# Patient Record
Sex: Male | Born: 1955 | Race: Black or African American | Hispanic: No | Marital: Married | Smoking: Never smoker
Health system: Southern US, Community
[De-identification: ages and names within clinical notes are randomized; demographics above are authoritative.]

## PROBLEM LIST (undated history)

## (undated) DIAGNOSIS — I5022 Chronic systolic (congestive) heart failure: Secondary | ICD-10-CM

## (undated) DIAGNOSIS — I219 Acute myocardial infarction, unspecified: Secondary | ICD-10-CM

## (undated) DIAGNOSIS — Q613 Polycystic kidney, unspecified: Secondary | ICD-10-CM

## (undated) DIAGNOSIS — N184 Chronic kidney disease, stage 4 (severe): Secondary | ICD-10-CM

## (undated) DIAGNOSIS — I10 Essential (primary) hypertension: Secondary | ICD-10-CM

## (undated) DIAGNOSIS — E785 Hyperlipidemia, unspecified: Secondary | ICD-10-CM

## (undated) DIAGNOSIS — F410 Panic disorder [episodic paroxysmal anxiety] without agoraphobia: Secondary | ICD-10-CM

## (undated) DIAGNOSIS — I428 Other cardiomyopathies: Secondary | ICD-10-CM

## (undated) DIAGNOSIS — N2889 Other specified disorders of kidney and ureter: Secondary | ICD-10-CM

## (undated) DIAGNOSIS — I340 Nonrheumatic mitral (valve) insufficiency: Secondary | ICD-10-CM

## (undated) DIAGNOSIS — J449 Chronic obstructive pulmonary disease, unspecified: Secondary | ICD-10-CM

## (undated) DIAGNOSIS — F419 Anxiety disorder, unspecified: Secondary | ICD-10-CM

## (undated) HISTORY — DX: Hyperlipidemia, unspecified: E78.5

## (undated) HISTORY — PX: CARDIAC CATHETERIZATION: SHX172

## (undated) HISTORY — DX: Chronic kidney disease, stage 4 (severe): N18.4

## (undated) HISTORY — DX: Chronic systolic (congestive) heart failure: I50.22

## (undated) HISTORY — DX: Chronic obstructive pulmonary disease, unspecified: J44.9

## (undated) HISTORY — DX: Polycystic kidney, unspecified: Q61.3

---

## 2005-11-06 ENCOUNTER — Emergency Department: Payer: Self-pay | Admitting: Emergency Medicine

## 2005-11-06 ENCOUNTER — Other Ambulatory Visit: Payer: Self-pay

## 2005-11-08 ENCOUNTER — Ambulatory Visit: Payer: Self-pay | Admitting: Nurse Practitioner

## 2005-11-15 ENCOUNTER — Ambulatory Visit: Payer: Self-pay | Admitting: Nurse Practitioner

## 2006-01-03 ENCOUNTER — Emergency Department: Payer: Self-pay | Admitting: Emergency Medicine

## 2006-01-03 ENCOUNTER — Other Ambulatory Visit: Payer: Self-pay

## 2006-03-01 ENCOUNTER — Other Ambulatory Visit: Payer: Self-pay

## 2006-03-01 ENCOUNTER — Emergency Department: Payer: Self-pay | Admitting: Emergency Medicine

## 2006-03-18 ENCOUNTER — Inpatient Hospital Stay: Payer: Self-pay | Admitting: Internal Medicine

## 2006-03-18 ENCOUNTER — Other Ambulatory Visit: Payer: Self-pay

## 2006-03-18 ENCOUNTER — Emergency Department: Payer: Self-pay | Admitting: Emergency Medicine

## 2006-04-23 ENCOUNTER — Other Ambulatory Visit: Payer: Self-pay

## 2006-04-23 ENCOUNTER — Emergency Department: Payer: Self-pay | Admitting: Emergency Medicine

## 2006-05-05 ENCOUNTER — Ambulatory Visit: Payer: Self-pay | Admitting: Internal Medicine

## 2006-05-05 ENCOUNTER — Inpatient Hospital Stay (HOSPITAL_COMMUNITY): Admission: EM | Admit: 2006-05-05 | Discharge: 2006-05-06 | Payer: Self-pay | Admitting: Emergency Medicine

## 2006-10-28 ENCOUNTER — Emergency Department: Payer: Self-pay | Admitting: Unknown Physician Specialty

## 2010-04-16 ENCOUNTER — Emergency Department: Payer: Self-pay | Admitting: Emergency Medicine

## 2011-03-13 ENCOUNTER — Emergency Department: Payer: Self-pay | Admitting: Internal Medicine

## 2012-08-01 ENCOUNTER — Emergency Department: Payer: Self-pay | Admitting: Unknown Physician Specialty

## 2012-08-01 LAB — BASIC METABOLIC PANEL
Anion Gap: 7 (ref 7–16)
BUN: 35 mg/dL — ABNORMAL HIGH (ref 7–18)
Chloride: 110 mmol/L — ABNORMAL HIGH (ref 98–107)
Creatinine: 1.96 mg/dL — ABNORMAL HIGH (ref 0.60–1.30)
Potassium: 4 mmol/L (ref 3.5–5.1)
Sodium: 141 mmol/L (ref 136–145)

## 2012-08-01 LAB — CBC
HCT: 43 % (ref 40.0–52.0)
MCHC: 33.1 g/dL (ref 32.0–36.0)
MCV: 87 fL (ref 80–100)
RBC: 4.94 10*6/uL (ref 4.40–5.90)
RDW: 14.5 % (ref 11.5–14.5)

## 2012-08-01 LAB — HEPATIC FUNCTION PANEL A (ARMC)
SGOT(AST): 42 U/L — ABNORMAL HIGH (ref 15–37)
SGPT (ALT): 41 U/L (ref 12–78)
Total Protein: 7 g/dL (ref 6.4–8.2)

## 2012-08-01 LAB — PRO B NATRIURETIC PEPTIDE: B-Type Natriuretic Peptide: 13255 pg/mL — ABNORMAL HIGH (ref 0–125)

## 2012-08-01 LAB — CK TOTAL AND CKMB (NOT AT ARMC)
CK, Total: 319 U/L — ABNORMAL HIGH (ref 35–232)
CK-MB: 4.9 ng/mL — ABNORMAL HIGH (ref 0.5–3.6)

## 2012-09-20 ENCOUNTER — Emergency Department: Payer: Self-pay | Admitting: Emergency Medicine

## 2012-09-20 LAB — BASIC METABOLIC PANEL
Anion Gap: 8 (ref 7–16)
Co2: 22 mmol/L (ref 21–32)
Creatinine: 2.22 mg/dL — ABNORMAL HIGH (ref 0.60–1.30)

## 2012-09-20 LAB — CBC
MCHC: 31.5 g/dL — ABNORMAL LOW (ref 32.0–36.0)
MCV: 88 fL (ref 80–100)
RBC: 5.34 10*6/uL (ref 4.40–5.90)
WBC: 7.1 10*3/uL (ref 3.8–10.6)

## 2012-09-20 LAB — CK TOTAL AND CKMB (NOT AT ARMC)
CK, Total: 213 U/L (ref 35–232)
CK-MB: 2.9 ng/mL (ref 0.5–3.6)

## 2012-09-20 LAB — TROPONIN I: Troponin-I: <0.02 ng/mL

## 2012-11-12 ENCOUNTER — Emergency Department: Payer: Self-pay | Admitting: Emergency Medicine

## 2012-11-12 LAB — COMPREHENSIVE METABOLIC PANEL
Chloride: 113 mmol/L — ABNORMAL HIGH (ref 98–107)
EGFR (Non-African Amer.): 33 — ABNORMAL LOW
Osmolality: 299 (ref 275–301)
Potassium: 4.1 mmol/L (ref 3.5–5.1)
SGPT (ALT): 25 U/L (ref 12–78)

## 2012-11-12 LAB — CBC
HCT: 44.1 % (ref 40.0–52.0)
MCHC: 32.8 g/dL (ref 32.0–36.0)
MCV: 86 fL (ref 80–100)
RDW: 14.6 % — ABNORMAL HIGH (ref 11.5–14.5)
WBC: 8 10*3/uL (ref 3.8–10.6)

## 2012-11-12 LAB — TROPONIN I: Troponin-I: 0.04 ng/mL

## 2012-11-12 LAB — CK TOTAL AND CKMB (NOT AT ARMC): CK, Total: 103 U/L (ref 35–232)

## 2013-03-03 DIAGNOSIS — F419 Anxiety disorder, unspecified: Secondary | ICD-10-CM | POA: Insufficient documentation

## 2013-03-09 ENCOUNTER — Ambulatory Visit: Payer: Self-pay | Admitting: Cardiology

## 2014-03-01 ENCOUNTER — Encounter (HOSPITAL_COMMUNITY): Payer: Self-pay | Admitting: Emergency Medicine

## 2014-03-01 ENCOUNTER — Emergency Department (HOSPITAL_COMMUNITY): Payer: BC Managed Care – PPO

## 2014-03-01 ENCOUNTER — Emergency Department (HOSPITAL_COMMUNITY)
Admission: EM | Admit: 2014-03-01 | Discharge: 2014-03-01 | Disposition: A | Discharge status: Home / Self Care | Payer: BC Managed Care – PPO | Attending: Emergency Medicine | Admitting: Emergency Medicine

## 2014-03-01 DIAGNOSIS — Z880 Allergy status to penicillin: Secondary | ICD-10-CM | POA: Insufficient documentation

## 2014-03-01 DIAGNOSIS — N289 Disorder of kidney and ureter, unspecified: Secondary | ICD-10-CM | POA: Insufficient documentation

## 2014-03-01 DIAGNOSIS — Z79899 Other long term (current) drug therapy: Secondary | ICD-10-CM | POA: Insufficient documentation

## 2014-03-01 DIAGNOSIS — Z8679 Personal history of other diseases of the circulatory system: Secondary | ICD-10-CM | POA: Insufficient documentation

## 2014-03-01 DIAGNOSIS — Z7982 Long term (current) use of aspirin: Secondary | ICD-10-CM | POA: Insufficient documentation

## 2014-03-01 DIAGNOSIS — F411 Generalized anxiety disorder: Secondary | ICD-10-CM | POA: Insufficient documentation

## 2014-03-01 DIAGNOSIS — F410 Panic disorder [episodic paroxysmal anxiety] without agoraphobia: Secondary | ICD-10-CM

## 2014-03-01 HISTORY — DX: Acute myocardial infarction, unspecified: I21.9

## 2014-03-01 LAB — CBC
HCT: 46.5 % (ref 39.0–52.0)
Hemoglobin: 15.5 g/dL (ref 13.0–17.0)
MCH: 29.8 pg (ref 26.0–34.0)
MCHC: 33.3 g/dL (ref 30.0–36.0)
MCV: 89.4 fL (ref 78.0–100.0)
Platelets: 274 10*3/uL (ref 150–400)
RBC: 5.2 MIL/uL (ref 4.22–5.81)
RDW: 13.4 % (ref 11.5–15.5)
WBC: 7.4 10*3/uL (ref 4.0–10.5)

## 2014-03-01 LAB — BASIC METABOLIC PANEL
BUN: 36 mg/dL — ABNORMAL HIGH (ref 6–23)
CO2: 29 mEq/L (ref 19–32)
Calcium: 10.1 mg/dL (ref 8.4–10.5)
Chloride: 100 mEq/L (ref 96–112)
Creatinine, Ser: 2.24 mg/dL — ABNORMAL HIGH (ref 0.50–1.35)
GFR calc Af Amer: 36 mL/min — ABNORMAL LOW (ref >90)
GFR calc non Af Amer: 31 mL/min — ABNORMAL LOW (ref >90)
Glucose, Bld: 140 mg/dL — ABNORMAL HIGH (ref 70–99)
Potassium: 5.2 mEq/L (ref 3.7–5.3)
Sodium: 145 mEq/L (ref 137–147)

## 2014-03-01 LAB — I-STAT TROPONIN, ED: Troponin i, poc: 0.01 ng/mL (ref 0.00–0.08)

## 2014-03-01 NOTE — ED Provider Notes (Signed)
CSN: [id number]     Arrival date & time 03/01/14  0902 History   First MD Initiated Contact with Patient 03/01/14 [phone]     Chief Complaint  Patient presents with  . Chest Pain     (Consider location/radiation/quality/duration/timing/severity/associated sxs/prior Treatment) HPI Patient presents to the emergency department with palpitations and fluttering in his chest.  The patient, states he has a strong history of anxiety and felt like this was like his previous anxiety attacks.  The patient, states, that he does take medications for anxiety, but felt like his anxieties been increasing over the last couple of days.  The patient, states, that he has not had any shortness of breath, nausea, vomiting, weakness, dizziness, headache, blurred vision, rash, fever, cough, exertional shortness of breath, exertional chest pain, or syncope.  The patient, states, that he feels better.  At this time and is having.  No symptoms.  He did complain of having some tingling in his lips and fingers.  Nothing seemed to make the patient's condition, better or worse.  Patient, states he felt overwhelmed at work.  Due to the fact that his coworker, who helps him was out of work.  He states that he missed the day before, due to his anxiety Past Medical History  Diagnosis Date  . MI (myocardial infarction)   . CHF (congestive heart failure)   . Renal disorder    History reviewed. No pertinent past surgical history. History reviewed. No pertinent family history. History  Substance Use Topics  . Smoking status: Never Smoker   . Smokeless tobacco: Not on file  . Alcohol Use: Yes    Review of Systems   All other systems negative except as documented in the HPI. All pertinent positives and negatives as reviewed in the HPI. Allergies  Penicillins  Home Medications   Prior to Admission medications   Medication Sig Start Date End Date Taking? Authorizing Provider  aspirin EC 81 MG tablet Take 81 mg by mouth  daily.   Yes Historical Provider, MD  enalapril (VASOTEC) 5 MG tablet Take 5 mg by mouth daily.   Yes Historical Provider, MD  furosemide (LASIX) 40 MG tablet Take 80 mg by mouth daily.   Yes Historical Provider, MD  sertraline (ZOLOFT) 25 MG tablet Take 25 mg by mouth daily.   Yes Historical Provider, MD   BP 144/87  Pulse 63  Temp(Src) 98.1 F (36.7 C) (Oral)  Resp 15  Ht 5\' 10"  (1.778 m)  Wt 140 lb (63.504 kg)  BMI 20.09 kg/m2  SpO2 95% Physical Exam  Nursing note and vitals reviewed. Constitutional: He is oriented to person, place, and time. He appears well-developed and well-nourished. No distress.  HENT:  Head: Normocephalic and atraumatic.  Mouth/Throat: Oropharynx is clear and moist.  Eyes: Pupils are equal, round, and reactive to light.  Neck: Normal range of motion. Neck supple.  Cardiovascular: Normal rate, regular rhythm and normal heart sounds.  Exam reveals no gallop and no friction rub.   No murmur heard. Pulmonary/Chest: Effort normal and breath sounds normal. No respiratory distress.  Neurological: He is alert and oriented to person, place, and time. He exhibits normal muscle tone. Coordination normal.  Skin: Skin is warm and dry. No erythema.    ED Course  Procedures (including critical care time) Labs Review Labs Reviewed  BASIC METABOLIC PANEL - Abnormal; Notable for the following:    Glucose, Bld 140 (*)    BUN 36 (*)    Creatinine, Ser 2.24 (*)  GFR calc non Af Amer 31 (*)    GFR calc Af Amer 36 (*)    All other components within normal limits  CBC  I-STAT TROPOININ, ED    Imaging Review Dg Chest 2 View  03/01/2014   CLINICAL DATA:  Panic attack  EXAM: CHEST  2 VIEW  COMPARISON:  04/28/2006  FINDINGS: Cardiomediastinal silhouette is stable. No acute infiltrate or pleural effusion. No pulmonary edema. Bony thorax is unremarkable.  IMPRESSION: No active cardiopulmonary disease.   Electronically Signed   By: Natasha MeadLiviu  Pop M.D.   On: 03/01/2014 11:17      EKG Interpretation   Date/Time:  Tuesday March 01 2014 09:05:40 EDT Ventricular Rate:  71 PR Interval:  183 QRS Duration: 104 QT Interval:  423 QTC Calculation: 460 R Axis:   61 Text Interpretation:  Sinus rhythm Probable left atrial enlargement Left  ventricular hypertrophy Anterior Q waves, possibly due to LVH Abnrm T,  consider ischemia, anterolateral lds st/t wave changes are new in  inferior/lateral leads since prior tracing Confirmed by [facility]  MD, MARTHA  [phone]) on 03/01/2014 3:32:22 PM     The patient does have a cardiac history, but I feel that these symptoms were not related to his cardiac disease.  The patient, states did not feel like his cardiac chest pain, and felt like his anxiety attacks.  The patient is advised return here as needed.  Patient is also given instructions.  Followup with his cardiologist.  He voices an understanding of the plan and all questions were answered and      Carlyle DollyChristopher W  Washabaugh, PA-C 03/03/14 1945

## 2014-03-01 NOTE — Discharge Instructions (Signed)
Return here as needed. Follow up with your doctor. °

## 2014-03-01 NOTE — ED Notes (Signed)
Pt to department via EMS- pt reports that earlier this morning while at work he started having palpitations and fluttering in his chest. Denies any pain, n/v, SOB. Hx of anxiety. Bp-126/76 Hr-76. 02-97. Cbg-123. 324 ASA, 1 nitro with some relief.

## 2014-03-01 NOTE — ED Notes (Signed)
Provider at the bedside.  

## 2014-03-03 NOTE — ED Provider Notes (Signed)
Medical screening examination/treatment/procedure(s) were performed by non-physician practitioner and as supervising physician I was immediately available for consultation/collaboration.   EKG Interpretation   Date/Time:  Tuesday March 01 2014 09:05:40 EDT Ventricular Rate:  71 PR Interval:  183 QRS Duration: 104 QT Interval:  423 QTC Calculation: 460 R Axis:   61 Text Interpretation:  Sinus rhythm Probable left atrial enlargement Left  ventricular hypertrophy Anterior Q waves, possibly due to LVH Abnrm T,  consider ischemia, anterolateral lds st/t wave changes are new in  inferior/lateral leads since prior tracing Confirmed by Karma Ganja  MD, MARTHA  [phone]) on 03/01/2014 3:32:22 PM       Ethelda Chick, MD 03/03/14 2304

## 2015-03-14 ENCOUNTER — Emergency Department
Admission: EM | Admit: 2015-03-14 | Discharge: 2015-03-14 | Disposition: A | Discharge status: Home / Self Care | Payer: No Typology Code available for payment source | Attending: Emergency Medicine | Admitting: Emergency Medicine

## 2015-03-14 ENCOUNTER — Other Ambulatory Visit: Payer: Self-pay

## 2015-03-14 ENCOUNTER — Emergency Department: Payer: No Typology Code available for payment source

## 2015-03-14 ENCOUNTER — Encounter: Payer: Self-pay | Admitting: Emergency Medicine

## 2015-03-14 DIAGNOSIS — Z7982 Long term (current) use of aspirin: Secondary | ICD-10-CM | POA: Insufficient documentation

## 2015-03-14 DIAGNOSIS — F419 Anxiety disorder, unspecified: Secondary | ICD-10-CM | POA: Diagnosis not present

## 2015-03-14 DIAGNOSIS — I10 Essential (primary) hypertension: Secondary | ICD-10-CM | POA: Insufficient documentation

## 2015-03-14 DIAGNOSIS — Z79899 Other long term (current) drug therapy: Secondary | ICD-10-CM | POA: Insufficient documentation

## 2015-03-14 DIAGNOSIS — Z880 Allergy status to penicillin: Secondary | ICD-10-CM | POA: Diagnosis not present

## 2015-03-14 DIAGNOSIS — R079 Chest pain, unspecified: Secondary | ICD-10-CM

## 2015-03-14 DIAGNOSIS — R0789 Other chest pain: Secondary | ICD-10-CM | POA: Diagnosis present

## 2015-03-14 HISTORY — DX: Essential (primary) hypertension: I10

## 2015-03-14 LAB — TROPONIN I

## 2015-03-14 LAB — CBC
HEMATOCRIT: 44 % (ref 40.0–52.0)
Hemoglobin: 14.3 g/dL (ref 13.0–18.0)
MCH: 28.4 pg (ref 26.0–34.0)
MCHC: 32.5 g/dL (ref 32.0–36.0)
MCV: 87.5 fL (ref 80.0–100.0)
Platelets: 196 10*3/uL (ref 150–440)
RBC: 5.03 MIL/uL (ref 4.40–5.90)
RDW: 14.3 % (ref 11.5–14.5)
WBC: 5.7 10*3/uL (ref 3.8–10.6)

## 2015-03-14 LAB — BASIC METABOLIC PANEL
ANION GAP: 12 (ref 5–15)
BUN: 37 mg/dL — AB (ref 6–20)
CALCIUM: 9.2 mg/dL (ref 8.9–10.3)
CHLORIDE: 97 mmol/L — AB (ref 101–111)
CO2: 28 mmol/L (ref 22–32)
Creatinine, Ser: 2.37 mg/dL — ABNORMAL HIGH (ref 0.61–1.24)
GFR, EST AFRICAN AMERICAN: 33 mL/min — AB (ref >60)
GFR, EST NON AFRICAN AMERICAN: 29 mL/min — AB (ref >60)
Glucose, Bld: 88 mg/dL (ref 65–99)
Potassium: 4.3 mmol/L (ref 3.5–5.1)
SODIUM: 137 mmol/L (ref 135–145)

## 2015-03-14 NOTE — Discharge Instructions (Signed)

## 2015-03-14 NOTE — ED Notes (Signed)
Says has numbness inleft arm and chest pain--fullness.

## 2015-03-14 NOTE — ED Provider Notes (Signed)
[facility] Emergency Department Provider Note  ____________________________________________  Time seen: on arrival   I have reviewed the triage vital signs and the nursing notes.   HISTORY  Chief Complaint Chest Pain    HPI Kelly Leonard is a 59 y.o. male who presents with complaints of chest pain. Patient notes approximately 3.5 hours pta he had right arm tingling and felt tightness in his chest. He believes this may be related to an anxiety attack as he has these frequently but he wants to be sure. The tightness resolved after a minute or two. He feels well now. No shortness of breath. No fevers/chills.      Past Medical History  Diagnosis Date  . MI (myocardial infarction)   . CHF (congestive heart failure)   . Renal disorder   . Hypertension     There are no active problems to display for this patient.   No past surgical history on file.  Current Outpatient Rx  Name  Route  Sig  Dispense  Refill  . ALPRAZolam (XANAX) 0.25 MG tablet   Oral   Take 0.25 mg by mouth daily as needed for anxiety.         . Ascorbic Acid (VITAMIN C PO)   Oral   Take 1 tablet by mouth daily.         Marland Kitchen aspirin EC 81 MG tablet   Oral   Take 81 mg by mouth daily.         . B Complex-C (B-COMPLEX WITH VITAMIN C) tablet   Oral   Take 1 tablet by mouth daily.         . Cyanocobalamin (VITAMIN B-12 PO)   Oral   Take 1 tablet by mouth daily.         . furosemide (LASIX) 40 MG tablet   Oral   Take 40 mg by mouth 2 (two) times daily.          Marland Kitchen MAGNESIUM GLUCONATE PO   Oral   Take 1 tablet by mouth daily.         . sertraline (ZOLOFT) 25 MG tablet   Oral   Take 25 mg by mouth daily.           Allergies Penicillins  No family history on file.  Social History History  Substance Use Topics  . Smoking status: Never Smoker   . Smokeless tobacco: Not on file  . Alcohol Use: Yes    Review of Systems  Constitutional: Negative  for fever. Eyes: Negative for visual changes. ENT: Negative for sore throat Cardiovascular: postitivefor chest pain. Respiratory: Negative for shortness of breath. Gastrointestinal: Negative for abdominal pain, vomiting and diarrhea. Genitourinary: Negative for dysuria. Musculoskeletal: Negative for back pain. Skin: Negative for rash. Neurological: Negative for headaches or focal weakness Psychiatric:anxiety  10-point ROS otherwise negative.  ____________________________________________   PHYSICAL EXAM:  VITAL SIGNS: ED Triage Vitals  Enc Vitals Group     BP 03/14/15 1011 145/98 mmHg     Pulse Rate 03/14/15 1011 74     Resp 03/14/15 1011 14     Temp 03/14/15 1011 98 F (36.7 C)     Temp Source 03/14/15 1011 Oral     SpO2 03/14/15 1011 99 %     Weight 03/14/15 1011 150 lb (68.04 kg)     Height --      Head Cir --      Peak Flow --      Pain Score  03/14/15 1007 5     Pain Loc --      Pain Edu? --      Excl. in GC? --      Constitutional: Alert and oriented. Well appearing and in no distress. Eyes: Conjunctivae are normal.  ENT   Head: Normocephalic and atraumatic.   Mouth/Throat: Mucous membranes are moist. Cardiovascular: Normal rate, regular rhythm. Normal and symmetric distal pulses are present in all extremities. No murmurs, rubs, or gallops. Respiratory: Normal respiratory effort without tachypnea nor retractions. Breath sounds are clear and equal bilaterally.  Gastrointestinal: Soft and non-tender in all quadrants. No distention. There is no CVA tenderness. Genitourinary: deferred Musculoskeletal: Nontender with normal range of motion in all extremities. No lower extremity tenderness nor edema. Neurologic:  Normal speech and language. No gross focal neurologic deficits are appreciated. Skin:  Skin is warm, dry and intact. No rash noted. Psychiatric: Mood and affect are normal. Patient exhibits appropriate insight and  judgment.  ____________________________________________    LABS (pertinent positives/negatives)  Labs Reviewed  BASIC METABOLIC PANEL - Abnormal; Notable for the following:    Chloride 97 (*)    BUN 37 (*)    Creatinine, Ser 2.37 (*)    GFR calc non Af Amer 29 (*)    GFR calc Af Amer 33 (*)    All other components within normal limits  TROPONIN I  CBC  TROPONIN I    ____________________________________________   EKG  ED ECG REPORT I, [name], the attending physician, personally viewed and interpreted this ECG.   Date: 03/14/2015  EKG Time: 10:12 AM  Rate: 71  Rhythm: normal sinus rhythm, 1st degree AV block  Axis: Normal axis  Intervals:none  ST&T Change: Nonspecific  Unchanged from prior EKG   ____________________________________________    RADIOLOGY I have personally reviewed any xrays that were ordered on this patient: cxr unremarkable  ____________________________________________   PROCEDURES  Procedure(s) performed: none  Critical Care performed: none  ____________________________________________   INITIAL IMPRESSION / ASSESSMENT AND PLAN / ED COURSE  Pertinent labs & imaging results that were available during my care of the patient were reviewed by me and considered in my medical decision making (see chart for details).  Patient well appearing, benign exam. Labs normal. Trop- x 2. Suspect anxiety attack as cause of discomfort. Recommend close f/u with PCP. Return precautions given.   ____________________________________________   FINAL CLINICAL IMPRESSION(S) / ED DIAGNOSES  Final diagnoses:  Chest pain, unspecified chest pain type     [name], MD 03/14/15 1630

## 2015-06-12 ENCOUNTER — Encounter
Admission: RE | Admit: 2015-06-12 | Discharge: 2015-06-12 | Disposition: A | Discharge status: Home / Self Care | Payer: No Typology Code available for payment source | Source: Ambulatory Visit | Attending: Cardiology | Admitting: Cardiology

## 2015-06-12 DIAGNOSIS — Z01818 Encounter for other preprocedural examination: Secondary | ICD-10-CM | POA: Diagnosis not present

## 2015-06-12 HISTORY — DX: Anxiety disorder, unspecified: F41.9

## 2015-06-12 LAB — DIFFERENTIAL
BASOS PCT: 1 %
Basophils Absolute: 0.1 10*3/uL (ref 0–0.1)
Eosinophils Absolute: 0.6 10*3/uL (ref 0–0.7)
Eosinophils Relative: 9 %
Lymphocytes Relative: 28 %
Lymphs Abs: 1.7 10*3/uL (ref 1.0–3.6)
MONOS PCT: 8 %
Monocytes Absolute: 0.5 10*3/uL (ref 0.2–1.0)
NEUTROS ABS: 3.4 10*3/uL (ref 1.4–6.5)
Neutrophils Relative %: 54 %

## 2015-06-12 LAB — URINALYSIS COMPLETE WITH MICROSCOPIC (ARMC ONLY)
BACTERIA UA: NONE SEEN
Bilirubin Urine: NEGATIVE
Glucose, UA: NEGATIVE mg/dL
Hgb urine dipstick: NEGATIVE
Ketones, ur: NEGATIVE mg/dL
Leukocytes, UA: NEGATIVE
Nitrite: NEGATIVE
PROTEIN: NEGATIVE mg/dL
SPECIFIC GRAVITY, URINE: 1.005 (ref 1.005–1.030)
SQUAMOUS EPITHELIAL / LPF: NONE SEEN
pH: 6 (ref 5.0–8.0)

## 2015-06-12 LAB — CBC
HCT: 48.9 % (ref 40.0–52.0)
HEMOGLOBIN: 16 g/dL (ref 13.0–18.0)
MCH: 28.7 pg (ref 26.0–34.0)
MCHC: 32.6 g/dL (ref 32.0–36.0)
MCV: 88 fL (ref 80.0–100.0)
Platelets: 265 10*3/uL (ref 150–440)
RBC: 5.56 MIL/uL (ref 4.40–5.90)
RDW: 13.8 % (ref 11.5–14.5)
WBC: 6.3 10*3/uL (ref 3.8–10.6)

## 2015-06-12 LAB — PROTIME-INR
INR: 0.94
Prothrombin Time: 12.8 seconds (ref 11.4–15.0)

## 2015-06-12 LAB — BASIC METABOLIC PANEL
Anion gap: 11 (ref 5–15)
BUN: 34 mg/dL — AB (ref 6–20)
CALCIUM: 10.1 mg/dL (ref 8.9–10.3)
CO2: 34 mmol/L — ABNORMAL HIGH (ref 22–32)
Chloride: 98 mmol/L — ABNORMAL LOW (ref 101–111)
Creatinine, Ser: 2.3 mg/dL — ABNORMAL HIGH (ref 0.61–1.24)
GFR calc Af Amer: 34 mL/min — ABNORMAL LOW (ref >60)
GFR, EST NON AFRICAN AMERICAN: 30 mL/min — AB (ref >60)
Glucose, Bld: 106 mg/dL — ABNORMAL HIGH (ref 65–99)
Potassium: 4.7 mmol/L (ref 3.5–5.1)
Sodium: 143 mmol/L (ref 135–145)

## 2015-06-12 LAB — APTT: aPTT: 28 seconds (ref 24–36)

## 2015-06-12 LAB — SURGICAL PCR SCREEN
MRSA, PCR: NEGATIVE
Staphylococcus aureus: NEGATIVE

## 2015-06-12 NOTE — Patient Instructions (Signed)
  Your procedure is scheduled on: 06/23/15 Fri  Report to Day Surgery.2nd floor medical mall To find out your arrival time please call [phone] between 1PM - 3PM on 06/22/15 Thurs Remember: Instructions that are not followed completely may result in serious medical risk, up to and including death, or upon the discretion of your surgeon and anesthesiologist your surgery may need to be rescheduled.    _x___ 1. Do not eat food or drink liquids after midnight. No gum chewing or hard candies.     __x__ 2. No Alcohol for 24 hours before or after surgery.   ____ 3. Bring all medications with you on the day of surgery if instructed.    __x__ 4. Notify your doctor if there is any change in your medical condition     (cold, fever, infections).     Do not wear jewelry, make-up, hairpins, clips or nail polish.  Do not wear lotions, powders, or perfumes. You may wear deodorant.  Do not shave 48 hours prior to surgery. Men may shave face and neck.  Do not bring valuables to the hospital.    [facility] is not responsible for any belongings or valuables.               Contacts, dentures or bridgework may not be worn into surgery.  Leave your suitcase in the car. After surgery it may be brought to your room.  For patients admitted to the hospital, discharge time is determined by your                treatment team.   Patients discharged the day of surgery will not be allowed to drive home.   Please read over the following fact sheets that you were given:      _x__ Take these medicines the morning of surgery with A SIP OF WATER:    1. ALPRAZolam (XANAX) 0.25 MG tablet  2. atenolol (TENORMIN) 25 MG tablet  3. sertraline (ZOLOFT) 25 MG tablet  4.  5.  6.  ____ Fleet Enema (as directed)   _x___ Use CHG Soap as directed  ____ Use inhalers on the day of surgery  ____ Stop metformin 2 days prior to surgery    ____ Take 1/2 of usual insulin dose the night before surgery and none on the  morning of surgery.   _x___ Stop Coumadin/Plavix/aspirin on stop aspirin 1 week before surgery  ____ Stop Anti-inflammatories on    ____ Stop supplements until after surgery.    ____ Bring C-Pap to the hospital.

## 2015-06-23 ENCOUNTER — Ambulatory Visit: Payer: No Typology Code available for payment source | Admitting: Anesthesiology

## 2015-06-23 ENCOUNTER — Observation Stay
Admission: RE | Admit: 2015-06-23 | Discharge: 2015-06-24 | Disposition: A | Discharge status: Home / Self Care | Payer: No Typology Code available for payment source | Source: Ambulatory Visit | Attending: Internal Medicine | Admitting: Internal Medicine

## 2015-06-23 ENCOUNTER — Encounter
Admission: RE | Disposition: A | Discharge status: Home / Self Care | Payer: Self-pay | Source: Ambulatory Visit | Attending: Internal Medicine

## 2015-06-23 ENCOUNTER — Ambulatory Visit: Payer: No Typology Code available for payment source

## 2015-06-23 ENCOUNTER — Encounter: Payer: Self-pay | Admitting: *Deleted

## 2015-06-23 DIAGNOSIS — I440 Atrioventricular block, first degree: Secondary | ICD-10-CM | POA: Insufficient documentation

## 2015-06-23 DIAGNOSIS — Z888 Allergy status to other drugs, medicaments and biological substances status: Secondary | ICD-10-CM | POA: Insufficient documentation

## 2015-06-23 DIAGNOSIS — I10 Essential (primary) hypertension: Secondary | ICD-10-CM | POA: Insufficient documentation

## 2015-06-23 DIAGNOSIS — Z7982 Long term (current) use of aspirin: Secondary | ICD-10-CM | POA: Diagnosis not present

## 2015-06-23 DIAGNOSIS — Z959 Presence of cardiac and vascular implant and graft, unspecified: Secondary | ICD-10-CM

## 2015-06-23 DIAGNOSIS — E785 Hyperlipidemia, unspecified: Secondary | ICD-10-CM | POA: Diagnosis not present

## 2015-06-23 DIAGNOSIS — I420 Dilated cardiomyopathy: Secondary | ICD-10-CM | POA: Diagnosis not present

## 2015-06-23 DIAGNOSIS — R0602 Shortness of breath: Secondary | ICD-10-CM | POA: Diagnosis not present

## 2015-06-23 DIAGNOSIS — Z79899 Other long term (current) drug therapy: Secondary | ICD-10-CM | POA: Diagnosis not present

## 2015-06-23 DIAGNOSIS — I5022 Chronic systolic (congestive) heart failure: Secondary | ICD-10-CM | POA: Diagnosis present

## 2015-06-23 DIAGNOSIS — I340 Nonrheumatic mitral (valve) insufficiency: Secondary | ICD-10-CM | POA: Insufficient documentation

## 2015-06-23 DIAGNOSIS — Z880 Allergy status to penicillin: Secondary | ICD-10-CM | POA: Diagnosis not present

## 2015-06-23 HISTORY — PX: IMPLANTABLE CARDIOVERTER DEFIBRILLATOR (ICD) GENERATOR CHANGE: SHX5469

## 2015-06-23 SURGERY — ICD GENERATOR CHANGE
Anesthesia: Monitor Anesthesia Care | Laterality: Right | Wound class: Clean

## 2015-06-23 MED ORDER — FENTANYL CITRATE (PF) 100 MCG/2ML IJ SOLN
INTRAMUSCULAR | Status: DC | PRN
  Administered 2015-06-23: 50 ug via INTRAVENOUS
  Administered 2015-06-23 (×2): 25 ug via INTRAVENOUS

## 2015-06-23 MED ORDER — ONDANSETRON HCL 4 MG/2ML IJ SOLN: 4.0000 mg | Freq: Once | INTRAMUSCULAR | Status: DC | PRN

## 2015-06-23 MED ORDER — CLINDAMYCIN PHOSPHATE 900 MG/50ML IV SOLN: 900.0000 mg | Freq: Once | INTRAVENOUS | Status: DC

## 2015-06-23 MED ORDER — ASPIRIN EC 81 MG PO TBEC
81.0000 mg | DELAYED_RELEASE_TABLET | Freq: Every day | ORAL | Status: DC
  Administered 2015-06-23 – 2015-06-24 (×2): 81 mg via ORAL
  Filled 2015-06-23 (×2): qty 1

## 2015-06-23 MED ORDER — PROPOFOL 500 MG/50ML IV EMUL
INTRAVENOUS | Status: DC | PRN
  Administered 2015-06-23: 50 ug/kg/min via INTRAVENOUS

## 2015-06-23 MED ORDER — NON FORMULARY: 160.0000 mg | Freq: Every morning | Status: DC

## 2015-06-23 MED ORDER — FENTANYL CITRATE (PF) 100 MCG/2ML IJ SOLN: 25.0000 ug | INTRAMUSCULAR | Status: DC | PRN

## 2015-06-23 MED ORDER — FUROSEMIDE 40 MG PO TABS
40.0000 mg | ORAL_TABLET | Freq: Two times a day (BID) | ORAL | Status: DC
  Administered 2015-06-23 – 2015-06-24 (×2): 40 mg via ORAL
  Filled 2015-06-23 (×2): qty 1

## 2015-06-23 MED ORDER — ONDANSETRON HCL 4 MG/2ML IJ SOLN: 4.0000 mg | Freq: Four times a day (QID) | INTRAMUSCULAR | Status: DC | PRN

## 2015-06-23 MED ORDER — MAGNESIUM GLUCONATE 500 MG PO TABS
250.0000 mg | ORAL_TABLET | Freq: Every day | ORAL | Status: DC
  Administered 2015-06-23: 250 mg via ORAL
  Filled 2015-06-23 (×3): qty 1

## 2015-06-23 MED ORDER — LIDOCAINE 1 % OPTIME INJ - NO CHARGE
INTRAMUSCULAR | Status: DC | PRN
  Administered 2015-06-23: 20 mL

## 2015-06-23 MED ORDER — FAMOTIDINE 20 MG PO TABS
20.0000 mg | ORAL_TABLET | Freq: Once | ORAL | Status: AC
  Administered 2015-06-23: 20 mg via ORAL

## 2015-06-23 MED ORDER — LACTATED RINGERS IV SOLN
INTRAVENOUS | Status: DC
  Administered 2015-06-23: 11:00:00 via INTRAVENOUS

## 2015-06-23 MED ORDER — HYDROCODONE-ACETAMINOPHEN 5-325 MG PO TABS
1.0000 | ORAL_TABLET | ORAL | Status: DC | PRN
  Administered 2015-06-23 – 2015-06-24 (×3): 1 via ORAL
  Filled 2015-06-23 (×3): qty 1

## 2015-06-23 MED ORDER — ADULT MULTIVITAMIN W/MINERALS CH
1.0000 | ORAL_TABLET | Freq: Every day | ORAL | Status: DC
  Administered 2015-06-23 – 2015-06-24 (×2): 1 via ORAL
  Filled 2015-06-23 (×3): qty 1

## 2015-06-23 MED ORDER — CLINDAMYCIN PHOSPHATE 900 MG/50ML IV SOLN
INTRAVENOUS | Status: AC
  Administered 2015-06-23: 900 mg via INTRAVENOUS
  Filled 2015-06-23: qty 50

## 2015-06-23 MED ORDER — SERTRALINE HCL 25 MG PO TABS
25.0000 mg | ORAL_TABLET | Freq: Every day | ORAL | Status: DC
  Administered 2015-06-24: 25 mg via ORAL
  Filled 2015-06-23: qty 1

## 2015-06-23 MED ORDER — ALPRAZOLAM 0.5 MG PO TABS: 0.2500 mg | ORAL_TABLET | Freq: Every day | ORAL | Status: DC | PRN

## 2015-06-23 MED ORDER — VANCOMYCIN HCL IN DEXTROSE 1-5 GM/200ML-% IV SOLN
1000.0000 mg | Freq: Once | INTRAVENOUS | Status: AC
  Administered 2015-06-23: 1000 mg via INTRAVENOUS

## 2015-06-23 MED ORDER — ACETAMINOPHEN 325 MG PO TABS
325.0000 mg | ORAL_TABLET | ORAL | Status: DC | PRN
  Administered 2015-06-23: 650 mg via ORAL
  Filled 2015-06-23: qty 2

## 2015-06-23 MED ORDER — SODIUM CHLORIDE 0.9 % IR SOLN
Status: DC | PRN
  Administered 2015-06-23: 60 mL

## 2015-06-23 MED ORDER — FAMOTIDINE 20 MG PO TABS
ORAL_TABLET | ORAL | Status: AC
  Administered 2015-06-23: 20 mg via ORAL
  Filled 2015-06-23: qty 1

## 2015-06-23 MED ORDER — CARVEDILOL 12.5 MG PO TABS
6.2500 mg | ORAL_TABLET | Freq: Two times a day (BID) | ORAL | Status: DC
  Administered 2015-06-23 – 2015-06-24 (×2): 6.25 mg via ORAL
  Filled 2015-06-23 (×2): qty 1

## 2015-06-23 MED ORDER — HEPARIN SODIUM (PORCINE) 1000 UNIT/ML IJ SOLN
INTRAMUSCULAR | Status: DC | PRN
  Administered 2015-06-23: 10 mL via INTRAMUSCULAR

## 2015-06-23 MED ORDER — IRBESARTAN 150 MG PO TABS: 150.0000 mg | ORAL_TABLET | Freq: Every day | ORAL | Status: DC

## 2015-06-23 MED ORDER — IRBESARTAN 150 MG PO TABS
150.0000 mg | ORAL_TABLET | Freq: Every day | ORAL | Status: DC
  Administered 2015-06-24: 150 mg via ORAL
  Filled 2015-06-23: qty 1

## 2015-06-23 MED ORDER — MIDAZOLAM HCL 2 MG/2ML IJ SOLN
INTRAMUSCULAR | Status: DC | PRN
  Administered 2015-06-23: 2 mg via INTRAVENOUS

## 2015-06-23 MED ORDER — LACTATED RINGERS IV SOLN
INTRAVENOUS | Status: DC
  Administered 2015-06-23 – 2015-06-24 (×2): via INTRAVENOUS

## 2015-06-23 MED ORDER — SODIUM CHLORIDE 0.9 % IR SOLN
Freq: Once | Status: DC
  Filled 2015-06-23: qty 2

## 2015-06-23 MED ORDER — VANCOMYCIN HCL IN DEXTROSE 1-5 GM/200ML-% IV SOLN
INTRAVENOUS | Status: AC
  Administered 2015-06-23: 1000 mg via INTRAVENOUS
  Filled 2015-06-23: qty 200

## 2015-06-23 SURGICAL SUPPLY — 46 items
BAG DECANTER FOR FLEXI CONT (MISCELLANEOUS) ×2 IMPLANT
BLADE CLIPPER SURG (BLADE) ×1 IMPLANT
CABLE SURG 12 DISP A/V CHANNEL (MISCELLANEOUS) ×1 IMPLANT
CANISTER SUCT 1200ML W/VALVE (MISCELLANEOUS) ×2 IMPLANT
CHLORAPREP W/TINT 26ML (MISCELLANEOUS) ×2 IMPLANT
COVER LIGHT HANDLE STERIS (MISCELLANEOUS) ×4 IMPLANT
COVER MAYO STAND STRL (DRAPES) ×2 IMPLANT
COVER PROBE FLX POLY STRL (MISCELLANEOUS) ×1 IMPLANT
DRAPE C-ARM XRAY 36X54 (DRAPES) ×2 IMPLANT
DRESSING TELFA 4X3 1S ST N-ADH (GAUZE/BANDAGES/DRESSINGS) ×2 IMPLANT
DRSG TEGADERM 4X4.75 (GAUZE/BANDAGES/DRESSINGS) ×2 IMPLANT
GLOVE BIO SURGEON STRL SZ7.5 (GLOVE) ×3 IMPLANT
GLOVE BIOGEL PI IND STRL 7.0 (GLOVE) IMPLANT
GLOVE BIOGEL PI INDICATOR 7.0 (GLOVE) ×1
GOWN STRL REUS W/ TWL LRG LVL3 (GOWN DISPOSABLE) ×1 IMPLANT
GOWN STRL REUS W/ TWL XL LVL3 (GOWN DISPOSABLE) ×1 IMPLANT
GOWN STRL REUS W/TWL LRG LVL3 (GOWN DISPOSABLE) ×4
GOWN STRL REUS W/TWL XL LVL3 (GOWN DISPOSABLE) ×2
HANDLE YANKAUER SUCT BULB TIP (MISCELLANEOUS) ×1 IMPLANT
ICD VISIA MRI VR DVFB1D4 (ICD Generator) IMPLANT
IMMOBILIZER SHDR LG LX 900803 (SOFTGOODS) ×2 IMPLANT
IV NS 1000ML (IV SOLUTION) ×2
IV NS 1000ML BAXH (IV SOLUTION) ×1 IMPLANT
KIT RM TURNOVER STRD PROC AR (KITS) ×2 IMPLANT
LABEL OR SOLS (LABEL) ×2 IMPLANT
LEAD SPRINT QUAT SEC 6935M-62 (Lead) ×1 IMPLANT
NDL FILTER BLUNT 18X1 1/2 (NEEDLE) ×1 IMPLANT
NDL HYPO 25X1 1.5 SAFETY (NEEDLE) ×1 IMPLANT
NDL SPNL 22GX3.5 QUINCKE BK (NEEDLE) ×1 IMPLANT
NEEDLE FILTER BLUNT 18X 1/2SAF (NEEDLE) ×1
NEEDLE FILTER BLUNT 18X1 1/2 (NEEDLE) ×1 IMPLANT
NEEDLE HYPO 25X1 1.5 SAFETY (NEEDLE) ×2 IMPLANT
NEEDLE SPNL 22GX3.5 QUINCKE BK (NEEDLE) ×2 IMPLANT
NS IRRIG 500ML POUR BTL (IV SOLUTION) ×2 IMPLANT
PACK PACE INSERTION (MISCELLANEOUS) ×2 IMPLANT
PAD GROUND ADULT SPLIT (MISCELLANEOUS) ×2 IMPLANT
PAD STATPAD (MISCELLANEOUS) ×2 IMPLANT
SUT DVC V-LOC 4-0 90 CLR P-12 (SUTURE) ×4
SUT DVC VLOC 3-0 CL 6 P-12 (SUTURE) ×2 IMPLANT
SUT SILK 0 CT 1 30 (SUTURE) ×2 IMPLANT
SUT VIC AB 3-0 PS2 18 (SUTURE) ×2 IMPLANT
SUTURE DVC V-LC4-0 90 CLR P-12 (SUTURE) ×1 IMPLANT
SYR CONTROL 10ML (SYRINGE) ×2 IMPLANT
SYRINGE 10CC LL (SYRINGE) ×2 IMPLANT
TOWEL OR 17X26 4PK STRL BLUE (TOWEL DISPOSABLE) ×2 IMPLANT
VISIA MRI VR DVFB1D4 (ICD Generator) ×2 IMPLANT

## 2015-06-23 NOTE — Anesthesia Postprocedure Evaluation (Signed)
  Anesthesia Post-op Note  Patient: Kelly Leonard  Procedure(s) Performed: Procedure(s): ICD GENERATOR  INITIAL IMPLANT (Right)  Anesthesia type:MAC  Patient location: PACU  Post pain: Pain level controlled  Post assessment: Post-op Vital signs reviewed, Patient's Cardiovascular Status Stable, Respiratory Function Stable, Patent Airway and No signs of Nausea or vomiting  Post vital signs: Reviewed and stable  Last Vitals:  Filed Vitals:   06/23/15 1345  BP: 118/82  Pulse: 47  Temp:   Resp: 12    Level of consciousness: awake, alert  and patient cooperative  Complications: No apparent anesthesia complications

## 2015-06-23 NOTE — Brief Op Note (Signed)
06/23/2015  12:07 PM   Kelly Leonard [id number]  [id number]  Preop LE:[id number] systolic HF Postop Dx same, s/p Single chamber ICD in situ   Procedure: single chamber ICD implantation with  Following the obtaining of informed consent the patient was brought to the electrophysiology laboratory in place of the fluoroscopic table in the supine position.After routine prep and drape, lidocaine was infiltrated in the prepectoral subclavicular region and an incision was made and carried down to the layer of the prepectoral fascia using electrocautery and sharp dissection. A pocket was formed similarly.  Thereafter an attention was turned to gain access to the extrathoracic left axillary vein which was accomplished without difficulty and without the aspiration of air or puncture of the artery. A 9 French sheath was placed for which was then passed a single coil  active fixation defibrillator lead, model 6935M serial number [id number]. It was  passed under fluoroscopic guidance to the right ventricular apex. In its location the bipolar R wave was 9.1 millivolts, impedance was 533 ohms, the pacing threshold was 0.4 volts at 0.8msec.  There was no diaphragmatic pacing at 10 V. The current of injury was brisk.  The lead was secured to the prepectoral fascia and then attached to a Medtronic ICD, serial number  [id number].  Through the device, the bipolar R wave was 9.3 millivolts, impedance was 437 ohms, the pacing threshold was 0.5 volts at 0.4 msec. High-voltage impedance was 84 ohms.  The pocket was copiously irrigated with antibiotic containing saline solution. Hemostasis was assured, and the device and the lead was placed in the pocket and secured to the prepectoral fascia.  The wound was closed in 2.0 vicryl and 3.0 and 4.0 v lock   layers in normal fashion. The wound was washed dried and  benzoin Steri-Strips dressing was then applied. Needle counts sponge counts and instrument counts were correct at  the end of the procedure according to the staff.   Patient tolerated the procedure without apparent complication  EBL  Minimal   Fluoro time 2.21 minutes     [name], MD 06/23/2015 12:07 PM

## 2015-06-23 NOTE — Transfer of Care (Signed)
Immediate Anesthesia Transfer of Care Note  Patient: Kelly Leonard  Procedure(s) Performed: Procedure(s): ICD GENERATOR  INITIAL IMPLANT (Right)  Patient Location: PACU  Anesthesia Type:MAC  Level of Consciousness: awake, alert , oriented and patient cooperative  Airway & Oxygen Therapy: Patient Spontanous Breathing  Post-op Assessment: Report given to RN  Post vital signs: Reviewed and stable  Last Vitals:  Filed Vitals:   06/23/15 1334  BP: 126/84  Pulse: 52  Temp: 36.1 C  Resp: 19    Complications: No apparent anesthesia complications

## 2015-06-23 NOTE — H&P (Signed)
Referring Provider:  Jamse Mead    Primary Care Provider: Chinita Pester, MD [address] [place] [address]   Patient Profile:   Kelly Leonard is a very pleasant 59 y.o. male who I was asked to see for consideration of a primary prevention ICD in a patient with EF 30%.   Problem List:  Problem List  Date Reviewed: 06-25-15      Codes Priority Class Noted - Resolved   Dilated cardiomyopathy ICD-10-CM: I42.0 ICD-9-CM: 425.4   07/28/2013 - Present   Overview Signed 07/28/2013 2:55 PM by Katharina Caper, RN    EF 30% by cath 03-09-13     Hypertension ICD-10-CM: I10 ICD-9-CM: 401.9   07/28/2013 - Present   Hyperlipidemia ICD-10-CM: I09.7 ICD-9-CM: 272.4   07/28/2013 - Present       Current Medications:   Current Medications         Current Outpatient Prescriptions  Medication Sig Dispense Refill  . ALPRAZolam (XANAX) 0.25 MG tablet Take by mouth.    Marland Kitchen aspirin 81 MG EC tablet Take 81 mg by mouth.    . carvedilol (COREG) 6.25 MG tablet Take 6.25 mg by mouth 2 (two) times daily with meals.     . FUROsemide (LASIX) 40 MG tablet Take 40 mg by mouth 2 (two) times daily.     . sertraline (ZOLOFT) 25 MG tablet Take 25 mg by mouth once daily.    . valsartan (DIOVAN) 160 MG tablet Take 160 mg by mouth once daily.     No current facility-administered medications for this visit.        Allergies:  Ace inhibitors and Penicillins  History of Present Illness   Kelly Leonard is a very pleasant 59 y.o. male with NICM. The patient has had shortness of breath, has not had chest pain, has not had near syncope, has not syncope, and has not had palpitations. The patient has had orthopnea or PND. No recent hospitalizations   Family History   Patient denies any family history of sudden cardiac death.  Social History    Social History   Social History         Social History  . Marital status: Single    Spouse name: N/A  . Number of children: N/A  . Years of education: N/A      Occupational History  . Not on file.          Social History Main Topics   . Smoking status: Never Smoker   . Smokeless tobacco: Not on file   . Alcohol use 7.2 - 8.4 oz/week    12 - 14 Standard drinks or equivalent per week     Comment: 1 to 2 everyotherday   . Drug use: No   . Sexual activity: Not on file        Other Topics Concern  . Not on file   Social History Narrative      Review of Systems   14 systems reviewed pertinent positives and negatives as noted in the HPI and below.  Physical Examinations   Vital Signs:      Visit Vitals  . BP 142/86 (BP Location: Left upper arm, Patient Position: Sitting, BP Cuff Size: Adult)  . Ht 177.8 cm (5\' 10" )  . Wt 66.4 kg (146 lb 6 oz)  . SpO2 100%  . BMI 21 kg/m2   Body mass index is 21 kg/(m^2). General appearance: He appears well, in no apparent distress. Alert and oriented  times three.   Neck: supple, no significant adenopathy, carotids upstroke normal bilaterally, no bruits   Thyroid: thyroid is normal in size without nodules or tenderness   Lungs: clear to auscultation, no wheezes, rales or rhonchi, symmetric air entry   CV: normal rate, regular rhythm, normal S1, S2, no murmurs, rubs, clicks or gallops, normal rate and regular rhythm, systolic murmur 1 /6 at lower left sternal border  Abdomen: soft, nontender, nondistended, no masses or organomegaly   Neurological: alert, oriented, normal speech, no focal findings or movement disorder noted   Musculoskeletal: full range of motion without pain, moves all extremities. Ambulates without difficulty.   Extremities: no pedal edema, no clubbing or cyanosis   ECG  Rhythm: normal sinus rhythm, rate=66 bpm, QTc=432ms. QRS=100 LVH repo  Echocardiogram  Date:  12/22/2014 ECHOCARDIOGRAPHIC MEASUREMENTS 2D DIMENSIONS AORTA       Values   Normal Range   MAIN PA     Values   Normal Range     Annulus: nm*    [2.3 - 2.9]        PA Main: nm*    [1.5 - 2.1]    Aorta Sin: nm*    [3.1 - 3.7]    RIGHT VENTRICLE   ST Junction: nm*    [2.6 - 3.2]        RV Base: 3.9 cm  [ < 4.2]    Asc.Aorta: nm*    [2.6 - 3.4]         RV Mid: nm*    [ < 3.5] LEFT VENTRICLE                    RV Length: nm*    [ < 8.6]      LVIDd: 6.3 cm  [4.2 - 5.9]    INFERIOR VENA CAVA      LVIDs: 5.5 cm               Max. IVC: nm*    [ <= 2.1]        FS: 12.0 %  [> 25]          Min. IVC: nm*       SWT: 0.77 cm  [0.6 - 1.0]          ------------------       PWT: 0.83 cm  [0.6 - 1.0]          nm* - not measured LEFT ATRIUM     LA Diam: 4.4 cm  [3.0 - 4.0]   LA A4C Area: nm*    [ < 20]    LA Volume: nm*    [18 - 58]  ___________________________________________________________________________________________ ECHOCARDIOGRAPHIC DESCRIPTIONS  AORTIC ROOT    Size:Normal Dissection:No dissection  AORTIC VALVE  Leaflets:Tricuspid        Morphology:Normal  Mobility:Fully mobile  LEFT VENTRICLE    Size:MODERATELY ENLARGED    Anterior:HYPOCONTRACTILE Contraction:SEVERE GLOBAL DECREASE   Lateral:HYPOCONTRACTILE Closest EF:25% (Estimated)       Septal:HYPOCONTRACTILE  LV Masses:No Masses          Apical:HYPOCONTRACTILE     ZOX:WRUE           Inferior:HYPOCONTRACTILE                   Posterior:HYPOCONTRACTILE Dias.FxClass:(Grade 1) relaxation abnormal, E/A reversal  MITRAL VALVE  Leaflets:Normal          Mobility:Fully  mobile Morphology:Normal  LEFT ATRIUM    Size:MILDLY ENLARGED  LA Masses:No masses  IA Septum:Normal IAS  MAIN PA    Size:Normal  PULMONIC VALVE Morphology:Normal          Mobility:Fully mobile  RIGHT VENTRICLE  RV Masses:No Masses           Size:Normal  Free Wall:Normal         Contraction:Normal  TRICUSPID VALVE  Leaflets:Normal          Mobility:Fully mobile Morphology:Normal  RIGHT ATRIUM    Size:Normal          RA Other:None   RA Mass:No masses  PERICARDIUM    Fluid:No effusion  INFERIOR VENACAVA    Size:Normal Normal respiratory collapse   ____________________________________________________________________ DOPPLER ECHO and OTHER SPECIAL PROCEDURES  Aortic:TRIVIAL AR          No AS     102.0 cm/sec peak vel     4.2 mmHg peak grad     2.0 mmHg mean grad      2.3 cm^2 by DOPPLER   Mitral:MODERATE MR          No MS                    4.4 cm^2 by DOPPLER     MV Inflow E Vel=79.0 cm/sec  MV Annulus E'Vel=4.7 cm/sec     E/E'Ratio=16.8  Tricuspid:MILD TR            No TS     286.0 cm/sec peak TR vel   42.7 mmHg peak RV pressure  Pulmonary:TRIVIAL PR          No PS     ___________________________________________________________________________________________ INTERPRETATION MODERATE-TO-SEVERE LV SYSTOLIC DYSFUNCTION WITH AN ESTIMATED EF = 25-30 % NORMAL RIGHT VENTRICULAR SYSTOLIC FUNCTION MODERATE MITRAL VALVE INSUFFICIENCY MILD TRICUSPID VALVE INSUFFICIENCY TRACE AORTIC VALVE INSUFFICIENCY NO VALVULAR STENOSIS MODERATE LV ENLARGEMENT MILD LA ENLARGEMENT Labs   Hematology No results found for: WBC, HGB, HCT, MCV, PLT  Chemistries No results found for: CREATININE, BUN, NA, K, CL, CO2  Liver Function Studies No results found  for: ALT, AST, GGT, ALKPHOS, BILITOT  Thyroid Function Studies No results found for: TSH, T3TOTAL, T4TOTAL, THYROIDAB  INR No results found for: INR  Assessment and Plan  Given the patient's EF 30%, he is at an increased risk of sudden cardiac death and will likely benefit from an ICD. I spent some time with him reviewing the potential benefits and risks of the procedure. Specifically, I informed him that the potential risks include but are not limited to infection, bleeding, pneumothorax, and cardiac perforation with or without tamponade. He verbalized understanding, and he signed the consent form. His procedure is scheduled for Oct 14. Marland Kitchen He has  been n.p.o. after midnight. We plan to implant a single-chamber single coil ICD in the left infraclavicular region, and I plan not to do DFT testing. I plan to give him vancomycin and clindamycin before his procedure ()PCN allergy.

## 2015-06-23 NOTE — Progress Notes (Signed)
Patient 's pain unrelieved with Tylenol. Dr. Juliann Pares paged 2 times with no call back. Dr. Clent Ridges was paged with a order new order for Hydrocodone 5-325  Mg 1-2 tablets every 4 hrs PRN.

## 2015-06-23 NOTE — Progress Notes (Signed)
Per P&T committee, valsartan is to be automatically subsituted with irbesartan. Per policy pt valsartan 160mg  was auto subbed for irbesartan 150mg  Kelly Leonard D Haven Pylant, Pharm.D Clinical Pharmacist

## 2015-06-23 NOTE — Plan of Care (Signed)
Problem: Phase I Progression Outcomes Goal: Other Phase I Outcomes/Goals Outcome: Completed/Met Date Met:  06/23/15 Pt is alert and oriented x 4, pt transferred from PACU for ICD placement, significant other at bedside, on room air, c/o mild pain in left shoulder but declines need for medication, good appetite, up to bathroom with one person assist, ICD booklet provided to patient, pt is likely to d/c tomorrow. Uneventful shift.

## 2015-06-23 NOTE — Anesthesia Preprocedure Evaluation (Signed)
Anesthesia Evaluation  Patient identified by MRN, date of birth, ID band Patient awake    Reviewed: Allergy & Precautions, NPO status , Patient's Chart, lab work & pertinent test results  History of Anesthesia Complications Negative for: history of anesthetic complications  Airway Mallampati: II   Neck ROM: Full    Dental  (+) Chipped, Loose   Pulmonary neg pulmonary ROS,           Cardiovascular hypertension, Pt. on medications and Pt. on home beta blockers + Past MI and +CHF       Neuro/Psych negative neurological ROS     GI/Hepatic negative GI ROS, Neg liver ROS, GERD  Poorly Controlled,  Endo/Other  negative endocrine ROS  Renal/GU Renal Insufficiency     Musculoskeletal   Abdominal   Peds  Hematology negative hematology ROS (+)   Anesthesia Other Findings   Reproductive/Obstetrics                             Anesthesia Physical Anesthesia Plan  ASA: III  Anesthesia Plan: MAC   Post-op Pain Management:    Induction: Intravenous  Airway Management Planned: Nasal Cannula  Additional Equipment:   Intra-op Plan:   Post-operative Plan:   Informed Consent: I have reviewed the patients History and Physical, chart, labs and discussed the procedure including the risks, benefits and alternatives for the proposed anesthesia with the patient or authorized representative who has indicated his/her understanding and acceptance.     Plan Discussed with:   Anesthesia Plan Comments:         Anesthesia Quick Evaluation

## 2015-06-24 ENCOUNTER — Observation Stay: Payer: No Typology Code available for payment source

## 2015-06-24 DIAGNOSIS — I5022 Chronic systolic (congestive) heart failure: Secondary | ICD-10-CM | POA: Diagnosis not present

## 2015-06-24 NOTE — Progress Notes (Signed)
EKG completed and printout placed on pt's chart

## 2015-06-24 NOTE — Progress Notes (Signed)
Discharge instructions given to patient and significant other. No further questions. Will follow up with Dr. Darrold Junker, primary cardiologist. IV and tele discontinued.

## 2015-06-30 DIAGNOSIS — Z95810 Presence of automatic (implantable) cardiac defibrillator: Secondary | ICD-10-CM | POA: Insufficient documentation

## 2015-10-13 ENCOUNTER — Ambulatory Visit: Payer: Self-pay | Admitting: Family Medicine

## 2015-12-08 ENCOUNTER — Encounter: Payer: Self-pay | Admitting: Emergency Medicine

## 2015-12-08 ENCOUNTER — Emergency Department
Admission: EM | Admit: 2015-12-08 | Discharge: 2015-12-08 | Disposition: A | Discharge status: Home / Self Care | Payer: BLUE CROSS/BLUE SHIELD | Attending: Emergency Medicine | Admitting: Emergency Medicine

## 2015-12-08 DIAGNOSIS — N189 Chronic kidney disease, unspecified: Secondary | ICD-10-CM

## 2015-12-08 DIAGNOSIS — A0811 Acute gastroenteropathy due to Norwalk agent: Secondary | ICD-10-CM | POA: Insufficient documentation

## 2015-12-08 DIAGNOSIS — R7989 Other specified abnormal findings of blood chemistry: Secondary | ICD-10-CM

## 2015-12-08 DIAGNOSIS — Z880 Allergy status to penicillin: Secondary | ICD-10-CM | POA: Insufficient documentation

## 2015-12-08 DIAGNOSIS — I129 Hypertensive chronic kidney disease with stage 1 through stage 4 chronic kidney disease, or unspecified chronic kidney disease: Secondary | ICD-10-CM | POA: Insufficient documentation

## 2015-12-08 DIAGNOSIS — R778 Other specified abnormalities of plasma proteins: Secondary | ICD-10-CM

## 2015-12-08 DIAGNOSIS — E860 Dehydration: Secondary | ICD-10-CM | POA: Diagnosis not present

## 2015-12-08 DIAGNOSIS — R1110 Vomiting, unspecified: Secondary | ICD-10-CM | POA: Diagnosis present

## 2015-12-08 LAB — CBC
HCT: 48.7 % (ref 40.0–52.0)
HEMOGLOBIN: 16.4 g/dL (ref 13.0–18.0)
MCH: 28.5 pg (ref 26.0–34.0)
MCHC: 33.6 g/dL (ref 32.0–36.0)
MCV: 84.7 fL (ref 80.0–100.0)
PLATELETS: 228 10*3/uL (ref 150–440)
RBC: 5.75 MIL/uL (ref 4.40–5.90)
RDW: 14 % (ref 11.5–14.5)
WBC: 6.1 10*3/uL (ref 3.8–10.6)

## 2015-12-08 LAB — BASIC METABOLIC PANEL
ANION GAP: 5 (ref 5–15)
Anion gap: 7 (ref 5–15)
BUN: 56 mg/dL — ABNORMAL HIGH (ref 6–20)
BUN: 53 mg/dL — ABNORMAL HIGH (ref 6–20)
CALCIUM: 9 mg/dL (ref 8.9–10.3)
CALCIUM: 7.7 mg/dL — AB (ref 8.9–10.3)
CO2: 24 mmol/L (ref 22–32)
CO2: 22 mmol/L (ref 22–32)
CREATININE: 3.37 mg/dL — AB (ref 0.61–1.24)
Chloride: 107 mmol/L (ref 101–111)
Chloride: 108 mmol/L (ref 101–111)
Creatinine, Ser: 2.89 mg/dL — ABNORMAL HIGH (ref 0.61–1.24)
GFR, EST AFRICAN AMERICAN: 21 mL/min — AB (ref >60)
GFR, EST AFRICAN AMERICAN: 26 mL/min — AB (ref >60)
GFR, EST NON AFRICAN AMERICAN: 19 mL/min — AB (ref >60)
GFR, EST NON AFRICAN AMERICAN: 22 mL/min — AB (ref >60)
Glucose, Bld: 93 mg/dL (ref 65–99)
Glucose, Bld: 84 mg/dL (ref 65–99)
POTASSIUM: 3.9 mmol/L (ref 3.5–5.1)
Potassium: 4.5 mmol/L (ref 3.5–5.1)
SODIUM: 138 mmol/L (ref 135–145)
SODIUM: 135 mmol/L (ref 135–145)

## 2015-12-08 LAB — TROPONIN I
TROPONIN I: 0.05 ng/mL — AB (ref <0.031)
TROPONIN I: 0.05 ng/mL — AB (ref <0.031)

## 2015-12-08 MED ORDER — ONDANSETRON HCL 4 MG PO TABS: 4.0000 mg | ORAL_TABLET | Freq: Every day | ORAL | Status: DC | PRN

## 2015-12-08 MED ORDER — ONDANSETRON HCL 4 MG/2ML IJ SOLN
4.0000 mg | Freq: Once | INTRAMUSCULAR | Status: AC
  Administered 2015-12-08: 4 mg via INTRAVENOUS
  Filled 2015-12-08: qty 2

## 2015-12-08 MED ORDER — LORAZEPAM 2 MG/ML IJ SOLN
1.0000 mg | Freq: Once | INTRAMUSCULAR | Status: AC
  Administered 2015-12-08: 1 mg via INTRAVENOUS
  Filled 2015-12-08: qty 1

## 2015-12-08 MED ORDER — LOPERAMIDE HCL 2 MG PO CAPS
4.0000 mg | ORAL_CAPSULE | Freq: Once | ORAL | Status: AC
  Administered 2015-12-08: 4 mg via ORAL
  Filled 2015-12-08: qty 2

## 2015-12-08 MED ORDER — LOPERAMIDE HCL 2 MG PO TABS: 2.0000 mg | ORAL_TABLET | Freq: Four times a day (QID) | ORAL | Status: DC | PRN

## 2015-12-08 MED ORDER — SODIUM CHLORIDE 0.9 % IV SOLN
Freq: Once | INTRAVENOUS | Status: AC
  Administered 2015-12-08: 11:00:00 via INTRAVENOUS

## 2015-12-08 NOTE — Discharge Instructions (Signed)
Norovirus Infection A norovirus infection is caused by exposure to a virus in a group of similar viruses (noroviruses). This type of infection causes inflammation in your stomach and intestines (gastroenteritis). Norovirus is the most common cause of gastroenteritis. It also causes food poisoning. Anyone can get a norovirus infection. It spreads very easily (contagious). You can get it from contaminated food, water, surfaces, or other people. Norovirus is found in the stool or vomit of infected people. You can spread the infection as soon as you feel sick until 2 weeks after you recover.  Symptoms usually begin within 2 days after you become infected. Most norovirus symptoms affect the digestive system. CAUSES Norovirus infection is caused by contact with norovirus. You can catch norovirus if you:  Eat or drink something contaminated with norovirus.  Touch surfaces or objects contaminated with norovirus and then put your hand in your mouth.  Have direct contact with an infected person who has symptoms.  Share food, drink, or utensils with someone with who is sick with norovirus. SIGNS AND SYMPTOMS Symptoms of norovirus may include:  Nausea.  Vomiting.  Diarrhea.  Stomach cramps.  Fever.  Chills.  Headache.  Muscle aches.  Tiredness. DIAGNOSIS Your health care provider may suspect norovirus based on your symptoms and physical exam. Your health care provider may also test a sample of your stool or vomit for the virus.  TREATMENT There is no specific treatment for norovirus. Most people get better without treatment in about 2 days. HOME CARE INSTRUCTIONS  Replace lost fluids by drinking plenty of water or rehydration fluids containing important minerals called electrolytes. This prevents dehydration. Drink enough fluid to keep your urine clear or pale yellow.  Do not prepare food for others while you are infected. Wait at least 3 days after recovering from the illness to do  that. PREVENTION   Wash your hands often, especially after using the toilet or changing a diaper.  Wash fruits and vegetables thoroughly before preparing or serving them.  Throw out any food that a sick person may have touched.  Disinfect contaminated surfaces immediately after someone in the household has been sick. Use a bleach-based household cleaner.  Immediately remove and wash soiled clothes or sheets. SEEK MEDICAL CARE IF:  Your vomiting, diarrhea, and stomach pain is getting worse.  Your symptoms of norovirus do not go away after 2-3 days. SEEK IMMEDIATE MEDICAL CARE IF:  You develop symptoms of dehydration that do not improve with fluid replacement. This may include:  Excessive sleepiness.  Lack of tears.  Dry mouth.  Dizziness when standing.  Weak pulse.   This information is not intended to replace advice given to you by your health care provider. Make sure you discuss any questions you have with your health care provider.   Document Released: 11/16/2002 Document Revised: 09/16/2014 Document Reviewed: 02/03/2014 Elsevier Interactive Patient Education 2016 Else[employer].  Chronic Kidney Disease Chronic kidney disease occurs when the kidneys are damaged over a long period. The kidneys are two organs that lie on either side of the spine between the middle of the back and the front of the abdomen. The kidneys:  Remove wastes and extra water from the blood.  Produce important hormones. These help keep bones strong, regulate blood pressure, and help create red blood cells.  Balance the fluids and chemicals in the blood and tissues. A small amount of kidney damage may not cause problems, but a large amount of damage may make it difficult or impossible for the  kidneys to work the way they should. If steps are not taken to slow down the kidney damage or stop it from getting worse, the kidneys may stop working permanently. Most of the time, chronic kidney disease does not  go away. However, it can often be controlled, and those with the disease can usually live normal lives. CAUSES The most common causes of chronic kidney disease are diabetes and high blood pressure (hypertension). Chronic kidney disease may also be caused by:  Diseases that cause the kidneys' filters to become inflamed.  Diseases that affect the immune system.  Genetic diseases.  Medicines that damage the kidneys, such as anti-inflammatory medicines.  Poisoning or exposure to toxic substances.  A reoccurring kidney or urinary infection.  A problem with urine flow. This may be caused by:  Cancer.  Kidney stones.  An enlarged prostate in males. SIGNS AND SYMPTOMS Because the kidney damage in chronic kidney disease occurs slowly, symptoms develop slowly and may not be obvious until the kidney damage becomes severe. A person may have a kidney disease for years without showing any symptoms. Symptoms can include:  Swelling (edema) of the legs, ankles, or feet.  Tiredness (lethargy).  Nausea or vomiting.  Confusion.  Problems with urination, such as:  Decreased urine production.  Frequent urination, especially at night.  Frequent accidents in children who are potty trained.  Muscle twitches and cramps.  Shortness of breath.  Weakness.  Persistent itchiness.  Loss of appetite.  Metallic taste in the mouth.  Trouble sleeping.  Slowed development in children.  Short stature in children. DIAGNOSIS Chronic kidney disease may be detected and diagnosed by tests, including blood, urine, imaging, or kidney biopsy tests. TREATMENT Most chronic kidney diseases cannot be cured. Treatment usually involves relieving symptoms and preventing or slowing the progression of the disease. Treatment may include:  A special diet. You may need to avoid alcohol and foods thatare salty and high in potassium.  Medicines. These may:  Lower blood pressure.  Relieve  anemia.  Relieve swelling.  Protect the bones. HOME CARE INSTRUCTIONS  Follow your prescribed diet. Your health care provider may instruct you to limit daily salt (sodium) and protein intake.  Take medicines only as directed by your health care provider. Do not take any new medicines (prescription, over-the-counter, or nutritional supplements) unless approved by your health care provider. Many medicines can worsen your kidney damage or need to have the dose adjusted.   Quit smoking if you smoke. Talk to your health care provider about a smoking cessation program.  Keep all follow-up visits as directed by your health care provider.  Monitor your blood pressure.  Start or continue an exercise plan.  Get immunizations as directed by your health care provider.  Take vitamin and mineral supplements as directed by your health care provider. SEEK IMMEDIATE MEDICAL CARE IF:  Your symptoms get worse or you develop new symptoms.  You develop symptoms of end-stage kidney disease. These include:  Headaches.  Abnormally dark or light skin.  Numbness in the hands or feet.  Easy bruising.  Frequent hiccups.  Menstruation stops.  You have a fever.  You have decreased urine production.  You havepain or bleeding when urinating. MAKE SURE YOU:  Understand these instructions.  Will watch your condition.  Will get help right away if you are not doing well or get worse. FOR MORE INFORMATION   American Association of Kidney Patients: ResidentialShow.is  National Kidney Foundation: www.kidney.org  American Kidney Fund: FightingMatch.com.ee  [employer] [facility]: www.lifeoptions.org and www.kidneyschool.org   This information is not intended to replace advice given to you by your health care provider. Make sure you discuss any questions you have with your health care provider.   Document Released: 06/04/2008 Document Revised: 09/16/2014 Document Reviewed:  04/24/2012 Elsevier Interactive Patient Education [employer]

## 2015-12-08 NOTE — ED Provider Notes (Signed)
[facility] Emergency Department Provider Note     Time seen: ----------------------------------------- 10:25 AM on 12/08/2015 -----------------------------------------    I have reviewed the triage vital signs and the nursing notes.   HISTORY  Chief Complaint Dizziness    HPI Kelly Leonard is a 60 y.o. male who presents to ER with vomiting for 3 days as well as diarrhea. Patient reports having a syncopal episode yesterday, EMS arrived to his house but he refused transport. Patient went to work today and felt lightheaded and dizzy and was sent to the ER for further evaluation. Patient still feels nauseous and dizzy at this time.   Past Medical History  Diagnosis Date  . MI (myocardial infarction) (HCC)   . CHF (congestive heart failure) (HCC)   . Renal disorder   . Hypertension   . Anxiety     Patient Active Problem List   Diagnosis Date Noted  . Chronic systolic heart failure (HCC) 06/23/2015  . Chronic systolic HF (heart failure) (HCC) 06/23/2015    Past Surgical History  Procedure Laterality Date  . Cardiac catheterization    . Implantable cardioverter defibrillator (icd) generator change Right 06/23/2015    Procedure: ICD GENERATOR  INITIAL IMPLANT;  Surgeon: Sharion Settler, MD;  Location: [facility] ORS;  Service: Cardiovascular;  Laterality: Right;    Allergies Penicillins  Social History Social History  Substance Use Topics  . Smoking status: Never Smoker   . Smokeless tobacco: None  . Alcohol Use: Yes    Review of Systems Constitutional: Negative for fever. Eyes: Negative for visual changes. ENT: Negative for sore throat. Cardiovascular: Negative for chest pain. Respiratory: Negative for shortness of breath. Gastrointestinal: Negative for abdominal pain, Positive for vomiting and diarrhea Genitourinary: Negative for dysuria. Musculoskeletal: Negative for back pain. Skin: Negative for rash. Neurological: Negative for  headaches, positive for weakness and dizziness  10-point ROS otherwise negative.  ____________________________________________   PHYSICAL EXAM:  VITAL SIGNS: ED Triage Vitals  Enc Vitals Group     BP 12/08/15 0838 144/95 mmHg     Pulse Rate 12/08/15 0838 97     Resp 12/08/15 0838 18     Temp 12/08/15 0838 97.9 F (36.6 C)     Temp Source 12/08/15 0838 Oral     SpO2 12/08/15 0838 100 %     Weight 12/08/15 0838 138 lb (62.596 kg)     Height 12/08/15 0838 5' (1.524 m)     Head Cir --      Peak Flow --      Pain Score --      Pain Loc --      Pain Edu? --      Excl. in GC? --     Constitutional: Alert and oriented. Well appearing and in no distress. Eyes: Conjunctivae are normal. PERRL. Normal extraocular movements. ENT   Head: Normocephalic and atraumatic.   Nose: No congestion/rhinnorhea.   Mouth/Throat: Mucous membranes are moist.   Neck: No stridor. Cardiovascular: Normal rate, regular rhythm. Normal and symmetric distal pulses are present in all extremities. No murmurs, rubs, or gallops. Respiratory: Normal respiratory effort without tachypnea nor retractions. Breath sounds are clear and equal bilaterally. No wheezes/rales/rhonchi. Gastrointestinal: Soft and nontender. No distention. No abdominal bruits.  Musculoskeletal: Nontender with normal range of motion in all extremities. No joint effusions.  No lower extremity tenderness nor edema. Neurologic:  Normal speech and language. No gross focal neurologic deficits are appreciated. Speech is normal. No gait instability. Skin:  Skin  is warm, dry and intact. No rash noted. Psychiatric: Mood and affect are normal. Speech and behavior are normal. Patient exhibits appropriate insight and judgment. ____________________________________________  EKG: Interpreted by me.Normal sinus rhythm with a rate of 96 bpm, normal PR interval, normal QRS, normal QT interval. Nonspecific ST and T wave changes,  LVH  ____________________________________________  ED COURSE:  Pertinent labs & imaging results that were available during my care of the patient were reviewed by me and considered in my medical decision making (see chart for details). Patient does not appear ill clinically, but has multiple risk factors. He will need gentle IV fluids, will check basic labs and reevaluate. ____________________________________________    LABS (pertinent positives/negatives)  Labs Reviewed  BASIC METABOLIC PANEL - Abnormal; Notable for the following:    BUN 56 (*)    Creatinine, Ser 3.37 (*)    GFR calc non Af Amer 19 (*)    GFR calc Af Amer 21 (*)    All other components within normal limits  TROPONIN I - Abnormal; Notable for the following:    Troponin I 0.05 (*)    All other components within normal limits  BASIC METABOLIC PANEL - Abnormal; Notable for the following:    BUN 53 (*)    Creatinine, Ser 2.89 (*)    Calcium 7.7 (*)    GFR calc non Af Amer 22 (*)    GFR calc Af Amer 26 (*)    All other components within normal limits  TROPONIN I - Abnormal; Notable for the following:    Troponin I 0.05 (*)    All other components within normal limits  CBC   ___________________________________________  FINAL ASSESSMENT AND PLAN  Gastroenteritis, dehydration, chronic kidney disease, elevated troponin secondary to chronic kidney disease  Plan: Patient with labs as dictated above. Patient looks well, we have rechecked his chemistries and troponin which appear Improved. He'll be discharged with antiemetics and encouraged to have close follow-up with his doctor for reevaluation. He denies any difficulty breathing or nausea this time.   Emily Filbert, MD   Emily Filbert, MD 12/08/15 1257

## 2015-12-08 NOTE — ED Notes (Signed)
Pt presents with vomiting for three days. Possible dehydration.

## 2016-11-18 ENCOUNTER — Emergency Department
Admission: EM | Admit: 2016-11-18 | Discharge: 2016-11-18 | Disposition: A | Discharge status: Home / Self Care | Payer: BLUE CROSS/BLUE SHIELD | Attending: Emergency Medicine | Admitting: Emergency Medicine

## 2016-11-18 ENCOUNTER — Encounter: Payer: Self-pay | Admitting: Emergency Medicine

## 2016-11-18 ENCOUNTER — Emergency Department: Payer: BLUE CROSS/BLUE SHIELD

## 2016-11-18 DIAGNOSIS — J069 Acute upper respiratory infection, unspecified: Secondary | ICD-10-CM

## 2016-11-18 DIAGNOSIS — I110 Hypertensive heart disease with heart failure: Secondary | ICD-10-CM | POA: Insufficient documentation

## 2016-11-18 DIAGNOSIS — Z7982 Long term (current) use of aspirin: Secondary | ICD-10-CM | POA: Diagnosis not present

## 2016-11-18 DIAGNOSIS — B9789 Other viral agents as the cause of diseases classified elsewhere: Secondary | ICD-10-CM

## 2016-11-18 DIAGNOSIS — I5022 Chronic systolic (congestive) heart failure: Secondary | ICD-10-CM | POA: Insufficient documentation

## 2016-11-18 DIAGNOSIS — Z79899 Other long term (current) drug therapy: Secondary | ICD-10-CM | POA: Insufficient documentation

## 2016-11-18 DIAGNOSIS — R05 Cough: Secondary | ICD-10-CM | POA: Diagnosis present

## 2016-11-18 LAB — COMPREHENSIVE METABOLIC PANEL
ALBUMIN: 2.6 g/dL — AB (ref 3.5–5.0)
ALT: 26 U/L (ref 17–63)
AST: 41 U/L (ref 15–41)
Alkaline Phosphatase: 132 U/L — ABNORMAL HIGH (ref 38–126)
Anion gap: 10 (ref 5–15)
BUN: 47 mg/dL — ABNORMAL HIGH (ref 6–20)
CHLORIDE: 99 mmol/L — AB (ref 101–111)
CO2: 25 mmol/L (ref 22–32)
Calcium: 8.6 mg/dL — ABNORMAL LOW (ref 8.9–10.3)
Creatinine, Ser: 2.61 mg/dL — ABNORMAL HIGH (ref 0.61–1.24)
GFR calc non Af Amer: 25 mL/min — ABNORMAL LOW (ref >60)
GFR, EST AFRICAN AMERICAN: 29 mL/min — AB (ref >60)
GLUCOSE: 110 mg/dL — AB (ref 65–99)
POTASSIUM: 4.4 mmol/L (ref 3.5–5.1)
SODIUM: 134 mmol/L — AB (ref 135–145)
Total Bilirubin: 1.1 mg/dL (ref 0.3–1.2)
Total Protein: 7.8 g/dL (ref 6.5–8.1)

## 2016-11-18 LAB — CBC
HCT: 37.2 % — ABNORMAL LOW (ref 40.0–52.0)
HEMOGLOBIN: 12.6 g/dL — AB (ref 13.0–18.0)
MCH: 28.8 pg (ref 26.0–34.0)
MCHC: 34 g/dL (ref 32.0–36.0)
MCV: 84.7 fL (ref 80.0–100.0)
Platelets: 492 10*3/uL — ABNORMAL HIGH (ref 150–440)
RBC: 4.39 MIL/uL — AB (ref 4.40–5.90)
RDW: 13.9 % (ref 11.5–14.5)
WBC: 15.5 10*3/uL — ABNORMAL HIGH (ref 3.8–10.6)

## 2016-11-18 MED ORDER — ALBUTEROL SULFATE HFA 108 (90 BASE) MCG/ACT IN AERS: 2.0000 | INHALATION_SPRAY | RESPIRATORY_TRACT | 0 refills | Status: DC | PRN

## 2016-11-18 MED ORDER — SODIUM CHLORIDE 0.9 % IV BOLUS (SEPSIS)
500.0000 mL | Freq: Once | INTRAVENOUS | Status: AC
  Administered 2016-11-18: 500 mL via INTRAVENOUS

## 2016-11-18 MED ORDER — IPRATROPIUM-ALBUTEROL 0.5-2.5 (3) MG/3ML IN SOLN
RESPIRATORY_TRACT | Status: AC
  Administered 2016-11-18: 3 mL via RESPIRATORY_TRACT
  Filled 2016-11-18: qty 3

## 2016-11-18 MED ORDER — IPRATROPIUM-ALBUTEROL 0.5-2.5 (3) MG/3ML IN SOLN
3.0000 mL | Freq: Once | RESPIRATORY_TRACT | Status: AC
  Administered 2016-11-18: 3 mL via RESPIRATORY_TRACT

## 2016-11-18 NOTE — ED Triage Notes (Signed)
Diagnosed with influenza 5 days ago, remains weak and nauseated.

## 2016-11-18 NOTE — ED Provider Notes (Signed)
[facility] Emergency Department Provider Note   ____________________________________________   I have reviewed the triage vital signs and the nursing notes.   HISTORY  Chief Complaint Cough   History limited by: Not Limited   HPI Kelly Leonard is a 61 y.o. male who presents to the emergency department today because of concerns for continued weakness and fatigue. Patient was diagnosed with influenza 6 days ago. He states that today he tried going back to work and felt very weak. He had a near syncopal episode. He has had associated shortness of breath. He has been trying to make sure that he is well-hydrated at home. He did not take Tamiflu.    Past Medical History:  Diagnosis Date  . Anxiety   . CHF (congestive heart failure) (HCC)   . Hypertension   . MI (myocardial infarction)   . Renal disorder     Patient Active Problem List   Diagnosis Date Noted  . Chronic systolic heart failure (HCC) 06/23/2015  . Chronic systolic HF (heart failure) (HCC) 06/23/2015    Past Surgical History:  Procedure Laterality Date  . CARDIAC CATHETERIZATION    . IMPLANTABLE CARDIOVERTER DEFIBRILLATOR (ICD) GENERATOR CHANGE Right 06/23/2015   Procedure: ICD GENERATOR  INITIAL IMPLANT;  Surgeon: Sharion Settler, MD;  Location: [facility] ORS;  Service: Cardiovascular;  Laterality: Right;    Prior to Admission medications   Medication Sig Start Date End Date Taking? Authorizing Provider  ALPRAZolam Prudy Feeler) 0.25 MG tablet Take 0.25 mg by mouth daily as needed for anxiety.    Historical Provider, MD  Ascorbic Acid (VITAMIN C PO) Take 1 tablet by mouth daily.    Historical Provider, MD  aspirin EC 81 MG tablet Take 81 mg by mouth daily.    Historical Provider, MD  B Complex-C (B-COMPLEX WITH VITAMIN C) tablet Take 1 tablet by mouth daily.    Historical Provider, MD  carvedilol (COREG) 6.25 MG tablet Take 6.25 mg by mouth 2 (two) times daily with a meal.    Historical Provider,  MD  Cyanocobalamin (VITAMIN B-12 PO) Take 1 tablet by mouth daily.    Historical Provider, MD  furosemide (LASIX) 40 MG tablet Take 40 mg by mouth 2 (two) times daily.     Historical Provider, MD  loperamide (IMODIUM A-D) 2 MG tablet Take 1 tablet (2 mg total) by mouth 4 (four) times daily as needed for diarrhea or loose stools. 12/08/15   Emily Filbert, MD  MAGNESIUM GLUCONATE PO Take 1 tablet by mouth daily.    Historical Provider, MD  Multiple Vitamin (MULTIVITAMIN) tablet Take 1 tablet by mouth daily.    Historical Provider, MD  ondansetron (ZOFRAN) 4 MG tablet Take 1 tablet (4 mg total) by mouth daily as needed for nausea or vomiting. 12/08/15   Emily Filbert, MD  sertraline (ZOLOFT) 25 MG tablet Take 25 mg by mouth daily.    Historical Provider, MD    Allergies Penicillins  No family history on file.  Social History Social History  Substance Use Topics  . Smoking status: Never Smoker  . Smokeless tobacco: Not on file  . Alcohol use Yes    Review of Systems  Constitutional: Negative for fever. Cardiovascular: Negative for chest pain. Respiratory: Positive for shortness of breath. Gastrointestinal: Negative for abdominal pain, vomiting and diarrhea. Genitourinary: Negative for dysuria. Musculoskeletal: Negative for back pain. Skin: Negative for rash. Neurological: Negative for headaches, focal weakness or numbness.  10-point ROS otherwise negative.  ____________________________________________  PHYSICAL EXAM:  VITAL SIGNS: ED Triage Vitals  Enc Vitals Group     BP 11/18/16 1307 114/76     Pulse Rate 11/18/16 1307 92     Resp 11/18/16 1307 16     Temp 11/18/16 1307 97.6 F (36.4 C)     Temp Source 11/18/16 1307 Oral     SpO2 11/18/16 1307 100 %     Weight 11/18/16 1310 146 lb (66.2 kg)     Height 11/18/16 1310 5\' 10"  (1.778 m)     Head Circumference --      Peak Flow --      Pain Score 11/18/16 1316 10   Constitutional: Alert and oriented. Well  appearing and in no distress. Eyes: Conjunctivae are normal. Normal extraocular movements. ENT   Head: Normocephalic and atraumatic.   Nose: No congestion/rhinnorhea.   Mouth/Throat: Mucous membranes are moist.   Neck: No stridor. Hematological/Lymphatic/Immunilogical: No cervical lymphadenopathy. Cardiovascular: Normal rate, regular rhythm.  No murmurs, rubs, or gallops. Respiratory: Normal respiratory effort without tachypnea nor retractions. Breath sounds are clear and equal bilaterally. No wheezes/rales/rhonchi. Gastrointestinal: Soft and non tender. No rebound. No guarding.  Genitourinary: Deferred Musculoskeletal: Normal range of motion in all extremities. No lower extremity edema. Neurologic:  Normal speech and language. No gross focal neurologic deficits are appreciated.  Skin:  Skin is warm, dry and intact. No rash noted. Psychiatric: Mood and affect are normal. Speech and behavior are normal. Patient exhibits appropriate insight and judgment.  ____________________________________________    LABS (pertinent positives/negatives)  Labs Reviewed  COMPREHENSIVE METABOLIC PANEL - Abnormal; Notable for the following:       Result Value   Sodium 134 (*)    Chloride 99 (*)    Glucose, Bld 110 (*)    BUN 47 (*)    Creatinine, Ser 2.61 (*)    Calcium 8.6 (*)    Albumin 2.6 (*)    Alkaline Phosphatase 132 (*)    GFR calc non Af Amer 25 (*)    GFR calc Af Amer 29 (*)    All other components within normal limits  CBC - Abnormal; Notable for the following:    WBC 15.5 (*)    RBC 4.39 (*)    Hemoglobin 12.6 (*)    HCT 37.2 (*)    Platelets 492 (*)    All other components within normal limits     ____________________________________________   EKG  None  ____________________________________________    RADIOLOGY  CXr IMPRESSION:  No active disease. Pacemaker/ AICD.      ____________________________________________   PROCEDURES  Procedures  ____________________________________________   INITIAL IMPRESSION / ASSESSMENT AND PLAN / ED COURSE  Pertinent labs & imaging results that were available during my care of the patient were reviewed by me and considered in my medical decision making (see chart for details).  Patient presented to the emergency department today with continued weakness and fatigue and shortness breath after diagnosis of flu 6 days ago. Patient was given a DuoNeb which did help his symptoms of shortness of breath. Patient since her without any concerning findings of pneumonia. Patient with mild leukocytosis on blood work which likely secondary to flu. Will plan on giving the patient albuterol inhaler. Discussed expected course of influenza.  ____________________________________________   FINAL CLINICAL IMPRESSION(S) / ED DIAGNOSES  Final diagnoses:  Viral URI with cough     Note: This dictation was prepared with Dragon dictation. Any transcriptional errors that result from this process are unintentional  \  Phineas Semen, MD 11/18/16 [phone]

## 2016-11-18 NOTE — Discharge Instructions (Signed)
Please seek medical attention for any high fevers, chest pain, shortness of breath, change in behavior, persistent vomiting, bloody stool or any other new or concerning symptoms.  

## 2017-01-21 DIAGNOSIS — E785 Hyperlipidemia, unspecified: Secondary | ICD-10-CM | POA: Insufficient documentation

## 2017-01-21 DIAGNOSIS — N184 Chronic kidney disease, stage 4 (severe): Secondary | ICD-10-CM | POA: Insufficient documentation

## 2017-01-21 DIAGNOSIS — I5022 Chronic systolic (congestive) heart failure: Secondary | ICD-10-CM | POA: Insufficient documentation

## 2017-01-21 DIAGNOSIS — I420 Dilated cardiomyopathy: Secondary | ICD-10-CM | POA: Insufficient documentation

## 2017-01-21 DIAGNOSIS — Q613 Polycystic kidney, unspecified: Secondary | ICD-10-CM | POA: Insufficient documentation

## 2017-01-21 DIAGNOSIS — I10 Essential (primary) hypertension: Secondary | ICD-10-CM | POA: Insufficient documentation

## 2017-02-12 ENCOUNTER — Encounter: Payer: Self-pay | Admitting: Unknown Physician Specialty

## 2017-02-12 ENCOUNTER — Ambulatory Visit (INDEPENDENT_AMBULATORY_CARE_PROVIDER_SITE_OTHER): Payer: BLUE CROSS/BLUE SHIELD | Admitting: Unknown Physician Specialty

## 2017-02-12 VITALS — BP 138/78 | HR 67 | Temp 98.4°F | Ht 68.5 in | Wt 136.0 lb

## 2017-02-12 DIAGNOSIS — Z7689 Persons encountering health services in other specified circumstances: Secondary | ICD-10-CM

## 2017-02-12 DIAGNOSIS — D72829 Elevated white blood cell count, unspecified: Secondary | ICD-10-CM | POA: Diagnosis not present

## 2017-02-12 DIAGNOSIS — I5022 Chronic systolic (congestive) heart failure: Secondary | ICD-10-CM

## 2017-02-12 DIAGNOSIS — Z0000 Encounter for general adult medical examination without abnormal findings: Secondary | ICD-10-CM

## 2017-02-12 DIAGNOSIS — N184 Chronic kidney disease, stage 4 (severe): Secondary | ICD-10-CM

## 2017-02-12 DIAGNOSIS — I10 Essential (primary) hypertension: Secondary | ICD-10-CM

## 2017-02-12 MED ORDER — FUROSEMIDE 40 MG PO TABS: 80.0000 mg | ORAL_TABLET | Freq: Every day | ORAL | 3 refills | Status: DC

## 2017-02-12 NOTE — Assessment & Plan Note (Signed)
Refer to [facility] cardiology

## 2017-02-12 NOTE — Progress Notes (Signed)
BP 138/78   Pulse 67   Temp 98.4 F (36.9 C)   Ht 5' 8.5" (1.74 m)   Wt 136 lb (61.7 kg)   SpO2 100%   BMI 20.38 kg/m    Subjective:    Patient ID: Kelly Leonard, male    DOB: [birth year], 61 y.o.   MRN: [id number]  HPI: Kelly Leonard is a 61 y.o. male  Chief Complaint  Patient presents with  . Establish Care   Cardiomypathy Sees Dr. Cassie Freer with last visit around February.  He would like to have a different cardiologist and has had some insurance changes.    CKD Needs a new nephrologist due to insurance changes.    Depression/anxiety Happens on occassion Depression screen PHQ 2/9 02/12/2017  Decreased Interest 0  Down, Depressed, Hopeless 0  PHQ - 2 Score 0   Family History  Problem Relation Age of Onset  . Diabetes Mother   . Hypertension Brother   . Heart disease Maternal Grandmother    Social History   Social History  . Marital status: Single    Spouse name: N/A  . Number of children: N/A  . Years of education: N/A   Occupational History  . Not on file.   Social History Main Topics  . Smoking status: Never Smoker  . Smokeless tobacco: Never Used  . Alcohol use No  . Drug use: No  . Sexual activity: Yes   Other Topics Concern  . Not on file   Social History Narrative  . No narrative on file   Past Medical History:  Diagnosis Date  . Anxiety   . CHF (congestive heart failure) (HCC)   . Chronic systolic congestive heart failure (HCC)   . CKD (chronic kidney disease) stage 4, GFR 15-29 ml/min (HCC)   . COPD (chronic obstructive pulmonary disease) (HCC)   . Dilated cardiomyopathy (HCC)   . Hyperlipidemia   . Hypertension   . MI (myocardial infarction) (HCC)   . Polycystic kidney   . Renal disorder    Past Surgical History:  Procedure Laterality Date  . CARDIAC CATHETERIZATION    . IMPLANTABLE CARDIOVERTER DEFIBRILLATOR (ICD) GENERATOR CHANGE Right 06/23/2015   Procedure: ICD GENERATOR  INITIAL IMPLANT;  Surgeon: Sharion Settler,  MD;  Location: [facility] ORS;  Service: Cardiovascular;  Laterality: Right;     Relevant past medical, surgical, family and social history reviewed and updated as indicated. Interim medical history since our last visit reviewed. Allergies and medications reviewed and updated.  Review of Systems  Constitutional: Negative.   HENT: Negative.   Eyes: Negative.   Respiratory: Negative.   Cardiovascular: Negative.   Gastrointestinal: Negative.   Endocrine: Negative.   Genitourinary: Negative.   Skin: Negative.   Allergic/Immunologic: Negative.   Neurological: Negative.   Hematological: Negative.   Psychiatric/Behavioral: Negative.     Per HPI unless specifically indicated above     Objective:    BP 138/78   Pulse 67   Temp 98.4 F (36.9 C)   Ht 5' 8.5" (1.74 m)   Wt 136 lb (61.7 kg)   SpO2 100%   BMI 20.38 kg/m   Wt Readings from Last 3 Encounters:  02/12/17 136 lb (61.7 kg)  11/18/16 146 lb (66.2 kg)  12/08/15 138 lb (62.6 kg)    Physical Exam  Constitutional: He is oriented to person, place, and time. He appears well-developed and well-nourished. No distress.  HENT:  Head: Normocephalic and atraumatic.  Eyes: Conjunctivae and lids are normal.  Right eye exhibits no discharge. Left eye exhibits no discharge. No scleral icterus.  Neck: Normal range of motion. Neck supple. No JVD present. Carotid bruit is not present.  Cardiovascular: Normal rate, regular rhythm and normal heart sounds.   Pulmonary/Chest: Effort normal and breath sounds normal. No respiratory distress.  Abdominal: Normal appearance. There is no splenomegaly or hepatomegaly.  Musculoskeletal: Normal range of motion.  Neurological: He is alert and oriented to person, place, and time.  Skin: Skin is warm, dry and intact. No rash noted. No pallor.  Psychiatric: He has a normal mood and affect. His behavior is normal. Judgment and thought content normal.    Results for orders placed or performed during the  hospital encounter of 11/18/16  Comprehensive metabolic panel  Result Value Ref Range   Sodium 134 (L) 135 - 145 mmol/L   Potassium 4.4 3.5 - 5.1 mmol/L   Chloride 99 (L) 101 - 111 mmol/L   CO2 25 22 - 32 mmol/L   Glucose, Bld 110 (H) 65 - 99 mg/dL   BUN 47 (H) 6 - 20 mg/dL   Creatinine, Ser 5.78 (H) 0.61 - 1.24 mg/dL   Calcium 8.6 (L) 8.9 - 10.3 mg/dL   Total Protein 7.8 6.5 - 8.1 g/dL   Albumin 2.6 (L) 3.5 - 5.0 g/dL   AST 41 15 - 41 U/L   ALT 26 17 - 63 U/L   Alkaline Phosphatase 132 (H) 38 - 126 U/L   Total Bilirubin 1.1 0.3 - 1.2 mg/dL   GFR calc non Af Amer 25 (L) >60 mL/min   GFR calc Af Amer 29 (L) >60 mL/min   Anion gap 10 5 - 15  CBC  Result Value Ref Range   WBC 15.5 (H) 3.8 - 10.6 K/uL   RBC 4.39 (L) 4.40 - 5.90 MIL/uL   Hemoglobin 12.6 (L) 13.0 - 18.0 g/dL   HCT 46.9 (L) 62.9 - 52.8 %   MCV 84.7 80.0 - 100.0 fL   MCH 28.8 26.0 - 34.0 pg   MCHC 34.0 32.0 - 36.0 g/dL   RDW 41.3 24.4 - 01.0 %   Platelets 492 (H) 150 - 440 K/uL      Assessment & Plan:   Problem List Items Addressed This Visit      Unprioritized   Chronic systolic heart failure (HCC)    Refer to [facility] cardiology      Relevant Medications   valsartan (DIOVAN) 160 MG tablet   furosemide (LASIX) 40 MG tablet   Other Relevant Orders   Ambulatory referral to Cardiology   Comprehensive metabolic panel   TSH   CKD (chronic kidney disease) stage 4, GFR 15-29 ml/min (HCC)    Refer to nephrology      Relevant Orders   Ambulatory referral to Nephrology   Comprehensive metabolic panel   Encounter to establish care   Hypertension   Relevant Medications   valsartan (DIOVAN) 160 MG tablet   furosemide (LASIX) 40 MG tablet   Other Relevant Orders   Lipid Panel w/o Chol/HDL Ratio    Other Visit Diagnoses    Leukocytosis, unspecified type    -  Primary   Relevant Orders   CBC with Differential/Platelet   Routine general medical examination at a health care facility       Relevant Orders    Hepatitis C antibody   HIV antibody   Cologuard       Follow up plan: Return for physical. in about 3 months

## 2017-02-12 NOTE — Assessment & Plan Note (Signed)
Refer to nephrology 

## 2017-02-13 ENCOUNTER — Encounter: Payer: Self-pay | Admitting: Unknown Physician Specialty

## 2017-02-13 LAB — CBC WITH DIFFERENTIAL/PLATELET
Basophils Absolute: 0 10*3/uL (ref 0.0–0.2)
Basos: 1 %
EOS (ABSOLUTE): 0.2 10*3/uL (ref 0.0–0.4)
EOS: 3 %
HEMATOCRIT: 42.9 % (ref 37.5–51.0)
Hemoglobin: 13.2 g/dL (ref 13.0–17.7)
Immature Grans (Abs): 0 10*3/uL (ref 0.0–0.1)
Immature Granulocytes: 0 %
LYMPHS ABS: 2.3 10*3/uL (ref 0.7–3.1)
Lymphs: 34 %
MCH: 27.4 pg (ref 26.6–33.0)
MCHC: 30.8 g/dL — AB (ref 31.5–35.7)
MCV: 89 fL (ref 79–97)
MONOS ABS: 0.5 10*3/uL (ref 0.1–0.9)
Monocytes: 7 %
Neutrophils Absolute: 3.9 10*3/uL (ref 1.4–7.0)
Neutrophils: 55 %
Platelets: 284 10*3/uL (ref 150–379)
RBC: 4.81 x10E6/uL (ref 4.14–5.80)
RDW: 16.3 % — AB (ref 12.3–15.4)
WBC: 6.9 10*3/uL (ref 3.4–10.8)

## 2017-02-13 LAB — COMPREHENSIVE METABOLIC PANEL
ALK PHOS: 66 IU/L (ref 39–117)
ALT: 18 IU/L (ref 0–44)
AST: 26 IU/L (ref 0–40)
Albumin/Globulin Ratio: 1.3 (ref 1.2–2.2)
Albumin: 4.6 g/dL (ref 3.6–4.8)
BUN/Creatinine Ratio: 13 (ref 10–24)
BUN: 35 mg/dL — ABNORMAL HIGH (ref 8–27)
Bilirubin Total: 0.6 mg/dL (ref 0.0–1.2)
CO2: 20 mmol/L (ref 18–29)
Calcium: 10.1 mg/dL (ref 8.6–10.2)
Chloride: 100 mmol/L (ref 96–106)
Creatinine, Ser: 2.65 mg/dL — ABNORMAL HIGH (ref 0.76–1.27)
GFR calc Af Amer: 29 mL/min/{1.73_m2} — ABNORMAL LOW (ref >59)
GFR calc non Af Amer: 25 mL/min/{1.73_m2} — ABNORMAL LOW (ref >59)
GLOBULIN, TOTAL: 3.5 g/dL (ref 1.5–4.5)
Glucose: 85 mg/dL (ref 65–99)
POTASSIUM: 5.1 mmol/L (ref 3.5–5.2)
SODIUM: 142 mmol/L (ref 134–144)
Total Protein: 8.1 g/dL (ref 6.0–8.5)

## 2017-02-13 LAB — LIPID PANEL W/O CHOL/HDL RATIO
CHOLESTEROL TOTAL: 201 mg/dL — AB (ref 100–199)
HDL: 63 mg/dL (ref >39)
LDL Calculated: 120 mg/dL — ABNORMAL HIGH (ref 0–99)
Triglycerides: 88 mg/dL (ref 0–149)
VLDL CHOLESTEROL CAL: 18 mg/dL (ref 5–40)

## 2017-02-13 LAB — HEPATITIS C ANTIBODY: Hep C Virus Ab: >11 s/co ratio — ABNORMAL HIGH (ref 0.0–0.9)

## 2017-02-13 LAB — TSH: TSH: 2.79 u[IU]/mL (ref 0.450–4.500)

## 2017-02-13 LAB — HIV ANTIBODY (ROUTINE TESTING W REFLEX): HIV Screen 4th Generation wRfx: NONREACTIVE

## 2017-02-14 ENCOUNTER — Telehealth: Payer: Self-pay | Admitting: Unknown Physician Specialty

## 2017-02-14 NOTE — Telephone Encounter (Signed)
Called to discuss Hep C.  Unable to reach pt

## 2017-02-17 ENCOUNTER — Telehealth: Payer: Self-pay | Admitting: Unknown Physician Specialty

## 2017-02-17 DIAGNOSIS — R768 Other specified abnormal immunological findings in serum: Secondary | ICD-10-CM

## 2017-02-17 NOTE — Telephone Encounter (Signed)
Talked to wife about positive Hep C antibody.  Refer to GI

## 2017-02-28 LAB — COLOGUARD: Cologuard: NEGATIVE

## 2017-03-07 ENCOUNTER — Encounter: Payer: Self-pay | Admitting: Family Medicine

## 2017-03-15 ENCOUNTER — Emergency Department
Admission: EM | Admit: 2017-03-15 | Discharge: 2017-03-15 | Disposition: A | Discharge status: Home / Self Care | Payer: BLUE CROSS/BLUE SHIELD | Attending: Emergency Medicine | Admitting: Emergency Medicine

## 2017-03-15 ENCOUNTER — Encounter: Payer: Self-pay | Admitting: Emergency Medicine

## 2017-03-15 ENCOUNTER — Emergency Department: Payer: BLUE CROSS/BLUE SHIELD

## 2017-03-15 DIAGNOSIS — N184 Chronic kidney disease, stage 4 (severe): Secondary | ICD-10-CM | POA: Diagnosis not present

## 2017-03-15 DIAGNOSIS — J449 Chronic obstructive pulmonary disease, unspecified: Secondary | ICD-10-CM | POA: Insufficient documentation

## 2017-03-15 DIAGNOSIS — Z7982 Long term (current) use of aspirin: Secondary | ICD-10-CM | POA: Diagnosis not present

## 2017-03-15 DIAGNOSIS — I5022 Chronic systolic (congestive) heart failure: Secondary | ICD-10-CM | POA: Diagnosis not present

## 2017-03-15 DIAGNOSIS — Z79899 Other long term (current) drug therapy: Secondary | ICD-10-CM | POA: Diagnosis not present

## 2017-03-15 DIAGNOSIS — N189 Chronic kidney disease, unspecified: Secondary | ICD-10-CM

## 2017-03-15 DIAGNOSIS — I10 Essential (primary) hypertension: Secondary | ICD-10-CM

## 2017-03-15 DIAGNOSIS — I130 Hypertensive heart and chronic kidney disease with heart failure and stage 1 through stage 4 chronic kidney disease, or unspecified chronic kidney disease: Secondary | ICD-10-CM | POA: Diagnosis not present

## 2017-03-15 DIAGNOSIS — R0602 Shortness of breath: Secondary | ICD-10-CM | POA: Diagnosis present

## 2017-03-15 DIAGNOSIS — N179 Acute kidney failure, unspecified: Secondary | ICD-10-CM

## 2017-03-15 DIAGNOSIS — I509 Heart failure, unspecified: Secondary | ICD-10-CM

## 2017-03-15 LAB — COMPREHENSIVE METABOLIC PANEL
ALT: 27 U/L (ref 17–63)
ANION GAP: 9 (ref 5–15)
AST: 45 U/L — ABNORMAL HIGH (ref 15–41)
Albumin: 4 g/dL (ref 3.5–5.0)
Alkaline Phosphatase: 60 U/L (ref 38–126)
BUN: 51 mg/dL — ABNORMAL HIGH (ref 6–20)
CO2: 24 mmol/L (ref 22–32)
Calcium: 9.4 mg/dL (ref 8.9–10.3)
Chloride: 103 mmol/L (ref 101–111)
Creatinine, Ser: 2.92 mg/dL — ABNORMAL HIGH (ref 0.61–1.24)
GFR, EST AFRICAN AMERICAN: 25 mL/min — AB (ref >60)
GFR, EST NON AFRICAN AMERICAN: 22 mL/min — AB (ref >60)
Glucose, Bld: 85 mg/dL (ref 65–99)
Potassium: 4.8 mmol/L (ref 3.5–5.1)
SODIUM: 136 mmol/L (ref 135–145)
Total Bilirubin: 1.1 mg/dL (ref 0.3–1.2)
Total Protein: 8.4 g/dL — ABNORMAL HIGH (ref 6.5–8.1)

## 2017-03-15 LAB — CBC WITH DIFFERENTIAL/PLATELET
Basophils Absolute: 0.1 10*3/uL (ref 0–0.1)
Basophils Relative: 1 %
EOS PCT: 3 %
Eosinophils Absolute: 0.2 10*3/uL (ref 0–0.7)
HCT: 41 % (ref 40.0–52.0)
Hemoglobin: 13.7 g/dL (ref 13.0–18.0)
LYMPHS ABS: 2 10*3/uL (ref 1.0–3.6)
Lymphocytes Relative: 25 %
MCH: 28.1 pg (ref 26.0–34.0)
MCHC: 33.3 g/dL (ref 32.0–36.0)
MCV: 84.3 fL (ref 80.0–100.0)
MONO ABS: 0.5 10*3/uL (ref 0.2–1.0)
MONOS PCT: 6 %
Neutro Abs: 5.1 10*3/uL (ref 1.4–6.5)
Neutrophils Relative %: 65 %
PLATELETS: 238 10*3/uL (ref 150–440)
RBC: 4.86 MIL/uL (ref 4.40–5.90)
RDW: 15.1 % — AB (ref 11.5–14.5)
WBC: 7.9 10*3/uL (ref 3.8–10.6)

## 2017-03-15 LAB — TROPONIN I: TROPONIN I: 0.03 ng/mL — AB (ref <0.03)

## 2017-03-15 LAB — BRAIN NATRIURETIC PEPTIDE: B NATRIURETIC PEPTIDE 5: 1305 pg/mL — AB (ref 0.0–100.0)

## 2017-03-15 MED ORDER — ASPIRIN 81 MG PO CHEW
324.0000 mg | CHEWABLE_TABLET | Freq: Once | ORAL | Status: AC
  Administered 2017-03-15: 324 mg via ORAL
  Filled 2017-03-15: qty 4

## 2017-03-15 MED ORDER — NITROGLYCERIN 2 % TD OINT: 1.0000 [in_us] | TOPICAL_OINTMENT | Freq: Once | TRANSDERMAL | Status: DC

## 2017-03-15 MED ORDER — IPRATROPIUM-ALBUTEROL 0.5-2.5 (3) MG/3ML IN SOLN
3.0000 mL | Freq: Once | RESPIRATORY_TRACT | Status: AC
  Administered 2017-03-15: 3 mL via RESPIRATORY_TRACT
  Filled 2017-03-15: qty 3

## 2017-03-15 MED ORDER — BUMETANIDE 0.25 MG/ML IJ SOLN
1.0000 mg | Freq: Once | INTRAMUSCULAR | Status: AC
  Administered 2017-03-15: 1 mg via INTRAVENOUS
  Filled 2017-03-15: qty 4

## 2017-03-15 MED ORDER — FUROSEMIDE 10 MG/ML IJ SOLN: 40.0000 mg | Freq: Once | INTRAMUSCULAR | Status: DC

## 2017-03-15 NOTE — ED Provider Notes (Signed)
[facility] Emergency Department Provider Note   ____________________________________________   First MD Initiated Contact with Patient 03/15/17 0210     (approximate)  I have reviewed the triage vital signs and the nursing notes.   HISTORY  Chief Complaint Shortness of Breath    HPI Kelly Leonard is a 61 y.o. male who presents to the ED from home with a chief complaint of shortness of breath. Patient has a history of congestive heart failure, status post AICD, chronic kidney disease who has been having intermittent cough with shortness of breath for the past 2-3 days. Has been compliant with his Lasix 40 mg twice daily.Shortness of breath associated with chest tightness. Denies recent fever, chills, abdominal pain, nausea, vomiting. Denies recent travel or trauma. Nothing makes his symptoms better; exertion makes his symptoms worse.   Past Medical History:  Diagnosis Date  . Anxiety   . CHF (congestive heart failure) (HCC)   . Chronic systolic congestive heart failure (HCC)   . CKD (chronic kidney disease) stage 4, GFR 15-29 ml/min (HCC)   . COPD (chronic obstructive pulmonary disease) (HCC)   . Dilated cardiomyopathy (HCC)   . Hyperlipidemia   . Hypertension   . MI (myocardial infarction) (HCC)   . Polycystic kidney   . Renal disorder     Patient Active Problem List   Diagnosis Date Noted  . 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 Encounter to establish care 02/12/2017  . Hypertension   . Hyperlipidemia   . Anxiety   . Chronic systolic congestive heart failure (HCC)   . Dilated cardiomyopathy (HCC)   . Polycystic kidney   . CKD (chronic kidney disease) stage 4, GFR 15-29 ml/min (HCC)   . Chronic systolic heart failure (HCC) 06/23/2015  . Chronic systolic HF (heart failure) (HCC) 06/23/2015    Past Surgical History:  Procedure Laterality Date  . CARDIAC CATHETERIZATION    . IMPLANTABLE CARDIOVERTER DEFIBRILLATOR (ICD)  GENERATOR CHANGE Right 06/23/2015   Procedure: ICD GENERATOR  INITIAL IMPLANT;  Surgeon: Sharion Settler, MD;  Location: [facility] ORS;  Service: Cardiovascular;  Laterality: Right;    Prior to Admission medications   Medication Sig Start Date End Date Taking? Authorizing Provider  albuterol (PROVENTIL HFA;VENTOLIN HFA) 108 (90 Base) MCG/ACT inhaler Inhale 2 puffs into the lungs every 4 (four) hours as needed for wheezing or shortness of breath. Patient not taking: Reported on 02/12/2017 11/18/16   Phineas Semen, MD  aspirin EC 81 MG tablet Take 81 mg by mouth daily.    [provider]  carvedilol (COREG) 6.25 MG tablet Take 6.25 mg by mouth 2 (two) times daily with a meal.    [provider]  furosemide (LASIX) 40 MG tablet Take 2 tablets (80 mg total) by mouth daily. 02/12/17   Gabriel Cirri, NP  sertraline (ZOLOFT) 25 MG tablet Take 25 mg by mouth daily.    [provider]  valsartan (DIOVAN) 160 MG tablet Take 160 mg by mouth daily. 05/27/15   [provider]    Allergies Penicillins and Ace inhibitors  Family History  Problem Relation Age of Onset  . Diabetes Mother   . Hypertension Brother   . Heart disease Maternal Grandmother     Social History Social History  Substance Use Topics  . Smoking status: Never Smoker  . Smokeless tobacco: Never Used  . Alcohol use No    Review of Systems  Constitutional: No fever/chills. Eyes: No visual changes. ENT: No  sore throat. Cardiovascular: Positive for chest pain. Respiratory: Positive for shortness of breath. Gastrointestinal: No abdominal pain.  No nausea, no vomiting.  No diarrhea.  No constipation. Genitourinary: Negative for dysuria. Musculoskeletal: Negative for back pain. Skin: Negative for rash. Neurological: Negative for headaches, focal weakness or numbness.   ____________________________________________   PHYSICAL EXAM:  VITAL SIGNS: ED Triage Vitals  Enc Vitals Group     BP  03/15/17 0206 (!) 138/97     Pulse Rate 03/15/17 0206 86     Resp 03/15/17 0206 (!) 28     Temp 03/15/17 0206 (!) 97.5 F (36.4 C)     Temp Source 03/15/17 0206 Oral     SpO2 03/15/17 0206 100 %     Weight 03/15/17 0203 136 lb (61.7 kg)     Height 03/15/17 0203 5\' 9"  (1.753 m)     Head Circumference --      Peak Flow --      Pain Score --      Pain Loc --      Pain Edu? --      Excl. in GC? --     Constitutional: Alert and oriented. Uncomfortable appearing and in mild acute distress. Eyes: Conjunctivae are normal. PERRL. EOMI. Head: Atraumatic. Nose: No congestion/rhinnorhea. Mouth/Throat: Mucous membranes are moist.  Oropharynx non-erythematous. Neck: No stridor.   Cardiovascular: Normal rate, regular rhythm. Grossly normal heart sounds.  Good peripheral circulation. Respiratory: Increased respiratory effort.  No retractions. Tripoding. Lungs with bibasilar rales. Gastrointestinal: Soft and nontender. No distention. No abdominal bruits. No CVA tenderness. Musculoskeletal: No lower extremity tenderness nor edema.  No joint effusions. Neurologic:  Normal speech and language. No gross focal neurologic deficits are appreciated.  Skin:  Skin is warm, dry and intact. No rash noted. Psychiatric: Mood and affect are normal. Speech and behavior are normal.  ____________________________________________   LABS (all labs ordered are listed, but only abnormal results are displayed)  Labs Reviewed  CBC WITH DIFFERENTIAL/PLATELET - Abnormal; Notable for the following:       Result Value   RDW 15.1 (*)    All other components within normal limits  COMPREHENSIVE METABOLIC PANEL - Abnormal; Notable for the following:    BUN 51 (*)    Creatinine, Ser 2.92 (*)    Total Protein 8.4 (*)    AST 45 (*)    GFR calc non Af Amer 22 (*)    GFR calc Af Amer 25 (*)    All other components within normal limits  BRAIN NATRIURETIC PEPTIDE - Abnormal; Notable for the following:    B Natriuretic  Peptide 1,305.0 (*)    All other components within normal limits  TROPONIN I - Abnormal; Notable for the following:    Troponin I 0.03 (*)    All other components within normal limits   ____________________________________________  EKG  ED ECG REPORT I, Kelly,JADE J, the attending physician, personally viewed and interpreted this ECG.   Date: 03/15/2017  EKG Time: 0207  Rate: 88  Rhythm: normal EKG, normal sinus rhythm  Axis: Normal  Intervals:first-degree A-V block   ST&T Change: New T-wave inversion in lead V5; new from 11/2015  ____________________________________________  RADIOLOGY  Dg Chest Port 1 View  Result Date: 03/15/2017 CLINICAL DATA:  Acute onset of shortness of breath. Initial encounter. EXAM: PORTABLE CHEST 1 VIEW COMPARISON:  Chest radiograph performed 11/18/2016 FINDINGS: The lungs are well-aerated. Pulmonary vascularity is at the upper limits of normal. There is no evidence of focal  opacification, pleural effusion or pneumothorax. The cardiomediastinal silhouette is mildly enlarged. An AICD is noted overlying the left chest wall, with a single lead ending overlying the right ventricle. No acute osseous abnormalities are seen. IMPRESSION: Mild cardiomegaly.  Lungs remain grossly clear. Electronically Signed   By: Roanna Raider M.D.   On: 03/15/2017 02:35    ____________________________________________   PROCEDURES  Procedure(s) performed: None  Procedures  Critical Care performed: No  ____________________________________________   INITIAL IMPRESSION / ASSESSMENT AND PLAN / ED COURSE  Pertinent labs & imaging results that were available during my care of the patient were reviewed by me and considered in my medical decision making (see chart for details).  61 year old male with congestive heart failure who presents with mild respiratory distress. Most likely having CHF exacerbation. He is tripoding with bibasilar rails. Will initiate nebulizer treatment,  Bumex as patient has CKD and reassess. He was placed on 2 L nasal cannula oxygen for comfort. Anticipate hospitalization.  Clinical Course as of Mar 15 513  Sat Mar 15, 2017  1610 Patient appears slightly more comfortable. Updated patient and spouse on the laboratory and imaging results. Will discuss with hospitalist to evaluate patient in the emergency department for admission.  [JS]  0348 BP 154/114; nitroglycerin paste ordered.  [JS]  [id number] Patient has changes mind and now refuses admission. States he overall feels better and is "ready to go". He was ambulatory with steady gait maintaining room air oxygenation 97%. We discussed risk/benefits of him leaving, including death and disability. I strongly urged him to stay for hospitalization, especially given his extremely elevated blood pressure. He declines and will be leaving AGAINST MEDICAL ADVICE. Strict return precautions given. Patient verbalizes understanding and agrees with plan of care.  [JS]    Clinical Course User Index [JS] Irean Hong, MD     ____________________________________________   FINAL CLINICAL IMPRESSION(S) / ED DIAGNOSES  Final diagnoses:  Shortness of breath  Acute on chronic congestive heart failure, unspecified heart failure type (HCC)  Acute renal failure superimposed on chronic kidney disease, unspecified CKD stage, unspecified acute renal failure type [facility])  Essential hypertension      NEW MEDICATIONS STARTED DURING THIS VISIT:  Discharge Medication List as of 03/15/2017  4:37 AM       Note:  This document was prepared using Dragon voice recognition software and may include unintentional dictation errors.    Irean Hong, MD 03/15/17 [phone]

## 2017-03-15 NOTE — ED Triage Notes (Signed)
Pt presents to ED with sob since Thursday night. Hx of CHF with pacemaker. Pt has obvious increased work of breathing noted at this time. Occasional cough.

## 2017-03-15 NOTE — ED Notes (Signed)
Pt ambulated around RN station O2 sats increased from 97% on room air to 99-100% ambulating  Pt requesting to leave AMA. Pt reports wanting sleep in his own bed and feels "much better now"  Dr Dolores Frame notified

## 2017-03-15 NOTE — Discharge Instructions (Signed)
You are leaving AGAINST MEDICAL ADVICE. Please return to the ER if you change your mind or experience worsening symptoms including chest pain, persistent vomiting, shortness of breath or any concerns.

## 2017-03-17 ENCOUNTER — Encounter: Payer: Self-pay | Admitting: Family Medicine

## 2017-03-17 ENCOUNTER — Ambulatory Visit (INDEPENDENT_AMBULATORY_CARE_PROVIDER_SITE_OTHER): Payer: BLUE CROSS/BLUE SHIELD | Admitting: Family Medicine

## 2017-03-17 VITALS — BP 119/82 | HR 72 | Temp 97.8°F | Wt 137.0 lb

## 2017-03-17 DIAGNOSIS — N184 Chronic kidney disease, stage 4 (severe): Secondary | ICD-10-CM | POA: Diagnosis not present

## 2017-03-17 DIAGNOSIS — I5022 Chronic systolic (congestive) heart failure: Secondary | ICD-10-CM | POA: Diagnosis not present

## 2017-03-17 MED ORDER — ALBUTEROL SULFATE HFA 108 (90 BASE) MCG/ACT IN AERS: 2.0000 | INHALATION_SPRAY | Freq: Four times a day (QID) | RESPIRATORY_TRACT | 3 refills | Status: DC | PRN

## 2017-03-17 NOTE — Progress Notes (Signed)
BP 119/82   Pulse 72   Temp 97.8 F (36.6 C)   Wt 137 lb (62.1 kg)   SpO2 100%   BMI 20.23 kg/m    Subjective:    Patient ID: Kelly Leonard, male    DOB: May 30, 1956, 61 y.o.   MRN: [id number]  HPI: Kelly Leonard is a 61 y.o. male  Chief Complaint  Patient presents with  . Follow-up    ED follow up. Was seen for CHF flare. Still has SOB, feeling dizzy, weak, nausea, slight headache, diarrhea.   . Hypertension    Only taking the Coreg when his BP is elevated, took it last yesterday.   Patient presents for ER f/u for CHF exacerbation. Was found to be significantly SOB in ER, tripoding and with b/l basilar rales on exam. Labs were notable for elevated BNP and a Cr of 2.92, which is slightly up from baseline. Still having some mild SOB, dizziness, weakness, HAs but much better since d/c. Denies CP, LE edema, significant orthopnea. Taking valsartan, lasix, and coreg (this one only as needed). Establishing with a new Cardiologist and Nephrologist next week d/t insurance changes. Working on improving low salt diet, exercise, and wife has him using essential oils and natural supplements to help with his sxs.   Relevant past medical, surgical, family and social history reviewed and updated as indicated. Interim medical history since our last visit reviewed. Allergies and medications reviewed and updated.  Review of Systems  Constitutional: Positive for fatigue.  HENT: Negative.   Eyes: Negative.   Respiratory: Positive for shortness of breath.   Cardiovascular: Negative.   Genitourinary: Negative.   Musculoskeletal: Negative.   Neurological: Positive for dizziness and headaches.  Psychiatric/Behavioral: Negative.     Per HPI unless specifically indicated above     Objective:    BP 119/82   Pulse 72   Temp 97.8 F (36.6 C)   Wt 137 lb (62.1 kg)   SpO2 100%   BMI 20.23 kg/m   Wt Readings from Last 3 Encounters:  03/17/17 137 lb (62.1 kg)  03/15/17 136 lb (61.7 kg)    02/12/17 136 lb (61.7 kg)    Physical Exam  Constitutional: He is oriented to person, place, and time. He appears well-developed and well-nourished. No distress.  HENT:  Head: Atraumatic.  Eyes: Conjunctivae are normal. Pupils are equal, round, and reactive to light.  Neck: Normal range of motion. Neck supple.  Cardiovascular: Normal rate and normal heart sounds.   Pulmonary/Chest: Breath sounds normal. No respiratory distress. He has no rales.  Musculoskeletal: Normal range of motion.  Neurological: He is alert and oriented to person, place, and time.  Skin: Skin is warm and dry.  Psychiatric: He has a normal mood and affect. His behavior is normal.  Nursing note and vitals reviewed.   Results for orders placed or performed during the hospital encounter of 03/15/17  CBC with Differential  Result Value Ref Range   WBC 7.9 3.8 - 10.6 K/uL   RBC 4.86 4.40 - 5.90 MIL/uL   Hemoglobin 13.7 13.0 - 18.0 g/dL   HCT 46.9 62.9 - 52.8 %   MCV 84.3 80.0 - 100.0 fL   MCH 28.1 26.0 - 34.0 pg   MCHC 33.3 32.0 - 36.0 g/dL   RDW 41.3 (H) 24.4 - 01.0 %   Platelets 238 150 - 440 K/uL   Neutrophils Relative % 65 %   Neutro Abs 5.1 1.4 - 6.5 K/uL   Lymphocytes Relative 25 %  Lymphs Abs 2.0 1.0 - 3.6 K/uL   Monocytes Relative 6 %   Monocytes Absolute 0.5 0.2 - 1.0 K/uL   Eosinophils Relative 3 %   Eosinophils Absolute 0.2 0 - 0.7 K/uL   Basophils Relative 1 %   Basophils Absolute 0.1 0 - 0.1 K/uL  Comprehensive metabolic panel  Result Value Ref Range   Sodium 136 135 - 145 mmol/L   Potassium 4.8 3.5 - 5.1 mmol/L   Chloride 103 101 - 111 mmol/L   CO2 24 22 - 32 mmol/L   Glucose, Bld 85 65 - 99 mg/dL   BUN 51 (H) 6 - 20 mg/dL   Creatinine, Ser 4.43 (H) 0.61 - 1.24 mg/dL   Calcium 9.4 8.9 - 15.4 mg/dL   Total Protein 8.4 (H) 6.5 - 8.1 g/dL   Albumin 4.0 3.5 - 5.0 g/dL   AST 45 (H) 15 - 41 U/L   ALT 27 17 - 63 U/L   Alkaline Phosphatase 60 38 - 126 U/L   Total Bilirubin 1.1 0.3 - 1.2  mg/dL   GFR calc non Af Amer 22 (L) >60 mL/min   GFR calc Af Amer 25 (L) >60 mL/min   Anion gap 9 5 - 15  Brain natriuretic peptide  Result Value Ref Range   B Natriuretic Peptide 1,305.0 (H) 0.0 - 100.0 pg/mL  Troponin I  Result Value Ref Range   Troponin I 0.03 (HH) <0.03 ng/mL      Assessment & Plan:   Problem List Items Addressed This Visit      Cardiovascular and Mediastinum   Chronic systolic congestive heart failure (HCC) - Primary    Continue current regimen, will reorder albuterol inhaler as pt endorses benefit with one he had previously but is now out. Await Cardiology f/u next week. Will not repeat labs today as previous labs drawn only 2 days ago.         Genitourinary   CKD (chronic kidney disease) stage 4, GFR 15-29 ml/min (HCC)    Await Nephrology OV next week. Continue current regimen.           Follow up plan: Return for as scheduled.

## 2017-03-19 NOTE — Assessment & Plan Note (Signed)
Await Nephrology OV next week. Continue current regimen.

## 2017-03-19 NOTE — Assessment & Plan Note (Signed)
Continue current regimen, will reorder albuterol inhaler as pt endorses benefit with one he had previously but is now out. Await Cardiology f/u next week. Will not repeat labs today as previous labs drawn only 2 days ago.

## 2017-03-24 ENCOUNTER — Encounter: Payer: Self-pay | Admitting: Unknown Physician Specialty

## 2017-03-24 ENCOUNTER — Ambulatory Visit (INDEPENDENT_AMBULATORY_CARE_PROVIDER_SITE_OTHER): Payer: BLUE CROSS/BLUE SHIELD | Admitting: Unknown Physician Specialty

## 2017-03-24 VITALS — BP 127/84 | HR 88 | Temp 97.7°F | Ht 69.2 in | Wt 140.1 lb

## 2017-03-24 DIAGNOSIS — Z7189 Other specified counseling: Secondary | ICD-10-CM | POA: Diagnosis not present

## 2017-03-24 DIAGNOSIS — Q613 Polycystic kidney, unspecified: Secondary | ICD-10-CM

## 2017-03-24 DIAGNOSIS — I5022 Chronic systolic (congestive) heart failure: Secondary | ICD-10-CM | POA: Diagnosis not present

## 2017-03-24 DIAGNOSIS — Z0000 Encounter for general adult medical examination without abnormal findings: Secondary | ICD-10-CM | POA: Diagnosis not present

## 2017-03-24 DIAGNOSIS — R0781 Pleurodynia: Secondary | ICD-10-CM | POA: Diagnosis not present

## 2017-03-24 DIAGNOSIS — N184 Chronic kidney disease, stage 4 (severe): Secondary | ICD-10-CM | POA: Diagnosis not present

## 2017-03-24 DIAGNOSIS — I10 Essential (primary) hypertension: Secondary | ICD-10-CM

## 2017-03-24 MED ORDER — VALSARTAN 160 MG PO TABS: 160.0000 mg | ORAL_TABLET | Freq: Every day | ORAL | 1 refills | Status: DC

## 2017-03-24 MED ORDER — SERTRALINE HCL 25 MG PO TABS: 25.0000 mg | ORAL_TABLET | Freq: Every day | ORAL | 1 refills | Status: DC

## 2017-03-24 NOTE — Progress Notes (Signed)
BP 127/84   Pulse 88   Temp 97.7 F (36.5 C)   Ht 5' 9.2" (1.758 m)   Wt 140 lb 1.6 oz (63.5 kg)   SpO2 97%   BMI 20.57 kg/m    Subjective:    Patient ID: Kelly Leonard, male    DOB: August 29, 1956, 61 y.o.   MRN: [id number]  HPI: Kelly Leonard is a 61 y.o. male  Chief Complaint  Patient presents with  . Annual Exam    Depression screen [facility] 2/9 03/24/2017 02/12/2017  Decreased Interest 0 0  Down, Depressed, Hopeless 0 0  PHQ - 2 Score 0 0  Altered sleeping 0 -  Tired, decreased energy 1 -  Change in appetite 0 -  Feeling bad or failure about yourself  0 -  Trouble concentrating 0 -  Moving slowly or fidgety/restless 0 -  Suicidal thoughts 0 -  PHQ-9 Score 1 -   CHF Went to the ER 7/7.  Still has periods of SOB at work with bending down and standing up.  He did go to the ER for CHF.    Relevant past medical, surgical, family and social history reviewed and updated as indicated. Interim medical history since our last visit reviewed. Allergies and medications reviewed and updated.  Review of Systems  Constitutional: Negative.   HENT: Negative.   Eyes: Negative.   Respiratory: Negative.   Cardiovascular: Negative.   Gastrointestinal:       Pain left lower rib cage    Per HPI unless specifically indicated above     Objective:    BP 127/84   Pulse 88   Temp 97.7 F (36.5 C)   Ht 5' 9.2" (1.758 m)   Wt 140 lb 1.6 oz (63.5 kg)   SpO2 97%   BMI 20.57 kg/m   Wt Readings from Last 3 Encounters:  03/24/17 140 lb 1.6 oz (63.5 kg)  03/17/17 137 lb (62.1 kg)  03/15/17 136 lb (61.7 kg)    Physical Exam  Constitutional: He is oriented to person, place, and time. He appears well-developed and well-nourished. No distress.  HENT:  Head: Normocephalic and atraumatic.  Eyes: Conjunctivae and lids are normal. Right eye exhibits no discharge. Left eye exhibits no discharge. No scleral icterus.  Neck: Normal range of motion. Neck supple. No JVD present. Carotid  bruit is not present.  Cardiovascular: Normal rate, regular rhythm and normal heart sounds.   Pulmonary/Chest: Effort normal and breath sounds normal. No respiratory distress.  Abdominal: Normal appearance. There is no splenomegaly or hepatomegaly. There is tenderness in the left upper quadrant.  Appears muscular  Musculoskeletal: Normal range of motion.  Neurological: He is alert and oriented to person, place, and time.  Skin: Skin is warm, dry and intact. No rash noted. No pallor.  Psychiatric: He has a normal mood and affect. His behavior is normal. Judgment and thought content normal.    Results for orders placed or performed during the hospital encounter of 03/15/17  CBC with Differential  Result Value Ref Range   WBC 7.9 3.8 - 10.6 K/uL   RBC 4.86 4.40 - 5.90 MIL/uL   Hemoglobin 13.7 13.0 - 18.0 g/dL   HCT 95.2 84.1 - 32.4 %   MCV 84.3 80.0 - 100.0 fL   MCH 28.1 26.0 - 34.0 pg   MCHC 33.3 32.0 - 36.0 g/dL   RDW 40.1 (H) 02.7 - 25.3 %   Platelets 238 150 - 440 K/uL   Neutrophils Relative % 65 %  Neutro Abs 5.1 1.4 - 6.5 K/uL   Lymphocytes Relative 25 %   Lymphs Abs 2.0 1.0 - 3.6 K/uL   Monocytes Relative 6 %   Monocytes Absolute 0.5 0.2 - 1.0 K/uL   Eosinophils Relative 3 %   Eosinophils Absolute 0.2 0 - 0.7 K/uL   Basophils Relative 1 %   Basophils Absolute 0.1 0 - 0.1 K/uL  Comprehensive metabolic panel  Result Value Ref Range   Sodium 136 135 - 145 mmol/L   Potassium 4.8 3.5 - 5.1 mmol/L   Chloride 103 101 - 111 mmol/L   CO2 24 22 - 32 mmol/L   Glucose, Bld 85 65 - 99 mg/dL   BUN 51 (H) 6 - 20 mg/dL   Creatinine, Ser 7.53 (H) 0.61 - 1.24 mg/dL   Calcium 9.4 8.9 - 00.5 mg/dL   Total Protein 8.4 (H) 6.5 - 8.1 g/dL   Albumin 4.0 3.5 - 5.0 g/dL   AST 45 (H) 15 - 41 U/L   ALT 27 17 - 63 U/L   Alkaline Phosphatase 60 38 - 126 U/L   Total Bilirubin 1.1 0.3 - 1.2 mg/dL   GFR calc non Af Amer 22 (L) >60 mL/min   GFR calc Af Amer 25 (L) >60 mL/min   Anion gap 9 5 -  15  Brain natriuretic peptide  Result Value Ref Range   B Natriuretic Peptide 1,305.0 (H) 0.0 - 100.0 pg/mL  Troponin I  Result Value Ref Range   Troponin I 0.03 (HH) <0.03 ng/mL      Assessment & Plan:   Problem List Items Addressed This Visit      Unprioritized   Advanced care planning/counseling discussion    A voluntary discussion about advance care planning including the explanation and discussion of advance directives was extensively discussed  with the patient.  Explanation about the health care proxy and Living will was reviewed and packet with forms with explanation of how to fill them out was given.  During this discussion, the patient was able to identify a health care proxy as his wife and plans to fill out the paperwork required.  Patient was offered a separate Advance Care Planning visit for further assistance with forms.         Chronic systolic congestive heart failure (HCC)    This is stable. Appt pending for cardiology later this month      Relevant Medications   valsartan (DIOVAN) 160 MG tablet   CKD (chronic kidney disease) stage 4, GFR 15-29 ml/min (HCC)   Relevant Orders   VITAMIN D 25 Hydroxy (Vit-D Deficiency, Fractures)   PTH, Intact and Calcium   Hypertension - Primary    Stable, continue present medications.        Relevant Medications   valsartan (DIOVAN) 160 MG tablet   Polycystic kidney    Pending visit with nephrology       Other Visit Diagnoses    Rib pain on left side       Due to strain.  Discussed supportive care   Routine general medical examination at a health care facility       Relevant Orders   PSA       Follow up plan: Return in about 6 months (around 09/24/2017), or if symptoms worsen or fail to improve.

## 2017-03-24 NOTE — Assessment & Plan Note (Addendum)
Pending visit with nephrology

## 2017-03-24 NOTE — Assessment & Plan Note (Signed)
Stable, continue present medications.   

## 2017-03-24 NOTE — Assessment & Plan Note (Signed)
A voluntary discussion about advance care planning including the explanation and discussion of advance directives was extensively discussed  with the patient.  Explanation about the health care proxy and Living will was reviewed and packet with forms with explanation of how to fill them out was given.  During this discussion, the patient was able to identify a health care proxy as his wife and plans to fill out the paperwork required.  Patient was offered a separate Advance Care Planning visit for further assistance with forms.    

## 2017-03-24 NOTE — Assessment & Plan Note (Signed)
This is stable. Appt pending for cardiology later this month

## 2017-03-25 LAB — SPECIMEN STATUS

## 2017-03-25 NOTE — Progress Notes (Signed)
Wyline Mood MD, MRCP(U.K) [address]  [address]  [place], [place] [address]  Main: [phone]  Fax: [phone]   Gastroenterology Consultation  Referring Provider:     Gabriel Cirri, NP Primary Care Physician:  Gabriel Cirri, NP Primary Gastroenterologist:  Dr. Wyline Mood  Reason for Consultation:     Hepatitis C        HPI:   Kelly Leonard is a 61 y.o. y/o male referred for consultation & management  by Dr. Gabriel Cirri, NP.     He has been referred for hepatitis C antibody positive. HIV negative. Creatinine 2.92  Never known to have had hepatitis C and  Was checked as a routine health measure.   No tatoos, no blood transfusion. Sniffed cocaine 30 years back . Did not consume alcohol in excess, never smoked, did not serve in the forces.   No other complaints.    Past Medical History:  Diagnosis Date  . Anxiety   . CHF (congestive heart failure) (HCC)   . Chronic systolic congestive heart failure (HCC)   . CKD (chronic kidney disease) stage 4, GFR 15-29 ml/min (HCC)   . COPD (chronic obstructive pulmonary disease) (HCC)   . Dilated cardiomyopathy (HCC)   . Hyperlipidemia   . Hypertension   . MI (myocardial infarction) (HCC)   . Polycystic kidney   . Renal disorder     Past Surgical History:  Procedure Laterality Date  . CARDIAC CATHETERIZATION    . IMPLANTABLE CARDIOVERTER DEFIBRILLATOR (ICD) GENERATOR CHANGE Right 06/23/2015   Procedure: ICD GENERATOR  INITIAL IMPLANT;  Surgeon: Sharion Settler, MD;  Location: [facility] ORS;  Service: Cardiovascular;  Laterality: Right;    Prior to Admission medications   Medication Sig Start Date End Date Taking? Authorizing Provider  albuterol (PROVENTIL HFA;VENTOLIN HFA) 108 (90 Base) MCG/ACT inhaler Inhale 2 puffs into the lungs every 6 (six) hours as needed for wheezing or shortness of breath. 03/17/17  Yes Particia Nearing, PA-C  aspirin EC 81 MG tablet Take 81 mg by mouth daily.   Yes [provider]  furosemide (LASIX) 40 MG tablet Take 2 tablets (80 mg total) by mouth daily. 02/12/17  Yes Gabriel Cirri, NP  sertraline (ZOLOFT) 25 MG tablet Take 1 tablet (25 mg total) by mouth daily. 03/24/17  Yes Gabriel Cirri, NP  valsartan (DIOVAN) 160 MG tablet Take 1 tablet (160 mg total) by mouth daily. Patient not taking: Reported on 03/26/2017 03/24/17   Gabriel Cirri, NP    Family History  Problem Relation Age of Onset  . Diabetes Mother   . Hypertension Brother   . Heart disease Maternal Grandmother      Social History  Substance Use Topics  . Smoking status: Never Smoker  . Smokeless tobacco: Never Used  . Alcohol use No     Comment: on occasion    Allergies as of 03/26/2017 - Review Complete 03/26/2017  Allergen Reaction Noted  . Penicillins Anaphylaxis 03/01/2014  . Ace inhibitors Rash 12/07/2014  . Carvedilol Other (See Comments) 03/24/2017    Review of Systems:    All systems reviewed and negative except where noted in HPI.   Physical Exam:  BP 135/85   Pulse 80   Temp 97.8 F (36.6 C) (Oral)   Ht 5\' 8"  (1.727 m)   Wt 138 lb 6.4 oz (62.8 kg)   BMI 21.04 kg/m  No LMP for male patient. Psych:  Alert and cooperative. Normal mood and affect. General:  Alert,  Well-developed, well-nourished, pleasant and cooperative in NAD Head:  Normocephalic and atraumatic. Eyes:  Sclera clear, no icterus.   Conjunctiva pink. Ears:  Normal auditory acuity. Nose:  No deformity, discharge, or lesions. Mouth:  No deformity or lesions,oropharynx pink & moist. Neck:  Supple; no masses or thyromegaly. Lungs:  Respirations even and unlabored.  Clear throughout to auscultation.   No wheezes, crackles, or rhonchi. No acute distress. Heart:  Regular rate and rhythm; no murmurs, clicks, rubs, or gallops. Abdomen:  Normal bowel sounds.  No bruits.  Soft, non-tender and non-distended without masses, hepatosplenomegaly or hernias noted.  No guarding or rebound tenderness.      Extremities:  No clubbing or edema.  No cyanosis. Neurologic:  Alert and oriented x3;  grossly normal neurologically. Skin:  Intact without significant lesions or rashes. No jaundice. Lymph Nodes:  No significant cervical adenopathy. Psych:  Alert and cooperative. Normal mood and affect.  Imaging Studies: Dg Chest Port 1 View  Result Date: 03/15/2017 CLINICAL DATA:  Acute onset of shortness of breath. Initial encounter. EXAM: PORTABLE CHEST 1 VIEW COMPARISON:  Chest radiograph performed 11/18/2016 FINDINGS: The lungs are well-aerated. Pulmonary vascularity is at the upper limits of normal. There is no evidence of focal opacification, pleural effusion or pneumothorax. The cardiomediastinal silhouette is mildly enlarged. An AICD is noted overlying the left chest wall, with a single lead ending overlying the right ventricle. No acute osseous abnormalities are seen. IMPRESSION: Mild cardiomegaly.  Lungs remain grossly clear. Electronically Signed   By: Roanna Raider M.D.   On: 03/15/2017 02:35    Assessment and Plan:   Kelly Leonard is a 61 y.o. y/o male has been referred for a positive hepatitis C antibody   Plan  1. Check hep C viral load ,gentoype to confirm he does have hepatitis C since 5% can clear the virus spontaneously  2. Hepatitis B surface antibody, antigen, e antibody/antigen and core antibody,INR 3. Based on the results will discuss treatment options. If he does have a positive viral load will also need a liver ultrasound electrography to determine degree of liver fibrosis.   Follow up in 6 weeks .   Dr Wyline Mood MD,MRCP(U.K)

## 2017-03-26 ENCOUNTER — Encounter: Payer: Self-pay | Admitting: Gastroenterology

## 2017-03-26 ENCOUNTER — Ambulatory Visit (INDEPENDENT_AMBULATORY_CARE_PROVIDER_SITE_OTHER): Payer: BLUE CROSS/BLUE SHIELD | Admitting: Gastroenterology

## 2017-03-26 VITALS — BP 135/85 | HR 80 | Temp 97.8°F | Ht 68.0 in | Wt 138.4 lb

## 2017-03-26 DIAGNOSIS — R768 Other specified abnormal immunological findings in serum: Secondary | ICD-10-CM | POA: Diagnosis not present

## 2017-03-26 LAB — SPECIMEN STATUS REPORT

## 2017-03-28 ENCOUNTER — Encounter: Payer: Self-pay | Admitting: Unknown Physician Specialty

## 2017-03-28 LAB — SPECIMEN STATUS REPORT

## 2017-03-28 LAB — VITAMIN D 25 HYDROXY (VIT D DEFICIENCY, FRACTURES): VIT D 25 HYDROXY: 38.1 ng/mL (ref 30.0–100.0)

## 2017-03-28 LAB — CALCIUM: Calcium: 9.5 mg/dL (ref 8.6–10.2)

## 2017-03-28 LAB — PSA: PROSTATE SPECIFIC AG, SERUM: 0.9 ng/mL (ref 0.0–4.0)

## 2017-03-31 LAB — CBC WITH DIFFERENTIAL/PLATELET
BASOS ABS: 0.1 10*3/uL (ref 0.0–0.2)
BASOS: 1 %
EOS (ABSOLUTE): 0.2 10*3/uL (ref 0.0–0.4)
Eos: 3 %
Hematocrit: 41.5 % (ref 37.5–51.0)
Hemoglobin: 13.1 g/dL (ref 13.0–17.7)
Immature Grans (Abs): 0 10*3/uL (ref 0.0–0.1)
Immature Granulocytes: 0 %
LYMPHS ABS: 2.5 10*3/uL (ref 0.7–3.1)
Lymphs: 32 %
MCH: 27.2 pg (ref 26.6–33.0)
MCHC: 31.6 g/dL (ref 31.5–35.7)
MCV: 86 fL (ref 79–97)
Monocytes Absolute: 0.8 10*3/uL (ref 0.1–0.9)
Monocytes: 10 %
NEUTROS ABS: 4.2 10*3/uL (ref 1.4–7.0)
Neutrophils: 54 %
Platelets: 409 10*3/uL — ABNORMAL HIGH (ref 150–379)
RBC: 4.81 x10E6/uL (ref 4.14–5.80)
RDW: 15.4 % (ref 12.3–15.4)
WBC: 7.7 10*3/uL (ref 3.4–10.8)

## 2017-03-31 LAB — SPECIMEN STATUS REPORT

## 2017-04-01 LAB — PTH, INTACT AND CALCIUM: PTH: 46 pg/mL (ref 15–65)

## 2017-04-05 LAB — HEPATITIS B CORE ANTIBODY, TOTAL: Hep B Core Total Ab: POSITIVE — AB

## 2017-04-05 LAB — HEPATITIS C GENOTYPE

## 2017-04-05 LAB — HEPATITIS B E ANTIBODY: Hep B E Ab: NEGATIVE

## 2017-04-05 LAB — HEPATITIS B SURFACE ANTIGEN: HEP B S AG: NEGATIVE

## 2017-04-05 LAB — HEPATITIS A ANTIBODY, TOTAL: Hep A Total Ab: NEGATIVE

## 2017-04-05 LAB — HEPATITIS B E ANTIGEN: Hep B E Ag: NEGATIVE

## 2017-04-09 ENCOUNTER — Telehealth: Payer: Self-pay

## 2017-04-09 ENCOUNTER — Telehealth: Payer: Self-pay | Admitting: Unknown Physician Specialty

## 2017-04-09 ENCOUNTER — Other Ambulatory Visit: Payer: Self-pay

## 2017-04-09 DIAGNOSIS — R768 Other specified abnormal immunological findings in serum: Secondary | ICD-10-CM

## 2017-04-09 NOTE — Telephone Encounter (Signed)
Kelly Leonard, would we be able to do labs here and then send the results to the patient's GI doctor? (They are going to call back with what labs need to be done)

## 2017-04-09 NOTE — Telephone Encounter (Signed)
Advised of results per Dr. Tobi Bastos.   - needs hep A vaccine - needs labs for Hep C viral load and Hep B  Lab orders sent to PCP office for draw per patient's request.

## 2017-04-09 NOTE — Telephone Encounter (Signed)
Not sure.  It depends if I have the diagnosis that support the lab orders.  The [facility] drawing station in [place] is easier to get in and out then it is here.

## 2017-04-10 NOTE — Telephone Encounter (Signed)
Telephone encounter in patient's chart where gastro sent [place] lab orders via fax. I sent the CMA that documented this phone call a staff message because we have not received any orders yet. I asked for her to please resend them to [phone].

## 2017-04-11 ENCOUNTER — Encounter: Payer: Self-pay | Admitting: Family Medicine

## 2017-04-11 ENCOUNTER — Other Ambulatory Visit: Payer: Self-pay

## 2017-04-11 ENCOUNTER — Other Ambulatory Visit: Payer: Self-pay | Admitting: Unknown Physician Specialty

## 2017-04-11 ENCOUNTER — Ambulatory Visit (INDEPENDENT_AMBULATORY_CARE_PROVIDER_SITE_OTHER): Payer: BLUE CROSS/BLUE SHIELD | Admitting: Family Medicine

## 2017-04-11 VITALS — BP 126/86 | HR 83 | Temp 97.5°F | Wt 141.0 lb

## 2017-04-11 DIAGNOSIS — R768 Other specified abnormal immunological findings in serum: Secondary | ICD-10-CM

## 2017-04-11 DIAGNOSIS — R0602 Shortness of breath: Secondary | ICD-10-CM | POA: Diagnosis not present

## 2017-04-11 DIAGNOSIS — I5022 Chronic systolic (congestive) heart failure: Secondary | ICD-10-CM | POA: Diagnosis not present

## 2017-04-11 MED ORDER — PREDNISONE 20 MG PO TABS
40.0000 mg | ORAL_TABLET | Freq: Every day | ORAL | 0 refills | Status: DC
Start: 2017-04-11 — End: 2017-05-05

## 2017-04-11 NOTE — Assessment & Plan Note (Signed)
New referral to Cardiology placed as old one did not go through.

## 2017-04-11 NOTE — Telephone Encounter (Signed)
yes

## 2017-04-11 NOTE — Telephone Encounter (Signed)
Orders drawn, patient came in for appointment. Labs will be sent to GI on Monday when they come back. Papers on my desk as a reminder.

## 2017-04-11 NOTE — Progress Notes (Signed)
BP 126/86   Pulse 83   Temp (!) 97.5 F (36.4 C)   Wt 141 lb (64 kg)   SpO2 100%   BMI 21.44 kg/m    Subjective:    Patient ID: Kelly Leonard, male    DOB: 1956/07/19, 61 y.o.   MRN: [id number]  HPI: Kelly Leonard is a 61 y.o. male  Chief Complaint  Patient presents with  . Congestive Heart Failure    flare never really went away, but worse over the last 3 days. feels congested  . Shortness of Breath    gets really SOB especially at night.   . Hypertension    Dr.Singh prescribed him Losartan 25mg  but he hasn't started it yet. They were concerned about the side effects.   Patient presents today for 3 days of worsening SOB. Sxs worst at night, regardless of if laying flat or in recliner. Notes wheezing, whistling sounds when breathing. Has had some cold sxs off and on the past few weeks, and can occasionally cough up some phlegm. Denies fever, chills, CP. Compliant with his lasix, 40 mg BID. No swelling noted. Notes the albuterol helps in the short term. Tried his wife's advair several times which helped quite a bit.   Does note that his BPs have been inconsistent lately. Nephrology added losartan prn but he hadn't yet started it as he was concerned about side effects.   Relevant past medical, surgical, family and social history reviewed and updated as indicated. Interim medical history since our last visit reviewed. Allergies and medications reviewed and updated.  Review of Systems  Constitutional: Positive for fatigue.  HENT: Positive for congestion and sore throat.   Eyes: Negative.   Respiratory: Positive for shortness of breath.   Cardiovascular: Negative.   Gastrointestinal: Negative.   Genitourinary: Negative.   Musculoskeletal: Negative.   Neurological: Negative.   Psychiatric/Behavioral: Negative.    Per HPI unless specifically indicated above     Objective:    BP 126/86   Pulse 83   Temp (!) 97.5 F (36.4 C)   Wt 141 lb (64 kg)   SpO2 100%   BMI  21.44 kg/m   Wt Readings from Last 3 Encounters:  04/11/17 141 lb (64 kg)  03/26/17 138 lb 6.4 oz (62.8 kg)  03/24/17 140 lb 1.6 oz (63.5 kg)    Physical Exam  Constitutional: He is oriented to person, place, and time. He appears well-developed and well-nourished.  HENT:  Head: Atraumatic.  Right Ear: External ear normal.  Left Ear: External ear normal.  Nasal mucosa and oropharynx injected  Eyes: Pupils are equal, round, and reactive to light. Conjunctivae are normal.  Neck: Normal range of motion. Neck supple.  Cardiovascular: Normal rate and normal heart sounds.   Pulmonary/Chest: Effort normal and breath sounds normal. No respiratory distress. He has no wheezes. He has no rales.  Musculoskeletal: Normal range of motion.  Neurological: He is alert and oriented to person, place, and time.  Skin: Skin is warm and dry.  Psychiatric: He has a normal mood and affect. His behavior is normal.  Nursing note and vitals reviewed.  Results for orders placed or performed in visit on 03/26/17  Hepatitis A Ab, Total  Result Value Ref Range   Hep A Total Ab Negative Negative  Hepatitis B Core Antibody, total  Result Value Ref Range   Hep B Core Total Ab Positive (A) Negative  Hepatitis B Surface AntiGEN  Result Value Ref Range   Hepatitis B  Surface Ag Negative Negative  Hepatitis B E Antibody  Result Value Ref Range   Hep B E Ab Negative Negative  Hepatitis B E Antigen  Result Value Ref Range   Hep B E Ag Negative Negative  Hepatitis C Genotype  Result Value Ref Range   Hepatitis C Genotype 1b    Please Note (HCV): Comment       Assessment & Plan:   Problem List Items Addressed This Visit      Cardiovascular and Mediastinum   Chronic systolic congestive heart failure (HCC)    New referral to Cardiology placed as old one did not go through.       Relevant Medications   losartan (COZAAR) 25 MG tablet   Other Relevant Orders   Ambulatory referral to Cardiology    Other  Visit Diagnoses    SOB (shortness of breath)    -  Primary   Suspect inflammatory flare from allergies or recent URI more than edema from CHF. Prednisone burst, symbicort sample given today. F/u if no improvement   Hepatitis B antibody positive       Ordered by Dr. Tobi Bastos with GI. Will get drawn today   Hepatitis C antibody test positive           Follow up plan: Return if symptoms worsen or fail to improve.

## 2017-04-11 NOTE — Telephone Encounter (Signed)
Received labs from GI doctor. They need a Hep B surface antibody and HCV RT-PCR Quant (Non-Graph) drawn. Diagnosis code provided on orders. Is it OK to do these labs here?

## 2017-04-11 NOTE — Patient Instructions (Signed)
Follow up in 1 week by phone with how you're doing

## 2017-04-17 ENCOUNTER — Ambulatory Visit: Payer: BLUE CROSS/BLUE SHIELD | Admitting: Family Medicine

## 2017-04-21 ENCOUNTER — Encounter: Payer: Self-pay | Admitting: Unknown Physician Specialty

## 2017-04-21 LAB — HCV RT-PCR, QUANT (NON-GRAPH)
HCV QUANT: 1600000 {copies}/mL
HCV QUANTITATIVE BASELINE: 640000 IU/mL
HCV Quantitative Log: 6.204 Log10copy/mL
IU log10: 5.806 Log10 IU/mL

## 2017-04-21 LAB — HEPATITIS B SURFACE ANTIBODY,QUALITATIVE: Hep B Surface Ab, Qual: NONREACTIVE

## 2017-04-28 ENCOUNTER — Encounter: Payer: Self-pay | Admitting: Unknown Physician Specialty

## 2017-05-02 ENCOUNTER — Encounter: Payer: Self-pay | Admitting: Emergency Medicine

## 2017-05-02 ENCOUNTER — Emergency Department: Payer: BLUE CROSS/BLUE SHIELD

## 2017-05-02 ENCOUNTER — Telehealth: Payer: Self-pay

## 2017-05-02 ENCOUNTER — Emergency Department
Admission: EM | Admit: 2017-05-02 | Discharge: 2017-05-02 | Disposition: A | Discharge status: Home / Self Care | Payer: BLUE CROSS/BLUE SHIELD | Attending: Emergency Medicine | Admitting: Emergency Medicine

## 2017-05-02 ENCOUNTER — Telehealth: Payer: Self-pay | Admitting: Unknown Physician Specialty

## 2017-05-02 DIAGNOSIS — N184 Chronic kidney disease, stage 4 (severe): Secondary | ICD-10-CM | POA: Diagnosis not present

## 2017-05-02 DIAGNOSIS — I5023 Acute on chronic systolic (congestive) heart failure: Secondary | ICD-10-CM | POA: Diagnosis not present

## 2017-05-02 DIAGNOSIS — I119 Hypertensive heart disease without heart failure: Secondary | ICD-10-CM | POA: Diagnosis not present

## 2017-05-02 DIAGNOSIS — I259 Chronic ischemic heart disease, unspecified: Secondary | ICD-10-CM | POA: Diagnosis not present

## 2017-05-02 DIAGNOSIS — I129 Hypertensive chronic kidney disease with stage 1 through stage 4 chronic kidney disease, or unspecified chronic kidney disease: Secondary | ICD-10-CM | POA: Insufficient documentation

## 2017-05-02 DIAGNOSIS — I509 Heart failure, unspecified: Secondary | ICD-10-CM

## 2017-05-02 DIAGNOSIS — Z79899 Other long term (current) drug therapy: Secondary | ICD-10-CM | POA: Insufficient documentation

## 2017-05-02 DIAGNOSIS — R0602 Shortness of breath: Secondary | ICD-10-CM

## 2017-05-02 DIAGNOSIS — J449 Chronic obstructive pulmonary disease, unspecified: Secondary | ICD-10-CM | POA: Insufficient documentation

## 2017-05-02 DIAGNOSIS — Z7982 Long term (current) use of aspirin: Secondary | ICD-10-CM | POA: Diagnosis not present

## 2017-05-02 LAB — BASIC METABOLIC PANEL
Anion gap: 11 (ref 5–15)
BUN: 69 mg/dL — AB (ref 6–20)
CALCIUM: 9.4 mg/dL (ref 8.9–10.3)
CHLORIDE: 103 mmol/L (ref 101–111)
CO2: 23 mmol/L (ref 22–32)
CREATININE: 3.35 mg/dL — AB (ref 0.61–1.24)
GFR calc non Af Amer: 19 mL/min — ABNORMAL LOW (ref >60)
GFR, EST AFRICAN AMERICAN: 21 mL/min — AB (ref >60)
Glucose, Bld: 94 mg/dL (ref 65–99)
Potassium: 4.6 mmol/L (ref 3.5–5.1)
SODIUM: 137 mmol/L (ref 135–145)

## 2017-05-02 LAB — CBC
HCT: 39.2 % — ABNORMAL LOW (ref 40.0–52.0)
Hemoglobin: 13 g/dL (ref 13.0–18.0)
MCH: 26.6 pg (ref 26.0–34.0)
MCHC: 33.1 g/dL (ref 32.0–36.0)
MCV: 80.5 fL (ref 80.0–100.0)
PLATELETS: 287 10*3/uL (ref 150–440)
RBC: 4.87 MIL/uL (ref 4.40–5.90)
RDW: 14.8 % — AB (ref 11.5–14.5)
WBC: 7.8 10*3/uL (ref 3.8–10.6)

## 2017-05-02 LAB — TROPONIN I
TROPONIN I: 0.04 ng/mL — AB (ref <0.03)
Troponin I: 0.04 ng/mL (ref <0.03)

## 2017-05-02 LAB — BRAIN NATRIURETIC PEPTIDE: B Natriuretic Peptide: 2536 pg/mL — ABNORMAL HIGH (ref 0.0–100.0)

## 2017-05-02 MED ORDER — HYDRALAZINE HCL 20 MG/ML IJ SOLN: 10.0000 mg | Freq: Once | INTRAMUSCULAR | Status: DC

## 2017-05-02 MED ORDER — BUMETANIDE 0.25 MG/ML IJ SOLN
1.0000 mg | Freq: Once | INTRAMUSCULAR | Status: AC
  Administered 2017-05-02: 1 mg via INTRAVENOUS
  Filled 2017-05-02: qty 4

## 2017-05-02 MED ORDER — ONDANSETRON HCL 4 MG/2ML IJ SOLN
4.0000 mg | Freq: Once | INTRAMUSCULAR | Status: AC
  Administered 2017-05-02: 4 mg via INTRAVENOUS

## 2017-05-02 MED ORDER — ONDANSETRON HCL 4 MG/2ML IJ SOLN
INTRAMUSCULAR | Status: AC
  Filled 2017-05-02: qty 2

## 2017-05-02 NOTE — ED Notes (Signed)
Ambulated patient with pulse oximetry per MD Dolores Frame.  Patient maintained oxygen saturation of 96-100% on RA.  Patients heart rate maintained in the mid-to-high 80 bpm range.  Patient's respiratory rate increased from approx 18 to 31.  Patient reports decreased work of breathing as compared to prior to this ED visit.  MD Dolores Frame informed.

## 2017-05-02 NOTE — ED Notes (Signed)
Racquel RN to bedside to apply heart failure vest

## 2017-05-02 NOTE — Telephone Encounter (Signed)
Per pt chart, he is a patient [facility]. Hiraa in scheduling will follow up with patient to confirm.

## 2017-05-02 NOTE — Discharge Instructions (Signed)
Continue medicines as directed by your doctors. Please speak with your kidney doctor about possibly switching from Lasix to torsemide as your kidney function has gotten worse. Return to the ER for worsening symptoms, persistent vomiting, difficulty breathing or other concerns.

## 2017-05-02 NOTE — ED Notes (Signed)
X-ray at bedside

## 2017-05-02 NOTE — ED Triage Notes (Signed)
Pt to tx room via WC, report SOB over past 3 days, hx copd.  Pt reports productive cough as well w/ white sputum.

## 2017-05-02 NOTE — ED Provider Notes (Signed)
[facility] Emergency Department Provider Note   ____________________________________________   First MD Initiated Contact with Patient 05/02/17 [phone]     (approximate)  I have reviewed the triage vital signs and the nursing notes.   HISTORY  Chief Complaint Shortness of Breath    HPI Kelly Leonard is a 61 y.o. male who presents to the ED from home with a chief complaint of shortness of breath. Patient has a history of CHF on Lasix, dilated cardiomyopathy, CAD stage IV, ICD who reports increased shortness of breath over the past 3 days along with increased swelling in his legs and abdomen. Also reports cough productive of white sputum. Denies associated fever, chills, chest pain, abdominal pain, nausea, vomiting. Denies recent travel or trauma. Nothing makes his symptoms better. Supine position and exertion make his symptoms worse.   Past Medical History:  Diagnosis Date  . Anxiety   . CHF (congestive heart failure) (HCC)   . Chronic systolic congestive heart failure (HCC)   . CKD (chronic kidney disease) stage 4, GFR 15-29 ml/min (HCC)   . COPD (chronic obstructive pulmonary disease) (HCC)   . Dilated cardiomyopathy (HCC)   . Hyperlipidemia   . Hypertension   . MI (myocardial infarction) (HCC)   . Polycystic kidney   . Renal disorder     Patient Active Problem List   Diagnosis Date Noted  . Advanced care planning/counseling discussion 03/24/2017  . 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 Encounter to establish care 02/12/2017  . Hypertension   . Hyperlipidemia   . Anxiety   . Chronic systolic congestive heart failure (HCC)   . Dilated cardiomyopathy (HCC)   . Polycystic kidney   . CKD (chronic kidney disease) stage 4, GFR 15-29 ml/min (HCC)   . 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 Chronic systolic heart failure (HCC) 06/23/2015  . Anxiety disorder, unspecified 03/03/2013    Past Surgical  History:  Procedure Laterality Date  . CARDIAC CATHETERIZATION    . IMPLANTABLE CARDIOVERTER DEFIBRILLATOR (ICD) GENERATOR CHANGE Right 06/23/2015   Procedure: ICD GENERATOR  INITIAL IMPLANT;  Surgeon: Sharion Settler, MD;  Location: [facility] ORS;  Service: Cardiovascular;  Laterality: Right;    Prior to Admission medications   Medication Sig Start Date End Date Taking? Authorizing Provider  albuterol (PROVENTIL HFA;VENTOLIN HFA) 108 (90 Base) MCG/ACT inhaler Inhale 2 puffs into the lungs every 6 (six) hours as needed for wheezing or shortness of breath. 03/17/17   Particia Nearing, PA-C  aspirin EC 81 MG tablet Take 81 mg by mouth daily.    [provider]  furosemide (LASIX) 40 MG tablet Take 2 tablets (80 mg total) by mouth daily. 02/12/17   Gabriel Cirri, NP  losartan (COZAAR) 25 MG tablet Take 25 mg by mouth daily. 03/31/17   [provider]  predniSONE (DELTASONE) 20 MG tablet Take 2 tablets (40 mg total) by mouth daily with breakfast. 04/11/17   Particia Nearing, PA-C  sertraline (ZOLOFT) 25 MG tablet Take 1 tablet (25 mg total) by mouth daily. 03/24/17   Gabriel Cirri, NP  valsartan (DIOVAN) 160 MG tablet Take 1 tablet (160 mg total) by mouth daily. Patient not taking: Reported on 03/26/2017 03/24/17   Gabriel Cirri, NP    Allergies Penicillins; Ace inhibitors; and Carvedilol  Family History  Problem Relation Age of Onset  . Diabetes Mother   . Hypertension Brother   . Heart disease Maternal Grandmother     Social History  Social History  Substance Use Topics  . Smoking status: Never Smoker  . Smokeless tobacco: Never Used  . Alcohol use No     Comment: on occasion    Review of Systems  Constitutional: No fever/chills. Eyes: No visual changes. ENT: No sore throat. Cardiovascular: Denies chest pain. Respiratory: Positive for cough and shortness of breath. Gastrointestinal: No abdominal pain.  No nausea, no vomiting.  No diarrhea.  No  constipation. Genitourinary: Negative for dysuria. Musculoskeletal: Negative for back pain. Skin: Negative for rash. Neurological: Negative for headaches, focal weakness or numbness.   ____________________________________________   PHYSICAL EXAM:  VITAL SIGNS: ED Triage Vitals  Enc Vitals Group     BP 05/02/17 0130 (!) 157/114     Pulse Rate 05/02/17 0130 90     Resp 05/02/17 0130 13     Temp 05/02/17 0133 97.8 F (36.6 C)     Temp Source 05/02/17 0133 Oral     SpO2 05/02/17 0130 100 %     Weight 05/02/17 0127 140 lb (63.5 kg)     Height 05/02/17 0127 5\' 9"  (1.753 m)     Head Circumference --      Peak Flow --      Pain Score 05/02/17 0127 0     Pain Loc --      Pain Edu? --      Excl. in GC? --     Constitutional: Alert and oriented. Well appearing and in mild acute distress. Eyes: Conjunctivae are normal. PERRL. EOMI. Head: Atraumatic. Nose: No congestion/rhinnorhea. Mouth/Throat: Mucous membranes are moist.  Oropharynx non-erythematous. Neck: No stridor.   Cardiovascular: Normal rate, regular rhythm. Grossly normal heart sounds.  Good peripheral circulation. Respiratory: Normal respiratory effort.  No retractions. Lungs with bibasilar rales. Gastrointestinal: Soft and nontender. No distention. No abdominal bruits. No CVA tenderness. Musculoskeletal: No lower extremity tenderness. 2+ nonpitting BLE edema.  No joint effusions. Neurologic:  Normal speech and language. No gross focal neurologic deficits are appreciated.  Skin:  Skin is warm, dry and intact. No rash noted. Psychiatric: Mood and affect are normal. Speech and behavior are normal.  ____________________________________________   LABS (all labs ordered are listed, but only abnormal results are displayed)  Labs Reviewed  BASIC METABOLIC PANEL - Abnormal; Notable for the following:       Result Value   BUN 69 (*)    Creatinine, Ser 3.35 (*)    GFR calc non Af Amer 19 (*)    GFR calc Af Amer 21 (*)     All other components within normal limits  CBC - Abnormal; Notable for the following:    HCT 39.2 (*)    RDW 14.8 (*)    All other components within normal limits  TROPONIN I - Abnormal; Notable for the following:    Troponin I 0.04 (*)    All other components within normal limits  BRAIN NATRIURETIC PEPTIDE - Abnormal; Notable for the following:    B Natriuretic Peptide 2,536.0 (*)    All other components within normal limits  TROPONIN I - Abnormal; Notable for the following:    Troponin I 0.04 (*)    All other components within normal limits   ____________________________________________  EKG  ED ECG REPORT I, Franchesca Veneziano J, the attending physician, personally viewed and interpreted this ECG.   Date: 05/02/2017  EKG Time: 0131  Rate: 90  Rhythm: normal EKG, normal sinus rhythm  Axis: LAD  Intervals:left anterior fascicular block  ST&T Change: inverted T  waves inferior laterally which is unchanged from 03/2017  ____________________________________________  RADIOLOGY  Dg Chest Port 1 View  Result Date: 05/02/2017 CLINICAL DATA:  Shortness of breath.  Productive cough. EXAM: PORTABLE CHEST 1 VIEW COMPARISON:  03/15/2017 FINDINGS: A single lead ICD remains in place. The cardiac silhouette remains mildly enlarged. Minimal streaky opacity in the left lung base and in the right infrahilar region is similar to the prior study. No lobar consolidation, edema, sizable pleural effusion, or pneumothorax is identified. No acute osseous abnormality is seen. IMPRESSION: Minimal bibasilar opacity, likely atelectasis. Electronically Signed   By: Sebastian Ache M.D.   On: 05/02/2017 02:24    ____________________________________________   PROCEDURES  Procedure(s) performed: None  Procedures  Critical Care performed: No  ____________________________________________   INITIAL IMPRESSION / ASSESSMENT AND PLAN / ED COURSE  Pertinent labs & imaging results that were available during my care  of the patient were reviewed by me and considered in my medical decision making (see chart for details).  61 year old male with CHF who presents with exacerbation. Room air sats are 100% without exertion. Laboratory results demonstrate worsening renal insufficiency, BMP and minimally elevated troponin. Will trial 1 mg Bumex, repeat timed troponin and reassess.   Clinical Course as of May 03 611  Fri May 02, 2017  1610 Repeat troponin remains stable. His BMI was too low for CHF vest. Patient was ambulatory with pulse oximeter. Pulse ox on room air remained above 96%. He was not tachycardic nor dyspneic. Has urinated well since Bumex. Overall feels much better and eager for discharge. He is to call his nephrologist this morning for follow-up to discuss switching his Lasix to torsemide as he is having worsening renal function. Strict return depressions given. Patient and spouse verbalize understanding and agree with plan of care.  [JS]    Clinical Course User Index [JS] Irean Hong, MD     ____________________________________________   FINAL CLINICAL IMPRESSION(S) / ED DIAGNOSES  Final diagnoses:  Acute on chronic congestive heart failure, unspecified heart failure type (HCC)  SOB (shortness of breath)      NEW MEDICATIONS STARTED DURING THIS VISIT:  New Prescriptions   No medications on file     Note:  This document was prepared using Dragon voice recognition software and may include unintentional dictation errors.    Irean Hong, MD 05/02/17 [phone]

## 2017-05-02 NOTE — ED Notes (Signed)
Reviewed d/c instructions, follow-up care with patients. Pt verbalized understanding

## 2017-05-02 NOTE — Telephone Encounter (Signed)
Pt was seen in ED on 05/02/17 for SOB  He is currently scheduled for 06/10/17   Please advise if patient can be seen sooner.

## 2017-05-02 NOTE — Telephone Encounter (Signed)
Entered in error

## 2017-05-02 NOTE — ED Notes (Signed)
Patient c/o nausea. MD Dolores Frame informed

## 2017-05-02 NOTE — ED Notes (Signed)
Patient does not meet heart failure vest requirements (BMI to low)

## 2017-05-05 ENCOUNTER — Ambulatory Visit (INDEPENDENT_AMBULATORY_CARE_PROVIDER_SITE_OTHER): Payer: BLUE CROSS/BLUE SHIELD | Admitting: Unknown Physician Specialty

## 2017-05-05 ENCOUNTER — Encounter: Payer: Self-pay | Admitting: Unknown Physician Specialty

## 2017-05-05 DIAGNOSIS — N184 Chronic kidney disease, stage 4 (severe): Secondary | ICD-10-CM

## 2017-05-05 DIAGNOSIS — I420 Dilated cardiomyopathy: Secondary | ICD-10-CM | POA: Diagnosis not present

## 2017-05-05 DIAGNOSIS — F5101 Primary insomnia: Secondary | ICD-10-CM | POA: Diagnosis not present

## 2017-05-05 DIAGNOSIS — G4700 Insomnia, unspecified: Secondary | ICD-10-CM | POA: Insufficient documentation

## 2017-05-05 MED ORDER — TORSEMIDE 20 MG PO TABS: 40.0000 mg | ORAL_TABLET | Freq: Every day | ORAL | 2 refills | Status: DC

## 2017-05-05 NOTE — Progress Notes (Signed)
BP 130/88   Pulse 85   Temp 98.2 F (36.8 C)   Wt 142 lb 9.6 oz (64.7 kg)   SpO2 98%   BMI 21.06 kg/m    Subjective:    Patient ID: Kelly Leonard, male    DOB: [birth year], 61 y.o.   MRN: [id number]  HPI: Kelly Leonard is a 61 y.o. male  Chief Complaint  Patient presents with  . Hospitalization Follow-up    pt was seen at the ER for SOB, states he needs to have his furosemide changed because of his kidneys    Heart Failure Pt went to the ER due to SOB.  Pt states they were told he needs a different diuretic than Lasix as the Lasix is "too harsh on the kidneys" and are suggesting Torsemide instead of Lasix.  Ppt with cardiologist is not till October but seeing Nephrology September 5.  Pt is currently having no ankle swelling or SOB but "feels fluid" in abdomen.    ER notes reviewed and note the recommendations of changing from Lasix to Bumex  Insomnia Pt goes to bed at 8:30 and sometimes gets up early to go to work.  When he's in bed he wants to sleep.    Past Medical History:  Diagnosis Date  . Anxiety   . CHF (congestive heart failure) (HCC)   . Chronic systolic congestive heart failure (HCC)   . CKD (chronic kidney disease) stage 4, GFR 15-29 ml/min (HCC)   . COPD (chronic obstructive pulmonary disease) (HCC)   . Dilated cardiomyopathy (HCC)   . Hyperlipidemia   . Hypertension   . MI (myocardial infarction) (HCC)   . Polycystic kidney   . Renal disorder    Family History  Problem Relation Age of Onset  . Diabetes Mother   . Hypertension Brother   . Heart disease Maternal Grandmother      Relevant past medical, surgical, family and social history reviewed and updated as indicated. Interim medical history since our last visit reviewed. Allergies and medications reviewed and updated.  Review of Systems  Per HPI unless specifically indicated above     Objective:    BP 130/88   Pulse 85   Temp 98.2 F (36.8 C)   Wt 142 lb 9.6 oz (64.7 kg)   SpO2  98%   BMI 21.06 kg/m   Wt Readings from Last 3 Encounters:  05/05/17 142 lb 9.6 oz (64.7 kg)  05/02/17 140 lb (63.5 kg)  04/11/17 141 lb (64 kg)    Physical Exam  Constitutional: He is oriented to person, place, and time. He appears well-developed and well-nourished. No distress.  HENT:  Head: Normocephalic and atraumatic.  Eyes: Conjunctivae and lids are normal. Right eye exhibits no discharge. Left eye exhibits no discharge. No scleral icterus.  Neck: Normal range of motion. Neck supple. No JVD present. Carotid bruit is not present.  Cardiovascular: Normal rate, regular rhythm and normal heart sounds.   Pulmonary/Chest: Effort normal and breath sounds normal. No respiratory distress.  Abdominal: Normal appearance. There is no splenomegaly or hepatomegaly.  Musculoskeletal: Normal range of motion.  Neurological: He is alert and oriented to person, place, and time.  Skin: Skin is warm, dry and intact. No rash noted. No pallor.  Psychiatric: He has a normal mood and affect. His behavior is normal. Judgment and thought content normal.   Discussed with Dr. Laural Benes r/e dosing for Torsemide  Results for orders placed or performed during the hospital encounter of 05/02/17  Basic metabolic panel  Result Value Ref Range   Sodium 137 135 - 145 mmol/L   Potassium 4.6 3.5 - 5.1 mmol/L   Chloride 103 101 - 111 mmol/L   CO2 23 22 - 32 mmol/L   Glucose, Bld 94 65 - 99 mg/dL   BUN 69 (H) 6 - 20 mg/dL   Creatinine, Ser 6.57 (H) 0.61 - 1.24 mg/dL   Calcium 9.4 8.9 - 84.6 mg/dL   GFR calc non Af Amer 19 (L) >60 mL/min   GFR calc Af Amer 21 (L) >60 mL/min   Anion gap 11 5 - 15  CBC  Result Value Ref Range   WBC 7.8 3.8 - 10.6 K/uL   RBC 4.87 4.40 - 5.90 MIL/uL   Hemoglobin 13.0 13.0 - 18.0 g/dL   HCT 96.2 (L) 95.2 - 84.1 %   MCV 80.5 80.0 - 100.0 fL   MCH 26.6 26.0 - 34.0 pg   MCHC 33.1 32.0 - 36.0 g/dL   RDW 32.4 (H) 40.1 - 02.7 %   Platelets 287 150 - 440 K/uL  Troponin I  Result  Value Ref Range   Troponin I 0.04 (HH) <0.03 ng/mL  Brain natriuretic peptide  Result Value Ref Range   B Natriuretic Peptide 2,536.0 (H) 0.0 - 100.0 pg/mL  Troponin I  Result Value Ref Range   Troponin I 0.04 (HH) <0.03 ng/mL      Assessment & Plan:   Problem List Items Addressed This Visit      Unprioritized   CKD (chronic kidney disease) stage 4, GFR 15-29 ml/min (HCC)    Change Lasix to Torsemide.  Discussed with Dr. Laural Benes      Dilated cardiomyopathy [facility])    Pending cardiology referral      Relevant Medications   torsemide (DEMADEX) 20 MG tablet   Insomnia    Discussed Melatonin.            Follow up plan: Return for with nephrology on the 5th and cardiology in October.  Marland Kitchen

## 2017-05-05 NOTE — Assessment & Plan Note (Signed)
Discussed Melatonin.

## 2017-05-05 NOTE — Assessment & Plan Note (Signed)
Change Lasix to Torsemide.  Discussed with Dr. Laural Benes

## 2017-05-05 NOTE — Assessment & Plan Note (Signed)
Pending cardiology referral

## 2017-05-05 NOTE — Patient Instructions (Signed)
Melatonin .5 to 1 mg  Or  Dual release at 3 mg

## 2017-05-08 ENCOUNTER — Encounter: Payer: Self-pay | Admitting: Family Medicine

## 2017-05-08 ENCOUNTER — Ambulatory Visit (INDEPENDENT_AMBULATORY_CARE_PROVIDER_SITE_OTHER): Payer: BLUE CROSS/BLUE SHIELD | Admitting: Family Medicine

## 2017-05-08 VITALS — BP 121/91 | HR 88 | Wt 143.0 lb

## 2017-05-08 DIAGNOSIS — L509 Urticaria, unspecified: Secondary | ICD-10-CM | POA: Diagnosis not present

## 2017-05-08 DIAGNOSIS — I5022 Chronic systolic (congestive) heart failure: Secondary | ICD-10-CM | POA: Diagnosis not present

## 2017-05-08 DIAGNOSIS — F419 Anxiety disorder, unspecified: Secondary | ICD-10-CM

## 2017-05-08 MED ORDER — TRIAMCINOLONE ACETONIDE 0.1 % EX CREA: 1.0000 "application " | TOPICAL_CREAM | Freq: Two times a day (BID) | CUTANEOUS | 0 refills | Status: DC

## 2017-05-08 MED ORDER — TRIAMCINOLONE ACETONIDE 40 MG/ML IJ SUSP
40.0000 mg | Freq: Once | INTRAMUSCULAR | Status: AC
  Administered 2017-05-08: 40 mg via INTRAMUSCULAR

## 2017-05-08 NOTE — Patient Instructions (Signed)
Take 1 40 mg lasix in the morning, 1/2 tab 20 mg in the evening. DRINK PLENTY OF WATER

## 2017-05-08 NOTE — Progress Notes (Signed)
BP (!) 121/91   Pulse 88   Wt 143 lb (64.9 kg)   SpO2 100%   BMI 21.12 kg/m    Subjective:    Patient ID: Kelly Leonard, male    DOB: [birth year], 61 y.o.   MRN: [id number]  HPI: Kelly Leonard is a 61 y.o. male  Chief Complaint  Patient presents with  . Shortness of Breath  . Medication Problem    DEMADEX not agreeing w/ pt, Nausea and jittery    Patient presents for SOB, anxiousness, and insomnia for several days. Sxs started when he was switched from lasix to torsemide at 8/27 appt. States he took it once and had severe N/V and hives rash. Was off diuretics for several days but became fluid overloaded so put himself back on 80 mg lasix daily yesterday. Denies CP, palpitations, leg edema, urinary retention.   Past Medical History:  Diagnosis Date  . Anxiety   . CHF (congestive heart failure) (HCC)   . Chronic systolic congestive heart failure (HCC)   . CKD (chronic kidney disease) stage 4, GFR 15-29 ml/min (HCC)   . COPD (chronic obstructive pulmonary disease) (HCC)   . Dilated cardiomyopathy (HCC)   . Hyperlipidemia   . Hypertension   . MI (myocardial infarction) (HCC)   . Polycystic kidney   . Renal disorder    Social History   Social History  . Marital status: Single    Spouse name: N/A  . Number of children: N/A  . Years of education: N/A   Occupational History  . Not on file.   Social History Main Topics  . Smoking status: Never Smoker  . Smokeless tobacco: Never Used  . Alcohol use No     Comment: on occasion  . Drug use: No  . Sexual activity: Yes   Other Topics Concern  . Not on file   Social History Narrative  . No narrative on file   Relevant past medical, surgical, family and social history reviewed and updated as indicated. Interim medical history since our last visit reviewed. Allergies and medications reviewed and updated.  Review of Systems  Constitutional: Positive for fatigue.  HENT: Negative.   Respiratory: Positive for  shortness of breath.   Cardiovascular: Negative.   Gastrointestinal: Positive for nausea and vomiting.  Genitourinary: Negative.   Neurological: Negative.   Psychiatric/Behavioral: The patient is nervous/anxious and is hyperactive.    Per HPI unless specifically indicated above     Objective:    BP (!) 121/91   Pulse 88   Wt 143 lb (64.9 kg)   SpO2 100%   BMI 21.12 kg/m   Wt Readings from Last 3 Encounters:  05/08/17 143 lb (64.9 kg)  05/05/17 142 lb 9.6 oz (64.7 kg)  05/02/17 140 lb (63.5 kg)    Physical Exam  Constitutional: He is oriented to person, place, and time. He appears well-developed and well-nourished.  HENT:  Head: Atraumatic.  Eyes: Pupils are equal, round, and reactive to light. Conjunctivae are normal.  Neck: Normal range of motion. Neck supple.  Cardiovascular: Normal rate, regular rhythm, normal heart sounds and intact distal pulses.   Pulmonary/Chest: Effort normal and breath sounds normal. No respiratory distress. He has no rales.  Musculoskeletal: Normal range of motion. He exhibits no edema (no LE edema b/l).  Neurological: He is alert and oriented to person, place, and time.  Skin: Skin is warm and dry. Rash (Multiple patches of hives on upper back) noted.  Psychiatric:  Pt appears  very anxious, bouncing his legs at rest  Nursing note and vitals reviewed.     Assessment & Plan:   Problem List Items Addressed This Visit      Cardiovascular and Mediastinum   Chronic systolic heart failure (HCC)    Torsemide added to allergy list, restart lasix - 40 mg QAM and 20 mg QPM given concern for reduce renal function on 80 mg. Pt to see Nephrology in a few days, can re-assess diuresis at that time.        Other   Anxiety    Discussed that worsened anxiety likely from fluid overload and sensation of SOB despite excellent oxygenation. Will take benadryl BID prn for calming       Other Visit Diagnoses    Urticaria    -  Primary   Started after first  dos of torsemide. IM kenalog today, benadryl 1-2 times daily until resolved, topical triamcinolone given   Relevant Medications   triamcinolone acetonide (KENALOG-40) injection 40 mg (Completed)       Follow up plan: Return for Nephrology next week.

## 2017-05-11 NOTE — Assessment & Plan Note (Signed)
Discussed that worsened anxiety likely from fluid overload and sensation of SOB despite excellent oxygenation. Will take benadryl BID prn for calming

## 2017-05-11 NOTE — Assessment & Plan Note (Signed)
Torsemide added to allergy list, restart lasix - 40 mg QAM and 20 mg QPM given concern for reduce renal function on 80 mg. Pt to see Nephrology in a few days, can re-assess diuresis at that time.

## 2017-05-12 ENCOUNTER — Emergency Department: Payer: BLUE CROSS/BLUE SHIELD

## 2017-05-12 ENCOUNTER — Encounter: Payer: Self-pay | Admitting: Emergency Medicine

## 2017-05-12 ENCOUNTER — Inpatient Hospital Stay
Admission: EM | Admit: 2017-05-12 | Discharge: 2017-05-14 | DRG: 291 | Disposition: A | Discharge status: Home / Self Care | Payer: BLUE CROSS/BLUE SHIELD | Attending: Internal Medicine | Admitting: Internal Medicine

## 2017-05-12 DIAGNOSIS — E875 Hyperkalemia: Secondary | ICD-10-CM | POA: Diagnosis present

## 2017-05-12 DIAGNOSIS — Q613 Polycystic kidney, unspecified: Secondary | ICD-10-CM | POA: Diagnosis not present

## 2017-05-12 DIAGNOSIS — F419 Anxiety disorder, unspecified: Secondary | ICD-10-CM | POA: Diagnosis present

## 2017-05-12 DIAGNOSIS — N184 Chronic kidney disease, stage 4 (severe): Secondary | ICD-10-CM | POA: Diagnosis present

## 2017-05-12 DIAGNOSIS — Z888 Allergy status to other drugs, medicaments and biological substances status: Secondary | ICD-10-CM

## 2017-05-12 DIAGNOSIS — Z833 Family history of diabetes mellitus: Secondary | ICD-10-CM | POA: Diagnosis not present

## 2017-05-12 DIAGNOSIS — Z7982 Long term (current) use of aspirin: Secondary | ICD-10-CM

## 2017-05-12 DIAGNOSIS — Z79899 Other long term (current) drug therapy: Secondary | ICD-10-CM | POA: Diagnosis not present

## 2017-05-12 DIAGNOSIS — E785 Hyperlipidemia, unspecified: Secondary | ICD-10-CM | POA: Diagnosis present

## 2017-05-12 DIAGNOSIS — Z23 Encounter for immunization: Secondary | ICD-10-CM

## 2017-05-12 DIAGNOSIS — I252 Old myocardial infarction: Secondary | ICD-10-CM

## 2017-05-12 DIAGNOSIS — N179 Acute kidney failure, unspecified: Secondary | ICD-10-CM

## 2017-05-12 DIAGNOSIS — J449 Chronic obstructive pulmonary disease, unspecified: Secondary | ICD-10-CM | POA: Diagnosis present

## 2017-05-12 DIAGNOSIS — G2581 Restless legs syndrome: Secondary | ICD-10-CM | POA: Diagnosis present

## 2017-05-12 DIAGNOSIS — Z95810 Presence of automatic (implantable) cardiac defibrillator: Secondary | ICD-10-CM | POA: Diagnosis not present

## 2017-05-12 DIAGNOSIS — I130 Hypertensive heart and chronic kidney disease with heart failure and stage 1 through stage 4 chronic kidney disease, or unspecified chronic kidney disease: Principal | ICD-10-CM | POA: Diagnosis present

## 2017-05-12 DIAGNOSIS — E871 Hypo-osmolality and hyponatremia: Secondary | ICD-10-CM | POA: Diagnosis present

## 2017-05-12 DIAGNOSIS — Z880 Allergy status to penicillin: Secondary | ICD-10-CM | POA: Diagnosis not present

## 2017-05-12 DIAGNOSIS — I5023 Acute on chronic systolic (congestive) heart failure: Secondary | ICD-10-CM | POA: Diagnosis present

## 2017-05-12 DIAGNOSIS — I248 Other forms of acute ischemic heart disease: Secondary | ICD-10-CM | POA: Diagnosis present

## 2017-05-12 DIAGNOSIS — I361 Nonrheumatic tricuspid (valve) insufficiency: Secondary | ICD-10-CM | POA: Diagnosis not present

## 2017-05-12 DIAGNOSIS — B1920 Unspecified viral hepatitis C without hepatic coma: Secondary | ICD-10-CM | POA: Diagnosis present

## 2017-05-12 DIAGNOSIS — Z8249 Family history of ischemic heart disease and other diseases of the circulatory system: Secondary | ICD-10-CM | POA: Diagnosis not present

## 2017-05-12 DIAGNOSIS — I420 Dilated cardiomyopathy: Secondary | ICD-10-CM | POA: Diagnosis present

## 2017-05-12 DIAGNOSIS — R0602 Shortness of breath: Secondary | ICD-10-CM | POA: Diagnosis present

## 2017-05-12 DIAGNOSIS — I509 Heart failure, unspecified: Secondary | ICD-10-CM

## 2017-05-12 DIAGNOSIS — I340 Nonrheumatic mitral (valve) insufficiency: Secondary | ICD-10-CM | POA: Diagnosis not present

## 2017-05-12 DIAGNOSIS — N189 Chronic kidney disease, unspecified: Secondary | ICD-10-CM

## 2017-05-12 LAB — CBC
HCT: 36.5 % — ABNORMAL LOW (ref 40.0–52.0)
Hemoglobin: 12.1 g/dL — ABNORMAL LOW (ref 13.0–18.0)
MCH: 26 pg (ref 26.0–34.0)
MCHC: 33.1 g/dL (ref 32.0–36.0)
MCV: 78.6 fL — AB (ref 80.0–100.0)
PLATELETS: 376 10*3/uL (ref 150–440)
RBC: 4.64 MIL/uL (ref 4.40–5.90)
RDW: 14.6 % — ABNORMAL HIGH (ref 11.5–14.5)
WBC: 8.1 10*3/uL (ref 3.8–10.6)

## 2017-05-12 LAB — BASIC METABOLIC PANEL
ANION GAP: 13 (ref 5–15)
BUN: 85 mg/dL — ABNORMAL HIGH (ref 6–20)
CALCIUM: 9.1 mg/dL (ref 8.9–10.3)
CHLORIDE: 97 mmol/L — AB (ref 101–111)
CO2: 23 mmol/L (ref 22–32)
CREATININE: 3.78 mg/dL — AB (ref 0.61–1.24)
GFR calc non Af Amer: 16 mL/min — ABNORMAL LOW (ref >60)
GFR, EST AFRICAN AMERICAN: 19 mL/min — AB (ref >60)
Glucose, Bld: 100 mg/dL — ABNORMAL HIGH (ref 65–99)
Potassium: 5.2 mmol/L — ABNORMAL HIGH (ref 3.5–5.1)
Sodium: 133 mmol/L — ABNORMAL LOW (ref 135–145)

## 2017-05-12 LAB — TROPONIN I: TROPONIN I: 0.07 ng/mL — AB (ref <0.03)

## 2017-05-12 LAB — BRAIN NATRIURETIC PEPTIDE: B Natriuretic Peptide: >4500 pg/mL — ABNORMAL HIGH (ref 0.0–100.0)

## 2017-05-12 MED ORDER — IRBESARTAN 75 MG PO TABS
37.5000 mg | ORAL_TABLET | Freq: Every day | ORAL | Status: DC
  Administered 2017-05-13: 37.5 mg via ORAL
  Filled 2017-05-12 (×2): qty 0.5

## 2017-05-12 MED ORDER — LORAZEPAM 0.5 MG PO TABS
0.5000 mg | ORAL_TABLET | Freq: Once | ORAL | Status: AC
  Administered 2017-05-12: 0.5 mg via ORAL
  Filled 2017-05-12: qty 1

## 2017-05-12 MED ORDER — ENOXAPARIN SODIUM 30 MG/0.3ML ~~LOC~~ SOLN
30.0000 mg | SUBCUTANEOUS | Status: DC
  Administered 2017-05-12 – 2017-05-13 (×2): 30 mg via SUBCUTANEOUS
  Filled 2017-05-12 (×2): qty 0.3

## 2017-05-12 MED ORDER — FUROSEMIDE 10 MG/ML IJ SOLN
40.0000 mg | Freq: Once | INTRAMUSCULAR | Status: AC
  Administered 2017-05-12: 40 mg via INTRAVENOUS
  Filled 2017-05-12: qty 4

## 2017-05-12 MED ORDER — ACETAMINOPHEN 325 MG PO TABS: 650.0000 mg | ORAL_TABLET | Freq: Four times a day (QID) | ORAL | Status: DC | PRN

## 2017-05-12 MED ORDER — ONDANSETRON HCL 4 MG PO TABS: 4.0000 mg | ORAL_TABLET | Freq: Four times a day (QID) | ORAL | Status: DC | PRN

## 2017-05-12 MED ORDER — ONDANSETRON HCL 4 MG/2ML IJ SOLN: 4.0000 mg | Freq: Four times a day (QID) | INTRAMUSCULAR | Status: DC | PRN

## 2017-05-12 MED ORDER — PNEUMOCOCCAL VAC POLYVALENT 25 MCG/0.5ML IJ INJ
0.5000 mL | INJECTION | INTRAMUSCULAR | Status: AC
  Administered 2017-05-13: 0.5 mL via INTRAMUSCULAR
  Filled 2017-05-12: qty 0.5

## 2017-05-12 MED ORDER — ACETAMINOPHEN 650 MG RE SUPP
650.0000 mg | Freq: Four times a day (QID) | RECTAL | Status: DC | PRN
Start: 2017-05-12 — End: 2017-05-14

## 2017-05-12 MED ORDER — DOCUSATE SODIUM 100 MG PO CAPS
100.0000 mg | ORAL_CAPSULE | Freq: Two times a day (BID) | ORAL | Status: DC
  Administered 2017-05-12 – 2017-05-14 (×4): 100 mg via ORAL
  Filled 2017-05-12 (×4): qty 1

## 2017-05-12 MED ORDER — HYDROCODONE-ACETAMINOPHEN 5-325 MG PO TABS: 1.0000 | ORAL_TABLET | ORAL | Status: DC | PRN

## 2017-05-12 MED ORDER — FUROSEMIDE 10 MG/ML IJ SOLN
60.0000 mg | Freq: Four times a day (QID) | INTRAMUSCULAR | Status: DC
  Administered 2017-05-13 (×2): 60 mg via INTRAVENOUS
  Filled 2017-05-12 (×2): qty 6

## 2017-05-12 MED ORDER — LORAZEPAM 0.5 MG PO TABS
0.5000 mg | ORAL_TABLET | Freq: Three times a day (TID) | ORAL | Status: DC | PRN
  Administered 2017-05-12 – 2017-05-13 (×3): 0.5 mg via ORAL
  Filled 2017-05-12 (×3): qty 1

## 2017-05-12 MED ORDER — ASPIRIN EC 81 MG PO TBEC
81.0000 mg | DELAYED_RELEASE_TABLET | Freq: Every day | ORAL | Status: DC
  Administered 2017-05-13 – 2017-05-14 (×2): 81 mg via ORAL
  Filled 2017-05-12 (×2): qty 1

## 2017-05-12 MED ORDER — SERTRALINE HCL 50 MG PO TABS
25.0000 mg | ORAL_TABLET | Freq: Every day | ORAL | Status: DC
  Administered 2017-05-13: 25 mg via ORAL
  Filled 2017-05-12: qty 1

## 2017-05-12 MED ORDER — ALBUTEROL SULFATE (2.5 MG/3ML) 0.083% IN NEBU: 2.5000 mg | INHALATION_SOLUTION | Freq: Four times a day (QID) | RESPIRATORY_TRACT | Status: DC | PRN

## 2017-05-12 MED ORDER — BISACODYL 5 MG PO TBEC: 5.0000 mg | DELAYED_RELEASE_TABLET | Freq: Every day | ORAL | Status: DC | PRN

## 2017-05-12 NOTE — ED Notes (Signed)
Date and time results received: 05/12/17 1618   Test: troponin Critical Value: 0.07  Name of Provider Notified: Dr. Fanny Bien

## 2017-05-12 NOTE — ED Triage Notes (Signed)
Pt to ed with c/o sob, increased fluid retention over the past few days. Denies chest pain.

## 2017-05-12 NOTE — ED Notes (Signed)
Tech transported pt to room

## 2017-05-12 NOTE — H&P (Signed)
[facility] - [place] at [facility]   PATIENT NAME: Kelly Leonard    MR#:  [id number]  DATE OF BIRTH:  10-14-1955  DATE OF ADMISSION:  05/12/2017  PRIMARY CARE PHYSICIAN: Gabriel Cirri, NP   REQUESTING/REFERRING PHYSICIAN: Dr. Sharyn Creamer  CHIEF COMPLAINT: Shortness of breath.    Chief Complaint  Patient presents with  . Shortness of Breath    HISTORY OF PRESENT ILLNESS:  Kelly Leonard  is a 62 y.o. male with a known history of htn , dilated cardiomyopathy, congestive heart failure, COPD, hyperlipidemia, hypertension, chronic kidney disease stage III comes in because of shortness of breath getting progressively worse for the past 1 month. Hypotension told me that he is been having the shortness of breath, no asymmetry edema. And he was given torsemide that caused urticaria and he told me he could not tolerate the torsemide because of that. Went to PCP on August 30, started taking 80 mg of Lasix daily. Nausea or vomiting. Main complaint today is shortness of breath, leg edema. BNP is more than 4500 today.  Past Medical History:  Diagnosis Date  . Anxiety   . CHF (congestive heart failure) (HCC)   . Chronic systolic congestive heart failure (HCC)   . CKD (chronic kidney disease) stage 4, GFR 15-29 ml/min (HCC)   . COPD (chronic obstructive pulmonary disease) (HCC)   . Dilated cardiomyopathy (HCC)   . Hyperlipidemia   . Hypertension   . MI (myocardial infarction) (HCC)   . Polycystic kidney   . Renal disorder     PAST SURGICAL HISTOIRY:   Past Surgical History:  Procedure Laterality Date  . CARDIAC CATHETERIZATION    . IMPLANTABLE CARDIOVERTER DEFIBRILLATOR (ICD) GENERATOR CHANGE Right 06/23/2015   Procedure: ICD GENERATOR  INITIAL IMPLANT;  Surgeon: Sharion Settler, MD;  Location: [facility] ORS;  Service: Cardiovascular;  Laterality: Right;    SOCIAL HISTORY:   Social History  Substance Use Topics  . Smoking status: Never Smoker  . Smokeless  tobacco: Never Used  . Alcohol use No     Comment: on occasion    FAMILY HISTORY:   Family History  Problem Relation Age of Onset  . Diabetes Mother   . Hypertension Brother   . Heart disease Maternal Grandmother     DRUG ALLERGIES:   Allergies  Allergen Reactions  . Penicillins Anaphylaxis and Swelling    Has patient had a PCN reaction causing immediate rash, facial/tongue/throat swelling, SOB or lightheadedness with hypotension: Yes Has patient had a PCN reaction causing severe rash involving mucus membranes or skin necrosis: No Has patient had a PCN reaction that required hospitalization: No Has patient had a PCN reaction occurring within the last 10 years: No If all of the above answers are "NO", then may proceed with Cephalosporin use.   . Ace Inhibitors Rash  . Carvedilol Other (See Comments)    "dizziness, light headed, and nausea"  . Torsemide Hives, Itching, Nausea And Vomiting and Rash    REVIEW OF SYSTEMS:  CONSTITUTIONAL: No fever, fatigue or weakness.  EYES: No blurred or double vision.  EARS, NOSE, AND THROAT: No tinnitus or ear pain.  RESPIRATORY;Shortness of breath, CARDIOVASCULAR: No chest pain, o does have orthopnea, PND.Marland Kitchen  GASTROINTESTINAL: No nausea, vomiting, diarrhea or abdominal pain.  GENITOURINARY: No dysuria, hematuria.  ENDOCRINE: No polyuria, nocturia,  HEMATOLOGY: No anemia, easy bruising or bleeding SKIN: No rash or lesion. MUSCULOSKELETAL: No joint pain or arthritis.h  has erythema as leg edema  NEUROLOGIC:  No tingling, numbness, weakness.  PSYCHIATRY: No anxiety or depression.   MEDICATIONS AT HOME:   Prior to Admission medications   Medication Sig Start Date End Date Taking? Authorizing Provider  albuterol (PROVENTIL HFA;VENTOLIN HFA) 108 (90 Base) MCG/ACT inhaler Inhale 2 puffs into the lungs every 6 (six) hours as needed for wheezing or shortness of breath. 03/17/17  Yes Particia Nearing, PA-C  aspirin EC 81 MG tablet Take 81  mg by mouth daily.   Yes [provider]  furosemide (LASIX) 40 MG tablet Take 60 mg by mouth daily.   Yes [provider]  sertraline (ZOLOFT) 25 MG tablet Take 1 tablet (25 mg total) by mouth daily. 03/24/17  Yes Gabriel Cirri, NP  triamcinolone cream (KENALOG) 0.1 % Apply 1 application topically 2 (two) times daily. Patient not taking: Reported on 05/12/2017 05/08/17   Particia Nearing, PA-C  valsartan (DIOVAN) 160 MG tablet Take 1 tablet (160 mg total) by mouth daily. Patient not taking: Reported on 05/12/2017 03/24/17   Gabriel Cirri, NP      VITAL SIGNS:  Blood pressure (!) 146/95, pulse 92, temperature (!) 97.5 F (36.4 C), resp. rate 18, height 5\' 9"  (1.753 m), weight 64.9 kg (143 lb), SpO2 100 %.  PHYSICAL EXAMINATION:  GENERAL:  61 y.o.-year-old patient lying in the bed with no acute distress.  EYES: Pupils equal, round, reactive to light . No scleral icterus. Extraocular muscles intact.  HEENT: Head atraumatic, normocephalic. Oropharynx and nasopharynx clear.  NECK:  Supple, no jugular venous distention. No thyroid enlargement, no tenderness.  LUNG;bilateral basilar crepitations present. CARDIOVASCULAR: S1, S2 normal. No murmurs, rubs, or gallops.  ABDOMEN: Soft, nontender, nondistended. Bowel sounds present. No organomegaly or mass.  EXTREMITIEplus pitting edema bilaterally. NEUROLOGIC: Cranial nerves II through XII are intact. Muscle strength 5/5 in all extremities. Sensation intact. Gait not checked.  PSYCHIATRIC: The patient is alert and oriented x 3.  SKIN: No obvious rash, lesion, or ulcer.   LABORATORY PANEL:   CBC  Recent Labs Lab 05/12/17 1525  WBC 8.1  HGB 12.1*  HCT 36.5*  PLT 376   ------------------------------------------------------------------------------------------------------------------  Chemistries   Recent Labs Lab 05/12/17 1525  NA 133*  K 5.2*  CL 97*  CO2 23  GLUCOSE 100*  BUN 85*  CREATININE 3.78*  CALCIUM  9.1   ------------------------------------------------------------------------------------------------------------------  Cardiac Enzymes  Recent Labs Lab 05/12/17 1525  TROPONINI 0.07*   ------------------------------------------------------------------------------------------------------------------  RADIOLOGY:  Dg Chest 2 View  Result Date: 05/12/2017 CLINICAL DATA:  Dyspnea EXAM: CHEST  2 VIEW COMPARISON:  05/02/2017 chest radiograph. FINDINGS: Stable configuration of single lead left subclavian ICD. Stable cardiomediastinal silhouette with mild cardiomegaly. No pneumothorax. No pleural effusion. Lungs appear clear, with no acute consolidative airspace disease and no pulmonary edema. IMPRESSION: Stable mild cardiomegaly without overt pulmonary edema. No active pulmonary disease. Electronically Signed   By: Delbert Phenix M.D.   On: 05/12/2017 15:55    EKG:   Orders placed or performed during the hospital encounter of 05/12/17  . ED EKG  . ED EKG  . EKG 12-Lead  . EKG 12-Lead  EKG shows LVH, early repolarization and normal sinus rhythm 90 bpm  Plan;: 61 year old male patient with dilated cardiomyopathy has shortness of breath, orthopnea, PND, failed extra dose of oral Lasix comes in with persistent shortness of breath, elevated BNP: Admitted to hospitalist service, start IV Lasix, cardiology consult with Dr.Arida.pt is in process of swtiching to [place] [facility] due to insurance reasons. Echocardiogram done  2 years ago showed EF 30-30%. Patient has ICD placed long time back. Sodium diet, check daily weights, follow up at CHF clinic.   #2 acute on chronic kidney disease stage III:  increased from 3.32 3.78. Consult nephrology. 3.recent urticaria due to torsemide. Received Benadryl, Kenalog and topical triamcinolone.  Hr  had urticaria after first dose of torsemide.  4. history of hepatitis C. Follows up with Dr. Wyline Mood    All the records are reviewed and case discussed  with ED provider. Management plans discussed with the patient, family and they are in agreement.  CODE STATUS: full  TOTAL TIME TAKING CARE OF THIS PATIENT: 55 minutes.    Katha Hamming M.D on 05/12/2017 at 7:37 PM  Between 7am to 6pm - Pager - [phone]  After 6pm go to [email or web address] - password [email or web address]  [facility]  Office  [phone]  CC: Primary care physician; Gabriel Cirri, NP  Note: This dictation was prepared with Dragon dictation along with smaller phrase technology. Any transcriptional errors that result from this process are unintentional.

## 2017-05-12 NOTE — ED Notes (Signed)
Pt states that he has a history of congestive heart failure and is in pain and cant' breath. 3rd time he has been here this month. Family at bedside.

## 2017-05-12 NOTE — ED Provider Notes (Signed)
[facility] Emergency Department Provider Note   ____________________________________________   First MD Initiated Contact with Patient 05/12/17 [phone]     (approximate)  I have reviewed the triage vital signs and the nursing notes.   HISTORY  Chief Complaint Shortness of Breath    HPI Kelly Leonard is a 61 y.o. male parts increasing shortness of breath for at least a week  Unless told he had congestive heart failure, he's increased his Lasix to 40 mg twice a day. He was recently seen in the emergency room about a week ago for the same and it seems to be worsening. Reports he hasn't slept for 3 nights because whenever he lays down very short of breath. No nausea or vomiting. Denies any chest pain. 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 Park Breed for cardiology starting in October  Past Medical History:  Diagnosis Date  . Anxiety   . CHF (congestive heart failure) (HCC)   . Chronic systolic congestive heart failure (HCC)   . CKD (chronic kidney disease) stage 4, GFR 15-29 ml/min (HCC)   . COPD (chronic obstructive pulmonary disease) (HCC)   . Dilated cardiomyopathy (HCC)   . Hyperlipidemia   . Hypertension   . MI (myocardial infarction) (HCC)   . Polycystic kidney   . Renal disorder     Patient Active Problem List   Diagnosis Date Noted  . Acute CHF (congestive heart failure) (HCC) 05/12/2017  . Insomnia 05/05/2017  . Advanced care planning/counseling discussion 03/24/2017  . 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 Hypertension   . Hyperlipidemia   . Anxiety   . Chronic systolic congestive heart failure (HCC)   . Dilated cardiomyopathy (HCC)   . Polycystic kidney   . CKD (chronic kidney disease)  stage 4, GFR 15-29 ml/min (HCC)   . 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 Chronic systolic heart failure (HCC) 06/23/2015  . Anxiety disorder, unspecified 03/03/2013    Past Surgical History:  Procedure Laterality Date  . CARDIAC CATHETERIZATION    . IMPLANTABLE CARDIOVERTER DEFIBRILLATOR (ICD) GENERATOR CHANGE Right 06/23/2015   Procedure: ICD GENERATOR  INITIAL IMPLANT;  Surgeon: Sharion Settler, MD;  Location: [facility] ORS;  Service: Cardiovascular;  Laterality: Right;    Prior to Admission medications   Medication Sig Start Date End Date Taking? Authorizing Provider  albuterol (PROVENTIL HFA;VENTOLIN HFA) 108 (90 Base) MCG/ACT inhaler Inhale 2 puffs into the lungs every 6 (six) hours as needed for wheezing or shortness of breath. 03/17/17  Yes Particia Nearing, PA-C  aspirin EC 81 MG tablet Take 81 mg by mouth daily.   Yes [provider]  furosemide (LASIX) 40 MG tablet Take 60 mg by mouth daily.   Yes [provider]  sertraline (ZOLOFT) 25 MG tablet Take 1 tablet (25 mg total) by mouth daily. 03/24/17  Yes Gabriel Cirri, NP  triamcinolone cream (KENALOG) 0.1 % Apply 1 application topically 2 (two) times daily. Patient not taking: Reported on 05/12/2017 05/08/17   Particia Nearing, PA-C  valsartan (DIOVAN) 160 MG tablet Take 1 tablet (160 mg total) by mouth daily. Patient not taking: Reported on 05/12/2017 03/24/17   Gabriel Cirri,  NP    Allergies Penicillins; Ace inhibitors; Carvedilol; and Torsemide  Family History  Problem Relation Age of Onset  . Diabetes Mother   . Hypertension Brother   . Heart disease Maternal Grandmother     Social History Social History  Substance Use Topics  . Smoking status: Never Smoker  . Smokeless tobacco: Never Used  . Alcohol use No     Comment: on occasion    Review of Systems Constitutional: No fever/chills Eyes: No visual changes. ENT: No sore throat. Cardiovascular: Denies chest  pain. Respiratory: the history of present illnessGastrointestinal: No abdominal pain.  No nausea, no vomiting.  No diarrhea.  No constipation. Genitourinary: Negative for dysuria. Musculoskeletal: Negative for back pain. Skin: Negative for rash. Neurological: Negative for headaches, focal weakness or numbness.    ____________________________________________   PHYSICAL EXAM:  VITAL SIGNS: ED Triage Vitals  Enc Vitals Group     BP 05/12/17 1521 (!) 139/94     Pulse Rate 05/12/17 1521 87     Resp 05/12/17 1521 18     Temp 05/12/17 1521 97.6 F (36.4 C)     Temp Source 05/12/17 1521 Oral     SpO2 05/12/17 1521 100 %     Weight 05/12/17 1522 143 lb (64.9 kg)     Height 05/12/17 1522 5\' 9"  (1.753 m)     Head Circumference --      Peak Flow --      Pain Score 05/12/17 1521 0     Pain Loc --      Pain Edu? --      Excl. in GC? --     Constitutional: Alert and oriented. slightly anxious, standing up reports he feels short of breath with standing and has difficulty laying down Eyes: Conjunctivae are normal. Head: Atraumatic. Nose: No congestion/rhinnorhea. Mouth/Throat: Mucous membranes are moist. Neck: No stridor.  JVD the level of the mandible bilateral while sitting upright Cardiovascular: Normal rate, regular rhythm. Grossly normal heart sounds.  Good peripheral circulation. Respiratory: mild increased work of breathing. Speaks in full sentences without retractions No retractions. Lungs but diminished in the bases Gastrointestinal: Soft and nontender. No distention. Musculoskeletal: No lower extremity tenderness but 2+ pitting edema bilateral Neurologic:  Normal speech and language. No gross focal neurologic deficits are appreciated.  Skin:  Skin is warm, dry and intact. No rash noted. Psychiatric: Mood and affect are normal. Speech and behavior are normal.  ____________________________________________   LABS (all labs ordered are listed, but only abnormal results are  displayed)  Labs Reviewed  BASIC METABOLIC PANEL - Abnormal; Notable for the following:       Result Value   Sodium 133 (*)    Potassium 5.2 (*)    Chloride 97 (*)    Glucose, Bld 100 (*)    BUN 85 (*)    Creatinine, Ser 3.78 (*)    GFR calc non Af Amer 16 (*)    GFR calc Af Amer 19 (*)    All other components within normal limits  CBC - Abnormal; Notable for the following:    Hemoglobin 12.1 (*)    HCT 36.5 (*)    MCV 78.6 (*)    RDW 14.6 (*)    All other components within normal limits  TROPONIN I - Abnormal; Notable for the following:    Troponin I 0.07 (*)    All other components within normal limits  BRAIN NATRIURETIC PEPTIDE - Abnormal; Notable for the following:    B  Natriuretic Peptide >4,500.0 (*)    All other components within normal limits  BASIC METABOLIC PANEL  CBC  CBC  CREATININE, SERUM  TROPONIN I  TROPONIN I  TROPONIN I   ____________________________________________  EKG  reviewed and interpreted by me at 1525 Heart rate 90 Care is 100 QTc 460 Normal sinus rhythm, probable left ventricular hypertrophy with early repolarization change, does not appear consistent with STE ____________________________________________  RADIOLOGY  Dg Chest 2 View  Result Date: 05/12/2017 CLINICAL DATA:  Dyspnea EXAM: CHEST  2 VIEW COMPARISON:  05/02/2017 chest radiograph. FINDINGS: Stable configuration of single lead left subclavian ICD. Stable cardiomediastinal silhouette with mild cardiomegaly. No pneumothorax. No pleural effusion. Lungs appear clear, with no acute consolidative airspace disease and no pulmonary edema. IMPRESSION: Stable mild cardiomegaly without overt pulmonary edema. No active pulmonary disease. Electronically Signed   By: Delbert Phenix M.D.   On: 05/12/2017 15:55    ____________________________________________   PROCEDURES  Procedure(s) performed: None  Procedures  Critical Care performed:  No  ____________________________________________   INITIAL IMPRESSION / ASSESSMENT AND PLAN / ED COURSE  Pertinent labs & imaging results that were available during my care of the patient were reviewed by me and considered in my medical decision making (see chart for details).  dyspnea. Exam appears most consistent with CHF and also his history. BNP quite elevated. Chest x-ray demonstrates no acute pulmonary edema but based on her symptomatology, worsening renal function, and significantly elevated BNP with worsening despite increased Lasix outpatient I discussed with Dr. Kirke Corin of cardiology. He recommends admission, cardiology consult and IV Lasix. I would agree and the patient is agreeable with plan for admission      ____________________________________________   FINAL CLINICAL IMPRESSION(S) / ED DIAGNOSES  Final diagnoses:  SOB (shortness of breath)  Acute on chronic congestive heart failure, unspecified heart failure type (HCC)  hyperkalemia, mild Acute on chronic kidney injury    NEW MEDICATIONS STARTED DURING THIS VISIT:  New Prescriptions   No medications on file     Note:  This document was prepared using Dragon voice recognition software and may include unintentional dictation errors.     Sharyn Creamer, MD 05/12/17 [id number]

## 2017-05-12 NOTE — Progress Notes (Signed)
Anticoagulation monitoring(Lovenox):  61 yo male ordered Lovenox 40 mg Q24h  Filed Weights   05/12/17 1522  Weight: 143 lb (64.9 kg)   BMI    Lab Results  Component Value Date   CREATININE 3.78 (H) 05/12/2017   CREATININE 3.35 (H) 05/02/2017   CREATININE 2.92 (H) 03/15/2017   Estimated Creatinine Clearance: 19.1 mL/min (A) (by C-G formula based on SCr of 3.78 mg/dL (H)). Hemoglobin & Hematocrit     Component Value Date/Time   HGB 12.1 (L) 05/12/2017 1525   HGB WILL FOLLOW 03/24/2017 1605   HGB 13.1 03/24/2017 1605   HCT 36.5 (L) 05/12/2017 1525   HCT WILL FOLLOW 03/24/2017 1605   HCT 41.5 03/24/2017 1605     Per Protocol for Patient with estCrcl < 30 ml/min and BMI < 40, will transition to Lovenox 30 mg Q24.

## 2017-05-13 ENCOUNTER — Inpatient Hospital Stay: Payer: BLUE CROSS/BLUE SHIELD

## 2017-05-13 ENCOUNTER — Inpatient Hospital Stay (HOSPITAL_COMMUNITY)
Admit: 2017-05-13 | Discharge: 2017-05-13 | Disposition: A | Discharge status: Home / Self Care | Payer: BLUE CROSS/BLUE SHIELD | Attending: Internal Medicine | Admitting: Internal Medicine

## 2017-05-13 DIAGNOSIS — I361 Nonrheumatic tricuspid (valve) insufficiency: Secondary | ICD-10-CM

## 2017-05-13 DIAGNOSIS — I340 Nonrheumatic mitral (valve) insufficiency: Secondary | ICD-10-CM

## 2017-05-13 LAB — BASIC METABOLIC PANEL
Anion gap: 15 (ref 5–15)
BUN: 93 mg/dL — AB (ref 6–20)
CO2: 22 mmol/L (ref 22–32)
CREATININE: 4.13 mg/dL — AB (ref 0.61–1.24)
Calcium: 9.4 mg/dL (ref 8.9–10.3)
Chloride: 98 mmol/L — ABNORMAL LOW (ref 101–111)
GFR calc Af Amer: 17 mL/min — ABNORMAL LOW (ref >60)
GFR calc non Af Amer: 14 mL/min — ABNORMAL LOW (ref >60)
GLUCOSE: 102 mg/dL — AB (ref 65–99)
Potassium: 4.6 mmol/L (ref 3.5–5.1)
Sodium: 135 mmol/L (ref 135–145)

## 2017-05-13 LAB — CBC
HCT: 36.7 % — ABNORMAL LOW (ref 40.0–52.0)
HEMOGLOBIN: 12 g/dL — AB (ref 13.0–18.0)
MCH: 25.8 pg — AB (ref 26.0–34.0)
MCHC: 32.7 g/dL (ref 32.0–36.0)
MCV: 79 fL — ABNORMAL LOW (ref 80.0–100.0)
PLATELETS: 375 10*3/uL (ref 150–440)
RBC: 4.65 MIL/uL (ref 4.40–5.90)
RDW: 14.6 % — AB (ref 11.5–14.5)
WBC: 8.2 10*3/uL (ref 3.8–10.6)

## 2017-05-13 LAB — ECHOCARDIOGRAM COMPLETE
Height: 69 in
Weight: 2248 oz

## 2017-05-13 LAB — IRON: Iron: 19 ug/dL — ABNORMAL LOW (ref 45–182)

## 2017-05-13 LAB — TROPONIN I
TROPONIN I: 0.07 ng/mL — AB (ref <0.03)
Troponin I: 0.08 ng/mL (ref <0.03)

## 2017-05-13 LAB — GLUCOSE, CAPILLARY: Glucose-Capillary: 100 mg/dL — ABNORMAL HIGH (ref 65–99)

## 2017-05-13 MED ORDER — FUROSEMIDE 10 MG/ML IJ SOLN
40.0000 mg | Freq: Four times a day (QID) | INTRAMUSCULAR | Status: DC
  Administered 2017-05-13 – 2017-05-14 (×5): 40 mg via INTRAVENOUS
  Filled 2017-05-13 (×5): qty 4

## 2017-05-13 MED ORDER — ROPINIROLE HCL 1 MG PO TABS
2.0000 mg | ORAL_TABLET | Freq: Three times a day (TID) | ORAL | Status: DC
  Administered 2017-05-13: 2 mg via ORAL
  Filled 2017-05-13: qty 2

## 2017-05-13 NOTE — Consult Note (Signed)
[facility] Cardiology Consultation Note  Patient ID: Kelly Leonard, MRN: [id number], DOB/AGE: 11-02-1955 61 y.o. Admit date: 05/12/2017   Date of Consult: 05/13/2017 Primary Physician: Gabriel Cirri, NP Primary Cardiologist: Paraschos  Chief Complaint:  Chief Complaint  Patient presents with  . Shortness of Breath   Reason for Consult: acute on chronic systolic dysfunction heart failure  HPI: 61 y.o. male with the known severe dilated cardiomyopathy with global dysfunction and ejection fraction of 25% and normal coronary arteries by cardiac catheterization in the past essential hypertension makes hyperlipidemia and chronic kidney disease stage IV with a previous ICD placement who has had recent exacerbation of shortness of breath PND and orthopnea over the last several days consistent with acute on chronic systolic dysfunction heart failure. The patient had a BNP of 4500 and troponin is 0.08 consistent with demand ischemia. Patient has had difficulty with furosemide and torsemide due to his chronic kidney disease and unable to relieve his symptoms although with intravenous Lasix he has had significant improvements. The patient has not been able to tolerate other medication management at this time due to hypofunction and hypotension. Currently the patient is hemodynamically stable at this time and resting better  Past Medical History:  Diagnosis Date  . Anxiety   . CHF (congestive heart failure) (HCC)   . Chronic systolic congestive heart failure (HCC)   . CKD (chronic kidney disease) stage 4, GFR 15-29 ml/min (HCC)   . COPD (chronic obstructive pulmonary disease) (HCC)   . Dilated cardiomyopathy (HCC)   . Hyperlipidemia   . Hypertension   . MI (myocardial infarction) (HCC)   . Polycystic kidney   . Renal disorder       Surgical History:  Past Surgical History:  Procedure Laterality Date  . CARDIAC CATHETERIZATION    . IMPLANTABLE CARDIOVERTER DEFIBRILLATOR (ICD) GENERATOR  CHANGE Right 06/23/2015   Procedure: ICD GENERATOR  INITIAL IMPLANT;  Surgeon: Sharion Settler, MD;  Location: [facility] ORS;  Service: Cardiovascular;  Laterality: Right;     Home Meds: Prior to Admission medications   Medication Sig Start Date End Date Taking? Authorizing Provider  albuterol (PROVENTIL HFA;VENTOLIN HFA) 108 (90 Base) MCG/ACT inhaler Inhale 2 puffs into the lungs every 6 (six) hours as needed for wheezing or shortness of breath. 03/17/17  Yes Particia Nearing, PA-C  aspirin EC 81 MG tablet Take 81 mg by mouth daily.   Yes [provider]  furosemide (LASIX) 40 MG tablet Take 60 mg by mouth daily.   Yes [provider]  sertraline (ZOLOFT) 25 MG tablet Take 1 tablet (25 mg total) by mouth daily. 03/24/17  Yes Gabriel Cirri, NP  triamcinolone cream (KENALOG) 0.1 % Apply 1 application topically 2 (two) times daily. Patient not taking: Reported on 05/12/2017 05/08/17   Particia Nearing, PA-C  valsartan (DIOVAN) 160 MG tablet Take 1 tablet (160 mg total) by mouth daily. Patient not taking: Reported on 05/12/2017 03/24/17   Gabriel Cirri, NP    Inpatient Medications:  . aspirin EC  81 mg Oral Daily  . docusate sodium  100 mg Oral BID  . enoxaparin (LOVENOX) injection  30 mg Subcutaneous Q24H  . furosemide  40 mg Intravenous Q6H  . pneumococcal 23 valent vaccine  0.5 mL Intramuscular Tomorrow-1000     Allergies:  Allergies  Allergen Reactions  . Penicillins Anaphylaxis and Swelling    Has patient had a PCN reaction causing immediate rash, facial/tongue/throat swelling, SOB or lightheadedness with hypotension: Yes Has patient  had a PCN reaction causing severe rash involving mucus membranes or skin necrosis: No Has patient had a PCN reaction that required hospitalization: No Has patient had a PCN reaction occurring within the last 10 years: No If all of the above answers are "NO", then may proceed with Cephalosporin use.   . Ace Inhibitors Rash  .  Carvedilol Other (See Comments)    "dizziness, light headed, and nausea"  . Torsemide Hives, Itching, Nausea And Vomiting and Rash    Social History   Social History  . Marital status: Single    Spouse name: N/A  . Number of children: N/A  . Years of education: N/A   Occupational History  . Not on file.   Social History Main Topics  . Smoking status: Never Smoker  . Smokeless tobacco: Never Used  . Alcohol use No     Comment: on occasion  . Drug use: No  . Sexual activity: Yes   Other Topics Concern  . Not on file   Social History Narrative  . No narrative on file     Family History  Problem Relation Age of Onset  . Diabetes Mother   . Hypertension Brother   . Heart disease Maternal Grandmother      Review of Systems Positive for Shortness of breath and orthopnea Negative for: General:  chills, fever, night sweats or weight changes.  Cardiovascular: Positive for PND orthopnea negative for syncope dizziness  Dermatological skin lesions rashes Respiratory: Cough congestion Urologic: Frequent urination urination at night and hematuria Abdominal: negative for nausea, vomiting, diarrhea, bright red blood per rectum, melena, or hematemesis Neurologic: negative for visual changes, and/or hearing changes  All other systems reviewed and are otherwise negative except as noted above.  Labs:  Recent Labs  05/12/17 1525 05/13/17 0011 05/13/17 0609  TROPONINI 0.07* 0.07* 0.08*   Lab Results  Component Value Date   WBC 8.2 05/13/2017   HGB 12.0 (L) 05/13/2017   HCT 36.7 (L) 05/13/2017   MCV 79.0 (L) 05/13/2017   PLT 375 05/13/2017    Recent Labs Lab 05/13/17 0609  NA 135  K 4.6  CL 98*  CO2 22  BUN 93*  CREATININE 4.13*  CALCIUM 9.4  GLUCOSE 102*   Lab Results  Component Value Date   CHOL 201 (H) 02/12/2017   HDL 63 02/12/2017   LDLCALC 120 (H) 02/12/2017   TRIG 88 02/12/2017   No results found for: DDIMER  Radiology/Studies:  Dg Chest 2  View  Result Date: 05/12/2017 CLINICAL DATA:  Dyspnea EXAM: CHEST  2 VIEW COMPARISON:  05/02/2017 chest radiograph. FINDINGS: Stable configuration of single lead left subclavian ICD. Stable cardiomediastinal silhouette with mild cardiomegaly. No pneumothorax. No pleural effusion. Lungs appear clear, with no acute consolidative airspace disease and no pulmonary edema. IMPRESSION: Stable mild cardiomegaly without overt pulmonary edema. No active pulmonary disease. Electronically Signed   By: Delbert Phenix M.D.   On: 05/12/2017 15:55   Dg Chest Port 1 View  Result Date: 05/02/2017 CLINICAL DATA:  Shortness of breath.  Productive cough. EXAM: PORTABLE CHEST 1 VIEW COMPARISON:  03/15/2017 FINDINGS: A single lead ICD remains in place. The cardiac silhouette remains mildly enlarged. Minimal streaky opacity in the left lung base and in the right infrahilar region is similar to the prior study. No lobar consolidation, edema, sizable pleural effusion, or pneumothorax is identified. No acute osseous abnormality is seen. IMPRESSION: Minimal bibasilar opacity, likely atelectasis. Electronically Signed   By: Jolaine Click.D.  On: 05/02/2017 02:24   [employer]  Result Date: 05/13/2017 CLINICAL DATA:  Acute renal failure EXAM: RENAL / URINARY TRACT ULTRASOUND COMPLETE COMPARISON:  None. FINDINGS: Right Kidney: Length: Enlarged, 16.4 cm. Innumerable cysts throughout the right kidney compatible with polycystic kidney disease, the largest 3.7 cm in the upper pole. Increased echotexture throughout the renal parenchyma. Echogenicity within normal limits. No hydronephrosis. Left Kidney: Length: None large, approximately 15 cm. No hydronephrosis. Innumerable cysts, the largest 3.2 cm. Increased echotexture throughout the renal parenchyma. Bladder: Appears normal for degree of bladder distention. IMPRESSION: Innumerable bilateral renal cysts. Kidneys are enlarged. Findings most compatible with polycystic kidney  disease. No hydronephrosis. Increased echotexture throughout the kidneys. Electronically Signed   By: Charlett Nose M.D.   On: 05/13/2017 12:19    EKG: Normal sinus rhythm with left atrial enlargement left axis deviation and poor R-wave progression  Weights: Filed Weights   05/12/17 1522 05/12/17 1905 05/13/17 0500  Weight: 64.9 kg (143 lb) 64 kg (141 lb 3.2 oz) 63.7 kg (140 lb 8 oz)     Physical Exam: Blood pressure 129/68, pulse 79, temperature 97.8 F (36.6 C), temperature source Oral, resp. rate 16, height 5\' 9"  (1.753 m), weight 63.7 kg (140 lb 8 oz), SpO2 97 %. Body mass index is 20.75 kg/m. General: Well developed, well nourished, in no acute distress. Head eyes ears nose throat: Normocephalic, atraumatic, sclera non-icteric, no xanthomas, nares are without discharge. No apparent thyromegaly and/or mass  Lungs: Normal respiratory effort.  no wheezes, Basilar rales, no rhonchi.  Heart: RRR with normal S1 S2. no murmur gallop, no rub, PMI is normal size and placement, carotid upstroke normal without bruit, jugular venous pressure is normal Abdomen: Soft, non-tender, non-distended with normoactive bowel sounds. No hepatomegaly. No rebound/guarding. No obvious abdominal masses. Abdominal aorta is normal size without bruit Extremities: Trace edema. no cyanosis, no clubbing, no ulcers  Peripheral : 2+ bilateral upper extremity pulses, 2+ bilateral femoral pulses, 2+ bilateral dorsal pedal pulse Neuro: Alert and oriented. No facial asymmetry. No focal deficit. Moves all extremities spontaneously. Musculoskeletal: Normal muscle tone without kyphosis Psych:  Responds to questions appropriately with a normal affect.    Assessment: 61 year old male with acute on chronic systolic dysfunction congestive heart failure multifactorial in nature including chronic kidney disease stage IV with an elevated troponin consistent with demand ischemia rather than acute coronary syndrome currently slightly  improved  Plan: 1. Continue supportive care heart failure including oxygen and low-sodium diet 2. Intravenous Lasix watching closely for worsening chronic kidney disease 3. No further cardiac diagnostics necessary at this time with no current evidence of myocardial infarction and recent echocardiogram 4. Continue defibrillator evaluation in the future without current evidence of rhythm disturbances 5. Begin ambulation and follow for improvements with possible discharged home if able to reinstate medication management for edema  Signed, Lamar Blinks M.D. [facility] Cardiology 05/13/2017, 12:46 PM

## 2017-05-13 NOTE — Care Management (Signed)
Patient admitted from home with congestive heart failure.  This is a chronic diagnosis for patient.  Independent in all adls, denies issues accessing medical care, obtaining medications or with transportation.  He has access to scales.  An appointment is present with the Heart Failure Clinic post discharge.  Nephrology consult in progress due to rising creatinine in patient with  stage 4 chronic kidney disease .

## 2017-05-13 NOTE — Progress Notes (Addendum)
[facility] - [place] at [facility]   PATIENT NAME: Kelly Leonard    MR#:  [id number]  DATE OF BIRTH:  1955/10/30  SUBJECTIVE: Patient admitted for acute on chronic systolic heart failure with elevated BNP, shortness of breath worse despite increasing his dose of Lasix as an outpatient. Shortness of breath improved, patient had restless legs, agitation so he received Requip and Ativan. Patient has been having jerking now nad I feel he is having side effect with combination of Requip and Ativan.. Patient has no leg edema. No hypoxia. Worsening kidney function.   CHIEF COMPLAINT:   Chief Complaint  Patient presents with  . Shortness of Breath    REVIEW OF SYSTEMS:    Review of Systems  Constitutional: Negative for chills and fever.  HENT: Negative for hearing loss.   Eyes: Negative for blurred vision, double vision and photophobia.  Respiratory: Negative for cough, hemoptysis, sputum production and shortness of breath.   Cardiovascular: Negative for palpitations, orthopnea, leg swelling and PND.  Gastrointestinal: Negative for abdominal pain, diarrhea and vomiting.  Genitourinary: Negative for dysuria and urgency.  Musculoskeletal: Negative for myalgias and neck pain.  Skin: Negative for rash.  Neurological: Negative for dizziness, focal weakness, seizures, weakness and headaches.  Psychiatric/Behavioral: Negative for memory loss. The patient is nervous/anxious. The patient does not have insomnia.     Nutrition:  Tolerating Diet: Tolerating PT:      DRUG ALLERGIES:   Allergies  Allergen Reactions  . Penicillins Anaphylaxis and Swelling    Has patient had a PCN reaction causing immediate rash, facial/tongue/throat swelling, SOB or lightheadedness with hypotension: Yes Has patient had a PCN reaction causing severe rash involving mucus membranes or skin necrosis: No Has patient had a PCN reaction that required hospitalization: No Has patient had a  PCN reaction occurring within the last 10 years: No If all of the above answers are "NO", then may proceed with Cephalosporin use.   . Ace Inhibitors Rash  . Carvedilol Other (See Comments)    "dizziness, light headed, and nausea"  . Torsemide Hives, Itching, Nausea And Vomiting and Rash    VITALS:  Blood pressure (!) 163/98, pulse 99, temperature 97.8 F (36.6 C), temperature source Oral, resp. rate 20, height 5\' 9"  (1.753 m), weight 63.7 kg (140 lb 8 oz), SpO2 98 %.  PHYSICAL EXAMINATION:   Physical Exam  GENERAL:  61 y.o.-year-old patient lying in the bed with no acute distress.  EYES: Pupils equal, round, reactive to light and accommodation. No scleral icterus. Extraocular muscles intact.  HEENT: Head atraumatic, normocephalic. Oropharynx and nasopharynx clear.  NECK:  Supple, no jugular venous distention. No thyroid enlargement, no tenderness.  LUNGS: Normal breath sounds bilaterally, no wheezing, rales,rhonchi or crepitation. No use of accessory muscles of respiration.  CARDIOVASCULAR: S1, S2 normal. No murmurs, rubs, or gallops.  ABDOMEN: Soft, nontender, nondistended. Bowel sounds present. No organomegaly or mass.  EXTREMITIES: No pedal edema, cyanosis, or clubbing.  NEUROLOGIC: Cranial nerves II through XII are intact. Muscle strength 5/5 in all extremities. Sensation intact. Gait not checked.  PSYCHIATRIC: The patient is alert and oriented x 3. He appears to be anxious and he told me that he was fine yesterday and everything started today.  SKIN: No obvious rash, lesion, or ulcer.    LABORATORY PANEL:   CBC  Recent Labs Lab 05/13/17 0609  WBC 8.2  HGB 12.0*  HCT 36.7*  PLT 375   ------------------------------------------------------------------------------------------------------------------  Chemistries   Recent Labs Lab  05/13/17 0609  NA 135  K 4.6  CL 98*  CO2 22  GLUCOSE 102*  BUN 93*  CREATININE 4.13*  CALCIUM 9.4    ------------------------------------------------------------------------------------------------------------------  Cardiac Enzymes  Recent Labs Lab 05/13/17 0609  TROPONINI 0.08*   ------------------------------------------------------------------------------------------------------------------  RADIOLOGY:  Dg Chest 2 View  Result Date: 05/12/2017 CLINICAL DATA:  Dyspnea EXAM: CHEST  2 VIEW COMPARISON:  05/02/2017 chest radiograph. FINDINGS: Stable configuration of single lead left subclavian ICD. Stable cardiomediastinal silhouette with mild cardiomegaly. No pneumothorax. No pleural effusion. Lungs appear clear, with no acute consolidative airspace disease and no pulmonary edema. IMPRESSION: Stable mild cardiomegaly without overt pulmonary edema. No active pulmonary disease. Electronically Signed   By: Delbert Phenix M.D.   On: 05/12/2017 15:55     ASSESSMENT AND PLAN:   Active Problems:   Acute CHF (congestive heart failure) (HCC)  #1 acute on chronic systolic heart failure: With EF 25% by echo in 2016. The patient has dilated cardiomyopathy, history of ICD placement: Continue IV furosemide but decrease dose because off worsening kidney function. Patient doesn't appear to be in CHF today, no leg edema, no hypoxia. Continue to check daily weights, and cardiology consult for further treatment of CHF.h/ointolerance to beta blockers because of dizziness, nausea.  #2 acute on chronic renal failure with chronic kidney disease stage III: Creatinine increased from 3.78.to 4.13 today. Consult nephrology. #3. Restless leg syndrome, anxiety. Side effect with Requip and Ativan combination so I discontinued them. Patient has episodic jerks likely due to worsening kidney disease also. D/w significant other at bedside. D/w pt's nurse   All the records are reviewed and case discussed with Care Management/Social Workerr. Management plans discussed with the patient, family and they are in  agreement.  CODE STATUS: full  TOTAL TIME TAKING CARE OF THIS PATIENT: 35 minutes.   POSSIBLE D/C IN 1-2 DAYS, DEPENDING ON CLINICAL CONDITION.   Katha Hamming M.D on 05/13/2017 at 10:10 AM  Between 7am to 6pm - Pager - [phone]  After 6pm go to [email or web address] - password [email or web address] [facility]  [facility]  Office  [phone]  CC: Primary care physician; Gabriel Cirri, NP

## 2017-05-13 NOTE — Progress Notes (Signed)
Patient complaining of anxiety and restless legs. Patient has no prior history of restless legs. Notified MD Pyreddy and MD to place orders. Nursing staff will continue to monitor for any changes in patient status. Lamonte Richer, RN

## 2017-05-13 NOTE — Discharge Instructions (Signed)
Heart Failure Clinic appointment on May 22 2017 at 10:20am with [name], FNP. Please call [phone] to reschedule.

## 2017-05-13 NOTE — Progress Notes (Signed)
[facility]  ROUNDING NOTE   Subjective:   Wife at bedside.   [name] admitted to [facility] on 05/12/2017 for SOB (shortness of breath) [R06.02] Acute on chronic congestive heart failure, unspecified heart failure type (HCC) [I50.9]   Patient was unable to take torsemide which gave urticaria. Then found to have SOB, DOE and BNP >4500.   Creatinine elevated at 4.13 from baseline of 2.92 GFR of 25 on 03/15/17.   Lethargic this morning, unable to follow commands or answer questions.   Objective:  Vital signs in last 24 hours:  Temp:  [97.5 F (36.4 C)-97.8 F (36.6 C)] 97.8 F (36.6 C) (09/04 0731) Pulse Rate:  [79-107] 79 (09/04 1221) Resp:  [13-20] 16 (09/04 1221) BP: (121-163)/(68-113) 129/68 (09/04 1221) SpO2:  [93 %-100 %] 97 % (09/04 1221) Weight:  [63.7 kg (140 lb 8 oz)-64.9 kg (143 lb)] 63.7 kg (140 lb 8 oz) (09/04 0500)  Weight change:  Filed Weights   05/12/17 1522 05/12/17 1905 05/13/17 0500  Weight: 64.9 kg (143 lb) 64 kg (141 lb 3.2 oz) 63.7 kg (140 lb 8 oz)    Intake/Output: I/O last 3 completed shifts: In: -  Out: 450 [Urine:450]   Intake/Output this shift:  Total I/O In: 240 [P.O.:240] Out: -   Physical Exam: General: NAD, laying in bed  Head: Normocephalic, atraumatic. Moist oral mucosal membranes  Eyes: Anicteric, PERRL  Neck: Supple, trachea midline  Lungs:  Clear to auscultation  Heart: Regular rate and rhythm  Abdomen:  Soft, nontender,   Extremities: no peripheral edema.  Neurologic: Unable to follow commands  Skin: No lesions       Basic Metabolic Panel:  Recent Labs Lab 05/12/17 1525 05/13/17 0609  NA 133* 135  K 5.2* 4.6  CL 97* 98*  CO2 23 22  GLUCOSE 100* 102*  BUN 85* 93*  CREATININE 3.78* 4.13*  CALCIUM 9.1 9.4    Liver Function Tests: No results for input(s): AST, ALT, ALKPHOS, BILITOT, PROT, ALBUMIN in the last 168 hours. No results for input(s): LIPASE, AMYLASE in the last 168 hours. No results  for input(s): AMMONIA in the last 168 hours.  CBC:  Recent Labs Lab 05/12/17 1525 05/13/17 0609  WBC 8.1 8.2  HGB 12.1* 12.0*  HCT 36.5* 36.7*  MCV 78.6* 79.0*  PLT 376 375    Cardiac Enzymes:  Recent Labs Lab 05/12/17 1525 05/13/17 0011 05/13/17 0609  TROPONINI 0.07* 0.07* 0.08*    BNP: Invalid input(s): POCBNP  CBG:  Recent Labs Lab 05/13/17 0807  GLUCAP 100*    Microbiology: Results for orders placed or performed during the hospital encounter of 06/12/15  Surgical pcr screen     Status: None   Collection Time: 06/12/15  9:06 AM  Result Value Ref Range Status   MRSA, PCR NEGATIVE NEGATIVE Final   Staphylococcus aureus NEGATIVE NEGATIVE Final    Comment:        The Xpert SA Assay (FDA approved for NASAL specimens in patients over 40 years of age), is one component of a comprehensive surveillance program.  Test performance has been validated by [facility] for patients greater than or equal to 69 year old. It is not intended to diagnose infection nor to guide or monitor treatment.     Coagulation Studies: No results for input(s): LABPROT, INR in the last 72 hours.  Urinalysis: No results for input(s): COLORURINE, LABSPEC, PHURINE, GLUCOSEU, HGBUR, BILIRUBINUR, KETONESUR, PROTEINUR, UROBILINOGEN, NITRITE, LEUKOCYTESUR in the last 72 hours.  Invalid input(s): APPERANCEUR    Imaging: Dg Chest 2 View  Result Date: 05/12/2017 CLINICAL DATA:  Dyspnea EXAM: CHEST  2 VIEW COMPARISON:  05/02/2017 chest radiograph. FINDINGS: Stable configuration of single lead left subclavian ICD. Stable cardiomediastinal silhouette with mild cardiomegaly. No pneumothorax. No pleural effusion. Lungs appear clear, with no acute consolidative airspace disease and no pulmonary edema. IMPRESSION: Stable mild cardiomegaly without overt pulmonary edema. No active pulmonary disease. Electronically Signed   By: Delbert Phenix M.D.   On: 05/12/2017 15:55   [employer]  Result Date: 05/13/2017 CLINICAL DATA:  Acute renal failure EXAM: RENAL / URINARY TRACT ULTRASOUND COMPLETE COMPARISON:  None. FINDINGS: Right Kidney: Length: Enlarged, 16.4 cm. Innumerable cysts throughout the right kidney compatible with polycystic kidney disease, the largest 3.7 cm in the upper pole. Increased echotexture throughout the renal parenchyma. Echogenicity within normal limits. No hydronephrosis. Left Kidney: Length: None large, approximately 15 cm. No hydronephrosis. Innumerable cysts, the largest 3.2 cm. Increased echotexture throughout the renal parenchyma. Bladder: Appears normal for degree of bladder distention. IMPRESSION: Innumerable bilateral renal cysts. Kidneys are enlarged. Findings most compatible with polycystic kidney disease. No hydronephrosis. Increased echotexture throughout the kidneys. Electronically Signed   By: Charlett Nose M.D.   On: 05/13/2017 12:19     Medications:    . aspirin EC  81 mg Oral Daily  . docusate sodium  100 mg Oral BID  . enoxaparin (LOVENOX) injection  30 mg Subcutaneous Q24H  . furosemide  40 mg Intravenous Q6H  . pneumococcal 23 valent vaccine  0.5 mL Intramuscular Tomorrow-1000   acetaminophen **OR** acetaminophen, albuterol, bisacodyl, HYDROcodone-acetaminophen, ondansetron **OR** ondansetron (ZOFRAN) IV  Assessment/ Plan:  [name] with chronic systolic congestive heart failure, ICD placement, hypertension, hepatitis C, polycystic kidney disease with chronic kidney disease stage IV. Admitted on 05/12/2017 for SOB (shortness of breath) [R06.02] Acute on chronic congestive heart failure, unspecified heart failure type (HCC) [I50.9]   1. Acute kidney injury on Chronic kidney disease stage IV: with bland urine. Baseline creatinine 2.92 GFR of 25 03/15/17.  Chronic kidney disease secondary to polycystic kidney disease and hypertension.  - Acute renal failure secondary to acute cardio-renal syndrome  -  Agree with IV furosemide - Monitor volume status, urine output, renal function. No acute indication for dialysis at this time - holding valsartan.   2. Hypertension: elevated. History of white coat hypertension. Echocardiogram pending - Acute exacerbation of congestive heart failure - Continue furosemide (allergic to torsemide).  - holding valsartan  3. Hyponatremia: secondary to CHF. Not related to patient's sertraline which he was only taking as needed.    LOS: 1 Kelly Leonard 9/4/20181:04 PM

## 2017-05-13 NOTE — Progress Notes (Signed)
*  PRELIMINARY RESULTS* Echocardiogram 2D Echocardiogram has been performed.  Kelly Leonard 05/13/2017, 3:18 PM

## 2017-05-14 ENCOUNTER — Encounter: Payer: Self-pay | Admitting: Gastroenterology

## 2017-05-14 ENCOUNTER — Ambulatory Visit (INDEPENDENT_AMBULATORY_CARE_PROVIDER_SITE_OTHER): Payer: BLUE CROSS/BLUE SHIELD | Admitting: Gastroenterology

## 2017-05-14 ENCOUNTER — Other Ambulatory Visit
Admission: RE | Admit: 2017-05-14 | Discharge: 2017-05-14 | Disposition: A | Discharge status: Home / Self Care | Payer: BLUE CROSS/BLUE SHIELD | Source: Ambulatory Visit | Attending: Gastroenterology | Admitting: Gastroenterology

## 2017-05-14 VITALS — BP 138/82 | HR 84 | Temp 98.0°F | Ht 69.0 in | Wt 134.4 lb

## 2017-05-14 DIAGNOSIS — N184 Chronic kidney disease, stage 4 (severe): Secondary | ICD-10-CM

## 2017-05-14 DIAGNOSIS — B1920 Unspecified viral hepatitis C without hepatic coma: Secondary | ICD-10-CM | POA: Insufficient documentation

## 2017-05-14 LAB — BASIC METABOLIC PANEL
ANION GAP: 12 (ref 5–15)
BUN: 90 mg/dL — ABNORMAL HIGH (ref 6–20)
CHLORIDE: 101 mmol/L (ref 101–111)
CO2: 26 mmol/L (ref 22–32)
Calcium: 9.1 mg/dL (ref 8.9–10.3)
Creatinine, Ser: 3.48 mg/dL — ABNORMAL HIGH (ref 0.61–1.24)
GFR calc non Af Amer: 18 mL/min — ABNORMAL LOW (ref >60)
GFR, EST AFRICAN AMERICAN: 20 mL/min — AB (ref >60)
Glucose, Bld: 84 mg/dL (ref 65–99)
POTASSIUM: 3.8 mmol/L (ref 3.5–5.1)
SODIUM: 139 mmol/L (ref 135–145)

## 2017-05-14 LAB — HEPATIC FUNCTION PANEL
ALBUMIN: 3.6 g/dL (ref 3.5–5.0)
ALK PHOS: 67 U/L (ref 38–126)
ALT: 89 U/L — ABNORMAL HIGH (ref 17–63)
AST: 106 U/L — ABNORMAL HIGH (ref 15–41)
Bilirubin, Direct: 0.3 mg/dL (ref 0.1–0.5)
Indirect Bilirubin: 0.9 mg/dL (ref 0.3–0.9)
Total Bilirubin: 1.2 mg/dL (ref 0.3–1.2)
Total Protein: 7.4 g/dL (ref 6.5–8.1)

## 2017-05-14 LAB — GLUCOSE, CAPILLARY: GLUCOSE-CAPILLARY: 129 mg/dL — AB (ref 65–99)

## 2017-05-14 LAB — PROTIME-INR
INR: 1.15
PROTHROMBIN TIME: 14.6 s (ref 11.4–15.2)

## 2017-05-14 MED ORDER — VALSARTAN 40 MG PO TABS: 40.0000 mg | ORAL_TABLET | Freq: Every day | ORAL | 11 refills | Status: DC

## 2017-05-14 MED ORDER — FUROSEMIDE 40 MG PO TABS: 40.0000 mg | ORAL_TABLET | Freq: Three times a day (TID) | ORAL | 11 refills | Status: DC

## 2017-05-14 NOTE — Progress Notes (Signed)
Patient discharged as ordered,instructions explained and well understood,vital signs within normal limits,follow up appointments made,escorted by staff member via wheel chair

## 2017-05-14 NOTE — Addendum Note (Signed)
Addended by: Ardyth Man on: 05/14/2017 04:23 PM   Modules accepted: Orders

## 2017-05-14 NOTE — Addendum Note (Signed)
Addended by: Ardyth Man on: 05/14/2017 04:02 PM   Modules accepted: Orders

## 2017-05-14 NOTE — Progress Notes (Signed)
[facility] Cardiology [facility] Encounter Note  Patient: Kelly Leonard / Admit Date: 05/12/2017 / Date of Encounter: 05/14/2017, 8:48 AM   Subjective: Significant improvements in symptoms since admission. No evidence of significant pulmonary edema by exam. Patient is up and moving around much better today with less shortness of breath. Patient still has significant chronic kidney disease likely exacerbating his acute on chronic systolic dysfunction heart failure it's of myocardial infarction  Review of Systems: Positive for: Shortness of breath weakness fatigue Negative for: Vision change, hearing change, syncope, dizziness, nausea, vomiting,diarrhea, bloody stool, stomach pain, cough, congestion, diaphoresis, urinary frequency, urinary pain,skin lesions, skin rashes Others previously listed  Objective: Telemetry: Normal sinus rhythm Physical Exam: Blood pressure (!) 144/83, pulse 70, temperature 97.9 F (36.6 C), temperature source Oral, resp. rate 16, height 5\' 9"  (1.753 m), weight 58 kg (127 lb 14.4 oz), SpO2 97 %. Body mass index is 18.89 kg/m. General: Well developed, well nourished, in no acute distress. Head: Normocephalic, atraumatic, sclera non-icteric, no xanthomas, nares are without discharge. Neck: No apparent masses Lungs: Normal respirations with no wheezes, no rhonchi, no rales , no crackles   Heart: Regular rate and rhythm, normal S1 S2, no murmur, no rub, no gallop, PMI is normal size and placement, carotid upstroke normal without bruit, jugular venous pressure normal Abdomen: Soft, non-tender, non-distended with normoactive bowel sounds. No hepatosplenomegaly. Abdominal aorta is normal size without bruit Extremities: No edema, no clubbing, no cyanosis, no ulcers,  Peripheral: 2+ radial, 2+ femoral, 2+ dorsal pedal pulses Neuro: Alert and oriented. Moves all extremities spontaneously. Psych:  Responds to questions appropriately with a normal affect.   Intake/Output  Summary (Last 24 hours) at 05/14/17 0848 Last data filed at 05/14/17 0747  Gross per 24 hour  Intake              720 ml  Output             4775 ml  Net            -4055 ml    Inpatient Medications:  . aspirin EC  81 mg Oral Daily  . docusate sodium  100 mg Oral BID  . enoxaparin (LOVENOX) injection  30 mg Subcutaneous Q24H  . furosemide  40 mg Intravenous Q6H   Infusions:   Labs:  Recent Labs  05/13/17 0609 05/14/17 0515  NA 135 139  K 4.6 3.8  CL 98* 101  CO2 22 26  GLUCOSE 102* 84  BUN 93* 90*  CREATININE 4.13* 3.48*  CALCIUM 9.4 9.1   No results for input(s): AST, ALT, ALKPHOS, BILITOT, PROT, ALBUMIN in the last 72 hours.  Recent Labs  05/12/17 1525 05/13/17 0609  WBC 8.1 8.2  HGB 12.1* 12.0*  HCT 36.5* 36.7*  MCV 78.6* 79.0*  PLT 376 375    Recent Labs  05/12/17 1525 05/13/17 0011 05/13/17 0609  TROPONINI 0.07* 0.07* 0.08*   Invalid input(s): POCBNP No results for input(s): HGBA1C in the last 72 hours.   Weights: Filed Weights   05/13/17 0500 05/14/17 0500 05/14/17 0746  Weight: 63.7 kg (140 lb 8 oz) 58 kg (127 lb 12.8 oz) 58 kg (127 lb 14.4 oz)     Radiology/Studies:  Dg Chest 2 View  Result Date: 05/12/2017 CLINICAL DATA:  Dyspnea EXAM: CHEST  2 VIEW COMPARISON:  05/02/2017 chest radiograph. FINDINGS: Stable configuration of single lead left subclavian ICD. Stable cardiomediastinal silhouette with mild cardiomegaly. No pneumothorax. No pleural effusion. Lungs appear clear, with no acute  consolidative airspace disease and no pulmonary edema. IMPRESSION: Stable mild cardiomegaly without overt pulmonary edema. No active pulmonary disease. Electronically Signed   By: Delbert Phenix M.D.   On: 05/12/2017 15:55   Dg Chest Port 1 View  Result Date: 05/02/2017 CLINICAL DATA:  Shortness of breath.  Productive cough. EXAM: PORTABLE CHEST 1 VIEW COMPARISON:  03/15/2017 FINDINGS: A single lead ICD remains in place. The cardiac silhouette remains mildly  enlarged. Minimal streaky opacity in the left lung base and in the right infrahilar region is similar to the prior study. No lobar consolidation, edema, sizable pleural effusion, or pneumothorax is identified. No acute osseous abnormality is seen. IMPRESSION: Minimal bibasilar opacity, likely atelectasis. Electronically Signed   By: Sebastian Ache M.D.   On: 05/02/2017 02:24   [employer] [employer]  Result Date: 05/13/2017 CLINICAL DATA:  Acute renal failure EXAM: RENAL / URINARY TRACT ULTRASOUND COMPLETE COMPARISON:  None. FINDINGS: Right Kidney: Length: Enlarged, 16.4 cm. Innumerable cysts throughout the right kidney compatible with polycystic kidney disease, the largest 3.7 cm in the upper pole. Increased echotexture throughout the renal parenchyma. Echogenicity within normal limits. No hydronephrosis. Left Kidney: Length: None large, approximately 15 cm. No hydronephrosis. Innumerable cysts, the largest 3.2 cm. Increased echotexture throughout the renal parenchyma. Bladder: Appears normal for degree of bladder distention. IMPRESSION: Innumerable bilateral renal cysts. Kidneys are enlarged. Findings most compatible with polycystic kidney disease. No hydronephrosis. Increased echotexture throughout the kidneys. Electronically Signed   By: Charlett Nose M.D.   On: 05/13/2017 12:19     Assessment and Recommendation  61 y.o. male with known acute on chronic systolic dysfunction congestive heart failure essential hypertension makes hyperlipidemia with elevated troponin consistent with demand ischemia rather than acute coronary syndrome having improvements with intravenous Lasix despite severe chronic kidney disease. 1. Continue furosemide injection for further risk reduction in pulmonary edema and congestive heart failure and change over to oral Lasix if able 2. Further treatment and diagnostic testing as per nephrology for chronic kidney disease stage IV 3. No further intervention from the cardiac standpoint  and/or diagnostics due to no evidence of myocardial infarction with no evidence of ICD firing line 4. Ambulation and follow for improvements and possible discharged home with adjustments of medication as an outpatient  Signed, Arnoldo Hooker M.D. FACC

## 2017-05-14 NOTE — Progress Notes (Signed)
Wyline Mood MD, MRCP(U.K) [address]  [address]  [place], [place] [address]  Main: [phone]  Fax: [phone]   Primary Care Physician: Gabriel Cirri, NP  Primary Gastroenterologist:  Dr. Wyline Mood   Chief Complaint  Patient presents with  . Hospitalization Follow-up    HPI: Kelly Leonard is a 61 y.o. male   Summary of history :  He was seen initially on 03/26/17 for hepatitis C antibody positive. HIV negative. Creatinine 2.92.Never known to have had hepatitis C and  Was checked as a routine health measure. No tatoos, no blood transfusion. Sniffed cocaine 30 years back . Did not consume alcohol in excess, never smoked, did not serve in the forces.    Interval history   03/26/2017-  05/14/2017   Labs 03/2017- Hep A ab ,HbsAg,Hep BeAb,Hep BeAg  - negative Hep B c ab -positive ,  Hep C Gt 1b ,VL 1.6 million  HbsAb-negative.  Cr 3.35 ,GFR 20   Was admitted to the hospital and discharged today for shortness of breath felt to be secondary to chronic systolic heart dysfuntion , treated with lasix         Current Outpatient Prescriptions  Medication Sig Dispense Refill  . albuterol (PROVENTIL HFA;VENTOLIN HFA) 108 (90 Base) MCG/ACT inhaler Inhale 2 puffs into the lungs every 6 (six) hours as needed for wheezing or shortness of breath. 1 Inhaler 3  . aspirin EC 81 MG tablet Take 81 mg by mouth daily.    . furosemide (LASIX) 40 MG tablet Take 1 tablet (40 mg total) by mouth 3 (three) times daily. 60 tablet 11  . sertraline (ZOLOFT) 25 MG tablet Take 1 tablet (25 mg total) by mouth daily. 90 tablet 1  . triamcinolone cream (KENALOG) 0.1 %   0  . valsartan (DIOVAN) 40 MG tablet Take 1 tablet (40 mg total) by mouth daily. 30 tablet 11   No current facility-administered medications for this visit.     Allergies as of 05/14/2017 - Review Complete 05/14/2017  Allergen Reaction Noted  . Penicillins Anaphylaxis and Swelling 03/01/2014  . Ace inhibitors Rash  12/07/2014  . Carvedilol Other (See Comments) 03/24/2017  . Torsemide Hives, Itching, Nausea And Vomiting, and Rash 05/08/2017    ROS:  General: Negative for anorexia, weight loss, fever, chills, fatigue, weakness. ENT: Negative for hoarseness, difficulty swallowing , nasal congestion. CV: Negative for chest pain, angina, palpitations, dyspnea on exertion, peripheral edema.  Respiratory: Negative for dyspnea at rest, dyspnea on exertion, cough, sputum, wheezing.  GI: See history of present illness. GU:  Negative for dysuria, hematuria, urinary incontinence, urinary frequency, nocturnal urination.  Endo: Negative for unusual weight change.    Physical Examination:   BP 138/82 (BP Location: Right Arm, Patient Position: Sitting, Cuff Size: Normal)   Pulse 84   Temp 98 F (36.7 C) (Oral)   Ht 5\' 9"  (1.753 m)   Wt 134 lb 6.4 oz (61 kg)   BMI 19.85 kg/m   General: Well-nourished, well-developed in no acute distress.  Eyes: No icterus. Conjunctivae pink. Mouth: Oropharyngeal mucosa moist and pink , no lesions erythema or exudate. Lungs: Clear to auscultation bilaterally. Non-labored. Heart: Regular rate and rhythm, no murmurs rubs or gallops.  Abdomen: Bowel sounds are normal, nontender, nondistended, no hepatosplenomegaly or masses, no abdominal bruits or hernia , no rebound or guarding.   Extremities: No lower extremity edema. No clubbing or deformities. Neuro: Alert and oriented x 3.  Grossly intact. Skin: Warm and dry, no  jaundice.   Psych: Alert and cooperative, normal mood and affect.   Imaging Studies: Dg Chest 2 View  Result Date: 05/12/2017 CLINICAL DATA:  Dyspnea EXAM: CHEST  2 VIEW COMPARISON:  05/02/2017 chest radiograph. FINDINGS: Stable configuration of single lead left subclavian ICD. Stable cardiomediastinal silhouette with mild cardiomegaly. No pneumothorax. No pleural effusion. Lungs appear clear, with no acute consolidative airspace disease and no pulmonary edema.  IMPRESSION: Stable mild cardiomegaly without overt pulmonary edema. No active pulmonary disease. Electronically Signed   By: Delbert Phenix M.D.   On: 05/12/2017 15:55   Dg Chest Port 1 View  Result Date: 05/02/2017 CLINICAL DATA:  Shortness of breath.  Productive cough. EXAM: PORTABLE CHEST 1 VIEW COMPARISON:  03/15/2017 FINDINGS: A single lead ICD remains in place. The cardiac silhouette remains mildly enlarged. Minimal streaky opacity in the left lung base and in the right infrahilar region is similar to the prior study. No lobar consolidation, edema, sizable pleural effusion, or pneumothorax is identified. No acute osseous abnormality is seen. IMPRESSION: Minimal bibasilar opacity, likely atelectasis. Electronically Signed   By: Sebastian Ache M.D.   On: 05/02/2017 02:24   [employer] [employer]  Result Date: 05/13/2017 CLINICAL DATA:  Acute renal failure EXAM: RENAL / URINARY TRACT ULTRASOUND COMPLETE COMPARISON:  None. FINDINGS: Right Kidney: Length: Enlarged, 16.4 cm. Innumerable cysts throughout the right kidney compatible with polycystic kidney disease, the largest 3.7 cm in the upper pole. Increased echotexture throughout the renal parenchyma. Echogenicity within normal limits. No hydronephrosis. Left Kidney: Length: None large, approximately 15 cm. No hydronephrosis. Innumerable cysts, the largest 3.2 cm. Increased echotexture throughout the renal parenchyma. Bladder: Appears normal for degree of bladder distention. IMPRESSION: Innumerable bilateral renal cysts. Kidneys are enlarged. Findings most compatible with polycystic kidney disease. No hydronephrosis. Increased echotexture throughout the kidneys. Electronically Signed   By: Charlett Nose M.D.   On: 05/13/2017 12:19    Assessment and Plan:   Kelly Leonard is a 61 y.o. y/o male with chronic hepatitis C GT 1b, treatment naive with CKD GFR< 30 ,Hepatitis B cab positive, heart failure here for follow up . Recent hospitalization for heart  failure   Plan :  1.Hep A vaccine 2. Liver elastography to determine degree of fibrosis  3. After USG will assess if he is ready for Hep C treatment. Ideally he should not be missing any doses to ensure cure. Once his heart failure is reasonably stable will plan to start Rx so as to avoid any missed doses from any hospitalization , will need close follow up monitoring for reactivation of hepatitis B while in treatment . Options for treatment which I will consider based on his labs and childs pugh score will be Daily fixed-dose combination of elbasvir (50 mg)/grazoprevir (100 mg)  12 weeks or Daily fixed-dose combination of glecaprevir (300 mg)/pibrentasvir (120 mg) 4. Hepatic function panel and INR   Dr Wyline Mood  MD,MRCP [facility]) Follow up in 2-3 weeks after liver elastrography

## 2017-05-15 NOTE — Discharge Summary (Signed)
Kelly Leonard, is a 61 y.o. male  DOB May 31, 1956  MRN [id number].  Admission date:  05/12/2017  Admitting Physician  Katha Hamming, MD  Discharge Date:  05/14/2017   Primary MD  Gabriel Cirri, NP  Recommendations for primary care physician for things to follow:   Follow-up right heart failure clinic in 1 week   Admission Diagnosis  SOB (shortness of breath) [R06.02] Acute on chronic congestive heart failure, unspecified heart failure type (HCC) [I50.9]   Discharge Diagnosis  SOB (shortness of breath) [R06.02] Acute on chronic congestive heart failure, unspecified heart failure type (HCC) [I50.9]    Active Problems:   Acute CHF (congestive heart failure) (HCC)      Past Medical History:  Diagnosis Date  . Anxiety   . CHF (congestive heart failure) (HCC)   . Chronic systolic congestive heart failure (HCC)   . CKD (chronic kidney disease) stage 4, GFR 15-29 ml/min (HCC)   . COPD (chronic obstructive pulmonary disease) (HCC)   . Dilated cardiomyopathy (HCC)   . Hyperlipidemia   . Hypertension   . MI (myocardial infarction) (HCC)   . Polycystic kidney   . Renal disorder     Past Surgical History:  Procedure Laterality Date  . CARDIAC CATHETERIZATION    . IMPLANTABLE CARDIOVERTER DEFIBRILLATOR (ICD) GENERATOR CHANGE Right 06/23/2015   Procedure: ICD GENERATOR  INITIAL IMPLANT;  Surgeon: Sharion Settler, MD;  Location: [facility] ORS;  Service: Cardiovascular;  Laterality: Right;       History of present illness and  Hospital Course:     Kindly see H&P for history of present illness and admission details, please review complete Labs, Consult reports and Test reports for all details in brief  HPI  from the history and physical done on the day of admission 61 year old male patient came in because of shortness  of breath, failed outpatient therapy with high-dose Lasix.   Hospital Course  An acute on chronic systolic heart failure. Patient received IV Lasix, he felt better with Lasix but his kidney function decreased, decrease the dose of IV Lasix. Patient had about 3 L of urinehe felt much better, discharged home with Lasix 40 mg twice a day.echo showed  ejection fraction 20-25%. He already has ICD. He  has intolerance to beta blockers associated with dizziness #2 acute on chronic systolic heart failure;acute chronic kidney disease stage IV: Baseline creatinine 2.92. Developed acute renal failure with acute cardiorenal syndrome. Symptoms improved with IV Lasix. Decrease the dose of  Valsartan to started  to 40 mg at discharge 3, hepatitis C;followed by GI Discharge Condition: stable   Follow UP  Follow-up Information    [facility]. Go on 05/22/2017.   Specialty:  Cardiology Why:  at 10:20am Contact information: [address] [place] [address] [phone]       Antonieta Iba, MD Follow up in 1 week(s).   Specialty:  Cardiology Contact information: [address] [place] [address] [phone]        Lamont Dowdy, MD Follow up in 1 week(s).   Specialty:  Internal Medicine Contact information: [address] [place] [address] [phone]             Discharge Instructions  and  Discharge Medications      Allergies as of 05/14/2017      Reactions   Penicillins Anaphylaxis, Swelling   Has patient had a PCN reaction  causing immediate rash, facial/tongue/throat swelling, SOB or lightheadedness with hypotension: Yes Has patient had a PCN reaction causing severe rash involving mucus membranes or skin necrosis: No Has patient had a PCN reaction that required hospitalization: No Has patient had a PCN reaction occurring within the last 10 years: No If all of  the above answers are "NO", then may proceed with Cephalosporin use.   Ace Inhibitors Rash   Carvedilol Other (See Comments)   "dizziness, light headed, and nausea"   Torsemide Hives, Itching, Nausea And Vomiting, Rash      Medication List    STOP taking these medications   torsemide 20 MG tablet Commonly known as:  DEMADEX   triamcinolone cream 0.1 % Commonly known as:  KENALOG     TAKE these medications   albuterol 108 (90 Base) MCG/ACT inhaler Commonly known as:  PROVENTIL HFA;VENTOLIN HFA Inhale 2 puffs into the lungs every 6 (six) hours as needed for wheezing or shortness of breath.   aspirin EC 81 MG tablet Take 81 mg by mouth daily.   furosemide 40 MG tablet Commonly known as:  LASIX Take 1 tablet (40 mg total) by mouth 3 (three) times daily. What changed:  how much to take  when to take this   sertraline 25 MG tablet Commonly known as:  ZOLOFT Take 1 tablet (25 mg total) by mouth daily.   valsartan 40 MG tablet Commonly known as:  DIOVAN Take 1 tablet (40 mg total) by mouth daily. What changed:  medication strength  how much to take            Discharge Care Instructions        Start     Ordered   05/14/17 0000  furosemide (LASIX) 40 MG tablet  3 times daily     05/14/17 1214   05/14/17 0000  valsartan (DIOVAN) 40 MG tablet  Daily     05/14/17 1214        Diet and Activity recommendation: See Discharge Instructions above   Consults obtained - cardio   Major procedures and Radiology Reports - PLEASE review detailed and final reports for all details, in brief -     Dg Chest 2 View  Result Date: 05/12/2017 CLINICAL DATA:  Dyspnea EXAM: CHEST  2 VIEW COMPARISON:  05/02/2017 chest radiograph. FINDINGS: Stable configuration of single lead left subclavian ICD. Stable cardiomediastinal silhouette with mild cardiomegaly. No pneumothorax. No pleural effusion. Lungs appear clear, with no acute consolidative airspace disease and no pulmonary  edema. IMPRESSION: Stable mild cardiomegaly without overt pulmonary edema. No active pulmonary disease. Electronically Signed   By: Delbert Phenix M.D.   On: 05/12/2017 15:55   Dg Chest Port 1 View  Result Date: 05/02/2017 CLINICAL DATA:  Shortness of breath.  Productive cough. EXAM: PORTABLE CHEST 1 VIEW COMPARISON:  03/15/2017 FINDINGS: A single lead ICD remains in place. The cardiac silhouette remains mildly enlarged. Minimal streaky opacity in the left lung base and in the right infrahilar region is similar to the prior study. No lobar consolidation, edema, sizable pleural effusion, or pneumothorax is identified. No acute osseous abnormality is seen. IMPRESSION: Minimal bibasilar opacity, likely atelectasis. Electronically Signed   By: Sebastian Ache M.D.   On: 05/02/2017 02:24   [employer] [employer]  Result Date: 05/13/2017 CLINICAL DATA:  Acute renal failure EXAM: RENAL / URINARY TRACT ULTRASOUND COMPLETE COMPARISON:  None. FINDINGS: Right Kidney: Length: Enlarged, 16.4 cm. Innumerable cysts throughout the right kidney compatible with polycystic kidney disease,  the largest 3.7 cm in the upper pole. Increased echotexture throughout the renal parenchyma. Echogenicity within normal limits. No hydronephrosis. Left Kidney: Length: None large, approximately 15 cm. No hydronephrosis. Innumerable cysts, the largest 3.2 cm. Increased echotexture throughout the renal parenchyma. Bladder: Appears normal for degree of bladder distention. IMPRESSION: Innumerable bilateral renal cysts. Kidneys are enlarged. Findings most compatible with polycystic kidney disease. No hydronephrosis. Increased echotexture throughout the kidneys. Electronically Signed   By: Charlett Nose M.D.   On: 05/13/2017 12:19    Micro Results     No results found for this or any previous visit (from the past 240 hour(s)).     Today   Subjective:   Zymari Bayer today has no headache,no chest abdominal pain,no new weakness tingling  or numbness, feels much better wants to go home today.   Objective:   Blood pressure (!) 144/83, pulse 70, temperature 97.9 F (36.6 C), temperature source Oral, resp. rate 16, height 5\' 9"  (1.753 m), weight 58 kg (127 lb 14.4 oz), SpO2 97 %.  No intake or output data in the 24 hours ending 05/15/17 1420  Exam Awake Alert, Oriented x 3, No new F.N deficits, Normal affect [place].AT,PERRAL Supple Neck,No JVD, No cervical lymphadenopathy appriciated.  Symmetrical Chest wall movement, Good air movement bilaterally, CTAB RRR,No Gallops,Rubs or new Murmurs, No Parasternal Heave +ve B.Sounds, Abd Soft, Non tender, No organomegaly appriciated, No rebound -guarding or rigidity. No Cyanosis, Clubbing or edema, No new Rash or bruise  Data Review   CBC w Diff:  Lab Results  Component Value Date   WBC 8.2 05/13/2017   HGB 12.0 (L) 05/13/2017   HGB WILL FOLLOW 03/24/2017   HGB 13.1 03/24/2017   HCT 36.7 (L) 05/13/2017   HCT WILL FOLLOW 03/24/2017   HCT 41.5 03/24/2017   PLT 375 05/13/2017   PLT WILL FOLLOW 03/24/2017   PLT 409 (H) 03/24/2017   LYMPHOPCT 25 03/15/2017   MONOPCT 6 03/15/2017   EOSPCT 3 03/15/2017   BASOPCT 1 03/15/2017    CMP:  Lab Results  Component Value Date   NA 139 05/14/2017   NA 142 02/12/2017   NA 145 11/12/2012   K 3.8 05/14/2017   K 4.1 11/12/2012   CL 101 05/14/2017   CL 113 (H) 11/12/2012   CO2 26 05/14/2017   CO2 26 11/12/2012   BUN 90 (H) 05/14/2017   BUN 35 (H) 02/12/2017   BUN 41 (H) 11/12/2012   CREATININE 3.48 (H) 05/14/2017   CREATININE 2.17 (H) 11/12/2012   PROT 7.4 05/14/2017   PROT 8.1 02/12/2017   PROT 7.1 11/12/2012   ALBUMIN 3.6 05/14/2017   ALBUMIN 4.6 02/12/2017   ALBUMIN 3.1 (L) 11/12/2012   BILITOT 1.2 05/14/2017   BILITOT 0.6 02/12/2017   BILITOT 1.0 11/12/2012   ALKPHOS 67 05/14/2017   ALKPHOS 73 11/12/2012   AST 106 (H) 05/14/2017   AST 25 11/12/2012   ALT 89 (H) 05/14/2017   ALT 25 11/12/2012  .   Total Time in  preparing paper work, data evaluation and todays exam - 35 minutes  Ojani Berenson M.D on 05/14/2017 at 2:20 PM    Note: This dictation was prepared with Dragon dictation along with smaller phrase technology. Any transcriptional errors that result from this process are unintentional.

## 2017-05-16 ENCOUNTER — Ambulatory Visit (INDEPENDENT_AMBULATORY_CARE_PROVIDER_SITE_OTHER): Payer: BLUE CROSS/BLUE SHIELD | Admitting: Unknown Physician Specialty

## 2017-05-16 ENCOUNTER — Encounter: Payer: Self-pay | Admitting: Unknown Physician Specialty

## 2017-05-16 ENCOUNTER — Telehealth: Payer: Self-pay

## 2017-05-16 VITALS — BP 132/91 | HR 92 | Temp 98.4°F | Wt 139.0 lb

## 2017-05-16 DIAGNOSIS — Z23 Encounter for immunization: Secondary | ICD-10-CM | POA: Diagnosis not present

## 2017-05-16 DIAGNOSIS — N184 Chronic kidney disease, stage 4 (severe): Secondary | ICD-10-CM

## 2017-05-16 DIAGNOSIS — I420 Dilated cardiomyopathy: Secondary | ICD-10-CM | POA: Diagnosis not present

## 2017-05-16 DIAGNOSIS — Z09 Encounter for follow-up examination after completed treatment for conditions other than malignant neoplasm: Secondary | ICD-10-CM

## 2017-05-16 MED ORDER — LORAZEPAM 0.5 MG PO TABS: 0.5000 mg | ORAL_TABLET | Freq: Every day | ORAL | 1 refills | Status: DC

## 2017-05-16 MED ORDER — SERTRALINE HCL 50 MG PO TABS
50.0000 mg | ORAL_TABLET | Freq: Every day | ORAL | 1 refills | Status: DC
Start: 2017-05-16 — End: 2017-05-26

## 2017-05-16 NOTE — Telephone Encounter (Signed)
Advised patient of Liver Elastography scheduling.  [facility] @ 915am on 9/11. NPO Midnight

## 2017-05-16 NOTE — Progress Notes (Signed)
BP (!) 132/91 (BP Location: Left Arm, Cuff Size: Small)   Pulse 92   Temp 98.4 F (36.9 C)   Wt 139 lb (63 kg)   SpO2 96%   BMI 20.53 kg/m    Subjective:    Patient ID: Kelly Leonard, male    DOB: May 29, 1956, 61 y.o.   MRN: [id number]  HPI: Kelly Leonard is a 61 y.o. male  Chief Complaint  Patient presents with  . Hospitalization Follow-up  . Anxiety    pt states he has been experiencing a lot of anxiety   Pt admitted to hospital 05/14/17 due to CHF  Pt was placed on Bumex due to decreased renal function from Lasix.  However, broke out with Bumex and felt better with lasix.  He was admitted, put back on lasix.  His EF is 20-25%.    He also sees Dr. Thedore Mins for polycystic kidneys Due to see Dr Mariah Milling 10/2 Due to the heart failure clinic 9/17  Pt would like something to help him with his anxiety.  He has tried CBT oil and Sertraline 25 mg.    Relevant past medical, surgical, family and social history reviewed and updated as indicated. Interim medical history since our last visit reviewed. Allergies and medications reviewed and updated.  Review of Systems  Per HPI unless specifically indicated above     Objective:    BP (!) 132/91 (BP Location: Left Arm, Cuff Size: Small)   Pulse 92   Temp 98.4 F (36.9 C)   Wt 139 lb (63 kg)   SpO2 96%   BMI 20.53 kg/m   Wt Readings from Last 3 Encounters:  05/16/17 139 lb (63 kg)  05/14/17 134 lb 6.4 oz (61 kg)  05/14/17 127 lb 14.4 oz (58 kg)    Physical Exam  Constitutional: He is oriented to person, place, and time. He appears well-developed and well-nourished. No distress.  HENT:  Head: Normocephalic and atraumatic.  Eyes: Conjunctivae and lids are normal. Right eye exhibits no discharge. Left eye exhibits no discharge. No scleral icterus.  Neck: Normal range of motion. Neck supple. No JVD present. Carotid bruit is not present.  Cardiovascular: Normal rate, regular rhythm and normal heart sounds.   Pulmonary/Chest:  Effort normal and breath sounds normal. No respiratory distress.  Abdominal: Normal appearance. There is no splenomegaly or hepatomegaly.  Musculoskeletal: Normal range of motion.  Neurological: He is alert and oriented to person, place, and time.  Skin: Skin is warm, dry and intact. No rash noted. No pallor.  Psychiatric: He has a normal mood and affect. His behavior is normal. Judgment and thought content normal.    Results for orders placed or performed during the hospital encounter of 05/14/17  Protime-INR  Result Value Ref Range   Prothrombin Time 14.6 11.4 - 15.2 seconds   INR 1.15   Hepatic function panel  Result Value Ref Range   Total Protein 7.4 6.5 - 8.1 g/dL   Albumin 3.6 3.5 - 5.0 g/dL   AST 677 (H) 15 - 41 U/L   ALT 89 (H) 17 - 63 U/L   Alkaline Phosphatase 67 38 - 126 U/L   Total Bilirubin 1.2 0.3 - 1.2 mg/dL   Bilirubin, Direct 0.3 0.1 - 0.5 mg/dL   Indirect Bilirubin 0.9 0.3 - 0.9 mg/dL        Assessment & Plan:   Problem List Items Addressed This Visit      Unprioritized   CKD (chronic kidney disease) stage 4,  GFR 15-29 ml/min (HCC)    Sees Dr. Thedore Mins      Relevant Orders   Basic Metabolic Panel (BMET)   Dilated cardiomyopathy (HCC)    Stable today.  Minimal edema.  Heart failure clinic on the 17th.         Other Visit Diagnoses    Need for hepatitis A vaccination    -  Primary   Relevant Orders   Hepatitis A vaccine adult IM (Completed)   Hospital discharge follow-up           Follow up plan: Return in about 2 weeks (around 05/30/2017).

## 2017-05-16 NOTE — Assessment & Plan Note (Signed)
Stable today.  Minimal edema.  Heart failure clinic on the 17th.

## 2017-05-16 NOTE — Patient Instructions (Addendum)
Hepatitis A Vaccine: What You Need to Know  1. Why get vaccinated?  Hepatitis A is a serious liver disease. It is caused by the hepatitis A virus (HAV). HAV is spread from person to person through contact with the feces (stool) of people who are infected, which can easily happen if someone does not wash his or her hands properly. You can also get hepatitis A from food, water, or objects contaminated with HAV.  Symptoms of hepatitis A can include:  · fever, fatigue, loss of appetite, nausea, vomiting, and/or joint pain  · severe stomach pains and diarrhea (mainly in children), or  · jaundice (yellow skin or eyes, dark urine, clay-colored bowel movements).    These symptoms usually appear 2 to 6 weeks after exposure and usually last less than 2 months, although some people can be ill for as long as 6 months. If you have hepatitis A you may be too ill to work.  Children often do not have symptoms, but most adults do. You can spread HAV without having symptoms.  Hepatitis A can cause liver failure and death, although this is rare and occurs more commonly in persons 50 years of age or older and persons with other liver diseases, such as hepatitis B or C.  Hepatitis A vaccine can prevent hepatitis A. Hepatitis A vaccines were recommended in the [place] beginning in 1996. Since then, the number of cases reported each year in the [place] has dropped from around 31,000 cases to fewer than 1,500 cases.  2. Hepatitis A vaccine  Hepatitis A vaccine is an inactivated (killed) vaccine. You will need 2 doses for long-lasting protection. These doses should be given at least 6 months apart.  Children are routinely vaccinated between their first and second birthdays (12 through 23 months of age). Older children and adolescents can get the vaccine after 23 months. Adults who have not been vaccinated previously and want to be protected against hepatitis A can also get the vaccine.  You should get hepatitis A vaccine if you:  · are  traveling to countries where hepatitis A is common,  · are a man who has sex with other men,  · use illegal drugs,  · have a chronic liver disease such as hepatitis B or hepatitis C,  · are being treated with clotting-factor concentrates,  · work with hepatitis A-infected animals or in a hepatitis A research laboratory, or  · expect to have close personal contact with an international adoptee from a country where hepatitis A is common    Ask your healthcare provider if you want more information about any of these groups.  There are no known risks to getting hepatitis A vaccine at the same time as other vaccines.  3. Some people should not get this vaccine  Tell the person who is giving you the vaccine:  · If you have any severe, life-threatening allergies. If you ever had a life-threatening allergic reaction after a dose of hepatitis A vaccine, or have a severe allergy to any part of this vaccine, you may be advised not to get vaccinated. Ask your health care provider if you want information about vaccine components.  · If you are not feeling well. If you have a mild illness, such as a cold, you can probably get the vaccine today. If you are moderately or severely ill, you should probably wait until you recover. Your doctor can advise you.    4. Risks of a vaccine reaction  With any medicine,   including vaccines, there is a chance of side effects. These are usually mild and go away on their own, but serious reactions are also possible.  Most people who get hepatitis A vaccine do not have any problems with it.  Minor problems following hepatitis A vaccine include:  · soreness or redness where the shot was given  · low-grade fever  · headache  · tiredness    If these problems occur, they usually begin soon after the shot and last 1 or 2 days.  Your doctor can tell you more about these reactions.  Other problems that could happen after this vaccine:  · People sometimes faint after a medical procedure, including  vaccination. Sitting or lying down for about 15 minutes can help prevent fainting, and injuries caused by a fall. Tell your provider if you feel dizzy, or have vision changes or ringing in the ears.  · Some people get shoulder pain that can be more severe and longer lasting than the more routine soreness that can follow injections. This happens very rarely.  · Any medication can cause a severe allergic reaction. Such reactions from a vaccine are very rare, estimated at about 1 in a million doses, and would happen within a few minutes to a few hours after the vaccination.  As with any medicine, there is a very remote chance of a vaccine causing a serious injury or death.  The safety of vaccines is always being monitored. For more information, visit: www.cdc.gov/vaccinesafety/  5. What if there is a serious problem?  What should I look for?  Look for anything that concerns you, such as signs of a severe allergic reaction, very high fever, or unusual behavior.  Signs of a severe allergic reaction can include hives, swelling of the face and throat, difficulty breathing, a fast heartbeat, dizziness, and weakness. These would start a few minutes to a few hours after the vaccination.  What should I do?  · If you think it is a severe allergic reaction or other emergency that can't wait, call 9-1-1 or get to the nearest hospital. Otherwise, call your clinic.  · Afterward, the reaction should be reported to the Vaccine Adverse Event Reporting System (VAERS). Your doctor should file this report, or you can do it yourself through the VAERS web site at www.vaers.hhs.gov, or by calling 1-800-822-7967.  ? VAERS does not give medical advice.  6. The National Vaccine Injury Compensation Program  The National Vaccine Injury Compensation Program (VICP) is a federal program that was created to compensate people who may have been injured by certain vaccines.  Persons who believe they may have been injured by a vaccine can learn about  the program and about filing a claim by calling 1-800-338-2382 or visiting the VICP website at www.hrsa.gov/vaccinecompensation. There is a time limit to file a claim for compensation.  7. How can I learn more?  · Ask your healthcare provider. He or she can give you the vaccine package insert or suggest other sources of information.  · Call your local or state health department.  · Contact the Centers for Disease Control and Prevention (CDC):  ? Call 1-800-232-4636 (1-800-CDC-INFO) or  ? Visit CDC's website at www.cdc.gov/vaccines  CDC Hepatitis A Vaccine VIS (03/29/2015)  This information is not intended to replace advice given to you by your health care provider. Make sure you discuss any questions you have with your health care provider.  Document Released: 06/20/2006 Document Revised: 05/16/2016 Document Reviewed: 05/16/2016  Elsevier Interactive Patient   Education © 2017 [employer]

## 2017-05-16 NOTE — Assessment & Plan Note (Signed)
Sees Dr. Thedore Mins

## 2017-05-17 LAB — BASIC METABOLIC PANEL
BUN/Creatinine Ratio: 24 (ref 10–24)
BUN: 65 mg/dL — ABNORMAL HIGH (ref 8–27)
CO2: 24 mmol/L (ref 20–29)
CREATININE: 2.73 mg/dL — AB (ref 0.76–1.27)
Calcium: 9.3 mg/dL (ref 8.6–10.2)
Chloride: 92 mmol/L — ABNORMAL LOW (ref 96–106)
GFR calc Af Amer: 28 mL/min/{1.73_m2} — ABNORMAL LOW (ref >59)
GFR calc non Af Amer: 24 mL/min/{1.73_m2} — ABNORMAL LOW (ref >59)
GLUCOSE: 106 mg/dL — AB (ref 65–99)
POTASSIUM: 4.9 mmol/L (ref 3.5–5.2)
SODIUM: 137 mmol/L (ref 134–144)

## 2017-05-19 ENCOUNTER — Encounter: Payer: Self-pay | Admitting: Unknown Physician Specialty

## 2017-05-19 ENCOUNTER — Telehealth: Payer: Self-pay

## 2017-05-19 NOTE — Telephone Encounter (Signed)
Patient to have urine screen completed tomorrow prior to [place] at [facility].

## 2017-05-19 NOTE — Telephone Encounter (Signed)
-----   Message from Wyline Mood, MD sent at 05/18/2017  9:23 PM EDT ----- LFT- slighlty elevated- check if he got drug screen urine test

## 2017-05-20 ENCOUNTER — Ambulatory Visit
Admission: RE | Admit: 2017-05-20 | Discharge: 2017-05-20 | Disposition: A | Discharge status: Home / Self Care | Payer: BLUE CROSS/BLUE SHIELD | Source: Ambulatory Visit | Attending: Gastroenterology | Admitting: Gastroenterology

## 2017-05-20 ENCOUNTER — Other Ambulatory Visit
Admission: RE | Admit: 2017-05-20 | Discharge: 2017-05-20 | Disposition: A | Discharge status: Home / Self Care | Payer: BLUE CROSS/BLUE SHIELD | Source: Ambulatory Visit | Attending: Gastroenterology | Admitting: Gastroenterology

## 2017-05-20 ENCOUNTER — Encounter: Payer: Self-pay | Admitting: Gastroenterology

## 2017-05-20 DIAGNOSIS — B1920 Unspecified viral hepatitis C without hepatic coma: Secondary | ICD-10-CM | POA: Insufficient documentation

## 2017-05-20 DIAGNOSIS — N184 Chronic kidney disease, stage 4 (severe): Secondary | ICD-10-CM | POA: Diagnosis present

## 2017-05-20 LAB — URINE DRUG SCREEN, QUALITATIVE (ARMC ONLY)
AMPHETAMINES, UR SCREEN: NOT DETECTED
BARBITURATES, UR SCREEN: NOT DETECTED
BENZODIAZEPINE, UR SCRN: NOT DETECTED
COCAINE METABOLITE, UR ~~LOC~~: NOT DETECTED
Cannabinoid 50 Ng, Ur ~~LOC~~: NOT DETECTED
MDMA (Ecstasy)Ur Screen: NOT DETECTED
METHADONE SCREEN, URINE: NOT DETECTED
Opiate, Ur Screen: NOT DETECTED
Phencyclidine (PCP) Ur S: NOT DETECTED
TRICYCLIC, UR SCREEN: NOT DETECTED

## 2017-05-22 ENCOUNTER — Encounter: Payer: Self-pay | Admitting: Family Medicine

## 2017-05-22 ENCOUNTER — Ambulatory Visit: Payer: BLUE CROSS/BLUE SHIELD | Admitting: Family

## 2017-05-22 ENCOUNTER — Ambulatory Visit (INDEPENDENT_AMBULATORY_CARE_PROVIDER_SITE_OTHER): Payer: BLUE CROSS/BLUE SHIELD | Admitting: Family Medicine

## 2017-05-22 VITALS — BP 141/93 | HR 84 | Temp 96.6°F | Wt 140.8 lb

## 2017-05-22 DIAGNOSIS — F419 Anxiety disorder, unspecified: Secondary | ICD-10-CM

## 2017-05-22 DIAGNOSIS — T7840XA Allergy, unspecified, initial encounter: Secondary | ICD-10-CM | POA: Diagnosis not present

## 2017-05-22 DIAGNOSIS — N184 Chronic kidney disease, stage 4 (severe): Secondary | ICD-10-CM

## 2017-05-22 DIAGNOSIS — I5022 Chronic systolic (congestive) heart failure: Secondary | ICD-10-CM

## 2017-05-22 LAB — CBC WITH DIFFERENTIAL/PLATELET
Hematocrit: 39 % (ref 37.5–51.0)
Hemoglobin: 12.7 g/dL — ABNORMAL LOW (ref 13.0–17.7)
LYMPHS ABS: 1.1 10*3/uL (ref 0.7–3.1)
LYMPHS: 10 %
MCH: 26.3 pg — ABNORMAL LOW (ref 26.6–33.0)
MCHC: 32.6 g/dL (ref 31.5–35.7)
MCV: 81 fL (ref 79–97)
MID (ABSOLUTE): 0.9 10*3/uL (ref 0.1–1.6)
MID: 8 %
NEUTROS PCT: 82 %
Neutrophils Absolute: 8.8 10*3/uL — ABNORMAL HIGH (ref 1.4–7.0)
Platelets: 311 10*3/uL (ref 150–379)
RBC: 4.82 x10E6/uL (ref 4.14–5.80)
RDW: 14.8 % (ref 12.3–15.4)
WBC: 10.8 10*3/uL (ref 3.4–10.8)

## 2017-05-22 MED ORDER — GABAPENTIN 300 MG PO CAPS: ORAL_CAPSULE | ORAL | 0 refills | Status: DC

## 2017-05-22 MED ORDER — TRIAMCINOLONE ACETONIDE 40 MG/ML IJ SUSP
40.0000 mg | Freq: Once | INTRAMUSCULAR | Status: AC
  Administered 2017-05-22: 40 mg via INTRAMUSCULAR

## 2017-05-22 MED ORDER — LOSARTAN POTASSIUM 50 MG PO TABS: 50.0000 mg | ORAL_TABLET | Freq: Every day | ORAL | 0 refills | Status: DC

## 2017-05-22 NOTE — Progress Notes (Signed)
BP (!) 141/93   Pulse 84   Temp (!) 96.6 F (35.9 C)   Wt 140 lb 12.8 oz (63.9 kg)   SpO2 100%   BMI 20.79 kg/m    Subjective:    Patient ID: Kelly Leonard, male    DOB: Oct 21, 1955, 61 y.o.   MRN: [id number]  HPI: Kelly Leonard is a 61 y.o. male  Chief Complaint  Patient presents with  . Medication Reaction    pt's wife states that the patient has been having an allergic reaction to sertraline and lorazepam. States the patient had a rash on his back and has been very jittery    Patient presents with intensely pruritic rash since taking an ativan yesterday. This was his second time taking the medication. Feels very anxious, jittery, SOB still as well. Recent hospitalization for acute on chronic HF and renal failure with increase in PO lasix to 40 mg TID but still having some LE edema and SOB. Home O2 sats have been 100%. Was told he needed to switch BP medications as the valsartan was recalled, has not taken it since d/c from hospital.  The ativan was given for his severe anxiety and hyperactivity that has been a recent change. Wife states he was given some sort of RLS medicine in the hospital with good relief. Pt not sleeping, walking laps around the house constantly, very nervous.   Past Medical History:  Diagnosis Date  . Anxiety   . CHF (congestive heart failure) (HCC)   . Chronic systolic congestive heart failure (HCC)   . CKD (chronic kidney disease) stage 4, GFR 15-29 ml/min (HCC)   . COPD (chronic obstructive pulmonary disease) (HCC)   . Dilated cardiomyopathy (HCC)   . Hyperlipidemia   . Hypertension   . MI (myocardial infarction) (HCC)   . Polycystic kidney   . Renal disorder    Social History   Social History  . Marital status: Single    Spouse name: N/A  . Number of children: N/A  . Years of education: N/A   Occupational History  . Not on file.   Social History Main Topics  . Smoking status: Never Smoker  . Smokeless tobacco: Never Used  .  Alcohol use No     Comment: on occasion  . Drug use: No  . Sexual activity: Yes   Other Topics Concern  . Not on file   Social History Narrative  . No narrative on file   Relevant past medical, surgical, family and social history reviewed and updated as indicated. Interim medical history since our last visit reviewed. Allergies and medications reviewed and updated.  Review of Systems  Constitutional: Negative.   HENT: Negative.   Eyes: Negative.   Respiratory: Positive for shortness of breath.   Cardiovascular: Negative.   Gastrointestinal: Negative.   Genitourinary: Negative.   Musculoskeletal: Negative.   Neurological: Negative.   Psychiatric/Behavioral: Positive for sleep disturbance. The patient is nervous/anxious and is hyperactive.    Per HPI unless specifically indicated above     Objective:    BP (!) 141/93   Pulse 84   Temp (!) 96.6 F (35.9 C)   Wt 140 lb 12.8 oz (63.9 kg)   SpO2 100%   BMI 20.79 kg/m   Wt Readings from Last 3 Encounters:  05/22/17 140 lb 12.8 oz (63.9 kg)  05/16/17 139 lb (63 kg)  05/14/17 134 lb 6.4 oz (61 kg)    Physical Exam  Constitutional: He is oriented to person,  place, and time. He appears well-developed.  Pacing, jittery, appears very anxious  HENT:  Head: Atraumatic.  Mouth/Throat: Oropharynx is clear and moist.  Eyes: Pupils are equal, round, and reactive to light. Conjunctivae are normal.  Neck: Normal range of motion. Neck supple.  Cardiovascular: Normal rate and intact distal pulses.   Pulmonary/Chest: Effort normal and breath sounds normal. No respiratory distress. He has no rales.  Musculoskeletal: Normal range of motion. He exhibits edema (trace LE edema).  Neurological: He is alert and oriented to person, place, and time.  Skin: Skin is warm and dry. Rash (Urticarial rash across back and arms) noted.  Psychiatric:  Appears very anxious, pacing the room  Nursing note and vitals reviewed.     Assessment & Plan:     Problem List Items Addressed This Visit      Cardiovascular and Mediastinum   Chronic systolic congestive heart failure (HCC)    Due to f/u with HF clinic early next week. Keep current regimen until then as 80 mg lasix daily was insufficient. Will start losartan in place of the valsartan until this f/u. Start compression hose, leg elevation. Continue watching salt intake carefully. Strict return precautions reviewed for over weekend.       Relevant Medications   losartan (COZAAR) 50 MG tablet   Other Relevant Orders   CBC With Differential/Platelet (Completed)   B Nat Peptide (Completed)     Genitourinary   CKD (chronic kidney disease) stage 4, GFR 15-29 ml/min (HCC)    Followed by Nephrology, continue per their plan        Other   Anxiety disorder, unspecified    Severe flare the past month or so, and with medication reaction to benzos. Cannot take benadryl or hydroxyzine as it causes worsened activation in him. Will try some gabapentin as he had some relief with RLS type tx in hospital. Will revisit any benefit at upcoming f/u appt.        Other Visit Diagnoses    Allergic reaction to drug, initial encounter    -  Primary   IM kenalog given today. Continue clobetasol cream prn and good moisturizer regimen.    Relevant Medications   triamcinolone acetonide (KENALOG-40) injection 40 mg (Completed)      Follow up plan: Return for as scheduled.

## 2017-05-23 LAB — BRAIN NATRIURETIC PEPTIDE: BNP: 3871 pg/mL — AB (ref 0.0–100.0)

## 2017-05-23 NOTE — Assessment & Plan Note (Signed)
Due to f/u with HF clinic early next week. Keep current regimen until then as 80 mg lasix daily was insufficient. Will start losartan in place of the valsartan until this f/u. Start compression hose, leg elevation. Continue watching salt intake carefully. Strict return precautions reviewed for over weekend.

## 2017-05-23 NOTE — Assessment & Plan Note (Signed)
Followed by Nephrology, continue per their plan

## 2017-05-23 NOTE — Patient Instructions (Signed)
Follow up as scheduled.  

## 2017-05-23 NOTE — Assessment & Plan Note (Signed)
Severe flare the past month or so, and with medication reaction to benzos. Cannot take benadryl or hydroxyzine as it causes worsened activation in him. Will try some gabapentin as he had some relief with RLS type tx in hospital. Will revisit any benefit at upcoming f/u appt.

## 2017-05-26 ENCOUNTER — Telehealth: Payer: Self-pay | Admitting: Unknown Physician Specialty

## 2017-05-26 ENCOUNTER — Encounter: Payer: Self-pay | Admitting: Family

## 2017-05-26 ENCOUNTER — Ambulatory Visit: Payer: BLUE CROSS/BLUE SHIELD | Attending: Family | Admitting: Family

## 2017-05-26 VITALS — BP 123/72 | HR 88 | Resp 18 | Ht 69.0 in | Wt 150.5 lb

## 2017-05-26 DIAGNOSIS — I509 Heart failure, unspecified: Secondary | ICD-10-CM | POA: Insufficient documentation

## 2017-05-26 DIAGNOSIS — I130 Hypertensive heart and chronic kidney disease with heart failure and stage 1 through stage 4 chronic kidney disease, or unspecified chronic kidney disease: Secondary | ICD-10-CM | POA: Diagnosis not present

## 2017-05-26 DIAGNOSIS — I252 Old myocardial infarction: Secondary | ICD-10-CM | POA: Diagnosis not present

## 2017-05-26 DIAGNOSIS — J449 Chronic obstructive pulmonary disease, unspecified: Secondary | ICD-10-CM | POA: Insufficient documentation

## 2017-05-26 DIAGNOSIS — E785 Hyperlipidemia, unspecified: Secondary | ICD-10-CM | POA: Diagnosis not present

## 2017-05-26 DIAGNOSIS — N183 Chronic kidney disease, stage 3 (moderate): Secondary | ICD-10-CM | POA: Diagnosis not present

## 2017-05-26 DIAGNOSIS — I5022 Chronic systolic (congestive) heart failure: Secondary | ICD-10-CM

## 2017-05-26 DIAGNOSIS — F419 Anxiety disorder, unspecified: Secondary | ICD-10-CM | POA: Insufficient documentation

## 2017-05-26 DIAGNOSIS — I110 Hypertensive heart disease with heart failure: Secondary | ICD-10-CM | POA: Diagnosis present

## 2017-05-26 DIAGNOSIS — I10 Essential (primary) hypertension: Secondary | ICD-10-CM

## 2017-05-26 MED ORDER — SACUBITRIL-VALSARTAN 24-26 MG PO TABS: 1.0000 | ORAL_TABLET | Freq: Two times a day (BID) | ORAL | 5 refills | Status: DC

## 2017-05-26 NOTE — Telephone Encounter (Signed)
Patient called to see if [facility] had faxed the script for patients compression stockings or if the script was here.  Please advise.  Thanks   [phone]

## 2017-05-26 NOTE — Progress Notes (Addendum)
Agree with pharmacist note below.  Physical exam and ROS done by myself.  Does do some swimming for exercise along with mild weights. Reminded to start slowly and see how he feels the following day to determine if he's done too much before increasing his activity.  Discussed changing his losartan to entresto and he's agreeable to trying this. Instructed to begin the entesto twice daily and to stop the losartan. Will check a BMP in 2 weeks to evaluate his renal function.   He's already got a prescription for TED hose and has to get them picked up. Encouraged him to also elevate his legs as much as possible along with wearing the TED hose. Put the TED hose on in the morning and remove them at bedtime. Discussed possibly using the lymphapress compression boots in the future if the TED hose/elevation doesn't help the edema.  Return in 2 weeks or sooner for any questions/problems before the. Will get a BMP at this time.  [name], FNP HF Clinic at [facility]    Patient ID: Kelly Leonard, male    DOB: [birth year], 61 y.o.   MRN: [id number]  HPI   Kelly Leonard is a 61 year old male with a past medical history consisting of anxiety, CKDIII, COPD, HLD, HTN, hx of MI and chronic heart failure (EF 20-25%).   Most recent echo done on 05/13/17 shows LV EF 20-25% along with moderate MR/TR and moderately elevated PA pressure of 55 mmHg.   Admitted 05/12/17 due to HF exacerbation. Initially needed IV lasix and then transitioned to oral lasix. Lost ~3L of fluid while admitted. Cardiology consult obtained. Discharged home after 2 days. Was in the ED 05/02/17 due to HF exacerbation. He was treated and released. Was in the ED 03/15/17 due to HF exacerbation. He was treated and released.   Patient presents to [facility] HF clinic for initial visit with chief complaint of edema and SOB. Patient states swelling in his legs has been about the same since leaving the hospital and that the edema is probably also due to his  multiple kidney cysts. Patient says he feels like he cannot take deep breaths. ReDs vest 29% today.   Past Medical History:  Diagnosis Date  . Anxiety   . CHF (congestive heart failure) (HCC)   . Chronic systolic congestive heart failure (HCC)   . CKD (chronic kidney disease) stage 4, GFR 15-29 ml/min (HCC)   . COPD (chronic obstructive pulmonary disease) (HCC)   . Dilated cardiomyopathy (HCC)   . Hyperlipidemia   . Hypertension   . MI (myocardial infarction) (HCC)   . Polycystic kidney   . Renal disorder    Past Surgical History:  Procedure Laterality Date  . CARDIAC CATHETERIZATION    . IMPLANTABLE CARDIOVERTER DEFIBRILLATOR (ICD) GENERATOR CHANGE Right 06/23/2015   Procedure: ICD GENERATOR  INITIAL IMPLANT;  Surgeon: Sharion Settler, MD;  Location: [facility] ORS;  Service: Cardiovascular;  Laterality: Right;   Family History  Problem Relation Age of Onset  . Diabetes Mother   . Hypertension Brother   . Heart disease Maternal Grandmother    Social History  Substance Use Topics  . Smoking status: Never Smoker  . Smokeless tobacco: Never Used  . Alcohol use No     Comment: on occasion   Allergies  Allergen Reactions  . Penicillins Anaphylaxis and Swelling    Has patient had a PCN reaction causing immediate rash, facial/tongue/throat swelling, SOB or lightheadedness with hypotension: Yes Has patient had a PCN  reaction causing severe rash involving mucus membranes or skin necrosis: No Has patient had a PCN reaction that required hospitalization: No Has patient had a PCN reaction occurring within the last 10 years: No If all of the above answers are "NO", then may proceed with Cephalosporin use.   . Ace Inhibitors Rash  . Carvedilol Other (See Comments)    "dizziness, light headed, and nausea"  . Lorazepam Rash    Rash and severe anxiety  . Torsemide Hives, Itching, Nausea And Vomiting and Rash   Prior to Admission medications   Medication Sig Start Date End Date Taking?  Authorizing Provider  aspirin EC 81 MG tablet Take 81 mg by mouth daily.   Yes [provider]  furosemide (LASIX) 40 MG tablet Take 1 tablet (40 mg total) by mouth 3 (three) times daily. Patient taking differently: Take 120 mg by mouth daily.  05/14/17 05/14/18 Yes Katha Hamming, MD  gabapentin (NEURONTIN) 300 MG capsule Take 1 tablet day one, increase by 1 tablet as tolerated until 3 tablets nightly Patient taking differently: 300 mg at bedtime. Take 1 tablet day one, increase by 1 tablet as tolerated until 3 tablets nightly 05/22/17  Yes Particia Nearing, PA-C  triamcinolone cream (KENALOG) 0.1 %  05/08/17  Yes [provider]  albuterol (PROVENTIL HFA;VENTOLIN HFA) 108 (90 Base) MCG/ACT inhaler Inhale 2 puffs into the lungs every 6 (six) hours as needed for wheezing or shortness of breath. Patient not taking: Reported on 05/26/2017 03/17/17   Particia Nearing, PA-C  sacubitril-valsartan (ENTRESTO) 24-26 MG Take 1 tablet by mouth 2 (two) times daily. 05/26/17   Delma Freeze, FNP      Review of Systems  Constitutional: Negative for appetite change and fatigue.  HENT: Negative for congestion, postnasal drip and sore throat.   Eyes: Negative.   Respiratory: Positive for shortness of breath. Negative for cough and chest tightness.   Cardiovascular: Positive for leg swelling. Negative for chest pain and palpitations.  Gastrointestinal: Negative for abdominal distention and abdominal pain.  Endocrine: Negative.   Genitourinary: Negative.   Musculoskeletal: Negative for back pain and neck pain.  Skin: Negative.   Allergic/Immunologic: Negative.   Neurological: Negative for dizziness and light-headedness.  Hematological: Negative for adenopathy. Does not bruise/bleed easily.  Psychiatric/Behavioral: Negative for dysphoric mood and sleep disturbance. The patient is nervous/anxious ("at times").    Vitals:   05/26/17 1514  BP: 123/72  Pulse: 88  Resp: 18   SpO2: 99%  Weight: 150 lb 8 oz (68.3 kg)  Height:  (1.753 m)   Wt Readings from Last 3 Encounters:  05/26/17 150 lb 8 oz (68.3 kg)  05/22/17 140 lb 12.8 oz (63.9 kg)  05/16/17 139 lb (63 kg)   Lab Results  Component Value Date   CREATININE 2.73 (H) 05/16/2017   CREATININE 3.48 (H) 05/14/2017   CREATININE 4.13 (H) 05/13/2017    Physical Exam  Constitutional: He is oriented to person, place, and time. He appears well-developed and well-nourished.  HENT:  Head: Normocephalic and atraumatic.  Neck: Normal range of motion. Neck supple. No JVD present.  Cardiovascular: Normal rate and regular rhythm.   Pulmonary/Chest: Effort normal. He has no wheezes. He has no rales.  Abdominal: Soft. He exhibits no distension. There is no tenderness.  Musculoskeletal: He exhibits edema (2+ pitting edema in bilateral lower legs). He exhibits no tenderness.  Neurological: He is alert and oriented to person, place, and time.  Skin: Skin is warm and  dry.  Psychiatric: He has a normal mood and affect. His behavior is normal. Thought content normal.  Nursing note and vitals reviewed.   Assessment and Plan   1. Chronic Heart Failure with Reduced Ejection Fraction  - Most recent Echo 05/13/17 showed EF 20-25% - Patient feels fluid overloaded with SOB and leg edema - ReDs vest 29% today  - Weighing daily; Reminded him to call for an overnight weight gain of >2 pounds or a weekly weight gain of >5 pounds - Still using salt on some foods but trying to stay under  sodium/day;  Instructed to not add any salt to food and to stay away from fried food.  - BMP from 05/16/17 reviewed which shows sodium 137, potassium 4.9 and GFR 28 - Changed losartan to Entresto 24/26mg  twice daily - Instructed to elevate legs and to get fitted for compression socks.  - May need to consider metolazone or spironolactone in the future   2. Hypertension  - BP good today (123/72 mmHg)  - Checks BP daily at home -  Continue current medications    Patient did not bring his medications nor a list. Each medication was verbally reviewed with the patient and he was encouraged to bring the bottles to every visit to confirm accuracy of list.  Return in 2 weeks or sooner for any questions/problems before then.  Cleopatra Cedar, PharmD Pharmacy Resident  05/26/17

## 2017-05-26 NOTE — Telephone Encounter (Signed)
Fleet Contras wrote rx, patient notified.

## 2017-05-26 NOTE — Telephone Encounter (Signed)
Routing to provider  

## 2017-05-26 NOTE — Patient Instructions (Signed)
Continue weighing daily and call for an overnight weight gain of > 2 pounds or a weekly weight gain of >5 pounds.  Stop taking the losartan and will begin entresto 24/26mg  twice daily.

## 2017-05-28 ENCOUNTER — Telehealth: Payer: Self-pay

## 2017-05-28 NOTE — Telephone Encounter (Signed)
Reached out a second time to speak to pt wife about facial swelling reported on voicemail this morning. I was able to reach Kelly Leonard who states she spoke with a 24 hour nurse who advised that Kelly Leonard take Claritin. I advised that she speak to [facility] as the patient had a recent change in medication. Line transferred.

## 2017-05-28 NOTE — Telephone Encounter (Signed)
Patient's wife says that patient has been experiencing some facial swelling around his cheekbones ever since he started entresto on 05/26/17. Wife denies any change in his shortness of breath nor any difficulty swallowing.   Advised his wife to tell patient to stop his entresto immediately and can take OTC claritin or zyrtec. Wife says that patient is unable to tolerate benadryl. Should the swelling worsen, breathing status change or he start to have trouble swallowing, he's to present to the ED immediately. He can resume his losartan as he was tolerating that without incidence.   They are to call us back tomorrow to update status.

## 2017-05-28 NOTE — Telephone Encounter (Signed)
Left message to call.

## 2017-05-28 NOTE — Telephone Encounter (Signed)
Should go to ER or come in ASAP if becoming worseningly SOB, having difficulty with feeling like his tongue or throat is swelling, or having CP prior to Friday appt. Continue the lasix, avoid salt. May want to call the Heart Failure Clinic he just went to and get their opinion - looks like they changed a medication so could be related

## 2017-05-28 NOTE — Telephone Encounter (Signed)
See other phone call note, patient talked with heart failure clinic.

## 2017-05-28 NOTE — Telephone Encounter (Signed)
Patient called nurse line this morning and stated that now his face if swollen too.  He does have a f/u appt with Elnita Maxwell on Friday.

## 2017-05-29 ENCOUNTER — Inpatient Hospital Stay
Admission: EM | Admit: 2017-05-29 | Discharge: 2017-05-31 | DRG: 291 | Disposition: A | Discharge status: Home / Self Care | Payer: BLUE CROSS/BLUE SHIELD | Attending: Internal Medicine | Admitting: Internal Medicine

## 2017-05-29 ENCOUNTER — Emergency Department: Payer: BLUE CROSS/BLUE SHIELD

## 2017-05-29 ENCOUNTER — Encounter: Payer: Self-pay | Admitting: Emergency Medicine

## 2017-05-29 ENCOUNTER — Telehealth: Payer: Self-pay | Admitting: Family

## 2017-05-29 DIAGNOSIS — E875 Hyperkalemia: Secondary | ICD-10-CM | POA: Diagnosis not present

## 2017-05-29 DIAGNOSIS — Z7982 Long term (current) use of aspirin: Secondary | ICD-10-CM

## 2017-05-29 DIAGNOSIS — R0902 Hypoxemia: Secondary | ICD-10-CM

## 2017-05-29 DIAGNOSIS — I130 Hypertensive heart and chronic kidney disease with heart failure and stage 1 through stage 4 chronic kidney disease, or unspecified chronic kidney disease: Principal | ICD-10-CM | POA: Diagnosis present

## 2017-05-29 DIAGNOSIS — J449 Chronic obstructive pulmonary disease, unspecified: Secondary | ICD-10-CM | POA: Diagnosis present

## 2017-05-29 DIAGNOSIS — I5023 Acute on chronic systolic (congestive) heart failure: Secondary | ICD-10-CM

## 2017-05-29 DIAGNOSIS — Z79899 Other long term (current) drug therapy: Secondary | ICD-10-CM

## 2017-05-29 DIAGNOSIS — Z833 Family history of diabetes mellitus: Secondary | ICD-10-CM

## 2017-05-29 DIAGNOSIS — F419 Anxiety disorder, unspecified: Secondary | ICD-10-CM | POA: Diagnosis present

## 2017-05-29 DIAGNOSIS — I10 Essential (primary) hypertension: Secondary | ICD-10-CM | POA: Diagnosis present

## 2017-05-29 DIAGNOSIS — Z95810 Presence of automatic (implantable) cardiac defibrillator: Secondary | ICD-10-CM

## 2017-05-29 DIAGNOSIS — R778 Other specified abnormalities of plasma proteins: Secondary | ICD-10-CM

## 2017-05-29 DIAGNOSIS — N184 Chronic kidney disease, stage 4 (severe): Secondary | ICD-10-CM | POA: Diagnosis present

## 2017-05-29 DIAGNOSIS — Z9889 Other specified postprocedural states: Secondary | ICD-10-CM

## 2017-05-29 DIAGNOSIS — R7989 Other specified abnormal findings of blood chemistry: Secondary | ICD-10-CM

## 2017-05-29 DIAGNOSIS — Z8249 Family history of ischemic heart disease and other diseases of the circulatory system: Secondary | ICD-10-CM

## 2017-05-29 DIAGNOSIS — Z888 Allergy status to other drugs, medicaments and biological substances status: Secondary | ICD-10-CM

## 2017-05-29 DIAGNOSIS — I420 Dilated cardiomyopathy: Secondary | ICD-10-CM | POA: Diagnosis present

## 2017-05-29 DIAGNOSIS — Z880 Allergy status to penicillin: Secondary | ICD-10-CM

## 2017-05-29 DIAGNOSIS — I252 Old myocardial infarction: Secondary | ICD-10-CM

## 2017-05-29 LAB — CBC
HEMATOCRIT: 36.2 % — AB (ref 40.0–52.0)
HEMOGLOBIN: 12 g/dL — AB (ref 13.0–18.0)
MCH: 25.9 pg — AB (ref 26.0–34.0)
MCHC: 33.1 g/dL (ref 32.0–36.0)
MCV: 78.1 fL — ABNORMAL LOW (ref 80.0–100.0)
Platelets: 274 10*3/uL (ref 150–440)
RBC: 4.64 MIL/uL (ref 4.40–5.90)
RDW: 15.1 % — AB (ref 11.5–14.5)
WBC: 9.4 10*3/uL (ref 3.8–10.6)

## 2017-05-29 LAB — BASIC METABOLIC PANEL
Anion gap: 8 (ref 5–15)
BUN: 71 mg/dL — AB (ref 6–20)
CALCIUM: 8.7 mg/dL — AB (ref 8.9–10.3)
CHLORIDE: 102 mmol/L (ref 101–111)
CO2: 24 mmol/L (ref 22–32)
Creatinine, Ser: 2.93 mg/dL — ABNORMAL HIGH (ref 0.61–1.24)
GFR calc Af Amer: 25 mL/min — ABNORMAL LOW (ref >60)
GFR calc non Af Amer: 22 mL/min — ABNORMAL LOW (ref >60)
Glucose, Bld: 110 mg/dL — ABNORMAL HIGH (ref 65–99)
Potassium: 5.4 mmol/L — ABNORMAL HIGH (ref 3.5–5.1)
SODIUM: 134 mmol/L — AB (ref 135–145)

## 2017-05-29 LAB — BRAIN NATRIURETIC PEPTIDE: B Natriuretic Peptide: 3608 pg/mL — ABNORMAL HIGH (ref 0.0–100.0)

## 2017-05-29 LAB — TROPONIN I: Troponin I: 0.06 ng/mL (ref <0.03)

## 2017-05-29 MED ORDER — SODIUM CHLORIDE 0.9 % IV SOLN
1.0000 g | Freq: Once | INTRAVENOUS | Status: AC
  Administered 2017-05-30: 1 g via INTRAVENOUS
  Filled 2017-05-29: qty 10

## 2017-05-29 MED ORDER — SODIUM POLYSTYRENE SULFONATE 15 GM/60ML PO SUSP
15.0000 g | Freq: Once | ORAL | Status: AC
  Administered 2017-05-30: 15 g via ORAL
  Filled 2017-05-29: qty 60

## 2017-05-29 MED ORDER — LOSARTAN POTASSIUM 50 MG PO TABS: 50.0000 mg | ORAL_TABLET | Freq: Every day | ORAL | 0 refills | Status: DC

## 2017-05-29 MED ORDER — DEXTROSE 50 % IV SOLN
1.0000 | Freq: Once | INTRAVENOUS | Status: AC
  Administered 2017-05-30: 50 mL via INTRAVENOUS
  Filled 2017-05-29: qty 50

## 2017-05-29 MED ORDER — FUROSEMIDE 10 MG/ML IJ SOLN
60.0000 mg | Freq: Once | INTRAMUSCULAR | Status: AC
  Administered 2017-05-29: 60 mg via INTRAVENOUS
  Filled 2017-05-29: qty 8

## 2017-05-29 MED ORDER — INSULIN ASPART 100 UNIT/ML ~~LOC~~ SOLN
10.0000 [IU] | Freq: Once | SUBCUTANEOUS | Status: AC
  Administered 2017-05-30: 10 [IU] via INTRAVENOUS
  Filled 2017-05-29: qty 1

## 2017-05-29 NOTE — ED Notes (Signed)
Patient transported to Ultrasound 

## 2017-05-29 NOTE — ED Triage Notes (Addendum)
Patient ambulatory to triage with steady gait, without difficulty or distress noted; pt reports swelling to LE(s)/abd and sensation of difficulty swallowing with [facility]; hosp recently for CHF; pt taken immed to room 12 by EDT, Scott to be placed on card monitor for EKG; report called to care nurse Carollee Herter, RN

## 2017-05-29 NOTE — Telephone Encounter (Signed)
Please call Ms Mancine back. She would like to talk with you  Thank you  [phone]

## 2017-05-29 NOTE — ED Provider Notes (Signed)
[facility] Emergency Department Provider Note  ____________________________________________  Time seen: Approximately 10:16 PM  I have reviewed the triage vital signs and the nursing notes.   HISTORY  Chief Complaint Leg Swelling    HPI Kelly Leonard is a 61 y.o. male with a history of dilated cardiomyopathy and chronic systolic CHF presenting with lower extremity swelling and exertional shortness of breath. The patient reports that over the last several days, he has had progressively worsening lower extremity edema that is worse at the end of the day. In addition, he has noted exertional shortness of breath. He has not expressed any chest pressure, tightness or pain, palpitations, lightheadedness or syncope. He has not had any calf pain, long travel, and he has no history of blood clots. His primarycardiologist is Dr. Mariah Milling.   Past Medical History:  Diagnosis Date  . Anxiety   . CHF (congestive heart failure) (HCC)   . Chronic systolic congestive heart failure (HCC)   . CKD (chronic kidney disease) stage 4, GFR 15-29 ml/min (HCC)   . COPD (chronic obstructive pulmonary disease) (HCC)   . Dilated cardiomyopathy (HCC)   . Hyperlipidemia   . Hypertension   . MI (myocardial infarction) (HCC)   . Polycystic kidney   . Renal disorder     Patient Active Problem List   Diagnosis Date Noted  . Acute CHF (congestive heart failure) (HCC) 05/12/2017  . Insomnia 05/05/2017  . Advanced care planning/counseling discussion 03/24/2017  . 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 Hypertension   . Hyperlipidemia   . Chronic systolic congestive heart failure (HCC)   . Dilated cardiomyopathy (HCC)   . Polycystic kidney   . CKD (chronic kidney disease) stage 4, GFR 15-29 ml/min (HCC)   . 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 Anxiety disorder, unspecified 03/03/2013    Past Surgical History:  Procedure Laterality Date   . CARDIAC CATHETERIZATION    . IMPLANTABLE CARDIOVERTER DEFIBRILLATOR (ICD) GENERATOR CHANGE Right 06/23/2015   Procedure: ICD GENERATOR  INITIAL IMPLANT;  Surgeon: Sharion Settler, MD;  Location: [facility] ORS;  Service: Cardiovascular;  Laterality: Right;    Current Outpatient Rx  . Order #: [id number] Class: Normal  . Order #: [id number] Class: Historical Med  . Order #: [id number] Class: Normal  . Order #: [id number] Class: Normal  . Order #: [id number] Class: No Print  . Order #: [id number] Class: Historical Med    Allergies Entresto [sacubitril-valsartan]; Penicillins; Ace inhibitors; Carvedilol; Lorazepam; and Torsemide  Family History  Problem Relation Age of Onset  . Diabetes Mother   . Hypertension Brother   . Heart disease Maternal Grandmother     Social History Social History  Substance Use Topics  . Smoking status: Never Smoker  . Smokeless tobacco: Never Used  . Alcohol use No     Comment: on occasion    Review of Systems Constitutional: No fever/chills.No lightheadedness or syncope. Eyes: No visual changes. ENT: No sore throat. No congestion or rhinorrhea. Cardiovascular: Denies chest pain. Denies palpitations. Respiratory: Positive exertional shortness of breath.  No cough. Gastrointestinal: No abdominal pain.  No nausea, no vomiting.  No diarrhea.  No constipation. Genitourinary: Negative for dysuria. Musculoskeletal: Negative for back pain. Positive bilateral lower extremity swelling without Pain. Skin: Negative for rash. Neurological: Negative for headaches. No focal numbness, tingling or weakness.     ____________________________________________   PHYSICAL EXAM:  VITAL SIGNS: ED Triage Vitals [05/29/17 2130]  Enc Vitals Group     BP (!) 130/92  Pulse Rate (!) 102     Resp 18     Temp 97.7 F (36.5 C)     Temp Source Oral     SpO2 100 %     Weight 156 lb 15.5 oz (71.2 kg)     Height      Head Circumference      Peak Flow      Pain Score       Pain Loc      Pain Edu?      Excl. in GC?     Constitutional: Alert and oriented. Chronically ill appearing but in no acute distress. Answers questions appropriately. Eyes: Conjunctivae are normal.  EOMI. No scleral icterus. Head: Atraumatic. Nose: No congestion/rhinnorhea. Mouth/Throat: Mucous membranes are moist.  Neck: No stridor.  Supple.  No JVD. Cardiovascular: Normal rate, regular rhythm. No murmurs, rubs or gallops.  Respiratory: Normal respiratory effort.  No accessory muscle use or retractions. Lungs CTAB.  No wheezes, rales or ronchi. Gastrointestinal: Soft, nontender and nondistended.  No guarding or rebound.  No peritoneal signs. Musculoskeletal: Bilateral pitting LE edema to the knees. No ttp in the calves or palpable cords.  Negative Homan's sign. Neurologic:  A&Ox3.  Speech is clear.  Face and smile are symmetric.  EOMI.  Moves all extremities well. Skin:  Skin is warm, dry and intact. No rash noted. Psychiatric: Mood and affect are normal. Speech and behavior are normal.  Normal judgement.  ____________________________________________   LABS (all labs ordered are listed, but only abnormal results are displayed)  Labs Reviewed  CBC - Abnormal; Notable for the following:       Result Value   Hemoglobin 12.0 (*)    HCT 36.2 (*)    MCV 78.1 (*)    MCH 25.9 (*)    RDW 15.1 (*)    All other components within normal limits  BASIC METABOLIC PANEL - Abnormal; Notable for the following:    Sodium 134 (*)    Potassium 5.4 (*)    Glucose, Bld 110 (*)    BUN 71 (*)    Creatinine, Ser 2.93 (*)    Calcium 8.7 (*)    GFR calc non Af Amer 22 (*)    GFR calc Af Amer 25 (*)    All other components within normal limits  TROPONIN I - Abnormal; Notable for the following:    Troponin I 0.06 (*)    All other components within normal limits  BRAIN NATRIURETIC PEPTIDE - Abnormal; Notable for the following:    B Natriuretic Peptide 3,608.0 (*)    All other components within  normal limits   ____________________________________________  EKG  ED ECG REPORT I, Rockne Menghini, the attending physician, personally viewed and interpreted this ECG.   Date: 05/29/2017  EKG Time: 2137  Rate: 97  Rhythm: normal sinus rhythm  Axis: leftward  Intervals:first-degree A-V block   ST&T Change: Isolated 0.5 mm ST depression isolated to P6. No STEMI.  This EKG is compared to 05/12/17 and is grossly unchanged in morphology.  ____________________________________________  RADIOLOGY  Dg Chest Portable 1 View  Result Date: 05/29/2017 CLINICAL DATA:  Shortness of breath EXAM: PORTABLE CHEST 1 VIEW COMPARISON:  05/12/2017 FINDINGS: Mild cardiomegaly without CHF, pneumonia, collapse or consolidation. Negative for edema, effusion or pneumothorax. Left subclavian defibrillator noted. Trachea is midline. No osseous abnormality. IMPRESSION: Mild cardiomegaly without acute process. Electronically Signed   By: Judie Petit.  Shick M.D.   On: 05/29/2017 22:07    ____________________________________________  PROCEDURES  Procedure(s) performed: None  Procedures  Critical Care performed: Yes ____________________________________________   INITIAL IMPRESSION / ASSESSMENT AND PLAN / ED COURSE  Pertinent labs & imaging results that were available during my care of the patient were reviewed by me and considered in my medical decision making (see chart for details).  61 y.o. male with a history of CHF and dilated cardiomyopathy presenting with bilateral lower extremity swelling and exertional shortness breath. Overall, the patient is hemodynamically stable. His lungs are clear my exam and his chest x-ray does not show any edema. We'll do an ambulatory pulse ox for further evaluation. We will ultrasound his legs, but it is likely that his edema is from his CHF. I will treat him with a dose of IV Lasix. ACS or MI is very unlikely. Plan reevaluation for final  disposition.  ----------------------------------------- 11:32 PM on 05/29/2017 -----------------------------------------  The patient's O2 sats did drop to 90% with ambulation. He has a markedly elevated BNP. I am concerned that with his lower extremity edema and exertional shortness of breath and hypoxia that he has a CHF exacerbation I will require inpatient diuresis. In addition, he does have an elevated troponin. He has not been having any chest pain, so this is likely due to to demand ischemia, but will need to be trended. The patient will be admitted to the hospital for further evaluation and treatment.  ____________________________________________  FINAL CLINICAL IMPRESSION(S) / ED DIAGNOSES  Final diagnoses:  Hyperkalemia  Elevated troponin  Acute on chronic systolic congestive heart failure (HCC)  Hypoxia         NEW MEDICATIONS STARTED DURING THIS VISIT:  New Prescriptions   No medications on file      Rockne Menghini, MD 05/29/17 2334

## 2017-05-29 NOTE — Telephone Encounter (Signed)
Patient notified

## 2017-05-29 NOTE — Telephone Encounter (Signed)
Spoke with patient's wife regarding patient's facial edema that developed after beginning the entresto. He did not take any further entresto and took a claritin yesterday as well. She says that patient no longer has any facial swelling. Is planning on resuming his losartan today.   Advised patient's wife that I would add the entresto to patient's allergy list

## 2017-05-29 NOTE — ED Notes (Addendum)
Pt ambulated without any reports of increased SOB but oxygen saturation dropped to 90% on RA.

## 2017-05-29 NOTE — Telephone Encounter (Signed)
If he's having worsening symptoms or severe SOB he needs to go ahead and go to UC or ER, otherwise I would defer to what the heart failure clinic advises.

## 2017-05-29 NOTE — Telephone Encounter (Signed)
Patient called back and said he's having the swelling again in his face. He said he is having some issues with his breathing. He talked to the heart failure clinic yesterday and today at lunch and at that time his swelling was improved. Now it's come back and he tried to call them back but couldn't get anyone. He wants to know what he should do.

## 2017-05-30 ENCOUNTER — Ambulatory Visit: Payer: BLUE CROSS/BLUE SHIELD | Admitting: Unknown Physician Specialty

## 2017-05-30 ENCOUNTER — Ambulatory Visit: Payer: BLUE CROSS/BLUE SHIELD

## 2017-05-30 DIAGNOSIS — I130 Hypertensive heart and chronic kidney disease with heart failure and stage 1 through stage 4 chronic kidney disease, or unspecified chronic kidney disease: Secondary | ICD-10-CM | POA: Diagnosis present

## 2017-05-30 DIAGNOSIS — F419 Anxiety disorder, unspecified: Secondary | ICD-10-CM | POA: Diagnosis present

## 2017-05-30 DIAGNOSIS — Z8249 Family history of ischemic heart disease and other diseases of the circulatory system: Secondary | ICD-10-CM | POA: Diagnosis not present

## 2017-05-30 DIAGNOSIS — I252 Old myocardial infarction: Secondary | ICD-10-CM | POA: Diagnosis not present

## 2017-05-30 DIAGNOSIS — E875 Hyperkalemia: Secondary | ICD-10-CM | POA: Diagnosis present

## 2017-05-30 DIAGNOSIS — Z880 Allergy status to penicillin: Secondary | ICD-10-CM | POA: Diagnosis not present

## 2017-05-30 DIAGNOSIS — I420 Dilated cardiomyopathy: Secondary | ICD-10-CM | POA: Diagnosis present

## 2017-05-30 DIAGNOSIS — Z79899 Other long term (current) drug therapy: Secondary | ICD-10-CM | POA: Diagnosis not present

## 2017-05-30 DIAGNOSIS — Z9889 Other specified postprocedural states: Secondary | ICD-10-CM | POA: Diagnosis not present

## 2017-05-30 DIAGNOSIS — Z888 Allergy status to other drugs, medicaments and biological substances status: Secondary | ICD-10-CM | POA: Diagnosis not present

## 2017-05-30 DIAGNOSIS — I5023 Acute on chronic systolic (congestive) heart failure: Secondary | ICD-10-CM | POA: Diagnosis present

## 2017-05-30 DIAGNOSIS — Z7982 Long term (current) use of aspirin: Secondary | ICD-10-CM | POA: Diagnosis not present

## 2017-05-30 DIAGNOSIS — N184 Chronic kidney disease, stage 4 (severe): Secondary | ICD-10-CM | POA: Diagnosis present

## 2017-05-30 DIAGNOSIS — J449 Chronic obstructive pulmonary disease, unspecified: Secondary | ICD-10-CM | POA: Diagnosis present

## 2017-05-30 DIAGNOSIS — Z833 Family history of diabetes mellitus: Secondary | ICD-10-CM | POA: Diagnosis not present

## 2017-05-30 DIAGNOSIS — Z95810 Presence of automatic (implantable) cardiac defibrillator: Secondary | ICD-10-CM | POA: Diagnosis not present

## 2017-05-30 LAB — BASIC METABOLIC PANEL
Anion gap: 13 (ref 5–15)
BUN: 71 mg/dL — AB (ref 6–20)
CHLORIDE: 101 mmol/L (ref 101–111)
CO2: 25 mmol/L (ref 22–32)
Calcium: 8.9 mg/dL (ref 8.9–10.3)
Creatinine, Ser: 2.8 mg/dL — ABNORMAL HIGH (ref 0.61–1.24)
GFR, EST AFRICAN AMERICAN: 27 mL/min — AB (ref >60)
GFR, EST NON AFRICAN AMERICAN: 23 mL/min — AB (ref >60)
Glucose, Bld: 76 mg/dL (ref 65–99)
POTASSIUM: 4.1 mmol/L (ref 3.5–5.1)
SODIUM: 139 mmol/L (ref 135–145)

## 2017-05-30 LAB — CBC
HEMATOCRIT: 38.3 % — AB (ref 40.0–52.0)
Hemoglobin: 12.4 g/dL — ABNORMAL LOW (ref 13.0–18.0)
MCH: 26 pg (ref 26.0–34.0)
MCHC: 32.4 g/dL (ref 32.0–36.0)
MCV: 80.1 fL (ref 80.0–100.0)
PLATELETS: 250 10*3/uL (ref 150–440)
RBC: 4.78 MIL/uL (ref 4.40–5.90)
RDW: 15 % — AB (ref 11.5–14.5)
WBC: 13.5 10*3/uL — AB (ref 3.8–10.6)

## 2017-05-30 MED ORDER — ONDANSETRON HCL 4 MG PO TABS: 4.0000 mg | ORAL_TABLET | Freq: Four times a day (QID) | ORAL | Status: DC | PRN

## 2017-05-30 MED ORDER — FUROSEMIDE 10 MG/ML IJ SOLN
40.0000 mg | Freq: Once | INTRAMUSCULAR | Status: AC
  Administered 2017-05-30: 40 mg via INTRAVENOUS
  Filled 2017-05-30: qty 4

## 2017-05-30 MED ORDER — FUROSEMIDE 40 MG PO TABS
120.0000 mg | ORAL_TABLET | Freq: Every day | ORAL | Status: DC
  Administered 2017-05-30 – 2017-05-31 (×2): 120 mg via ORAL
  Filled 2017-05-30 (×2): qty 3

## 2017-05-30 MED ORDER — HYDROXYZINE HCL 25 MG PO TABS
25.0000 mg | ORAL_TABLET | Freq: Once | ORAL | Status: AC
  Administered 2017-05-30: 25 mg via ORAL
  Filled 2017-05-30: qty 1

## 2017-05-30 MED ORDER — LOSARTAN POTASSIUM 50 MG PO TABS
50.0000 mg | ORAL_TABLET | Freq: Every day | ORAL | Status: DC
  Administered 2017-05-30 – 2017-05-31 (×2): 50 mg via ORAL
  Filled 2017-05-30 (×2): qty 1

## 2017-05-30 MED ORDER — SODIUM CHLORIDE 0.9% FLUSH
3.0000 mL | Freq: Two times a day (BID) | INTRAVENOUS | Status: DC
  Administered 2017-05-30: 3 mL via INTRAVENOUS

## 2017-05-30 MED ORDER — ALUM & MAG HYDROXIDE-SIMETH 200-200-20 MG/5ML PO SUSP
30.0000 mL | Freq: Four times a day (QID) | ORAL | Status: DC | PRN
  Administered 2017-05-30: 30 mL via ORAL

## 2017-05-30 MED ORDER — ONDANSETRON HCL 4 MG/2ML IJ SOLN
4.0000 mg | Freq: Four times a day (QID) | INTRAMUSCULAR | Status: DC | PRN
  Administered 2017-05-30 (×2): 4 mg via INTRAVENOUS
  Filled 2017-05-30 (×2): qty 2

## 2017-05-30 MED ORDER — ACETAMINOPHEN 650 MG RE SUPP: 650.0000 mg | Freq: Four times a day (QID) | RECTAL | Status: DC | PRN

## 2017-05-30 MED ORDER — HEPARIN SODIUM (PORCINE) 5000 UNIT/ML IJ SOLN
5000.0000 [IU] | Freq: Three times a day (TID) | INTRAMUSCULAR | Status: DC
  Administered 2017-05-30 – 2017-05-31 (×4): 5000 [IU] via SUBCUTANEOUS
  Filled 2017-05-30 (×4): qty 1

## 2017-05-30 MED ORDER — HYDROXYZINE HCL 10 MG PO TABS
10.0000 mg | ORAL_TABLET | Freq: Three times a day (TID) | ORAL | Status: DC | PRN
Start: 2017-05-30 — End: 2017-05-31
  Administered 2017-05-30 (×2): 10 mg via ORAL
  Filled 2017-05-30 (×6): qty 1

## 2017-05-30 MED ORDER — ASPIRIN EC 81 MG PO TBEC
81.0000 mg | DELAYED_RELEASE_TABLET | Freq: Every day | ORAL | Status: DC
  Administered 2017-05-30 – 2017-05-31 (×2): 81 mg via ORAL
  Filled 2017-05-30 (×2): qty 1

## 2017-05-30 MED ORDER — ACETAMINOPHEN 325 MG PO TABS: 650.0000 mg | ORAL_TABLET | Freq: Four times a day (QID) | ORAL | Status: DC | PRN

## 2017-05-30 NOTE — ED Notes (Signed)
Pharmacy emailed to send calcium gluconate

## 2017-05-30 NOTE — Progress Notes (Signed)
[facility] [place] at [facility]   PATIENT NAME: Kelly Leonard    MR#:  [id number]  DATE OF BIRTH:  Jul 04, 1956  SUBJECTIVE:  CHIEF COMPLAINT:   Chief Complaint  Patient presents with  . Leg Swelling    Came with SOB, leg swelling. Feels better after one Inj lasix. Still have significant edema on legs.  REVIEW OF SYSTEMS:  CONSTITUTIONAL: No fever, fatigue or weakness.  EYES: No blurred or double vision.  EARS, NOSE, AND THROAT: No tinnitus or ear pain.  RESPIRATORY: No cough, shortness of breath, wheezing or hemoptysis.  CARDIOVASCULAR: No chest pain, orthopnea, have edema.  GASTROINTESTINAL: No nausea, vomiting, diarrhea or abdominal pain.  GENITOURINARY: No dysuria, hematuria.  ENDOCRINE: No polyuria, nocturia,  HEMATOLOGY: No anemia, easy bruising or bleeding SKIN: No rash or lesion. MUSCULOSKELETAL: No joint pain or arthritis.   NEUROLOGIC: No tingling, numbness, weakness.  PSYCHIATRY: No anxiety or depression.   ROS  DRUG ALLERGIES:   Allergies  Allergen Reactions  . Entresto [Sacubitril-Valsartan] Swelling    Swelling of the face  . Penicillins Anaphylaxis and Swelling    Has patient had a PCN reaction causing immediate rash, facial/tongue/throat swelling, SOB or lightheadedness with hypotension: Yes Has patient had a PCN reaction causing severe rash involving mucus membranes or skin necrosis: No Has patient had a PCN reaction that required hospitalization: No Has patient had a PCN reaction occurring within the last 10 years: No If all of the above answers are "NO", then may proceed with Cephalosporin use.   . Ace Inhibitors Rash  . Carvedilol Other (See Comments)    "dizziness, light headed, and nausea"  . Lorazepam Rash    Rash and severe anxiety  . Torsemide Hives, Itching, Nausea And Vomiting and Rash    VITALS:  Blood pressure 134/75, pulse (!) 105, temperature 98.1 F (36.7 C), temperature source Oral, resp. rate 18, height 5'  9" (1.753 m), weight 69.4 kg (153 lb), SpO2 90 %.  PHYSICAL EXAMINATION:  GENERAL:  61 y.o.-year-old patient lying in the bed with no acute distress.  EYES: Pupils equal, round, reactive to light and accommodation. No scleral icterus. Extraocular muscles intact.  HEENT: Head atraumatic, normocephalic. Oropharynx and nasopharynx clear.  NECK:  Supple, no jugular venous distention. No thyroid enlargement, no tenderness.  LUNGS: Normal breath sounds bilaterally, no wheezing, b/l crepitation. No use of accessory muscles of respiration.  CARDIOVASCULAR: S1, S2 normal. No murmurs, rubs, or gallops.  ABDOMEN: Soft, nontender, nondistended. Bowel sounds present. No organomegaly or mass.  EXTREMITIES:  b/l pedal edema, no cyanosis, or clubbing.  NEUROLOGIC: Cranial nerves II through XII are intact. Muscle strength 5/5 in all extremities. Sensation intact. Gait not checked.  PSYCHIATRIC: The patient is alert and oriented x 3.  SKIN: No obvious rash, lesion, or ulcer.   Physical Exam LABORATORY PANEL:   CBC  Recent Labs Lab 05/30/17 0600  WBC 13.5*  HGB 12.4*  HCT 38.3*  PLT 250   ------------------------------------------------------------------------------------------------------------------  Chemistries   Recent Labs Lab 05/30/17 0600  NA 139  K 4.1  CL 101  CO2 25  GLUCOSE 76  BUN 71*  CREATININE 2.80*  CALCIUM 8.9   ------------------------------------------------------------------------------------------------------------------  Cardiac Enzymes  Recent Labs Lab 05/29/17 2152  TROPONINI 0.06*   ------------------------------------------------------------------------------------------------------------------  RADIOLOGY:  US Venous Img Lower Bilateral  Result Date: 05/29/2017 CLINICAL DATA:  Initial evaluation for acute bilateral lower extremity swelling. EXAM: BILATERAL LOWER EXTREMITY VENOUS DOPPLER ULTRASOUND TECHNIQUE: Gray-scale sonography with graded  compression,  as well as color Doppler and duplex ultrasound were performed to evaluate the lower extremity deep venous systems from the level of the common femoral vein and including the common femoral, femoral, profunda femoral, popliteal and calf veins including the posterior tibial, peroneal and gastrocnemius veins when visible. The superficial great saphenous vein was also interrogated. Spectral Doppler was utilized to evaluate flow at rest and with distal augmentation maneuvers in the common femoral, femoral and popliteal veins. COMPARISON:  None. FINDINGS: RIGHT LOWER EXTREMITY Common Femoral Vein: No evidence of thrombus. Normal compressibility, respiratory phasicity and response to augmentation. Saphenofemoral Junction: No evidence of thrombus. Normal compressibility and flow on color Doppler imaging. Profunda Femoral Vein: No evidence of thrombus. Normal compressibility and flow on color Doppler imaging. Femoral Vein: No evidence of thrombus. Normal compressibility, respiratory phasicity and response to augmentation. Popliteal Vein: No evidence of thrombus. Normal compressibility, respiratory phasicity and response to augmentation. Calf Veins: No evidence of thrombus. Normal compressibility and flow on color Doppler imaging.Peroneal vein not visualized. Superficial Great Saphenous Vein: No evidence of thrombus. Normal compressibility and flow on color Doppler imaging. Venous Reflux:  None. Other Findings: Diffuse soft tissue edema present within the soft tissues of the right calf. LEFT LOWER EXTREMITY Common Femoral Vein: No evidence of thrombus. Normal compressibility, respiratory phasicity and response to augmentation. Saphenofemoral Junction: No evidence of thrombus. Normal compressibility and flow on color Doppler imaging. Profunda Femoral Vein: No evidence of thrombus. Normal compressibility and flow on color Doppler imaging. Femoral Vein: No evidence of thrombus. Normal compressibility, respiratory  phasicity and response to augmentation. Popliteal Vein: No evidence of thrombus. Normal compressibility, respiratory phasicity and response to augmentation. Calf Veins: No evidence of thrombus. Normal compressibility and flow on color Doppler imaging. Peroneal vein not visualized. Superficial Great Saphenous Vein: No evidence of thrombus. Normal compressibility and flow on color Doppler imaging. Venous Reflux:  None. Other Findings: Diffuse soft tissue edema noted within the soft tissues of the left calf. IMPRESSION: 1. No evidence of DVT within either lower extremity. 2. Diffuse soft tissue edema present within the soft tissues of the bilateral calves. Electronically Signed   By: Rise Mu M.D.   On: 05/29/2017 23:48   Dg Chest Portable 1 View  Result Date: 05/29/2017 CLINICAL DATA:  Shortness of breath EXAM: PORTABLE CHEST 1 VIEW COMPARISON:  05/12/2017 FINDINGS: Mild cardiomegaly without CHF, pneumonia, collapse or consolidation. Negative for edema, effusion or pneumothorax. Left subclavian defibrillator noted. Trachea is midline. No osseous abnormality. IMPRESSION: Mild cardiomegaly without acute process. Electronically Signed   By: Judie Petit.  Shick M.D.   On: 05/29/2017 22:07    ASSESSMENT AND PLAN:   Principal Problem:   Acute on chronic systolic CHF (congestive heart failure) (HCC) Active Problems:   Hypertension   CKD (chronic kidney disease) stage 4, GFR 15-29 ml/min (HCC)   Anxiety disorder, unspecified  * Acute on chronic systolic CHF (congestive heart failure) (HCC) - IV Lasix given, cardiology consult awaited.  Monitor Intake and Output.  *  Hypertension - continue home meds *  CKD (chronic kidney disease) stage 4, GFR 15-29 ml/min (HCC) - stable, monitor closely, avoid nephrotoxins *  Anxiety disorder, unspecified - anxiolytic ( Atarax) when necessary  * Hyperkalemia    Resolved now. After kayexalate.  All the records are reviewed and case discussed with Care  Management/Social Workerr. Management plans discussed with the patient, family and they are in agreement.  CODE STATUS: Full.  TOTAL TIME TAKING CARE OF THIS PATIENT: 35 minutes.  Discussed with his wife in room,   POSSIBLE D/C IN 1-2 DAYS, DEPENDING ON CLINICAL CONDITION.   Altamese Dilling M.D on 05/30/2017   Between 7am to 6pm - Pager - [phone]  After 6pm go to [email or web address] - password [email or web address]  [facility]  Office  [phone]  CC: Primary care physician; Gabriel Cirri, NP  Note: This dictation was prepared with Dragon dictation along with smaller phrase technology. Any transcriptional errors that result from this process are unintentional.

## 2017-05-30 NOTE — Care Management (Signed)
Patient readmitted within 30 days for congestive heart failure which is a chronic diagnosis for patient.  He required oxygen support in the ED for a room air sat of 85% but is currently on room air.  CV navigator identified several areas of concerns with disease management- large amount of fluid intake and high sodium intake.  Patient and his wife had education reinforced.  Patient compliant with [facility] Failure [facility] appointments.  Entresto had to be discontinued due to facial swelling 9/19. Requested home 02 assessment prior to discharge

## 2017-05-30 NOTE — Progress Notes (Signed)
Kelly Leonard, Kelly Leonard 61 y/o . Patient was transferred from the ER following c/o SOB, weight gain and bilateral lower extremities edema. On admission patient was AA&O X4, denied being in pain, ambulatory and accompanied by significant other. Patient was oriented to his room, and admission documentation completed with patient. Patient has no acute event overnight,NSR with stable VS.

## 2017-05-30 NOTE — H&P (Signed)
[facility] - [place] at [facility]   PATIENT NAME: Kelly Leonard    MR#:  [id number]  DATE OF BIRTH:  03-10-1956  DATE OF ADMISSION:  05/29/2017  PRIMARY CARE PHYSICIAN: Gabriel Cirri, NP   REQUESTING/REFERRING PHYSICIAN: Sharma Covert, MD  CHIEF COMPLAINT:   Chief Complaint  Patient presents with  . Leg Swelling    HISTORY OF PRESENT ILLNESS:  Kelly Leonard  is a 61 y.o. male who presents with Bilateral lower extremity edema and shortness of breath. Patient states this is been progressing for the past several days. He was recently admitted in the hospital for heart failure.  Workup in the ED today indicates exacerbation of his heart failure. He is known to have significantly decreased EF, 20-25 percent. Hospitalists were called for admission  PAST MEDICAL HISTORY:   Past Medical History:  Diagnosis Date  . Anxiety   . CHF (congestive heart failure) (HCC)   . Chronic systolic congestive heart failure (HCC)   . CKD (chronic kidney disease) stage 4, GFR 15-29 ml/min (HCC)   . COPD (chronic obstructive pulmonary disease) (HCC)   . Dilated cardiomyopathy (HCC)   . Hyperlipidemia   . Hypertension   . MI (myocardial infarction) (HCC)   . Polycystic kidney   . Renal disorder     PAST SURGICAL HISTORY:   Past Surgical History:  Procedure Laterality Date  . CARDIAC CATHETERIZATION    . IMPLANTABLE CARDIOVERTER DEFIBRILLATOR (ICD) GENERATOR CHANGE Right 06/23/2015   Procedure: ICD GENERATOR  INITIAL IMPLANT;  Surgeon: Sharion Settler, MD;  Location: [facility] ORS;  Service: Cardiovascular;  Laterality: Right;    SOCIAL HISTORY:   Social History  Substance Use Topics  . Smoking status: Never Smoker  . Smokeless tobacco: Never Used  . Alcohol use No     Comment: on occasion    FAMILY HISTORY:   Family History  Problem Relation Age of Onset  . Diabetes Mother   . Hypertension Brother   . Heart disease Maternal Grandmother     DRUG ALLERGIES:    Allergies  Allergen Reactions  . Entresto [Sacubitril-Valsartan] Swelling    Swelling of the face  . Penicillins Anaphylaxis and Swelling    Has patient had a PCN reaction causing immediate rash, facial/tongue/throat swelling, SOB or lightheadedness with hypotension: Yes Has patient had a PCN reaction causing severe rash involving mucus membranes or skin necrosis: No Has patient had a PCN reaction that required hospitalization: No Has patient had a PCN reaction occurring within the last 10 years: No If all of the above answers are "NO", then may proceed with Cephalosporin use.   . Ace Inhibitors Rash  . Carvedilol Other (See Comments)    "dizziness, light headed, and nausea"  . Lorazepam Rash    Rash and severe anxiety  . Torsemide Hives, Itching, Nausea And Vomiting and Rash    MEDICATIONS AT HOME:   Prior to Admission medications   Medication Sig Start Date End Date Taking? Authorizing Provider  aspirin EC 81 MG tablet Take 81 mg by mouth daily.   Yes [provider]  furosemide (LASIX) 40 MG tablet Take 1 tablet (40 mg total) by mouth 3 (three) times daily. Patient taking differently: Take 120 mg by mouth daily.  05/14/17 05/14/18 Yes Katha Hamming, MD  losartan (COZAAR) 50 MG tablet Take 1 tablet (50 mg total) by mouth daily. 05/29/17 08/27/17 Yes [name] A, FNP  triamcinolone cream (KENALOG) 0.1 % Apply 1 application topically 2 (two) times  daily as needed (rash).  05/08/17  Yes [provider]    REVIEW OF SYSTEMS:  Review of Systems  Constitutional: Negative for chills, fever, malaise/fatigue and weight loss.  HENT: Negative for ear pain, hearing loss and tinnitus.   Eyes: Negative for blurred vision, double vision, pain and redness.  Respiratory: Positive for shortness of breath. Negative for cough and hemoptysis.   Cardiovascular: Positive for leg swelling. Negative for chest pain, palpitations and orthopnea.  Gastrointestinal: Negative for  abdominal pain, constipation, diarrhea, nausea and vomiting.  Genitourinary: Negative for dysuria, frequency and hematuria.  Musculoskeletal: Negative for back pain, joint pain and neck pain.  Skin:       No acne, rash, or lesions  Neurological: Negative for dizziness, tremors, focal weakness and weakness.  Endo/Heme/Allergies: Negative for polydipsia. Does not bruise/bleed easily.  Psychiatric/Behavioral: Negative for depression. The patient is not nervous/anxious and does not have insomnia.      VITAL SIGNS:   Vitals:   05/29/17 2130 05/29/17 2200 05/29/17 2230  BP: (!) 130/92 (!) 128/98 (!) 114/98  Pulse: (!) 102  92  Resp: Temp: 97.7 F (36.5 C)    TempSrc: Oral    SpO2: 100%  100%  Weight: 71.2 kg (156 lb 15.5 oz)     Wt Readings from Last 3 Encounters:  05/29/17 71.2 kg (156 lb 15.5 oz)  05/26/17 68.3 kg (150 lb 8 oz)  05/22/17 63.9 kg (140 lb 12.8 oz)    PHYSICAL EXAMINATION:  Physical Exam  Vitals reviewed. Constitutional: He is oriented to person, place, and time. He appears well-developed and well-nourished. No distress.  HENT:  Head: Normocephalic and atraumatic.  Mouth/Throat: Oropharynx is clear and moist.  Eyes: Pupils are equal, round, and reactive to light. Conjunctivae and EOM are normal. No scleral icterus.  Neck: Normal range of motion. Neck supple. No JVD present. No thyromegaly present.  Cardiovascular: Normal rate, regular rhythm and intact distal pulses.  Exam reveals no gallop and no friction rub.   No murmur heard. Respiratory: Effort normal. No respiratory distress. He has no wheezes. He has rales.  GI: Soft. Bowel sounds are normal. He exhibits no distension. There is no tenderness.  Musculoskeletal: Normal range of motion. He exhibits edema.  No arthritis, no gout  Lymphadenopathy:    He has no cervical adenopathy.  Neurological: He is alert and oriented to person, place, and time. No cranial nerve deficit.  No dysarthria, no  aphasia  Skin: Skin is warm and dry. No rash noted. No erythema.  Psychiatric: He has a normal mood and affect. His behavior is normal. Judgment and thought content normal.    LABORATORY PANEL:   CBC  Recent Labs Lab 05/29/17 2152  WBC 9.4  HGB 12.0*  HCT 36.2*  PLT 274   ------------------------------------------------------------------------------------------------------------------  Chemistries   Recent Labs Lab 05/29/17 2152  NA 134*  K 5.4*  CL 102  CO2 24  GLUCOSE 110*  BUN 71*  CREATININE 2.93*  CALCIUM 8.7*   ------------------------------------------------------------------------------------------------------------------  Cardiac Enzymes  Recent Labs Lab 05/29/17 2152  TROPONINI 0.06*   ------------------------------------------------------------------------------------------------------------------  RADIOLOGY:  US Venous Img Lower Bilateral  Result Date: 05/29/2017 CLINICAL DATA:  Initial evaluation for acute bilateral lower extremity swelling. EXAM: BILATERAL LOWER EXTREMITY VENOUS DOPPLER ULTRASOUND TECHNIQUE: Gray-scale sonography with graded compression, as well as color Doppler and duplex ultrasound were performed to evaluate the lower extremity deep venous systems from the level of the common femoral vein and including the  common femoral, femoral, profunda femoral, popliteal and calf veins including the posterior tibial, peroneal and gastrocnemius veins when visible. The superficial great saphenous vein was also interrogated. Spectral Doppler was utilized to evaluate flow at rest and with distal augmentation maneuvers in the common femoral, femoral and popliteal veins. COMPARISON:  None. FINDINGS: RIGHT LOWER EXTREMITY Common Femoral Vein: No evidence of thrombus. Normal compressibility, respiratory phasicity and response to augmentation. Saphenofemoral Junction: No evidence of thrombus. Normal compressibility and flow on color Doppler imaging. Profunda  Femoral Vein: No evidence of thrombus. Normal compressibility and flow on color Doppler imaging. Femoral Vein: No evidence of thrombus. Normal compressibility, respiratory phasicity and response to augmentation. Popliteal Vein: No evidence of thrombus. Normal compressibility, respiratory phasicity and response to augmentation. Calf Veins: No evidence of thrombus. Normal compressibility and flow on color Doppler imaging.Peroneal vein not visualized. Superficial Great Saphenous Vein: No evidence of thrombus. Normal compressibility and flow on color Doppler imaging. Venous Reflux:  None. Other Findings: Diffuse soft tissue edema present within the soft tissues of the right calf. LEFT LOWER EXTREMITY Common Femoral Vein: No evidence of thrombus. Normal compressibility, respiratory phasicity and response to augmentation. Saphenofemoral Junction: No evidence of thrombus. Normal compressibility and flow on color Doppler imaging. Profunda Femoral Vein: No evidence of thrombus. Normal compressibility and flow on color Doppler imaging. Femoral Vein: No evidence of thrombus. Normal compressibility, respiratory phasicity and response to augmentation. Popliteal Vein: No evidence of thrombus. Normal compressibility, respiratory phasicity and response to augmentation. Calf Veins: No evidence of thrombus. Normal compressibility and flow on color Doppler imaging. Peroneal vein not visualized. Superficial Great Saphenous Vein: No evidence of thrombus. Normal compressibility and flow on color Doppler imaging. Venous Reflux:  None. Other Findings: Diffuse soft tissue edema noted within the soft tissues of the left calf. IMPRESSION: 1. No evidence of DVT within either lower extremity. 2. Diffuse soft tissue edema present within the soft tissues of the bilateral calves. Electronically Signed   By: Rise Mu M.D.   On: 05/29/2017 23:48   Dg Chest Portable 1 View  Result Date: 05/29/2017 CLINICAL DATA:  Shortness of breath  EXAM: PORTABLE CHEST 1 VIEW COMPARISON:  05/12/2017 FINDINGS: Mild cardiomegaly without CHF, pneumonia, collapse or consolidation. Negative for edema, effusion or pneumothorax. Left subclavian defibrillator noted. Trachea is midline. No osseous abnormality. IMPRESSION: Mild cardiomegaly without acute process. Electronically Signed   By: Judie Petit.  Shick M.D.   On: 05/29/2017 22:07    EKG:   Orders placed or performed during the hospital encounter of 05/29/17  . ED EKG  . ED EKG  . EKG 12-Lead  . EKG 12-Lead    IMPRESSION AND PLAN:  Principal Problem:   Acute on chronic systolic CHF (congestive heart failure) (HCC) - IV Lasix given, cardiology consult ordered Active Problems:   Hypertension - continue home meds   CKD (chronic kidney disease) stage 4, GFR 15-29 ml/min (HCC) - stable, monitor closely, avoid nephrotoxins   Anxiety disorder, unspecified - anxiolytic when necessary  All the records are reviewed and case discussed with ED provider. Management plans discussed with the patient and/or family.  DVT PROPHYLAXIS: SubQ heparin  GI PROPHYLAXIS: None  ADMISSION STATUS: Inpatient  CODE STATUS: Full Code Status History    Date Active Date Inactive Code Status Order ID Comments User Context   05/12/2017  6:09 PM 05/14/2017  4:31 PM Full Code [id number]  Katha Hamming, MD ED   06/23/2015  3:08 PM 06/24/2015  4:48 PM Full Code [id number]  Maisie Fus,  Anna Genre, MD Inpatient      TOTAL TIME TAKING CARE OF THIS PATIENT: 45 minutes.   Adelaida Reindel FIELDING 05/30/2017, 12:18 AM  [facility]  Office  [phone]  CC: Primary care physician; Gabriel Cirri, NP  Note:  This document was prepared using Dragon voice recognition software and may include unintentional dictation errors.

## 2017-05-30 NOTE — Progress Notes (Signed)
Rounded on patient.  Patient admitted on 05/29/2017 with dx of CHF.  Patient has CKD, Stage IV.  Patient was seen in the [facility] on 05/26/2017.  Patient was started on Entresto.  He subsequently developed an allergic reaction to [facility] His last admission was on 05/12/2017 with dx of CHF.  Patient stated his swelling has decreased since being admitted, but he is still having LE edema.  Patient stated he and his wife just realized today that he was drinking too much water at home in addition to coffee and juice.   Dr. Elisabeth Pigeon instructed patient on how much total fluids he should be drinking per day.  Patient and wife stated again that patient has been drinking too many fluids.    Patient has been weighing himself daily and assessing his symptoms.  Wife is helping patient keep a chart of weights, BP, Zones, medications.  Patient tried to work some this week, but was sent home yesterday due to lower extremity edema.  Read note from [facility] were NP indicated patient was eating about 2100 mg of sodium per day.  Reviewed sodium intake with patient and wife.  Patient and wife stated he does not use salt.  They to like to eat at a [employer] resturant, as they indicated the food is healthy there.  I again cautioned about the sodium.  Wife stated they have found that food is healthier if prepared at home.  Dietitian Consultation entered for further low sodium heart healthy diet education.   Patient and wife have joined a gym and are working out together. Patient enjoys swimming at the gym.  Patient is hoping to get his CHF under control so he can return to work and work on a regular basis.  Informed patient and wife there would be a follow-up appointment scheduled for patient within approximately 7 days of discharge.  Patient and wife verbalized understanding.    Army Melia, RN, BSH, [facility] Pulmonary Nurse Navigator

## 2017-05-30 NOTE — Progress Notes (Signed)
Nutrition Education Note  RD consulted for nutrition education regarding CHF.  RD provided "Low Sodium Nutrition Therapy" handout from the Academy of [employer] and Dietetics. Reviewed patient's dietary recall. Provided examples on ways to decrease sodium intake in diet. Discouraged intake of processed foods and use of salt shaker. Encouraged fresh fruits and vegetables as well as whole grain sources of carbohydrates to maximize fiber intake.   RD discussed why it is important for patient to adhere to diet recommendations, and emphasized the role of fluids, foods to avoid, and importance of weighing self daily. Teach back method used.  Expect good compliance.  Body mass index is 22.59 kg/m. Pt meets criteria for normal weight based on current BMI.  Current diet order is HH , patient is consuming approximately 100%% of meals at this time. Labs and medications reviewed. No further nutrition interventions warranted at this time. RD contact information provided. If additional nutrition issues arise, please re-consult RD.   Betsey Holiday MS, RD, LDN Pager #- [phone] After Hours Pager: [phone]

## 2017-05-31 LAB — BASIC METABOLIC PANEL
Anion gap: 10 (ref 5–15)
BUN: 78 mg/dL — AB (ref 6–20)
CO2: 29 mmol/L (ref 22–32)
CREATININE: 3.12 mg/dL — AB (ref 0.61–1.24)
Calcium: 9.1 mg/dL (ref 8.9–10.3)
Chloride: 100 mmol/L — ABNORMAL LOW (ref 101–111)
GFR calc Af Amer: 23 mL/min — ABNORMAL LOW (ref >60)
GFR, EST NON AFRICAN AMERICAN: 20 mL/min — AB (ref >60)
Glucose, Bld: 118 mg/dL — ABNORMAL HIGH (ref 65–99)
Potassium: 4.8 mmol/L (ref 3.5–5.1)
SODIUM: 139 mmol/L (ref 135–145)

## 2017-05-31 MED ORDER — PHENOL 1.4 % MT LIQD
1.0000 | OROMUCOSAL | Status: DC | PRN
  Administered 2017-05-31: 1 via OROMUCOSAL
  Filled 2017-05-31: qty 177

## 2017-05-31 MED ORDER — HYDROXYZINE HCL 25 MG PO TABS: 25.0000 mg | ORAL_TABLET | Freq: Every evening | ORAL | 0 refills | Status: DC | PRN

## 2017-05-31 NOTE — Plan of Care (Signed)
Problem: Fluid Volume: Goal: Ability to maintain a balanced intake and output will improve Outcome: Progressing Continues to diurese.  Breathing without distress on room air.  Episode of nausea and vomiting relieved with prn med.  Sore throat reported after nausea/wretching episode.  Chloraseptic spray given.

## 2017-05-31 NOTE — Progress Notes (Signed)
[facility]         [place], [place].   05/31/2017  Patient: Kelly Leonard   Date of Birth:  1955/10/12  Date of admission:  05/29/2017  Date of Discharge  05/31/2017    To Whom it May Concern:   [name]  may return to work on 06/02/17.  PHYSICAL ACTIVITY:  Full  If you have any questions or concerns, please don't hesitate to call.  Sincerely,   Altamese Dilling M.D Pager [phone] Office : [phone]   .

## 2017-05-31 NOTE — Progress Notes (Signed)
Pt to be discharged today. Has been ambulating at length in hallway and tolerating it well. Iv and tele removed. disch instructions and prescrip and work excuse given to pt. disch to home accompanied by wife.

## 2017-06-02 ENCOUNTER — Ambulatory Visit (INDEPENDENT_AMBULATORY_CARE_PROVIDER_SITE_OTHER): Payer: BLUE CROSS/BLUE SHIELD | Admitting: Family Medicine

## 2017-06-02 ENCOUNTER — Encounter: Payer: Self-pay | Admitting: Family

## 2017-06-02 ENCOUNTER — Ambulatory Visit: Payer: BLUE CROSS/BLUE SHIELD | Attending: Family | Admitting: Family

## 2017-06-02 ENCOUNTER — Encounter: Payer: Self-pay | Admitting: Family Medicine

## 2017-06-02 VITALS — BP 120/76 | HR 92 | Resp 18 | Ht 69.0 in | Wt 153.1 lb

## 2017-06-02 VITALS — BP 137/88 | HR 91 | Temp 97.8°F | Wt 155.1 lb

## 2017-06-02 DIAGNOSIS — Z79899 Other long term (current) drug therapy: Secondary | ICD-10-CM | POA: Insufficient documentation

## 2017-06-02 DIAGNOSIS — Z880 Allergy status to penicillin: Secondary | ICD-10-CM | POA: Diagnosis not present

## 2017-06-02 DIAGNOSIS — I509 Heart failure, unspecified: Secondary | ICD-10-CM | POA: Diagnosis present

## 2017-06-02 DIAGNOSIS — F419 Anxiety disorder, unspecified: Secondary | ICD-10-CM | POA: Diagnosis not present

## 2017-06-02 DIAGNOSIS — I252 Old myocardial infarction: Secondary | ICD-10-CM | POA: Insufficient documentation

## 2017-06-02 DIAGNOSIS — Z888 Allergy status to other drugs, medicaments and biological substances status: Secondary | ICD-10-CM | POA: Insufficient documentation

## 2017-06-02 DIAGNOSIS — Q613 Polycystic kidney, unspecified: Secondary | ICD-10-CM | POA: Insufficient documentation

## 2017-06-02 DIAGNOSIS — I420 Dilated cardiomyopathy: Secondary | ICD-10-CM | POA: Insufficient documentation

## 2017-06-02 DIAGNOSIS — Z8249 Family history of ischemic heart disease and other diseases of the circulatory system: Secondary | ICD-10-CM | POA: Insufficient documentation

## 2017-06-02 DIAGNOSIS — N184 Chronic kidney disease, stage 4 (severe): Secondary | ICD-10-CM | POA: Diagnosis not present

## 2017-06-02 DIAGNOSIS — Z95810 Presence of automatic (implantable) cardiac defibrillator: Secondary | ICD-10-CM | POA: Insufficient documentation

## 2017-06-02 DIAGNOSIS — E785 Hyperlipidemia, unspecified: Secondary | ICD-10-CM | POA: Diagnosis not present

## 2017-06-02 DIAGNOSIS — F5101 Primary insomnia: Secondary | ICD-10-CM

## 2017-06-02 DIAGNOSIS — Z7982 Long term (current) use of aspirin: Secondary | ICD-10-CM | POA: Diagnosis not present

## 2017-06-02 DIAGNOSIS — I5022 Chronic systolic (congestive) heart failure: Secondary | ICD-10-CM

## 2017-06-02 DIAGNOSIS — I10 Essential (primary) hypertension: Secondary | ICD-10-CM

## 2017-06-02 DIAGNOSIS — I130 Hypertensive heart and chronic kidney disease with heart failure and stage 1 through stage 4 chronic kidney disease, or unspecified chronic kidney disease: Secondary | ICD-10-CM | POA: Diagnosis not present

## 2017-06-02 DIAGNOSIS — Z833 Family history of diabetes mellitus: Secondary | ICD-10-CM | POA: Insufficient documentation

## 2017-06-02 DIAGNOSIS — J449 Chronic obstructive pulmonary disease, unspecified: Secondary | ICD-10-CM | POA: Insufficient documentation

## 2017-06-02 MED ORDER — QUETIAPINE FUMARATE 50 MG PO TABS: ORAL_TABLET | ORAL | 1 refills | Status: DC

## 2017-06-02 NOTE — Patient Instructions (Addendum)
Continue weighing daily and call for an overnight weight gain of > 2 pounds or a weekly weight gain of >5 pounds.  Add an extra 40mg  furosemide in the mornings. Split the remaining 120mg  doses up during the day.  Begin wearing compression socks daily with removal at bedtime.

## 2017-06-02 NOTE — Assessment & Plan Note (Signed)
Followed closely by HF clinic. Lasix increased today to 160 mg daily divided in 2-4 doses.

## 2017-06-02 NOTE — Progress Notes (Signed)
BP 137/88 (BP Location: Right Arm, Patient Position: Sitting, Cuff Size: Normal)   Pulse 91   Temp 97.8 F (36.6 C)   Wt 155 lb 1.6 oz (70.4 kg)   SpO2 100%   BMI 22.90 kg/m    Subjective:    Patient ID: Kelly Leonard, male    DOB: November 15, 1955, 61 y.o.   MRN: [id number]  HPI: Kelly Leonard is a 61 y.o. male  Chief Complaint  Patient presents with  . Insomnia  . Anxiety   Patient presents for f/u on his insomnia and acute anxiety. Was given hydroxyzine in the ER recently which worked at the time but has not helped since d/c. Had allergic rxn to BZDs recently as well. Gabapentin helped for a period of time but stopped after a few days. Pt still pacing through the night, seems only to fall asleep for short bursts, typically when riding in the car.   Relevant past medical, surgical, family and social history reviewed and updated as indicated. Interim medical history since our last visit reviewed. Allergies and medications reviewed and updated.  Review of Systems  Constitutional: Negative.   HENT: Negative.   Respiratory: Negative.   Cardiovascular: Negative.   Gastrointestinal: Negative.   Genitourinary: Negative.   Musculoskeletal: Negative.   Neurological: Negative.   Psychiatric/Behavioral: Positive for sleep disturbance. The patient is nervous/anxious and is hyperactive.    Per HPI unless specifically indicated above     Objective:    BP 137/88 (BP Location: Right Arm, Patient Position: Sitting, Cuff Size: Normal)   Pulse 91   Temp 97.8 F (36.6 C)   Wt 155 lb 1.6 oz (70.4 kg)   SpO2 100%   BMI 22.90 kg/m   Wt Readings from Last 3 Encounters:  06/02/17 155 lb 1.6 oz (70.4 kg)  06/02/17 153 lb 2 oz (69.5 kg)  05/31/17 150 lb (68 kg)    Physical Exam  Constitutional: He appears well-developed and well-nourished. No distress.  HENT:  Head: Atraumatic.  Eyes: Pupils are equal, round, and reactive to light. Conjunctivae are normal. No scleral icterus.    Neck: Normal range of motion. Neck supple.  Cardiovascular: Normal rate and normal heart sounds.   Pulmonary/Chest: Effort normal and breath sounds normal. No respiratory distress.  Musculoskeletal: Normal range of motion.  Neurological: He is alert.  Skin: Skin is warm and dry.  Psychiatric:  Pt jittery, pacing, visibly frustrated, then falling asleep in chair  Nursing note and vitals reviewed.  Results for orders placed or performed during the hospital encounter of 05/29/17  CBC  Result Value Ref Range   WBC 9.4 3.8 - 10.6 K/uL   RBC 4.64 4.40 - 5.90 MIL/uL   Hemoglobin 12.0 (L) 13.0 - 18.0 g/dL   HCT 78.9 (L) 38.1 - 01.7 %   MCV 78.1 (L) 80.0 - 100.0 fL   MCH 25.9 (L) 26.0 - 34.0 pg   MCHC 33.1 32.0 - 36.0 g/dL   RDW 51.0 (H) 25.8 - 52.7 %   Platelets 274 150 - 440 K/uL  Basic metabolic panel  Result Value Ref Range   Sodium 134 (L) 135 - 145 mmol/L   Potassium 5.4 (H) 3.5 - 5.1 mmol/L   Chloride 102 101 - 111 mmol/L   CO2 24 22 - 32 mmol/L   Glucose, Bld 110 (H) 65 - 99 mg/dL   BUN 71 (H) 6 - 20 mg/dL   Creatinine, Ser 7.82 (H) 0.61 - 1.24 mg/dL   Calcium 8.7 (L)  8.9 - 10.3 mg/dL   GFR calc non Af Amer 22 (L) >60 mL/min   GFR calc Af Amer 25 (L) >60 mL/min   Anion gap 8 5 - 15  Troponin I  Result Value Ref Range   Troponin I 0.06 (HH) <0.03 ng/mL  Brain natriuretic peptide  Result Value Ref Range   B Natriuretic Peptide 3,608.0 (H) 0.0 - 100.0 pg/mL  Basic metabolic panel  Result Value Ref Range   Sodium 139 135 - 145 mmol/L   Potassium 4.1 3.5 - 5.1 mmol/L   Chloride 101 101 - 111 mmol/L   CO2 25 22 - 32 mmol/L   Glucose, Bld 76 65 - 99 mg/dL   BUN 71 (H) 6 - 20 mg/dL   Creatinine, Ser 8.11 (H) 0.61 - 1.24 mg/dL   Calcium 8.9 8.9 - 91.4 mg/dL   GFR calc non Af Amer 23 (L) >60 mL/min   GFR calc Af Amer 27 (L) >60 mL/min   Anion gap 13 5 - 15  CBC  Result Value Ref Range   WBC 13.5 (H) 3.8 - 10.6 K/uL   RBC 4.78 4.40 - 5.90 MIL/uL   Hemoglobin 12.4 (L)  13.0 - 18.0 g/dL   HCT 78.2 (L) 95.6 - 21.3 %   MCV 80.1 80.0 - 100.0 fL   MCH 26.0 26.0 - 34.0 pg   MCHC 32.4 32.0 - 36.0 g/dL   RDW 08.6 (H) 57.8 - 46.9 %   Platelets 250 150 - 440 K/uL  Basic metabolic panel  Result Value Ref Range   Sodium 139 135 - 145 mmol/L   Potassium 4.8 3.5 - 5.1 mmol/L   Chloride 100 (L) 101 - 111 mmol/L   CO2 29 22 - 32 mmol/L   Glucose, Bld 118 (H) 65 - 99 mg/dL   BUN 78 (H) 6 - 20 mg/dL   Creatinine, Ser 6.29 (H) 0.61 - 1.24 mg/dL   Calcium 9.1 8.9 - 52.8 mg/dL   GFR calc non Af Amer 20 (L) >60 mL/min   GFR calc Af Amer 23 (L) >60 mL/min   Anion gap 10 5 - 15      Assessment & Plan:   Problem List Items Addressed This Visit      Cardiovascular and Mediastinum   Chronic systolic congestive heart failure (HCC)    Followed closely by HF clinic. Lasix increased today to 160 mg daily divided in 2-4 doses.         Other   Anxiety disorder, unspecified - Primary    Allergy to BZDs, failed hydroxyzine and multiple other calming medications. Will try some seroquel. Risks and benefits reviewed. Continue essential oils and home remedies.      Insomnia    Hoping seroquel will help, follow up in the next 2-4 weeks to discuss benefits and dosing.           Follow up plan: Return in about 4 weeks (around 06/30/2017) for Anxiety, insomnia.

## 2017-06-02 NOTE — Progress Notes (Signed)
Patient ID: Kelly Leonard, male    DOB: 1956-09-03, 61 y.o.   MRN: [id number]  HPI  Kelly Leonard is a 61 year old male with a past medical history consisting of anxiety, CKDIII, COPD, HLD, HTN, hx of MI and chronic heart failure (EF 20-25%).   Most recent echo done on 05/13/17 shows LV EF 20-25% along with moderate MR/TR and moderately elevated PA pressure of 55 mmHg.   Admitted 05/29/17 due to HF exacerbation. Initially given IV diuretics and then transitioned back to oral diuretics. Discharged home after 2 days. Admitted 05/12/17 due to HF exacerbation. Initially needed IV lasix and then transitioned to oral lasix. Lost ~3L of fluid while admitted. Cardiology consult obtained. Discharged home after 2 days. Was in the ED 05/02/17 due to HF exacerbation. He was treated and released. Was in the ED 03/15/17 due to HF exacerbation. He was treated and released.   Patient presents for a follow-up visit with a chief complaint of anxiety. He describes this as being present for several months but he feels like it's been worsening over the last several weeks. He has associated fatigue, shortness of breath, edema, difficulty sleeping and weakness associated with this. He denies any chest pain, dizziness or suicidal thoughts. Has an appointment with his PCP later today.   Past Medical History:  Diagnosis Date  . Anxiety   . CHF (congestive heart failure) (HCC)   . Chronic systolic congestive heart failure (HCC)   . CKD (chronic kidney disease) stage 4, GFR 15-29 ml/min (HCC)   . COPD (chronic obstructive pulmonary disease) (HCC)   . Dilated cardiomyopathy (HCC)   . Hyperlipidemia   . Hypertension   . MI (myocardial infarction) (HCC)   . Polycystic kidney   . Renal disorder    Past Surgical History:  Procedure Laterality Date  . CARDIAC CATHETERIZATION    . IMPLANTABLE CARDIOVERTER DEFIBRILLATOR (ICD) GENERATOR CHANGE Right 06/23/2015   Procedure: ICD GENERATOR  INITIAL IMPLANT;  Surgeon: Sharion Settler, MD;  Location: [facility] ORS;  Service: Cardiovascular;  Laterality: Right;   Family History  Problem Relation Age of Onset  . Diabetes Mother   . Hypertension Brother   . Heart disease Maternal Grandmother    Social History  Substance Use Topics  . Smoking status: Never Smoker  . Smokeless tobacco: Never Used  . Alcohol use No     Comment: on occasion   Allergies  Allergen Reactions  . Entresto [Sacubitril-Valsartan] Swelling    Swelling of the face  . Penicillins Anaphylaxis and Swelling    Has patient had a PCN reaction causing immediate rash, facial/tongue/throat swelling, SOB or lightheadedness with hypotension: Yes Has patient had a PCN reaction causing severe rash involving mucus membranes or skin necrosis: No Has patient had a PCN reaction that required hospitalization: No Has patient had a PCN reaction occurring within the last 10 years: No If all of the above answers are "NO", then may proceed with Cephalosporin use.   . Ace Inhibitors Rash  . Carvedilol Other (See Comments)    "dizziness, light headed, and nausea"  . Lorazepam Rash    Rash and severe anxiety  . Torsemide Hives, Itching, Nausea And Vomiting and Rash   Prior to Admission medications   Medication Sig Start Date End Date Taking? Authorizing Provider  aspirin EC 81 MG tablet Take 81 mg by mouth daily.   Yes [provider]  furosemide (LASIX) 40 MG tablet Take 120 mg by mouth daily.  Yes [provider]  hydrOXYzine (ATARAX/VISTARIL) 25 MG tablet Take 1 tablet (25 mg total) by mouth at bedtime as needed for anxiety (sleep). Patient not taking: Reported on 06/02/2017 05/31/17  Yes Altamese Dilling, MD  losartan (COZAAR) 50 MG tablet Take 1 tablet (50 mg total) by mouth daily. 05/29/17 08/27/17 Yes Delma Freeze, FNP     Review of Systems  Constitutional: Positive for fatigue. Negative for appetite change.  HENT: Negative for congestion, postnasal drip and sore throat.    Eyes: Negative.   Respiratory: Positive for shortness of breath. Negative for chest tightness.   Cardiovascular: Positive for leg swelling. Negative for chest pain and palpitations.  Gastrointestinal: Positive for abdominal distention. Negative for abdominal pain.  Endocrine: Negative.   Genitourinary: Negative.   Musculoskeletal: Negative for back pain and neck pain.  Skin: Negative.   Allergic/Immunologic: Negative.   Neurological: Positive for weakness. Negative for dizziness and light-headedness.  Hematological: Negative for adenopathy. Does not bruise/bleed easily.  Psychiatric/Behavioral: Positive for sleep disturbance (due to anxiety). Negative for dysphoric mood and suicidal ideas. The patient is nervous/anxious.    Vitals:   06/02/17 1208  BP: 120/76  Pulse: 92  Resp: 18  SpO2: 100%  Weight: 153 lb 2 oz (69.5 kg)  Height: 5\' 9"  (1.753 m)    Lab Results  Component Value Date   CREATININE 3.12 (H) 05/31/2017   CREATININE 2.80 (H) 05/30/2017   CREATININE 2.93 (H) 05/29/2017   Physical Exam  Constitutional: He is oriented to person, place, and time. He appears well-developed and well-nourished.  HENT:  Head: Normocephalic and atraumatic.  Neck: Normal range of motion. Neck supple. No JVD present.  Cardiovascular: Normal rate and regular rhythm.   Pulmonary/Chest: Effort normal. He has no wheezes. He has no rales.  Abdominal: Soft. He exhibits no distension. There is no tenderness.  Musculoskeletal: He exhibits edema (3+ pitting edema in bilateral lower legs). He exhibits no tenderness.  Neurological: He is alert and oriented to person, place, and time.  Skin: Skin is warm and dry.  Psychiatric: His behavior is normal. Thought content normal. His mood appears anxious.  Nursing note and vitals reviewed.   Assessment and Plan   1. Chronic Heart Failure with Reduced Ejection Fraction  - NYHA class II - continued fluid overload today - Weighing daily; Reminded him  to call for an overnight weight gain of >2 pounds or a weekly weight gain of >5 pounds; by our scale, his weight is up 3 pounds from his last visit here - Still using salt on some foods but trying to stay under 2000mg  sodium/day;   - add an additional 40mg  furosemide daily and split the remaining 120mg  dose up and not take it all at one time. Would be better to take 80mg  twice daily - BMP from 05/31/17 reviewed which shows sodium 139, potassium 4.8 and GFR 23 - unable to tolerate entresto as he developed facial swelling - Instructed to elevate legs and to get compression socks on .  - May need to consider metolazone or spironolactone in the future  - follows with nephrologist Thedore Mins)  2. Hypertension  - BP good today  - Checks BP daily at home - Continue current medications   3: Anxiety- - patient feels like this is the root of his shortness of breath - feels like when he's most anxious is when he becomes most short of breath. Having difficulty sleeping due to anxiety and he ends up walking around the neighborhood  at night for up to a couple of hours - has an appointment with his PCP later today to discuss this further   Patient did not bring his medications nor a list. Each medication was verbally reviewed with the patient and he was encouraged to bring the bottles to every visit to confirm accuracy of list.  Return in 2 weeks or sooner for any questions/problems before then. Will get a BMP at that time.

## 2017-06-03 NOTE — Discharge Summary (Signed)
[facility] [place] [facility]   PATIENT NAME: Kelly Leonard    MR#:  [id number]  DATE OF BIRTH:  [birth year]  DATE OF ADMISSION:  05/29/2017 ADMITTING PHYSICIAN: Oralia Manis, MD  DATE OF DISCHARGE: 05/31/2017 11:47 AM  PRIMARY CARE PHYSICIAN: Gabriel Cirri, NP    ADMISSION DIAGNOSIS:  Hyperkalemia [E87.5] Hypoxia [R09.02] Elevated troponin [R74.8] Acute on chronic systolic congestive heart failure (HCC) [I50.23]  DISCHARGE DIAGNOSIS:  Principal Problem:   Acute on chronic systolic CHF (congestive heart failure) (HCC) Active Problems:   Hypertension   CKD (chronic kidney disease) stage 4, GFR 15-29 ml/min (HCC)   Anxiety disorder, unspecified   SECONDARY DIAGNOSIS:   Past Medical History:  Diagnosis Date  . Anxiety   . CHF (congestive heart failure) (HCC)   . Chronic systolic congestive heart failure (HCC)   . CKD (chronic kidney disease) stage 4, GFR 15-29 ml/min (HCC)   . COPD (chronic obstructive pulmonary disease) (HCC)   . Dilated cardiomyopathy (HCC)   . Hyperlipidemia   . Hypertension   . MI (myocardial infarction) (HCC)   . Polycystic kidney   . Renal disorder     HOSPITAL COURSE:   * Acute on chronic systolic CHF (congestive heart failure) (HCC) - IV Lasix given, cardiology consult awaited.  Monitor Intake and Output.   Improved much after > 2 ltr diuresis, Councelled about fluid restriction.  *Hypertension - continue home meds *CKD (chronic kidney disease) stage 4, GFR 15-29 ml/min (HCC) - stable, monitor closely, avoid nephrotoxins *Anxiety disorder, unspecified - anxiolytic ( Atarax) when necessary  * Hyperkalemia    Resolved now. After kayexalate.  DISCHARGE CONDITIONS:   Stable.  CONSULTS OBTAINED:  Treatment Team:  Lamar Blinks, MD  DRUG ALLERGIES:   Allergies  Allergen Reactions  . Entresto [Sacubitril-Valsartan] Swelling    Swelling of the face  . Penicillins Anaphylaxis and Swelling     Has patient had a PCN reaction causing immediate rash, facial/tongue/throat swelling, SOB or lightheadedness with hypotension: Yes Has patient had a PCN reaction causing severe rash involving mucus membranes or skin necrosis: No Has patient had a PCN reaction that required hospitalization: No Has patient had a PCN reaction occurring within the last 10 years: No If all of the above answers are "NO", then may proceed with Cephalosporin use.   . Ace Inhibitors Rash  . Carvedilol Other (See Comments)    "dizziness, light headed, and nausea"  . Lorazepam Rash    Rash and severe anxiety  . Torsemide Hives, Itching, Nausea And Vomiting and Rash    DISCHARGE MEDICATIONS:   Discharge Medication List as of 05/31/2017 11:11 AM    START taking these medications   Details  hydrOXYzine (ATARAX/VISTARIL) 25 MG tablet Take 1 tablet (25 mg total) by mouth at bedtime as needed for anxiety (sleep)., Starting Sat 05/31/2017, Print      CONTINUE these medications which have NOT CHANGED   Details  aspirin EC 81 MG tablet Take 81 mg by mouth daily., Historical Med    losartan (COZAAR) 50 MG tablet Take 1 tablet (50 mg total) by mouth daily., Starting Thu 05/29/2017, Until Wed 08/27/2017, No Print    furosemide (LASIX) 40 MG tablet Take 1 tablet (40 mg total) by mouth 3 (three) times daily., Starting Wed 05/14/2017, Until Thu 05/14/2018, Normal      STOP taking these medications     triamcinolone cream (KENALOG) 0.1 %          DISCHARGE  INSTRUCTIONS:    Follow with cardio clinic in 1-2 weeks.  If you experience worsening of your admission symptoms, develop shortness of breath, life threatening emergency, suicidal or homicidal thoughts you must seek medical attention immediately by calling 911 or calling your MD immediately  if symptoms less severe.  You Must read complete instructions/literature along with all the possible adverse reactions/side effects for all the Medicines you take and that  have been prescribed to you. Take any new Medicines after you have completely understood and accept all the possible adverse reactions/side effects.   Please note  You were cared for by a hospitalist during your hospital stay. If you have any questions about your discharge medications or the care you received while you were in the hospital after you are discharged, you can call the unit and asked to speak with the hospitalist on call if the hospitalist that took care of you is not available. Once you are discharged, your primary care physician will handle any further medical issues. Please note that NO REFILLS for any discharge medications will be authorized once you are discharged, as it is imperative that you return to your primary care physician (or establish a relationship with a primary care physician if you do not have one) for your aftercare needs so that they can reassess your need for medications and monitor your lab values.    Today   CHIEF COMPLAINT:   Chief Complaint  Patient presents with  . Leg Swelling    HISTORY OF PRESENT ILLNESS:  Kelly Leonard  is a 61 y.o. male presents with Bilateral lower extremity edema and shortness of breath. Patient states this is been progressing for the past several days. He was recently admitted in the hospital for heart failure.  Workup in the ED today indicates exacerbation of his heart failure. He is known to have significantly decreased EF, 20-25 percent. Hospitalists were called for admission    VITAL SIGNS:  Blood pressure 121/69, pulse 94, temperature (!) 97.5 F (36.4 C), temperature source Oral, resp. rate 16, height 5\' 9"  (1.753 m), weight 68 kg (150 lb), SpO2 94 %.  I/O:  No intake or output data in the 24 hours ending 06/03/17 0912  PHYSICAL EXAMINATION:   GENERAL:  61 y.o.-year-old patient lying in the bed with no acute distress.  EYES: Pupils equal, round, reactive to light and accommodation. No scleral icterus. Extraocular  muscles intact.  HEENT: Head atraumatic, normocephalic. Oropharynx and nasopharynx clear.  NECK:  Supple, no jugular venous distention. No thyroid enlargement, no tenderness.  LUNGS: Normal breath sounds bilaterally, no wheezing, b/l crepitation. No use of accessory muscles of respiration.  CARDIOVASCULAR: S1, S2 normal. No murmurs, rubs, or gallops.  ABDOMEN: Soft, nontender, nondistended. Bowel sounds present. No organomegaly or mass.  EXTREMITIES:  b/l pedal edema, no cyanosis, or clubbing.  NEUROLOGIC: Cranial nerves II through XII are intact. Muscle strength 5/5 in all extremities. Sensation intact. Gait not checked.  PSYCHIATRIC: The patient is alert and oriented x 3.  SKIN: No obvious rash, lesion, or ulcer.   DATA REVIEW:   CBC  Recent Labs Lab 05/30/17 0600  WBC 13.5*  HGB 12.4*  HCT 38.3*  PLT 250    Chemistries   Recent Labs Lab 05/31/17 0547  NA 139  K 4.8  CL 100*  CO2 29  GLUCOSE 118*  BUN 78*  CREATININE 3.12*  CALCIUM 9.1    Cardiac Enzymes  Recent Labs Lab 05/29/17 2152  TROPONINI 0.06*  Microbiology Results  Results for orders placed or performed during the hospital encounter of 06/12/15  Surgical pcr screen     Status: None   Collection Time: 06/12/15  9:06 AM  Result Value Ref Range Status   MRSA, PCR NEGATIVE NEGATIVE Final   Staphylococcus aureus NEGATIVE NEGATIVE Final    Comment:        The Xpert SA Assay (FDA approved for NASAL specimens in patients over 39 years of age), is one component of a comprehensive surveillance program.  Test performance has been validated by [facility] for patients greater than or equal to 50 year old. It is not intended to diagnose infection nor to guide or monitor treatment.     RADIOLOGY:  No results found.  EKG:   Orders placed or performed during the hospital encounter of 05/29/17  . ED EKG  . ED EKG  . EKG 12-Lead  . EKG 12-Lead      Management plans discussed with the  patient, family and they are in agreement.  CODE STATUS:  Code Status History    Date Active Date Inactive Code Status Order ID Comments User Context   05/30/2017  1:44 AM 05/31/2017  2:52 PM Full Code [id number]  Oralia Manis, MD Inpatient   05/12/2017  6:09 PM 05/14/2017  4:31 PM Full Code [id number]  Katha Hamming, MD ED   06/23/2015  3:08 PM 06/24/2015  4:48 PM Full Code [id number]  Sharion Settler, MD Inpatient      TOTAL TIME TAKING CARE OF THIS PATIENT: 35 minutes.    Altamese Dilling M.D on 06/03/2017 at 9:12 AM  Between 7am to 6pm - Pager - [phone]  After 6pm go to [email or web address] - [email or web address]  [facility]  Office  [phone]  CC: Primary care physician; Gabriel Cirri, NP   Note: This dictation was prepared with Dragon dictation along with smaller phrase technology. Any transcriptional errors that result from this process are unintentional.

## 2017-06-05 ENCOUNTER — Telehealth: Payer: Self-pay | Admitting: Gastroenterology

## 2017-06-05 ENCOUNTER — Emergency Department
Admission: EM | Admit: 2017-06-05 | Discharge: 2017-06-05 | Disposition: A | Discharge status: Home / Self Care | Payer: BLUE CROSS/BLUE SHIELD | Attending: Emergency Medicine | Admitting: Emergency Medicine

## 2017-06-05 ENCOUNTER — Encounter: Payer: Self-pay | Admitting: Emergency Medicine

## 2017-06-05 ENCOUNTER — Emergency Department: Payer: BLUE CROSS/BLUE SHIELD

## 2017-06-05 DIAGNOSIS — N184 Chronic kidney disease, stage 4 (severe): Secondary | ICD-10-CM | POA: Insufficient documentation

## 2017-06-05 DIAGNOSIS — E1122 Type 2 diabetes mellitus with diabetic chronic kidney disease: Secondary | ICD-10-CM | POA: Insufficient documentation

## 2017-06-05 DIAGNOSIS — I5023 Acute on chronic systolic (congestive) heart failure: Secondary | ICD-10-CM | POA: Diagnosis not present

## 2017-06-05 DIAGNOSIS — I509 Heart failure, unspecified: Secondary | ICD-10-CM

## 2017-06-05 DIAGNOSIS — Z79899 Other long term (current) drug therapy: Secondary | ICD-10-CM | POA: Diagnosis not present

## 2017-06-05 DIAGNOSIS — J449 Chronic obstructive pulmonary disease, unspecified: Secondary | ICD-10-CM | POA: Insufficient documentation

## 2017-06-05 DIAGNOSIS — Z7982 Long term (current) use of aspirin: Secondary | ICD-10-CM | POA: Diagnosis not present

## 2017-06-05 DIAGNOSIS — Z8673 Personal history of transient ischemic attack (TIA), and cerebral infarction without residual deficits: Secondary | ICD-10-CM | POA: Diagnosis not present

## 2017-06-05 DIAGNOSIS — I130 Hypertensive heart and chronic kidney disease with heart failure and stage 1 through stage 4 chronic kidney disease, or unspecified chronic kidney disease: Secondary | ICD-10-CM | POA: Insufficient documentation

## 2017-06-05 DIAGNOSIS — R609 Edema, unspecified: Secondary | ICD-10-CM

## 2017-06-05 DIAGNOSIS — R0602 Shortness of breath: Secondary | ICD-10-CM | POA: Diagnosis present

## 2017-06-05 LAB — BASIC METABOLIC PANEL
ANION GAP: 15 (ref 5–15)
BUN: 96 mg/dL — AB (ref 6–20)
CO2: 23 mmol/L (ref 22–32)
Calcium: 9.2 mg/dL (ref 8.9–10.3)
Chloride: 96 mmol/L — ABNORMAL LOW (ref 101–111)
Creatinine, Ser: 4.17 mg/dL — ABNORMAL HIGH (ref 0.61–1.24)
GFR, EST AFRICAN AMERICAN: 16 mL/min — AB (ref >60)
GFR, EST NON AFRICAN AMERICAN: 14 mL/min — AB (ref >60)
Glucose, Bld: 112 mg/dL — ABNORMAL HIGH (ref 65–99)
POTASSIUM: 5.9 mmol/L — AB (ref 3.5–5.1)
Sodium: 134 mmol/L — ABNORMAL LOW (ref 135–145)

## 2017-06-05 LAB — CBC
HEMATOCRIT: 37.2 % — AB (ref 40.0–52.0)
HEMOGLOBIN: 12 g/dL — AB (ref 13.0–18.0)
MCH: 25.4 pg — ABNORMAL LOW (ref 26.0–34.0)
MCHC: 32.1 g/dL (ref 32.0–36.0)
MCV: 79.1 fL — ABNORMAL LOW (ref 80.0–100.0)
Platelets: 273 10*3/uL (ref 150–440)
RBC: 4.7 MIL/uL (ref 4.40–5.90)
RDW: 15.4 % — AB (ref 11.5–14.5)
WBC: 7.2 10*3/uL (ref 3.8–10.6)

## 2017-06-05 LAB — BRAIN NATRIURETIC PEPTIDE: B NATRIURETIC PEPTIDE 5: 4465 pg/mL — AB (ref 0.0–100.0)

## 2017-06-05 LAB — TROPONIN I: Troponin I: 0.06 ng/mL (ref <0.03)

## 2017-06-05 MED ORDER — FUROSEMIDE 10 MG/ML IJ SOLN
80.0000 mg | Freq: Once | INTRAMUSCULAR | Status: AC
  Administered 2017-06-05: 80 mg via INTRAVENOUS

## 2017-06-05 MED ORDER — METOLAZONE 2.5 MG PO TABS: 2.5000 mg | ORAL_TABLET | Freq: Every day | ORAL | 11 refills | Status: DC

## 2017-06-05 MED ORDER — METOLAZONE 2.5 MG PO TABS
2.5000 mg | ORAL_TABLET | Freq: Once | ORAL | Status: AC
  Administered 2017-06-05: 2.5 mg via ORAL
  Filled 2017-06-05 (×2): qty 1

## 2017-06-05 MED ORDER — METOPROLOL SUCCINATE ER 25 MG PO TB24: 25.0000 mg | ORAL_TABLET | ORAL | 0 refills | Status: DC

## 2017-06-05 MED ORDER — METOPROLOL SUCCINATE ER 50 MG PO TB24
25.0000 mg | ORAL_TABLET | Freq: Every day | ORAL | Status: DC
  Administered 2017-06-05: 25 mg via ORAL
  Filled 2017-06-05: qty 1

## 2017-06-05 MED ORDER — FUROSEMIDE 10 MG/ML IJ SOLN
INTRAMUSCULAR | Status: AC
  Administered 2017-06-05: 80 mg via INTRAVENOUS
  Filled 2017-06-05: qty 8

## 2017-06-05 MED ORDER — LORAZEPAM 1 MG PO TABS
1.0000 mg | ORAL_TABLET | Freq: Once | ORAL | Status: AC
  Administered 2017-06-05: 1 mg via ORAL
  Filled 2017-06-05: qty 1

## 2017-06-05 NOTE — Assessment & Plan Note (Signed)
Hoping seroquel will help, follow up in the next 2-4 weeks to discuss benefits and dosing.

## 2017-06-05 NOTE — ED Notes (Signed)
Pt verbalizes d/c teaching, Rx and follow up. PT in NAD at time of d/c, VS stable, ambulatory.

## 2017-06-05 NOTE — Patient Instructions (Signed)
Follow up in 1 month for recheck.

## 2017-06-05 NOTE — Discharge Instructions (Signed)
YOUR NEW MEDICATION REGIMEN IS AS FOLLOWS:  Metoprolol 25mg  by mouth daily Metolazone 2.5mg  by mouth twice a week as needed for leg swelling Aspirin 81mg  by mouth daily Lasix 80mg  by mouth twice a day Cozaar 50mg  by mouth daily Seroquel 50mg  by mouth daily  Please keep your appointment to establish care with Dr. Mariah Milling in 5 days as scheduled.  Return to the emergency department sooner for any concerns whatsoever.  It was a pleasure to take care of you today, and thank you for coming to our emergency department.  If you have any questions or concerns before leaving please ask the nurse to grab me and I'm more than happy to go through your aftercare instructions again.  If you were prescribed any opioid pain medication today such as Norco, Vicodin, Percocet, morphine, hydrocodone, or oxycodone please make sure you do not drive when you are taking this medication as it can alter your ability to drive safely.  If you have any concerns once you are home that you are not improving or are in fact getting worse before you can make it to your follow-up appointment, please do not hesitate to call 911 and come back for further evaluation.  Merrily Brittle, MD  Results for orders placed or performed during the hospital encounter of 06/05/17  Basic metabolic panel  Result Value Ref Range   Sodium 134 (L) 135 - 145 mmol/L   Potassium 5.9 (H) 3.5 - 5.1 mmol/L   Chloride 96 (L) 101 - 111 mmol/L   CO2 23 22 - 32 mmol/L   Glucose, Bld 112 (H) 65 - 99 mg/dL   BUN 96 (H) 6 - 20 mg/dL   Creatinine, Ser 0.51 (H) 0.61 - 1.24 mg/dL   Calcium 9.2 8.9 - 10.2 mg/dL   GFR calc non Af Amer 14 (L) >60 mL/min   GFR calc Af Amer 16 (L) >60 mL/min   Anion gap 15 5 - 15  CBC  Result Value Ref Range   WBC 7.2 3.8 - 10.6 K/uL   RBC 4.70 4.40 - 5.90 MIL/uL   Hemoglobin 12.0 (L) 13.0 - 18.0 g/dL   HCT 11.1 (L) 73.5 - 67.0 %   MCV 79.1 (L) 80.0 - 100.0 fL   MCH 25.4 (L) 26.0 - 34.0 pg   MCHC 32.1 32.0 - 36.0 g/dL     RDW 14.1 (H) 03.0 - 14.5 %   Platelets 273 150 - 440 K/uL  Troponin I  Result Value Ref Range   Troponin I 0.06 (HH) <0.03 ng/mL   Dg Chest 2 View  Result Date: 06/05/2017 CLINICAL DATA:  Shortness of breath and bilateral leg swelling, initial encounter EXAM: CHEST  2 VIEW COMPARISON:  05/29/2017 FINDINGS: Cardiac shadow is enlarged. Defibrillator is again identified and stable. The lungs are well aerated bilaterally. No focal infiltrate or sizable effusion is seen. No acute bony abnormality is noted. IMPRESSION: Stable cardiomegaly without acute abnormality. Electronically Signed   By: Alcide Clever M.D.   On: 06/05/2017 16:07   Dg Chest 2 View  Result Date: 05/12/2017 CLINICAL DATA:  Dyspnea EXAM: CHEST  2 VIEW COMPARISON:  05/02/2017 chest radiograph. FINDINGS: Stable configuration of single lead left subclavian ICD. Stable cardiomediastinal silhouette with mild cardiomegaly. No pneumothorax. No pleural effusion. Lungs appear clear, with no acute consolidative airspace disease and no pulmonary edema. IMPRESSION: Stable mild cardiomegaly without overt pulmonary edema. No active pulmonary disease. Electronically Signed   By: Delbert Phenix M.D.   On: 05/12/2017 15:55  US Renal  Result Date: 05/13/2017 CLINICAL DATA:  Acute renal failure EXAM: RENAL / URINARY TRACT ULTRASOUND COMPLETE COMPARISON:  None. FINDINGS: Right Kidney: Length: Enlarged, 16.4 cm. Innumerable cysts throughout the right kidney compatible with polycystic kidney disease, the largest 3.7 cm in the upper pole. Increased echotexture throughout the renal parenchyma. Echogenicity within normal limits. No hydronephrosis. Left Kidney: Length: None large, approximately 15 cm. No hydronephrosis. Innumerable cysts, the largest 3.2 cm. Increased echotexture throughout the renal parenchyma. Bladder: Appears normal for degree of bladder distention. IMPRESSION: Innumerable bilateral renal cysts. Kidneys are enlarged. Findings most compatible  with polycystic kidney disease. No hydronephrosis. Increased echotexture throughout the kidneys. Electronically Signed   By: Charlett Nose M.D.   On: 05/13/2017 12:19   US Venous Img Lower Bilateral  Result Date: 05/29/2017 CLINICAL DATA:  Initial evaluation for acute bilateral lower extremity swelling. EXAM: BILATERAL LOWER EXTREMITY VENOUS DOPPLER ULTRASOUND TECHNIQUE: Gray-scale sonography with graded compression, as well as color Doppler and duplex ultrasound were performed to evaluate the lower extremity deep venous systems from the level of the common femoral vein and including the common femoral, femoral, profunda femoral, popliteal and calf veins including the posterior tibial, peroneal and gastrocnemius veins when visible. The superficial great saphenous vein was also interrogated. Spectral Doppler was utilized to evaluate flow at rest and with distal augmentation maneuvers in the common femoral, femoral and popliteal veins. COMPARISON:  None. FINDINGS: RIGHT LOWER EXTREMITY Common Femoral Vein: No evidence of thrombus. Normal compressibility, respiratory phasicity and response to augmentation. Saphenofemoral Junction: No evidence of thrombus. Normal compressibility and flow on color Doppler imaging. Profunda Femoral Vein: No evidence of thrombus. Normal compressibility and flow on color Doppler imaging. Femoral Vein: No evidence of thrombus. Normal compressibility, respiratory phasicity and response to augmentation. Popliteal Vein: No evidence of thrombus. Normal compressibility, respiratory phasicity and response to augmentation. Calf Veins: No evidence of thrombus. Normal compressibility and flow on color Doppler imaging.Peroneal vein not visualized. Superficial Great Saphenous Vein: No evidence of thrombus. Normal compressibility and flow on color Doppler imaging. Venous Reflux:  None. Other Findings: Diffuse soft tissue edema present within the soft tissues of the right calf. LEFT LOWER EXTREMITY  Common Femoral Vein: No evidence of thrombus. Normal compressibility, respiratory phasicity and response to augmentation. Saphenofemoral Junction: No evidence of thrombus. Normal compressibility and flow on color Doppler imaging. Profunda Femoral Vein: No evidence of thrombus. Normal compressibility and flow on color Doppler imaging. Femoral Vein: No evidence of thrombus. Normal compressibility, respiratory phasicity and response to augmentation. Popliteal Vein: No evidence of thrombus. Normal compressibility, respiratory phasicity and response to augmentation. Calf Veins: No evidence of thrombus. Normal compressibility and flow on color Doppler imaging. Peroneal vein not visualized. Superficial Great Saphenous Vein: No evidence of thrombus. Normal compressibility and flow on color Doppler imaging. Venous Reflux:  None. Other Findings: Diffuse soft tissue edema noted within the soft tissues of the left calf. IMPRESSION: 1. No evidence of DVT within either lower extremity. 2. Diffuse soft tissue edema present within the soft tissues of the bilateral calves. Electronically Signed   By: Rise Mu M.D.   On: 05/29/2017 23:48   Dg Chest Portable 1 View  Result Date: 05/29/2017 CLINICAL DATA:  Shortness of breath EXAM: PORTABLE CHEST 1 VIEW COMPARISON:  05/12/2017 FINDINGS: Mild cardiomegaly without CHF, pneumonia, collapse or consolidation. Negative for edema, effusion or pneumothorax. Left subclavian defibrillator noted. Trachea is midline. No osseous abnormality. IMPRESSION: Mild cardiomegaly without acute process. Electronically Signed   By: Judie Petit.  Shick M.D.   On: 05/29/2017 22:07

## 2017-06-05 NOTE — ED Provider Notes (Signed)
[facility] Emergency Department Provider Note  ____________________________________________   First MD Initiated Contact with Patient 06/05/17 1647     (approximate)  I have reviewed the triage vital signs and the nursing notes.   HISTORY  Chief Complaint Shortness of Breath and Leg Swelling    HPI Kelly Leonard is a 61 y.o. male who self presents to the emergency department with several weeks of progressive bilateral lower extremity swelling and weight gain. He has a past medical history of congestive heart failure with a last known ejection fraction less than 20% and last known echocardiogram below. The patient reports difficulty taking his home medications as they have been change numerous times recently secondary to multiple hospitalizations. He says he is currently taking 80 mg of Lasix twice a day and not taking any beta blockers. He denies chest pain. His symptoms are worse when trying to lie flat. They're worse with ambulation. He says he has never had a heart attack before.   05/13/17 Echo:  Study Conclusions  - Left ventricle: The cavity size was mildly dilated. Systolic   function was severely reduced. The estimated ejection fraction   was <20% Diffuse hypokinesis. Regional wall motion abnormalities   cannot be excluded. Findings consistent with left ventricular   diastolic dysfunction. - Mitral valve: There was mild to moderate regurgitation. - Left atrium: The atrium was mildly dilated. - Right ventricle: The cavity size was mildly dilated. Wall   thickness was normal. - Tricuspid valve: There was moderate regurgitation. - Pulmonary arteries: Systolic pressure was moderately elevated. PA   peak pressure: 55 mm Hg (S). - Pericardium, extracardiac: A trivial pericardial effusion was   identified.  Impressions:  - No significant change compared to study dated 2007.   Past Medical History:  Diagnosis Date  . Anxiety   . CHF  (congestive heart failure) (HCC)   . Chronic systolic congestive heart failure (HCC)   . CKD (chronic kidney disease) stage 4, GFR 15-29 ml/min (HCC)   . COPD (chronic obstructive pulmonary disease) (HCC)   . Dilated cardiomyopathy (HCC)   . Hyperlipidemia   . Hypertension   . MI (myocardial infarction) (HCC)   . Polycystic kidney   . Renal disorder     Patient Active Problem List   Diagnosis Date Noted  . Acute on chronic systolic CHF (congestive heart failure) (HCC) 05/12/2017  . Insomnia 05/05/2017  . Advanced care planning/counseling discussion 03/24/2017  . 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 Hypertension   . Hyperlipidemia   . Chronic systolic congestive heart failure (HCC)   . Dilated cardiomyopathy (HCC)   . Polycystic kidney   . CKD (chronic kidney disease) stage 4, GFR 15-29 ml/min (HCC)   . 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 Anxiety disorder, unspecified 03/03/2013    Past Surgical History:  Procedure Laterality Date  . CARDIAC CATHETERIZATION    . IMPLANTABLE CARDIOVERTER DEFIBRILLATOR (ICD) GENERATOR CHANGE Right 06/23/2015   Procedure: ICD GENERATOR  INITIAL IMPLANT;  Surgeon: Sharion Settler, MD;  Location: [facility] ORS;  Service: Cardiovascular;  Laterality: Right;    Prior to Admission medications   Medication Sig Start Date End Date Taking? Authorizing Provider  aspirin EC 81 MG tablet Take 81 mg by mouth daily.    [provider]  furosemide (LASIX) 40 MG tablet Take 120 mg by mouth daily.     [provider]  hydrOXYzine (ATARAX/VISTARIL) 25 MG tablet Take 1 tablet (25 mg total)  by mouth at bedtime as needed for anxiety (sleep). Patient not taking: Reported on 06/02/2017 05/31/17   Altamese Dilling, MD  losartan (COZAAR) 50 MG tablet Take 1 tablet (50 mg total) by mouth daily. 05/29/17 08/27/17  Delma Freeze, FNP  metolazone (ZAROXOLYN) 2.5 MG tablet Take 1 tablet (2.5 mg total) by mouth  daily. 06/05/17 06/05/18  Merrily Brittle, MD  metoprolol succinate (TOPROL XL) 25 MG 24 hr tablet Take 1 tablet (25 mg total) by mouth once a week. Take 2.5mg  by mouth only TWICE A WEEK as needed for leg swelling 06/05/17 06/05/18  Merrily Brittle, MD  QUEtiapine (SEROQUEL) 50 MG tablet Take 1 tab nightly x 3 days, then increase to 2 tabs daily as tolerated 06/02/17   Particia Nearing, PA-C    Allergies Entresto [sacubitril-valsartan]; Penicillins; Ace inhibitors; Carvedilol; Lorazepam; and Torsemide  Family History  Problem Relation Age of Onset  . Diabetes Mother   . Hypertension Brother   . Heart disease Maternal Grandmother     Social History Social History  Substance Use Topics  . Smoking status: Never Smoker  . Smokeless tobacco: Never Used  . Alcohol use No     Comment: on occasion    Review of Systems Constitutional: No fever/chills Eyes: No visual changes. ENT: No sore throat. Cardiovascular: Denies chest pain. Respiratory: positive for shortness of breath. Gastrointestinal: No abdominal pain.  No nausea, no vomiting.  No diarrhea.  No constipation. Genitourinary: Negative for dysuria. Musculoskeletal: Negative for back pain. Skin: Negative for rash. Neurological: Negative for headaches, focal weakness or numbness.   ____________________________________________   PHYSICAL EXAM:  VITAL SIGNS: ED Triage Vitals  Enc Vitals Group     BP 06/05/17 1535 (!) 139/101     Pulse Rate 06/05/17 1535 96     Resp 06/05/17 1535 18     Temp 06/05/17 1535 97.7 F (36.5 C)     Temp Source 06/05/17 1535 Oral     SpO2 06/05/17 1535 100 %     Weight 06/05/17 1535 151 lb (68.5 kg)     Height 06/05/17 1535  (1.753 m)     Head Circumference --      Peak Flow --      Pain Score 06/05/17 1534 0     Pain Loc --      Pain Edu? --      Excl. in GC? --     Constitutional: alert and oriented 4 well appearing nontoxic no diaphoresis speaks in full clear sentences Eyes:  PERRL EOMI. Head: Atraumatic. Nose: No congestion/rhinnorhea. Mouth/Throat: No trismus Neck: No stridor.   Cardiovascular: Normal rate, regular rhythm. Grossly normal heart sounds.  Good peripheral circulation. able to lie completely flat with only trace jugular venous distention Respiratory: Normal respiratory effort.  No retractions. Lungs CTAB and moving good air Gastrointestinal: soft nontender Musculoskeletal:2+ pitting edema to knees bilaterally legs are equal in size Neurologic:  Normal speech and language. No gross focal neurologic deficits are appreciated. Skin:  Skin is warm, dry and intact. No rash noted. Psychiatric: Mood and affect are normal. Speech and behavior are normal.    ____________________________________________   DIFFERENTIAL includes but not limited to  congestive heart failure, pulmonary edema, medication noncompliance, acute coronary syndrome ____________________________________________   LABS (all labs ordered are listed, but only abnormal results are displayed)  Labs Reviewed  BASIC METABOLIC PANEL - Abnormal; Notable for the following:       Result Value   Sodium 134 (*)  Potassium 5.9 (*)    Chloride 96 (*)    Glucose, Bld 112 (*)    BUN 96 (*)    Creatinine, Ser 4.17 (*)    GFR calc non Af Amer 14 (*)    GFR calc Af Amer 16 (*)    All other components within normal limits  CBC - Abnormal; Notable for the following:    Hemoglobin 12.0 (*)    HCT 37.2 (*)    MCV 79.1 (*)    MCH 25.4 (*)    RDW 15.4 (*)    All other components within normal limits  TROPONIN I - Abnormal; Notable for the following:    Troponin I 0.06 (*)    All other components within normal limits  BRAIN NATRIURETIC PEPTIDE - Abnormal; Notable for the following:    B Natriuretic Peptide 4,465.0 (*)    All other components within normal limits    blood work reviewed interpreted by me shows slight hyperkalemia along with worsening renal function. Elevated troponin is  nonspecific and roughly at his baseline. __________________________________________  EKG  ED ECG REPORT I, Merrily Brittle, the attending physician, personally viewed and interpreted this ECG.  Date: 06/05/2017 EKG Time:  Rate: 96 Rhythm: normal sinus rhythm Intervals: first degree AV block ST/T Wave abnormalities: T-wave inversion in leads V5 and V6 which are consistent with previous EKG done7 days ago Narrative Interpretation: no evidence of acute ischemia  ____________________________________________  RADIOLOGY  chest x-ray reviewed by me shows stable cardiomegaly with no acute disease____________________________________________   PROCEDURES  Procedure(s) performed: no  Procedures  Critical Care performed: no  Observation: no ____________________________________________   INITIAL IMPRESSION / ASSESSMENT AND PLAN / ED COURSE  Pertinent labs & imaging results that were available during my care of the patient were reviewed by me and considered in my medical decision making (see chart for details).  On arrival the patient is very well-appearing. He does show bilateral lower extremity edema however he has no crackles at his bases and is able to lie flat. His medications do not make complete sense as with his degree of heart failure he should be beta blocked. On further questioning he said that carvedilol in the past made him nauseated so he never initiated any other beta blockers. After labs come back I will discuss with his new cardiologist Dr. Mariah Milling.     ----------------------------------------- 6:27 PM on 06/05/2017 -----------------------------------------  I discussed the case with Dr. Mariah Milling who will be the patient's new Cardiologist as of October 2.  He recommends  metoprolol succinate daily as well as 2.5mg  of metolazone twice a week as needed.  the patient verbalizes understanding and agreement with the plan and would like to go home. Strict return  precautions are given. ____________________________________________   FINAL CLINICAL IMPRESSION(S) / ED DIAGNOSES  Final diagnoses:  Peripheral edema  Acute on chronic congestive heart failure, unspecified heart failure type (HCC)      NEW MEDICATIONS STARTED DURING THIS VISIT:  Discharge Medication List as of 06/05/2017  6:31 PM    START taking these medications   Details  metolazone (ZAROXOLYN) 2.5 MG tablet Take 1 tablet (2.5 mg total) by mouth daily., Starting Thu 06/05/2017, Until Fri 06/05/2018, Print    metoprolol succinate (TOPROL XL) 25 MG 24 hr tablet Take 1 tablet (25 mg total) by mouth once a week. Take 2.5mg  by mouth only TWICE A WEEK as needed for leg swelling, Starting Thu 06/05/2017, Until Fri 06/05/2018, Print  Note:  This document was prepared using Dragon voice recognition software and may include unintentional dictation errors.     Merrily Brittle, MD 06/05/17 [phone]

## 2017-06-05 NOTE — ED Triage Notes (Signed)
Pt reports sob and bilateral leg swelling due to CHF. Pt reports increasing difficulty breathing. Pt reports four pound weight gain since yesterday. Pt respirations even and unlabored in triage, denies pain. Pitting edema noted to lower extremities bilaterally.

## 2017-06-05 NOTE — Progress Notes (Addendum)
Metolazone tubed to ED at 18:30. RN entered message to send and was told it was sent. She called pharmacy x 2 because she can't find it, it's not on her desk, and everybody is busy so she can't ask if anyone else has it. Will send again.  18:59 second dose sent at this time to main ED.

## 2017-06-05 NOTE — Telephone Encounter (Signed)
A voice message was left that patient needs to reschedule his imaging. His last appt was canceled due to him being in the hospital. [phone]

## 2017-06-05 NOTE — ED Notes (Signed)
Troponin 0.06 per lab, MD Rifenbark made aware at this time

## 2017-06-05 NOTE — Assessment & Plan Note (Signed)
Allergy to BZDs, failed hydroxyzine and multiple other calming medications. Will try some seroquel. Risks and benefits reviewed. Continue essential oils and home remedies.

## 2017-06-06 ENCOUNTER — Telehealth: Payer: Self-pay | Admitting: Unknown Physician Specialty

## 2017-06-06 MED ORDER — LOSARTAN POTASSIUM 50 MG PO TABS: 50.0000 mg | ORAL_TABLET | Freq: Every day | ORAL | 0 refills | Status: DC

## 2017-06-06 NOTE — Telephone Encounter (Signed)
Can we please change the losartan to normal instead of no print?

## 2017-06-06 NOTE — Telephone Encounter (Signed)
Called pharmacy because according to chart, seroquel was sent in 06/02/17 for a 2 month supply. Pharmacy states that they got this and that the medication was picked up. I asked about the losartan and pharmacy states that it was picked up on the 13th of this month so neither medication are due yet. Pharmacy states that there are no refills on the losartan though. Elnita Maxwell, can we send in a refill for the patient's losartan so it is on file at the pharmacy?  Then I will call and let them know about prescriptions.

## 2017-06-06 NOTE — Telephone Encounter (Signed)
Called and spoke with patient's wife and prescriptions can be sent to [facility] on [address]

## 2017-06-06 NOTE — Telephone Encounter (Signed)
Patient's pharmacy did not have prescription for Caazaar and seroquel 50 mg at the pharmacy yet. Patient and wife unsure what it going on with pharmacy in regards to medication. Patient's wife checking back with provider to see if patient can receive medication to the pharmacy.

## 2017-06-06 NOTE — Telephone Encounter (Signed)
Please find out what pharmacy the patient is currently using. There are multiple in the chart.

## 2017-06-06 NOTE — Telephone Encounter (Signed)
Patient's went to the hospital 06/06/2017. Hospital gave patient medication (metoprolol-255 mg, once daily) to help swelling of feet and legs. The other (metoloazone-2.5 mg, twice a week or as needed for leg swelling) These are two medications given to patient to start taking.   Medication for lasix  changed to 40 mg each changed to twice a day.  Patient needing a prescription for cozaar and Seroquel to take along with other new medications.    Please Advise.  Thank you

## 2017-06-09 ENCOUNTER — Ambulatory Visit: Payer: BLUE CROSS/BLUE SHIELD | Admitting: Family

## 2017-06-09 NOTE — Telephone Encounter (Signed)
Spoke to spouse. Gave scheduling phone number for family to call and schedule due to patient's complex schedule. Orders already in.

## 2017-06-09 NOTE — Progress Notes (Signed)
Cardiology Office Note  Date:  06/10/2017   ID:  Kelly Leonard, DOB 1956-05-05, MRN [id number]  PCP:  Kelly Cirri, NP   Chief Complaint  Patient presents with  . other    New patient : Referred by emergency room : CHF c/o edema ankles/feet. Pt is confused about metoprolol and Furosemide correct instructions. Pt mentioned instructions were incorrect when sent to Pharmacy. Meds reviewed verbally with pt.    HPI:  Kelly Leonard is a 61 year old gentleman with past medical history of Severe anxiety/insomnia Chronic kidney disease, Suspected cardiorenal syndrome , Dr. Thedore Mins 07/2017 COPD Hyperlipidemia Hypertension Chronic systolic CHF Ejection fraction 20% Normal coronary arteries by cardiac catheterization in the past by report Previous ICD placement Previously followed by [facility] Who presents by referral from the emergency room physician, Kelly Leonard, for acute on chronic systolic CHF  Seen in emergency room 06/05/2017 for severe leg edema, shortness of breath Creatinine at that time was greater than 4 felt secondary to cardiorenal syndrome Hospital records reviewed with the patient in detail Started on metoprolol, on metolazone  Lasix 80 mg twice a day  On this regiment he has had dramatic weight loss, now only with trace swelling around his ankles. Shortness of breath dramatically improved, overall feels well  He did not tolerate torsemide, cause her found erythematous rash He has tried numerous medications for insomnia and agitation at night including some antipsychotics which had the reverse effect and caused worsening insomnia   Current  Weight  at home is 134.6, Feels he is close to his dry weight  echo done on 05/13/17 shows LV EF 20-25% along with moderate MR/TR and moderately elevated PA pressure of 55 mmHg.   EKG personally reviewed by myself on todays visit Shows normal sinus rhythm with rate 90 bpm nonspecific ST and T wave abnormality V5, V6, left  axis deviation  Other past medical history reviewed with him  Previous hospital records reviewed Admitted 05/29/17 due to HF exacerbation.   given IV diuretics and then transitioned back to oral diuretics.  Discharged home after 2 days.   Admitted 05/12/17 due to HF exacerbation.  needed IV lasix and then transitioned to oral lasix.  Lost ~3L of fluid while admitted.  Discharged home after 2 days.    ED 05/02/17 due to HF exacerbation. He was treated and released.    ED 03/15/17 due to HF exacerbation. He was treated and released.    PMH:   has a past medical history of Anxiety; CHF (congestive heart failure) (HCC); Chronic systolic congestive heart failure (HCC); CKD (chronic kidney disease) stage 4, GFR 15-29 ml/min (HCC); COPD (chronic obstructive pulmonary disease) (HCC); Dilated cardiomyopathy (HCC); Hyperlipidemia; Hypertension; MI (myocardial infarction) (HCC); Polycystic kidney; and Renal disorder.  PSH:    Past Surgical History:  Procedure Laterality Date  . CARDIAC CATHETERIZATION    . IMPLANTABLE CARDIOVERTER DEFIBRILLATOR (ICD) GENERATOR CHANGE Right 06/23/2015   Procedure: ICD GENERATOR  INITIAL IMPLANT;  Surgeon: Sharion Settler, MD;  Location: [facility] ORS;  Service: Cardiovascular;  Laterality: Right;    Current Outpatient Prescriptions  Medication Sig Dispense Refill  . aspirin EC 81 MG tablet Take 81 mg by mouth daily.    . furosemide (LASIX) 40 MG tablet Take 160 mg by mouth daily.     . metoprolol succinate (TOPROL XL) 25 MG 24 hr tablet Take 1 tablet (25 mg total) by mouth once a week. Take 2.5mg  by mouth only TWICE A WEEK as needed for leg swelling (Patient  taking differently: Take 25 mg by mouth daily. Take 2.5mg  by mouth only TWICE A WEEK as needed for leg swelling) 30 tablet 0  . metolazone (ZAROXOLYN) 2.5 MG tablet Take 1 tablet (2.5 mg total) by mouth daily as needed. 30 tablet 6  . traZODone (DESYREL) 50 MG tablet Take 0.5-1 tablets (25-50 mg total) by mouth at  bedtime as needed for sleep. 30 tablet 3   No current facility-administered medications for this visit.      Allergies:   Entresto [sacubitril-valsartan]; Penicillins; Ace inhibitors; Carvedilol; Seroquel [quetiapine]; Lorazepam; and Torsemide   Social History:  The patient  reports that he has never smoked. He has never used smokeless tobacco. He reports that he does not drink alcohol or use drugs.   Family History:   family history includes Diabetes in his mother; Heart attack in his maternal grandmother; Heart disease in his maternal grandmother; Hypertension in his brother.    Review of Systems: Review of Systems  Constitutional: Positive for weight loss.  Respiratory: Negative.   Cardiovascular: Positive for leg swelling.  Gastrointestinal: Negative.   Musculoskeletal: Negative.   Neurological: Negative.   Psychiatric/Behavioral: Negative.   All other systems reviewed and are negative.    PHYSICAL EXAM: VS:  BP 122/82 (BP Location: Right Arm, Patient Position: Sitting, Cuff Size: Normal)   Pulse 90   Ht 5\' 9"  (1.753 m)   Wt 140 lb 12 oz (63.8 kg)   BMI 20.79 kg/m  , BMI Body mass index is 20.79 kg/m. GEN: Thin, well developed, in no acute distress  HEENT: normal  Neck: no JVD, carotid bruits, or masses Cardiac: RRR; no murmurs, rubs, or gallops, trace ankle edema bilaterally Respiratory:  clear to auscultation bilaterally, normal work of breathing GI: soft, nontender, nondistended, + BS MS: no deformity or atrophy  Skin: warm and dry, no rash Neuro:  Strength and sensation are intact Psych: euthymic mood, full affect    Recent Labs: 02/12/2017: TSH 2.790 05/14/2017: ALT 89 06/05/2017: B Natriuretic Peptide 4,465.0; BUN 96; Creatinine, Ser 4.17; Hemoglobin 12.0; Platelets 273; Potassium 5.9; Sodium 134    Lipid Panel Lab Results  Component Value Date   CHOL 201 (H) 02/12/2017   HDL 63 02/12/2017   LDLCALC 120 (H) 02/12/2017   TRIG 88 02/12/2017      Wt  Readings from Last 3 Encounters:  06/10/17 140 lb 12 oz (63.8 kg)  06/05/17 151 lb (68.5 kg)  06/02/17 155 lb 1.6 oz (70.4 kg)       ASSESSMENT AND PLAN:  Chronic systolic congestive heart failure (HCC) -  Nonischemic cardiomyopathy, Frequent hospital admissions for fluid overload Started on Lasix 80 twice a day, metolazone 2.5 mg twice a week on recent emergency room visit September 27. Weight down dramatically, overall feels well Goal weight at home recommended 130 up to 133 pounds. Current weight 134 pounds at home Her weight is 6 pounds higher in the office Suggested he decrease Lasix down to 80 mg daily and hold metolazone  for weight less than 127 pounds to avoid dehydration Suggested he take Lasix 80 twice a day with metolazone 2.5 for weight of 138 pounds or higher He has follow-up with CHF clinic and nephrology  Dilated cardiomyopathy (HCC) - Plan: EKG 12-Lead, Ambulatory referral to Cardiac Electrophysiology  CKD (chronic kidney disease) stage 4, GFR 15-29 ml/min (HCC) Recent climb in creatinine up to more than 4 in the setting of cardiorenal syndrome, massive fluid overload. We have offered repeat BMP but he  has declined, prefers to have this done in follow-up visits with nephrology Suspect his renal function with improve with diuresis, suspected cardiorenal   Cardiorenal syndrome with renal failure, stage 1-4 or unspecified chronic kidney disease, with heart failure (HCC) As detailed below, he has follow-up with Dr. Thedore Mins, nephrology We have offered basic metabolic panel today, he has declined and prefers to have this checked with nephrology  ICD (implantable cardioverter-defibrillator) in place We will schedule an appointment for him to see Dr. Graciela Husbands, establish care   Anxiety disorder, unspecified type Long history of anxiety, insomnia He brings with him a bag of various medications that he has tried for sleep including various benzodiazepines and  antipsychotics Suggested he talk with primary care, consider trazodone He has tried S is are eyes, Ativan, Neurontin among others  Disposition:   F/U  6 months   Total encounter time more than 60 minutes  Greater than 50% was spent in counseling and coordination of care with the patient    Orders Placed This Encounter  Procedures  . Ambulatory referral to Cardiac Electrophysiology  . EKG 12-Lead     Signed, Dossie Arbour, M.D., Ph.D. 06/10/2017  [facility] [place], [place] [phone]

## 2017-06-10 ENCOUNTER — Ambulatory Visit (INDEPENDENT_AMBULATORY_CARE_PROVIDER_SITE_OTHER): Payer: BLUE CROSS/BLUE SHIELD | Admitting: Cardiovascular Disease

## 2017-06-10 ENCOUNTER — Ambulatory Visit: Payer: BLUE CROSS/BLUE SHIELD | Admitting: Family

## 2017-06-10 ENCOUNTER — Encounter: Payer: Self-pay | Admitting: Cardiovascular Disease

## 2017-06-10 VITALS — BP 122/82 | HR 90 | Ht 69.0 in | Wt 140.8 lb

## 2017-06-10 DIAGNOSIS — I5022 Chronic systolic (congestive) heart failure: Secondary | ICD-10-CM | POA: Diagnosis not present

## 2017-06-10 DIAGNOSIS — N184 Chronic kidney disease, stage 4 (severe): Secondary | ICD-10-CM | POA: Diagnosis not present

## 2017-06-10 DIAGNOSIS — Z95810 Presence of automatic (implantable) cardiac defibrillator: Secondary | ICD-10-CM

## 2017-06-10 DIAGNOSIS — F419 Anxiety disorder, unspecified: Secondary | ICD-10-CM

## 2017-06-10 DIAGNOSIS — I130 Hypertensive heart and chronic kidney disease with heart failure and stage 1 through stage 4 chronic kidney disease, or unspecified chronic kidney disease: Secondary | ICD-10-CM

## 2017-06-10 DIAGNOSIS — I420 Dilated cardiomyopathy: Secondary | ICD-10-CM

## 2017-06-10 MED ORDER — TRAZODONE HCL 50 MG PO TABS: 25.0000 mg | ORAL_TABLET | Freq: Every evening | ORAL | 3 refills | Status: DC | PRN

## 2017-06-10 MED ORDER — METOLAZONE 2.5 MG PO TABS: 2.5000 mg | ORAL_TABLET | Freq: Every day | ORAL | 6 refills | Status: DC | PRN

## 2017-06-10 NOTE — Patient Instructions (Addendum)
We will schedule an appt with Dr. Graciela Husbands for establish ICD   Medication Instructions:    Goal weight 130 to 133 For weight <127, decrease lasix down to 80 once a day, no metolazone  For weight 138 to 140 lbs, Take metolazone with lasix 80 BID  Ask PMD about trazodone for sleep  Labwork:  No new labs needed  Testing/Procedures:  No further testing at this time   Follow-Up: It was a pleasure seeing you in the office today. Please call us if you have new issues that need to be addressed before your next appt.  [phone]  Your physician wants you to follow-up in: 6 months.  You will receive a reminder letter in the mail two months in advance. If you don't receive a letter, please call our office to schedule the follow-up appointment.  If you need a refill on your cardiac medications before your next appointment, please call your pharmacy.

## 2017-06-10 NOTE — Telephone Encounter (Signed)
Patient informed by Dr. Odis Luster to get a sleep medication (trazodone).   Patient would like medication sent to [facility] in [place].   Please Advise.  Thank you

## 2017-06-10 NOTE — Telephone Encounter (Signed)
Called and let patient's wife know that trazodone has been sent in for him.

## 2017-06-10 NOTE — Telephone Encounter (Signed)
Routing to provider  

## 2017-06-10 NOTE — Telephone Encounter (Signed)
Trazadone sent

## 2017-06-12 ENCOUNTER — Emergency Department (HOSPITAL_COMMUNITY)
Admission: EM | Admit: 2017-06-12 | Discharge: 2017-06-12 | Disposition: A | Discharge status: Home / Self Care | Payer: BLUE CROSS/BLUE SHIELD | Attending: Emergency Medicine | Admitting: Emergency Medicine

## 2017-06-12 ENCOUNTER — Emergency Department (HOSPITAL_COMMUNITY): Payer: BLUE CROSS/BLUE SHIELD

## 2017-06-12 ENCOUNTER — Other Ambulatory Visit: Payer: Self-pay

## 2017-06-12 ENCOUNTER — Encounter (HOSPITAL_COMMUNITY): Payer: Self-pay | Admitting: Emergency Medicine

## 2017-06-12 DIAGNOSIS — G4700 Insomnia, unspecified: Secondary | ICD-10-CM | POA: Insufficient documentation

## 2017-06-12 DIAGNOSIS — Z8679 Personal history of other diseases of the circulatory system: Secondary | ICD-10-CM | POA: Diagnosis not present

## 2017-06-12 DIAGNOSIS — I130 Hypertensive heart and chronic kidney disease with heart failure and stage 1 through stage 4 chronic kidney disease, or unspecified chronic kidney disease: Secondary | ICD-10-CM | POA: Insufficient documentation

## 2017-06-12 DIAGNOSIS — N184 Chronic kidney disease, stage 4 (severe): Secondary | ICD-10-CM | POA: Insufficient documentation

## 2017-06-12 DIAGNOSIS — I5023 Acute on chronic systolic (congestive) heart failure: Secondary | ICD-10-CM | POA: Diagnosis not present

## 2017-06-12 DIAGNOSIS — J449 Chronic obstructive pulmonary disease, unspecified: Secondary | ICD-10-CM | POA: Diagnosis not present

## 2017-06-12 DIAGNOSIS — R0602 Shortness of breath: Secondary | ICD-10-CM | POA: Diagnosis present

## 2017-06-12 LAB — BASIC METABOLIC PANEL
Anion gap: 15 (ref 5–15)
BUN: 92 mg/dL — AB (ref 6–20)
CALCIUM: 9 mg/dL (ref 8.9–10.3)
CO2: 28 mmol/L (ref 22–32)
CREATININE: 3.61 mg/dL — AB (ref 0.61–1.24)
Chloride: 88 mmol/L — ABNORMAL LOW (ref 101–111)
GFR calc non Af Amer: 17 mL/min — ABNORMAL LOW (ref >60)
GFR, EST AFRICAN AMERICAN: 20 mL/min — AB (ref >60)
GLUCOSE: 147 mg/dL — AB (ref 65–99)
Potassium: 4 mmol/L (ref 3.5–5.1)
Sodium: 131 mmol/L — ABNORMAL LOW (ref 135–145)

## 2017-06-12 LAB — CBC
HCT: 36.8 % — ABNORMAL LOW (ref 39.0–52.0)
Hemoglobin: 12 g/dL — ABNORMAL LOW (ref 13.0–17.0)
MCH: 25.5 pg — AB (ref 26.0–34.0)
MCHC: 32.6 g/dL (ref 30.0–36.0)
MCV: 78.3 fL (ref 78.0–100.0)
PLATELETS: 325 10*3/uL (ref 150–400)
RBC: 4.7 MIL/uL (ref 4.22–5.81)
RDW: 15.1 % (ref 11.5–15.5)
WBC: 9.7 10*3/uL (ref 4.0–10.5)

## 2017-06-12 LAB — I-STAT TROPONIN, ED: TROPONIN I, POC: 0.05 ng/mL (ref 0.00–0.08)

## 2017-06-12 MED ORDER — ZOLPIDEM TARTRATE 5 MG PO TABS: 5.0000 mg | ORAL_TABLET | Freq: Every evening | ORAL | 0 refills | Status: DC | PRN

## 2017-06-12 MED ORDER — ONDANSETRON HCL 4 MG/2ML IJ SOLN
4.0000 mg | Freq: Once | INTRAMUSCULAR | Status: AC
  Administered 2017-06-12: 4 mg via INTRAVENOUS
  Filled 2017-06-12: qty 2

## 2017-06-12 NOTE — ED Notes (Signed)
Got patient undress on the monitor patient is resting with family at bedside and call bell in reach 

## 2017-06-12 NOTE — ED Triage Notes (Signed)
Pt in triage reporting SOB and inability to sleep.  Pt appears very anxious, unable to sit still.  Pt reports no sleep since Monday morning.

## 2017-06-12 NOTE — ED Notes (Signed)
Dr. Denton Lank at the bedside to evaluate patient.

## 2017-06-12 NOTE — ED Notes (Signed)
Pt stepping outside for a moment, states will return. States is anxious and needs to walk.

## 2017-06-12 NOTE — Discharge Instructions (Signed)
It was our pleasure to provide your ER care today - we hope that you feel better.  Rest. Get adequate relaxation and try stress relaxation techniques (exercise, yoga, meditation).  You may also try taking ambien for sleep - no driving when taking.  Follow up with primary care doctor this week - have your blood pressure rechecked then as well, as it is high today - also discuss referral for possible outpatient counseling to see if that would help your symptoms.   Return to ER if worse, new symptoms, fevers, trouble breathing, new or severe pain, other concern.

## 2017-06-12 NOTE — ED Provider Notes (Addendum)
[facility] Provider Note   CSN: [id number] Arrival date & time: 06/12/17  0346     History   Chief Complaint Chief Complaint  Patient presents with  . Shortness of Breath    HPI Zollie Ellery is a 61 y.o. male.  Patient with hx copd, chf, c/o feeling restless at night, which keeps him from sleeping.  Will be up pacing about, feeling nervous.  States symptoms present in past month - no acute or abrupt worsening today or this week.  Symptoms daily, persistent. Denies unusual stressors or increased anxiety. Denies depression. No thoughts of self harm.  States breathing seems about baseline. No pnd, no increased swelling. Denies pain, no chest pain. Compliant w home meds - w exception that states has been tried on a variety of meds including vistaril, ativan, benadryl, trazodone - and states has not tolerated any of them, as he does not like how they make him feel. Spouse also requests referral to pcp. Indicates has cardiology follow up for chf.    The history is provided by the patient and the spouse.    Past Medical History:  Diagnosis Date  . Anxiety   . CHF (congestive heart failure) (HCC)   . Chronic systolic congestive heart failure (HCC)   . CKD (chronic kidney disease) stage 4, GFR 15-29 ml/min (HCC)   . COPD (chronic obstructive pulmonary disease) (HCC)   . Dilated cardiomyopathy (HCC)   . Hyperlipidemia   . Hypertension   . MI (myocardial infarction) (HCC)   . Polycystic kidney   . Renal disorder     Patient Active Problem List   Diagnosis Date Noted  . Acute on chronic systolic CHF (congestive heart failure) (HCC) 05/12/2017  . Insomnia 05/05/2017  . Advanced care planning/counseling discussion 03/24/2017  . 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 Hypertension   . Hyperlipidemia   . Chronic systolic congestive heart failure (HCC)   . Dilated cardiomyopathy (HCC)   . Polycystic kidney   . CKD (chronic kidney disease) stage 4, GFR 15-29  ml/min (HCC)   . 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 Anxiety disorder, unspecified 03/03/2013    Past Surgical History:  Procedure Laterality Date  . CARDIAC CATHETERIZATION    . IMPLANTABLE CARDIOVERTER DEFIBRILLATOR (ICD) GENERATOR CHANGE Right 06/23/2015   Procedure: ICD GENERATOR  INITIAL IMPLANT;  Surgeon: Sharion Settler, MD;  Location: [facility] ORS;  Service: Cardiovascular;  Laterality: Right;       Home Medications    Prior to Admission medications   Medication Sig Start Date End Date Taking? Authorizing Provider  aspirin EC 81 MG tablet Take 81 mg by mouth daily.    [provider]  furosemide (LASIX) 40 MG tablet Take 160 mg by mouth daily.     [provider]  metolazone (ZAROXOLYN) 2.5 MG tablet Take 1 tablet (2.5 mg total) by mouth daily as needed. 06/10/17   Antonieta Iba, MD  metoprolol succinate (TOPROL XL) 25 MG 24 hr tablet Take 1 tablet (25 mg total) by mouth once a week. Take 2.5mg  by mouth only TWICE A WEEK as needed for leg swelling Patient taking differently: Take 25 mg by mouth daily. Take 2.5mg  by mouth only TWICE A WEEK as needed for leg swelling 06/05/17 06/05/18  Merrily Brittle, MD  traZODone (DESYREL) 50 MG tablet Take 0.5-1 tablets (25-50 mg total) by mouth at bedtime as needed for sleep. 06/10/17   Gabriel Cirri, NP    Family History  Family History  Problem Relation Age of Onset  . Diabetes Mother   . Hypertension Brother   . Heart disease Maternal Grandmother   . Heart attack Maternal Grandmother     Social History Social History  Substance Use Topics  . Smoking status: Never Smoker  . Smokeless tobacco: Never Used  . Alcohol use No     Comment: on occasion     Allergies   Entresto [sacubitril-valsartan]; Penicillins; Ace inhibitors; Carvedilol; Seroquel [quetiapine]; Lorazepam; and Torsemide   Review of Systems Review of Systems  Constitutional: Negative for fever.  HENT: Negative for  sore throat.   Eyes: Negative for redness.  Respiratory: Negative for shortness of breath.   Cardiovascular: Negative for chest pain.  Gastrointestinal: Negative for abdominal pain.  Genitourinary: Negative for flank pain.  Musculoskeletal: Negative for back pain and neck pain.  Skin: Negative for rash.  Neurological: Negative for headaches.  Hematological: Does not bruise/bleed easily.  Psychiatric/Behavioral: The patient is nervous/anxious.      Physical Exam Updated Vital Signs BP (!) 165/99   Pulse 96   Temp 98.3 F (36.8 C) (Oral)   Resp 20   SpO2 100%   Physical Exam  Constitutional: He is oriented to person, place, and time. He appears well-developed and well-nourished. No distress.  HENT:  Mouth/Throat: Oropharynx is clear and moist.  Eyes: Conjunctivae are normal.  Neck: Neck supple. No tracheal deviation present. No thyromegaly present.  Cardiovascular: Normal rate, regular rhythm, normal heart sounds and intact distal pulses.  Exam reveals no friction rub.   No murmur heard. Pulmonary/Chest: Effort normal and breath sounds normal. No accessory muscle usage. No respiratory distress.  Abdominal: Soft. He exhibits no distension. There is no tenderness.  Musculoskeletal: He exhibits no edema or tenderness.  Neurological: He is alert and oriented to person, place, and time.  Ambulates w steady gait.   Skin: Skin is warm and dry.  Psychiatric:  Appears mildly anxious, pacing.   Nursing note and vitals reviewed.    ED Treatments / Results  Labs (all labs ordered are listed, but only abnormal results are displayed) Results for orders placed or performed during the hospital encounter of 06/12/17  Basic metabolic panel  Result Value Ref Range   Sodium 131 (L) 135 - 145 mmol/L   Potassium 4.0 3.5 - 5.1 mmol/L   Chloride 88 (L) 101 - 111 mmol/L   CO2 28 22 - 32 mmol/L   Glucose, Bld 147 (H) 65 - 99 mg/dL   BUN 92 (H) 6 - 20 mg/dL   Creatinine, Ser 7.50 (H) 0.61 -  1.24 mg/dL   Calcium 9.0 8.9 - 51.8 mg/dL   GFR calc non Af Amer 17 (L) >60 mL/min   GFR calc Af Amer 20 (L) >60 mL/min   Anion gap 15 5 - 15  CBC  Result Value Ref Range   WBC 9.7 4.0 - 10.5 K/uL   RBC 4.70 4.22 - 5.81 MIL/uL   Hemoglobin 12.0 (L) 13.0 - 17.0 g/dL   HCT 33.5 (L) 82.5 - 18.9 %   MCV 78.3 78.0 - 100.0 fL   MCH 25.5 (L) 26.0 - 34.0 pg   MCHC 32.6 30.0 - 36.0 g/dL   RDW 84.2 10.3 - 12.8 %   Platelets 325 150 - 400 K/uL  I-stat troponin, ED  Result Value Ref Range   Troponin i, poc 0.05 0.00 - 0.08 ng/mL   Comment 3  Dg Chest 2 View  Result Date: 06/12/2017 CLINICAL DATA:  Mid chest pain tonight. EXAM: CHEST  2 VIEW COMPARISON:  06/05/2017 FINDINGS: Cardiac pacemaker. Cardiac enlargement without vascular congestion. No edema or consolidation. No blunting of costophrenic angles. No pneumothorax. Mediastinal contours appear intact. IMPRESSION: Cardiac enlargement.  No evidence of active pulmonary disease. Electronically Signed   By: Burman Nieves M.D.   On: 06/12/2017 04:51   Dg Chest 2 View  Result Date: 06/05/2017 CLINICAL DATA:  Shortness of breath and bilateral leg swelling, initial encounter EXAM: CHEST  2 VIEW COMPARISON:  05/29/2017 FINDINGS: Cardiac shadow is enlarged. Defibrillator is again identified and stable. The lungs are well aerated bilaterally. No focal infiltrate or sizable effusion is seen. No acute bony abnormality is noted. IMPRESSION: Stable cardiomegaly without acute abnormality. Electronically Signed   By: Alcide Clever M.D.   On: 06/05/2017 16:07   US Renal  Result Date: 05/13/2017 CLINICAL DATA:  Acute renal failure EXAM: RENAL / URINARY TRACT ULTRASOUND COMPLETE COMPARISON:  None. FINDINGS: Right Kidney: Length: Enlarged, 16.4 cm. Innumerable cysts throughout the right kidney compatible with polycystic kidney disease, the largest 3.7 cm in the upper pole. Increased echotexture throughout the renal parenchyma. Echogenicity within normal  limits. No hydronephrosis. Left Kidney: Length: None large, approximately 15 cm. No hydronephrosis. Innumerable cysts, the largest 3.2 cm. Increased echotexture throughout the renal parenchyma. Bladder: Appears normal for degree of bladder distention. IMPRESSION: Innumerable bilateral renal cysts. Kidneys are enlarged. Findings most compatible with polycystic kidney disease. No hydronephrosis. Increased echotexture throughout the kidneys. Electronically Signed   By: Charlett Nose M.D.   On: 05/13/2017 12:19   US Venous Img Lower Bilateral  Result Date: 05/29/2017 CLINICAL DATA:  Initial evaluation for acute bilateral lower extremity swelling. EXAM: BILATERAL LOWER EXTREMITY VENOUS DOPPLER ULTRASOUND TECHNIQUE: Gray-scale sonography with graded compression, as well as color Doppler and duplex ultrasound were performed to evaluate the lower extremity deep venous systems from the level of the common femoral vein and including the common femoral, femoral, profunda femoral, popliteal and calf veins including the posterior tibial, peroneal and gastrocnemius veins when visible. The superficial great saphenous vein was also interrogated. Spectral Doppler was utilized to evaluate flow at rest and with distal augmentation maneuvers in the common femoral, femoral and popliteal veins. COMPARISON:  None. FINDINGS: RIGHT LOWER EXTREMITY Common Femoral Vein: No evidence of thrombus. Normal compressibility, respiratory phasicity and response to augmentation. Saphenofemoral Junction: No evidence of thrombus. Normal compressibility and flow on color Doppler imaging. Profunda Femoral Vein: No evidence of thrombus. Normal compressibility and flow on color Doppler imaging. Femoral Vein: No evidence of thrombus. Normal compressibility, respiratory phasicity and response to augmentation. Popliteal Vein: No evidence of thrombus. Normal compressibility, respiratory phasicity and response to augmentation. Calf Veins: No evidence of  thrombus. Normal compressibility and flow on color Doppler imaging.Peroneal vein not visualized. Superficial Great Saphenous Vein: No evidence of thrombus. Normal compressibility and flow on color Doppler imaging. Venous Reflux:  None. Other Findings: Diffuse soft tissue edema present within the soft tissues of the right calf. LEFT LOWER EXTREMITY Common Femoral Vein: No evidence of thrombus. Normal compressibility, respiratory phasicity and response to augmentation. Saphenofemoral Junction: No evidence of thrombus. Normal compressibility and flow on color Doppler imaging. Profunda Femoral Vein: No evidence of thrombus. Normal compressibility and flow on color Doppler imaging. Femoral Vein: No evidence of thrombus. Normal compressibility, respiratory phasicity and response to augmentation. Popliteal Vein: No evidence of thrombus. Normal compressibility, respiratory phasicity and response to augmentation.  Calf Veins: No evidence of thrombus. Normal compressibility and flow on color Doppler imaging. Peroneal vein not visualized. Superficial Great Saphenous Vein: No evidence of thrombus. Normal compressibility and flow on color Doppler imaging. Venous Reflux:  None. Other Findings: Diffuse soft tissue edema noted within the soft tissues of the left calf. IMPRESSION: 1. No evidence of DVT within either lower extremity. 2. Diffuse soft tissue edema present within the soft tissues of the bilateral calves. Electronically Signed   By: Rise Mu M.D.   On: 05/29/2017 23:48   Dg Chest Portable 1 View  Result Date: 05/29/2017 CLINICAL DATA:  Shortness of breath EXAM: PORTABLE CHEST 1 VIEW COMPARISON:  05/12/2017 FINDINGS: Mild cardiomegaly without CHF, pneumonia, collapse or consolidation. Negative for edema, effusion or pneumothorax. Left subclavian defibrillator noted. Trachea is midline. No osseous abnormality. IMPRESSION: Mild cardiomegaly without acute process. Electronically Signed   By: Judie Petit.  Shick M.D.    On: 05/29/2017 22:07    EKG  EKG Interpretation  Date/Time:  Thursday June 12 2017 03:59:42 EDT Ventricular Rate:  86 PR Interval:  208 QRS Duration: 102 QT Interval:  394 QTC Calculation: 471 R Axis:   -56 Text Interpretation:  Sinus rhythm Pulmonary disease pattern Left anterior fascicular block Non-specific ST-t changes No significant change since last tracing Reconfirmed by Cathren Laine ([id number]) on 06/12/2017 9:16:52 AM       Radiology Dg Chest 2 View  Result Date: 06/12/2017 CLINICAL DATA:  Mid chest pain tonight. EXAM: CHEST  2 VIEW COMPARISON:  06/05/2017 FINDINGS: Cardiac pacemaker. Cardiac enlargement without vascular congestion. No edema or consolidation. No blunting of costophrenic angles. No pneumothorax. Mediastinal contours appear intact. IMPRESSION: Cardiac enlargement.  No evidence of active pulmonary disease. Electronically Signed   By: Burman Nieves M.D.   On: 06/12/2017 04:51    Procedures Procedures (including critical care time)  Medications Ordered in ED Medications  ondansetron (ZOFRAN) injection 4 mg (4 mg Intravenous Given 06/12/17 0948)     Initial Impression / Assessment and Plan / ED Course  I have reviewed the triage vital signs and the nursing notes.  Pertinent labs & imaging results that were available during my care of the patient were reviewed by me and considered in my medical decision making (see chart for details).  Labs appear c/w baseline.  Ambulatory in ED, no increased wob.   Pt requests something to help w sleep.   Feel pt also could benefit from close outpt pcp f/u, and outpt counseling.   Reviewed nursing notes and prior charts for additional history.   Pt currently appears stable for d/c.     Final Clinical Impressions(s) / ED Diagnoses   Final diagnoses:  None    New Prescriptions New Prescriptions   No medications on file       Cathren Laine, MD 06/12/17 1056

## 2017-06-12 NOTE — ED Notes (Signed)
Patient has been seen by a nephrologist in [place] but does not have any care established in this area.

## 2017-06-14 ENCOUNTER — Ambulatory Visit
Admission: EM | Admit: 2017-06-14 | Discharge: 2017-06-14 | Disposition: A | Discharge status: Home / Self Care | Payer: BLUE CROSS/BLUE SHIELD | Attending: Family Medicine | Admitting: Family Medicine

## 2017-06-14 DIAGNOSIS — G4700 Insomnia, unspecified: Secondary | ICD-10-CM | POA: Diagnosis not present

## 2017-06-14 NOTE — Discharge Instructions (Signed)
[facility] [place]

## 2017-06-14 NOTE — ED Triage Notes (Addendum)
Pt reports he started one month ago with feeling SOB which then causes anxiety. Seen at [facility] in August, was hospitalized in Sept. With CHF. Had another ER visit on Thursday for same. No improvement. Sx are much worse at night. Also reports vomiting off and on x past week.

## 2017-06-14 NOTE — ED Provider Notes (Signed)
[facility]    CSN: [id number] Arrival date & time: 06/14/17  1437     History   Chief Complaint Chief Complaint  Patient presents with  . Shortness of Breath    HPI Kelly Leonard is a 61 y.o. male.   61 yo male with a h/o CHF, anxiety, chronic insomnia presents with a c/o not being able to sleep. Patient has been seen multiple times recently at the ED (most recently 2 days ago on 06/12/17) and recently hospitalized on 05/29/17. He has been on multiple medications for sleep including most recently Ambien (which patient states made him hallucinate); however he's also tried benzodiazepines, atarax, benadryl, trazodone, seroquel all without relief and/or with adverse effects. Currently patient denies shortness of breath, chest pains, leg swelling. States he's only taking his 2 diuretics (furosemide and metolazone) metoprolol and aspirin.    The history is provided by the patient.    Past Medical History:  Diagnosis Date  . Anxiety   . CHF (congestive heart failure) (HCC)   . Chronic systolic congestive heart failure (HCC)   . CKD (chronic kidney disease) stage 4, GFR 15-29 ml/min (HCC)   . COPD (chronic obstructive pulmonary disease) (HCC)   . Dilated cardiomyopathy (HCC)   . Hyperlipidemia   . Hypertension   . MI (myocardial infarction) (HCC)   . Polycystic kidney   . Renal disorder     Patient Active Problem List   Diagnosis Date Noted  . Acute on chronic systolic CHF (congestive heart failure) (HCC) 05/12/2017  . Insomnia 05/05/2017  . Advanced care planning/counseling discussion 03/24/2017  . 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 Hypertension   . Hyperlipidemia   . Chronic systolic congestive heart failure (HCC)   . Dilated cardiomyopathy (HCC)   . Polycystic kidney   . CKD (chronic kidney disease) stage 4, GFR 15-29 ml/min (HCC)   . 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 Anxiety disorder, unspecified  03/03/2013    Past Surgical History:  Procedure Laterality Date  . CARDIAC CATHETERIZATION    . IMPLANTABLE CARDIOVERTER DEFIBRILLATOR (ICD) GENERATOR CHANGE Right 06/23/2015   Procedure: ICD GENERATOR  INITIAL IMPLANT;  Surgeon: Sharion Settler, MD;  Location: [facility] ORS;  Service: Cardiovascular;  Laterality: Right;       Home Medications    Prior to Admission medications   Medication Sig Start Date End Date Taking? Authorizing Provider  aspirin EC 81 MG tablet Take 81 mg by mouth daily.    [provider]  furosemide (LASIX) 40 MG tablet Take 160 mg by mouth daily.     [provider]  metolazone (ZAROXOLYN) 2.5 MG tablet Take 1 tablet (2.5 mg total) by mouth daily as needed. 06/10/17   Antonieta Iba, MD  metoprolol succinate (TOPROL XL) 25 MG 24 hr tablet Take 1 tablet (25 mg total) by mouth once a week. Take 2.5mg  by mouth only TWICE A WEEK as needed for leg swelling Patient taking differently: Take 25 mg by mouth daily. Take 2.5mg  by mouth only TWICE A WEEK as needed for leg swelling 06/05/17 06/05/18  Merrily Brittle, MD  traZODone (DESYREL) 50 MG tablet Take 0.5-1 tablets (25-50 mg total) by mouth at bedtime as needed for sleep. 06/10/17   Gabriel Cirri, NP  zolpidem (AMBIEN) 5 MG tablet Take 1 tablet (5 mg total) by mouth at bedtime as needed for sleep. 06/12/17 07/12/17  Cathren Laine, MD    Family History Family History  Problem Relation  Age of Onset  . Diabetes Mother   . Hypertension Brother   . Heart disease Maternal Grandmother   . Heart attack Maternal Grandmother     Social History Social History  Substance Use Topics  . Smoking status: Never Smoker  . Smokeless tobacco: Never Used  . Alcohol use No     Allergies   Entresto [sacubitril-valsartan]; Penicillins; Ace inhibitors; Carvedilol; Hydroxyzine; Seroquel [quetiapine]; Sertraline; Trazodone and nefazodone; Lorazepam; and Torsemide   Review of Systems Review of Systems   Physical  Exam Triage Vital Signs ED Triage Vitals  Enc Vitals Group     BP 06/14/17 1451 (!) 143/105     Pulse Rate 06/14/17 1451 (!) 105     Resp 06/14/17 1451 20     Temp 06/14/17 1458 (!) 97.2 F (36.2 C)     Temp Source 06/14/17 1458 Oral     SpO2 06/14/17 1451 100 %     Weight 06/14/17 1452 136 lb (61.7 kg)     Height 06/14/17 1452 5\' 9"  (1.753 m)     Head Circumference --      Peak Flow --      Pain Score 06/14/17 1458 3     Pain Loc --      Pain Edu? --      Excl. in GC? --    No data found.   Updated Vital Signs BP (!) 143/105 (BP Location: Right Arm)   Pulse (!) 105   Temp (!) 97.2 F (36.2 C) (Oral)   Resp 20   Ht 5\' 9"  (1.753 m)   Wt 136 lb (61.7 kg)   SpO2 100%   BMI 20.08 kg/m   Visual Acuity Right Eye Distance:   Left Eye Distance:   Bilateral Distance:    Right Eye Near:   Left Eye Near:    Bilateral Near:     Physical Exam  Constitutional: He appears well-developed and well-nourished. No distress.  HENT:  Head: Normocephalic and atraumatic.  Mouth/Throat: Uvula is midline and mucous membranes are normal. No oropharyngeal exudate or tonsillar abscesses.  Neck: Neck supple. No tracheal deviation present. No thyromegaly present.  Cardiovascular: Normal rate, regular rhythm and normal heart sounds.   Pulmonary/Chest: Effort normal and breath sounds normal. No stridor. No respiratory distress. He has no wheezes. He has no rales. He exhibits no tenderness.  Musculoskeletal: He exhibits no edema.  Lymphadenopathy:    He has no cervical adenopathy.  Neurological: He is alert.  Skin: Skin is warm and dry. No rash noted. He is not diaphoretic.  Nursing note and vitals reviewed.    UC Treatments / Results  Labs (all labs ordered are listed, but only abnormal results are displayed) Labs Reviewed - No data to display  EKG  EKG Interpretation None       Radiology No results found.  Procedures Procedures (including critical care  time)  Medications Ordered in UC Medications - No data to display   Initial Impression / Assessment and Plan / UC Course  I have reviewed the triage vital signs and the nursing notes.  Pertinent labs & imaging results that were available during my care of the patient were reviewed by me and considered in my medical decision making (see chart for details).       Final Clinical Impressions(s) / UC Diagnoses   Final diagnoses:  Insomnia, unspecified type    New Prescriptions Discharge Medication List as of 06/14/2017  3:22 PM     1.  diagnosis reviewed with patient 2. Recommend supportive treatment with otc sleeping aid melatonin and chamomile tea 3. Follow-up prn if symptoms worsen or don't improve  Controlled Substance Prescriptions [place] Controlled Substance Registry consulted? Not Applicable   Payton Mccallum, MD 06/14/17 [phone]

## 2017-06-16 ENCOUNTER — Ambulatory Visit
Admission: RE | Admit: 2017-06-16 | Discharge: 2017-06-16 | Disposition: A | Discharge status: Home / Self Care | Payer: BLUE CROSS/BLUE SHIELD | Source: Ambulatory Visit | Attending: Gastroenterology | Admitting: Gastroenterology

## 2017-06-16 DIAGNOSIS — N184 Chronic kidney disease, stage 4 (severe): Secondary | ICD-10-CM | POA: Diagnosis present

## 2017-06-16 DIAGNOSIS — N281 Cyst of kidney, acquired: Secondary | ICD-10-CM | POA: Diagnosis not present

## 2017-06-16 DIAGNOSIS — B1920 Unspecified viral hepatitis C without hepatic coma: Secondary | ICD-10-CM | POA: Diagnosis present

## 2017-06-16 DIAGNOSIS — K7689 Other specified diseases of liver: Secondary | ICD-10-CM | POA: Insufficient documentation

## 2017-06-16 DIAGNOSIS — K740 Hepatic fibrosis: Secondary | ICD-10-CM | POA: Insufficient documentation

## 2017-06-18 ENCOUNTER — Other Ambulatory Visit: Payer: Self-pay | Admitting: Family Medicine

## 2017-06-18 ENCOUNTER — Ambulatory Visit: Payer: BLUE CROSS/BLUE SHIELD | Attending: Family | Admitting: Family

## 2017-06-18 ENCOUNTER — Telehealth: Payer: Self-pay

## 2017-06-18 ENCOUNTER — Encounter: Payer: Self-pay | Admitting: Family

## 2017-06-18 VITALS — BP 128/84 | HR 86 | Resp 18 | Ht 69.0 in | Wt 141.5 lb

## 2017-06-18 DIAGNOSIS — F419 Anxiety disorder, unspecified: Secondary | ICD-10-CM

## 2017-06-18 DIAGNOSIS — I252 Old myocardial infarction: Secondary | ICD-10-CM | POA: Insufficient documentation

## 2017-06-18 DIAGNOSIS — Z9889 Other specified postprocedural states: Secondary | ICD-10-CM | POA: Insufficient documentation

## 2017-06-18 DIAGNOSIS — N184 Chronic kidney disease, stage 4 (severe): Secondary | ICD-10-CM | POA: Diagnosis not present

## 2017-06-18 DIAGNOSIS — Q613 Polycystic kidney, unspecified: Secondary | ICD-10-CM | POA: Insufficient documentation

## 2017-06-18 DIAGNOSIS — Z888 Allergy status to other drugs, medicaments and biological substances status: Secondary | ICD-10-CM | POA: Diagnosis not present

## 2017-06-18 DIAGNOSIS — I420 Dilated cardiomyopathy: Secondary | ICD-10-CM | POA: Diagnosis not present

## 2017-06-18 DIAGNOSIS — Z79899 Other long term (current) drug therapy: Secondary | ICD-10-CM | POA: Insufficient documentation

## 2017-06-18 DIAGNOSIS — Z880 Allergy status to penicillin: Secondary | ICD-10-CM | POA: Diagnosis not present

## 2017-06-18 DIAGNOSIS — I130 Hypertensive heart and chronic kidney disease with heart failure and stage 1 through stage 4 chronic kidney disease, or unspecified chronic kidney disease: Secondary | ICD-10-CM | POA: Insufficient documentation

## 2017-06-18 DIAGNOSIS — Z7982 Long term (current) use of aspirin: Secondary | ICD-10-CM | POA: Insufficient documentation

## 2017-06-18 DIAGNOSIS — J449 Chronic obstructive pulmonary disease, unspecified: Secondary | ICD-10-CM | POA: Insufficient documentation

## 2017-06-18 DIAGNOSIS — Z95810 Presence of automatic (implantable) cardiac defibrillator: Secondary | ICD-10-CM | POA: Insufficient documentation

## 2017-06-18 DIAGNOSIS — G4700 Insomnia, unspecified: Secondary | ICD-10-CM | POA: Diagnosis not present

## 2017-06-18 DIAGNOSIS — I10 Essential (primary) hypertension: Secondary | ICD-10-CM

## 2017-06-18 DIAGNOSIS — E785 Hyperlipidemia, unspecified: Secondary | ICD-10-CM | POA: Diagnosis not present

## 2017-06-18 DIAGNOSIS — I5022 Chronic systolic (congestive) heart failure: Secondary | ICD-10-CM | POA: Insufficient documentation

## 2017-06-18 LAB — BASIC METABOLIC PANEL
ANION GAP: 15 (ref 5–15)
BUN: 99 mg/dL — ABNORMAL HIGH (ref 6–20)
CHLORIDE: 87 mmol/L — AB (ref 101–111)
CO2: 29 mmol/L (ref 22–32)
CREATININE: 3.49 mg/dL — AB (ref 0.61–1.24)
Calcium: 8.7 mg/dL — ABNORMAL LOW (ref 8.9–10.3)
GFR calc non Af Amer: 18 mL/min — ABNORMAL LOW (ref >60)
GFR, EST AFRICAN AMERICAN: 20 mL/min — AB (ref >60)
Glucose, Bld: 137 mg/dL — ABNORMAL HIGH (ref 65–99)
POTASSIUM: 3.6 mmol/L (ref 3.5–5.1)
SODIUM: 131 mmol/L — AB (ref 135–145)

## 2017-06-18 NOTE — Progress Notes (Signed)
Patient ID: Kelly Leonard, male    DOB: Jan 21, 1956, 61 y.o.   MRN: [id number]  HPI  Kelly Leonard is a 61 year old male with a past medical history consisting of anxiety, CKDIII, COPD, HLD, HTN, hepatitis C, hx of MI and chronic heart failure (EF 20-25%).   Most recent echo done on 05/13/17 shows LV EF 20-25% along with moderate MR/TR and moderately elevated PA pressure of 55 mmHg.   Was in the ED 06/14/17 & 06/12/17 for insomnia/anxiety. Both times he was evaluated and released. Was in the ED 06/05/17 due to peripheral edema. He was treated and released.  Admitted 05/29/17 due to HF exacerbation. Initially given IV diuretics and then transitioned back to oral diuretics. Discharged home after 2 days. Admitted 05/12/17 due to HF exacerbation. Initially needed IV lasix and then transitioned to oral lasix. Lost ~3L of fluid while admitted. Cardiology consult obtained. Discharged home after 2 days. Was in the ED 05/02/17 due to HF exacerbation. He was treated and released. Was in the ED 03/15/17 due to HF exacerbation. He was treated and released.   Patient presents for a follow-up visit with a chief complaint of recurrent edema. He describes this as chronic in nature over these last several months with varying levels of severity. He says that a few days ago, he had no swelling. He then went to the gym and got in a whirlpool and steam room and that night, the swelling returned and hasn't gone down yet. He has associated fatigue, shortness of breath, anxiety, weakness and difficulty sleeping. He denies any weight gain, chest pain or dizziness.   Past Medical History:  Diagnosis Date  . Anxiety   . CHF (congestive heart failure) (HCC)   . Chronic systolic congestive heart failure (HCC)   . CKD (chronic kidney disease) stage 4, GFR 15-29 ml/min (HCC)   . COPD (chronic obstructive pulmonary disease) (HCC)   . Dilated cardiomyopathy (HCC)   . Hyperlipidemia   . Hypertension   . MI (myocardial infarction) (HCC)    . Polycystic kidney   . Renal disorder    Past Surgical History:  Procedure Laterality Date  . CARDIAC CATHETERIZATION    . IMPLANTABLE CARDIOVERTER DEFIBRILLATOR (ICD) GENERATOR CHANGE Right 06/23/2015   Procedure: ICD GENERATOR  INITIAL IMPLANT;  Surgeon: Sharion Settler, MD;  Location: [facility] ORS;  Service: Cardiovascular;  Laterality: Right;   Family History  Problem Relation Age of Onset  . Diabetes Mother   . Hypertension Brother   . Heart disease Maternal Grandmother   . Heart attack Maternal Grandmother    Social History  Substance Use Topics  . Smoking status: Never Smoker  . Smokeless tobacco: Never Used  . Alcohol use No   Allergies  Allergen Reactions  . Entresto [Sacubitril-Valsartan] Swelling    Swelling of the face  . Penicillins Anaphylaxis and Swelling    Has patient had a PCN reaction causing immediate rash, facial/tongue/throat swelling, SOB or lightheadedness with hypotension: Yes Has patient had a PCN reaction causing severe rash involving mucus membranes or skin necrosis: No Has patient had a PCN reaction that required hospitalization: No Has patient had a PCN reaction occurring within the last 10 years: No If all of the above answers are "NO", then may proceed with Cephalosporin use.   . Ace Inhibitors Rash  . Carvedilol Other (See Comments)    "dizziness, light headed, and nausea"  . Hydroxyzine Nausea Only  . Seroquel [Quetiapine] Nausea And Vomiting  Hives   . Sertraline Diarrhea    itching  . Trazodone And Nefazodone Other (See Comments)    "up for days"  . Lorazepam Rash    Rash and severe anxiety  . Torsemide Hives, Itching, Nausea And Vomiting and Rash   Prior to Admission medications   Medication Sig Start Date End Date Taking? Authorizing Provider  aspirin EC 81 MG tablet Take 81 mg by mouth daily.   Yes [provider]  furosemide (LASIX) 40 MG tablet Take 160 mg by mouth daily.    Yes [provider]  Melatonin  10 MG CAPS Take 10 mg by mouth at bedtime.   Yes [provider]  metolazone (ZAROXOLYN) 2.5 MG tablet Take 1 tablet (2.5 mg total) by mouth daily as needed. 06/10/17  Yes Antonieta Iba, MD  metoprolol succinate (TOPROL-XL) 25 MG 24 hr tablet Take 25 mg by mouth daily.   Yes [provider]  Multiple Vitamins-Minerals (MULTIVITAMIN WITH MINERALS) tablet Take 1 tablet by mouth daily.   Yes [provider]     Review of Systems  Constitutional: Positive for fatigue. Negative for appetite change.  HENT: Negative for congestion, postnasal drip and sore throat.   Eyes: Negative.   Respiratory: Positive for shortness of breath. Negative for cough and chest tightness.   Cardiovascular: Positive for leg swelling. Negative for chest pain and palpitations.  Gastrointestinal: Positive for abdominal distention. Negative for abdominal pain.  Endocrine: Negative.   Genitourinary: Negative.   Musculoskeletal: Negative for back pain and neck pain.  Skin: Negative.   Allergic/Immunologic: Negative.   Neurological: Positive for weakness. Negative for dizziness and light-headedness.  Hematological: Negative for adenopathy. Does not bruise/bleed easily.  Psychiatric/Behavioral: Positive for sleep disturbance (due to anxiety). Negative for dysphoric mood and suicidal ideas. The patient is nervous/anxious.    Vitals:   06/18/17 1606  BP: 128/84  Pulse: 86  Resp: 18  SpO2: 100%  Weight: 141 lb 8 oz (64.2 kg)  Height:  (1.753 m)   Wt Readings from Last 3 Encounters:  06/18/17 141 lb 8 oz (64.2 kg)  06/14/17 136 lb (61.7 kg)  06/10/17 140 lb 12 oz (63.8 kg)    Lab Results  Component Value Date   CREATININE 3.61 (H) 06/12/2017   CREATININE 4.17 (H) 06/05/2017   CREATININE 3.12 (H) 05/31/2017   Physical Exam  Constitutional: He is oriented to person, place, and time. He appears well-developed and well-nourished.  HENT:  Head: Normocephalic and atraumatic.  Neck:  Normal range of motion. Neck supple. No JVD present.  Cardiovascular: Normal rate and regular rhythm.   Pulmonary/Chest: Effort normal. He has no wheezes. He has no rales.  Abdominal: Soft. He exhibits no distension. There is no tenderness.  Musculoskeletal: He exhibits edema (3+ pitting edema in bilateral lower legs). He exhibits no tenderness.  Neurological: He is alert and oriented to person, place, and time.  Skin: Skin is warm and dry.  Psychiatric: Thought content normal. His mood appears anxious. He is hyperactive.  Nursing note and vitals reviewed.   Assessment and Plan   1. Chronic Heart Failure with Reduced Ejection Fraction  - NYHA class II - continued fluid overload today - Weighing daily; Reminded him to call for an overnight weight gain of >2 pounds or a weekly weight gain of >5 pounds; by our scale, his weight is down 12 pounds from his last visit here - Still using salt on some foods but trying to stay under   sodium/day;   - taking  furosemide BID along with metolazone 2.5mg  twice weekly as needed; has taken it twice this week due to edema - BMP from 06/12/17 reviewed which shows sodium 131, potassium 4.0 and GFR 20 - unable to tolerate entresto as he developed facial swelling - Instructed to elevate legs and to get compression socks on .  - May need to consider spironolactone in the future  - discussed lymphapress compression boots with patient and his wife. Will see how the compression socks & elevation work - advised him to stay out of the steam room and whirlpool at the gym for now. Those extra hot temperatures may be making the edema worse - work note given to excuse him until 06/23/17  2. Hypertension  - BP good today  - Checks BP daily at home - Continue current medications   3: Anxiety- - patient feels like this is the root of his shortness of breath - feels like his anxiety is worsening. Still having difficulty sleeping at night and will either wake  up his wife so that she'll talk with him or walk around the neighborhood.  - patient is pacing or constantly moving around while in the exam room - called PCP Jamesetta Orleans) and got patient an appointment to be seen on 06/20/17 so they can discuss his anxiety further - patient says that he would be interested in counseling or psychiatry if needed  4: CKD stage 4- - renal function remains low - now taking metolazone twice weekly - called nephrologist Thedore Mins) to see if patient can be seen sooner than he is currently scheduled. Appointment made for tomorrow 06/19/17 - will get a BMP today and fax results to nephrologist   Patient did not bring his medications nor a list. Each medication was verbally reviewed with the patient and he was encouraged to bring the bottles to every visit to confirm accuracy of list.  Return in 2 weeks or sooner for any questions/problems before then.

## 2017-06-18 NOTE — Patient Instructions (Addendum)
Continue weighing daily and call for an overnight weight gain of > 2 pounds or a weekly weight gain of >5 pounds.  Follow up with [facility] tomorrow at 9:40 am.

## 2017-06-19 ENCOUNTER — Telehealth: Payer: Self-pay

## 2017-06-19 ENCOUNTER — Telehealth: Payer: Self-pay | Admitting: Family

## 2017-06-19 NOTE — Telephone Encounter (Signed)
Reviewed lab results with patient's wife. Renal function slightly improved from last week although better from 2 weeks' ago. Results were faxed to nephrology office and patient's wife says that they are currently sitting in the nephrologist's waiting room. She was appreciative of the call.

## 2017-06-19 NOTE — Telephone Encounter (Signed)
Patient was advised of results per Dr. Tobi Bastos.   Scheduled appointment with Gloris Manchester

## 2017-06-20 ENCOUNTER — Ambulatory Visit: Payer: BLUE CROSS/BLUE SHIELD | Admitting: Unknown Physician Specialty

## 2017-06-20 NOTE — Telephone Encounter (Signed)
Opened in Error.

## 2017-06-24 DIAGNOSIS — I509 Heart failure, unspecified: Secondary | ICD-10-CM | POA: Insufficient documentation

## 2017-07-04 ENCOUNTER — Emergency Department: Payer: BLUE CROSS/BLUE SHIELD

## 2017-07-04 ENCOUNTER — Emergency Department
Admission: EM | Admit: 2017-07-04 | Discharge: 2017-07-04 | Discharge status: Against Medical Advice | Payer: BLUE CROSS/BLUE SHIELD | Source: Home / Self Care | Attending: Emergency Medicine | Admitting: Emergency Medicine

## 2017-07-04 DIAGNOSIS — I252 Old myocardial infarction: Secondary | ICD-10-CM

## 2017-07-04 DIAGNOSIS — N184 Chronic kidney disease, stage 4 (severe): Secondary | ICD-10-CM | POA: Insufficient documentation

## 2017-07-04 DIAGNOSIS — I5022 Chronic systolic (congestive) heart failure: Secondary | ICD-10-CM

## 2017-07-04 DIAGNOSIS — I130 Hypertensive heart and chronic kidney disease with heart failure and stage 1 through stage 4 chronic kidney disease, or unspecified chronic kidney disease: Secondary | ICD-10-CM

## 2017-07-04 DIAGNOSIS — R0602 Shortness of breath: Secondary | ICD-10-CM

## 2017-07-04 DIAGNOSIS — J449 Chronic obstructive pulmonary disease, unspecified: Secondary | ICD-10-CM | POA: Insufficient documentation

## 2017-07-04 DIAGNOSIS — Z79899 Other long term (current) drug therapy: Secondary | ICD-10-CM | POA: Insufficient documentation

## 2017-07-04 DIAGNOSIS — E871 Hypo-osmolality and hyponatremia: Secondary | ICD-10-CM | POA: Insufficient documentation

## 2017-07-04 DIAGNOSIS — Z7982 Long term (current) use of aspirin: Secondary | ICD-10-CM

## 2017-07-04 LAB — BASIC METABOLIC PANEL
ANION GAP: 16 — AB (ref 5–15)
Anion gap: 16 — ABNORMAL HIGH (ref 5–15)
BUN: 87 mg/dL — ABNORMAL HIGH (ref 6–20)
BUN: 80 mg/dL — AB (ref 6–20)
CHLORIDE: 77 mmol/L — AB (ref 101–111)
CHLORIDE: 86 mmol/L — AB (ref 101–111)
CO2: 24 mmol/L (ref 22–32)
CO2: 21 mmol/L — ABNORMAL LOW (ref 22–32)
Calcium: 9.2 mg/dL (ref 8.9–10.3)
Calcium: 8.1 mg/dL — ABNORMAL LOW (ref 8.9–10.3)
Creatinine, Ser: 3.65 mg/dL — ABNORMAL HIGH (ref 0.61–1.24)
Creatinine, Ser: 3.24 mg/dL — ABNORMAL HIGH (ref 0.61–1.24)
GFR calc Af Amer: 19 mL/min — ABNORMAL LOW (ref >60)
GFR calc Af Amer: 22 mL/min — ABNORMAL LOW (ref >60)
GFR calc non Af Amer: 17 mL/min — ABNORMAL LOW (ref >60)
GFR calc non Af Amer: 19 mL/min — ABNORMAL LOW (ref >60)
GLUCOSE: 104 mg/dL — AB (ref 65–99)
GLUCOSE: 93 mg/dL (ref 65–99)
POTASSIUM: 5.6 mmol/L — AB (ref 3.5–5.1)
POTASSIUM: 4.3 mmol/L (ref 3.5–5.1)
SODIUM: 123 mmol/L — AB (ref 135–145)
Sodium: 117 mmol/L — CL (ref 135–145)

## 2017-07-04 LAB — CBC WITH DIFFERENTIAL/PLATELET
Basophils Absolute: 0 10*3/uL (ref 0–0.1)
Basophils Relative: 1 %
Eosinophils Absolute: 0 10*3/uL (ref 0–0.7)
Eosinophils Relative: 0 %
HCT: 38.7 % — ABNORMAL LOW (ref 40.0–52.0)
HEMOGLOBIN: 12.8 g/dL — AB (ref 13.0–18.0)
LYMPHS ABS: 1.3 10*3/uL (ref 1.0–3.6)
LYMPHS PCT: 17 %
MCH: 25.7 pg — AB (ref 26.0–34.0)
MCHC: 33.1 g/dL (ref 32.0–36.0)
MCV: 77.6 fL — AB (ref 80.0–100.0)
Monocytes Absolute: 0.6 10*3/uL (ref 0.2–1.0)
Monocytes Relative: 8 %
NEUTROS PCT: 74 %
Neutro Abs: 5.7 10*3/uL (ref 1.4–6.5)
Platelets: 358 10*3/uL (ref 150–440)
RBC: 4.99 MIL/uL (ref 4.40–5.90)
RDW: 16.8 % — ABNORMAL HIGH (ref 11.5–14.5)
WBC: 7.6 10*3/uL (ref 3.8–10.6)

## 2017-07-04 LAB — BRAIN NATRIURETIC PEPTIDE: B Natriuretic Peptide: 3665 pg/mL — ABNORMAL HIGH (ref 0.0–100.0)

## 2017-07-04 LAB — TROPONIN I: Troponin I: 0.05 ng/mL (ref <0.03)

## 2017-07-04 MED ORDER — SODIUM CHLORIDE 0.9 % IV BOLUS (SEPSIS)
1000.0000 mL | Freq: Once | INTRAVENOUS | Status: AC
  Administered 2017-07-04: 1000 mL via INTRAVENOUS

## 2017-07-04 NOTE — ED Triage Notes (Signed)
Pt here with sob, pt is labored to breathe in triage, states has been so all day, pt reports recent d/c from [facility] last week for CHF, pt states that his feet started swelling today, pt has been in the 120's for weight for the past week and today was 138lb

## 2017-07-04 NOTE — ED Notes (Addendum)
Pt discussed with EDP with this RN in rm about need for further treatment here in the hospital, pt declines to stay and would like to leave and go home. Pt was given the risks and benefits of staying and leaving the hospital by EDP however pt continues to wish to go home tonight. Pt agrees to sign AMA and is requesting IV to be removed.

## 2017-07-04 NOTE — ED Notes (Signed)
This RN went over EDP's discharge summary with pt which states the reasoning for pt needing to be admitted and change to medication, pt verbalizes understanding and wants to be discharged. Pt in A&O and in NAD at this time

## 2017-07-04 NOTE — Discharge Instructions (Signed)
You are found to have severely low sodium and were recommended to stay in the hospital for further treatment of the low sodium.  We discussed the risk of worsening including seizures and coma and death, but you chose to go home.  I suspect the low sodium is from the new medication Bumex.  Please discontinue this and go back on your previous regimen with Lasix.  You need to call your cardiologist on Monday for reevaluation in terms of your medication management of the congestive heart failure.  You are going to need repeat blood drawn on Monday to recheck your sodium.  Return to the emergency department immediately for any headache, nausea, fatigue, confusion altered mental status, seizure, coma, trouble breathing, chest pain, or any other symptoms concerning to you.

## 2017-07-04 NOTE — ED Provider Notes (Signed)
[facility] Emergency Department Provider Note ____________________________________________   I have reviewed the triage vital signs and the triage nursing note.  HISTORY  Chief Complaint Shortness of Breath   Historian Patient and spouse  HPI Kelly Leonard is a 61 y.o. male with a history of significant CHF, as well as chronic kidney disease, COPD, dilated cardiomyopathy, history of MI, hypertension, presents to the ER with an episode of shortness of breath this afternoon.  He walks a short distance and felt dyspneic and then became acutely anxious.  Patient has had pretty extreme anxiety with respect to chest discomfort or dyspnea given his fairly significant CHF.  He was recently admitted at [facility] for CHF for several days and had changed from Lasix to Bumex.  No chest pain.  No dyspnea now.  Symptoms are resolved.  Symptoms were moderate and are now resolved.   Past Medical History:  Diagnosis Date  . Anxiety   . CHF (congestive heart failure) (HCC)   . Chronic systolic congestive heart failure (HCC)   . CKD (chronic kidney disease) stage 4, GFR 15-29 ml/min (HCC)   . COPD (chronic obstructive pulmonary disease) (HCC)   . Dilated cardiomyopathy (HCC)   . Hyperlipidemia   . Hypertension   . MI (myocardial infarction) (HCC)   . Polycystic kidney   . Renal disorder     Patient Active Problem List   Diagnosis Date Noted  . Acute on chronic systolic CHF (congestive heart failure) (HCC) 05/12/2017  . Insomnia 05/05/2017  . Advanced care planning/counseling discussion 03/24/2017  . 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 Hypertension   . Hyperlipidemia   . Chronic systolic congestive heart failure (HCC)   . Dilated cardiomyopathy (HCC)   . Polycystic kidney   . CKD (chronic kidney disease) stage 4, GFR 15-29 ml/min (HCC)   . 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 Anxiety disorder, unspecified 03/03/2013     Past Surgical History:  Procedure Laterality Date  . CARDIAC CATHETERIZATION    . IMPLANTABLE CARDIOVERTER DEFIBRILLATOR (ICD) GENERATOR CHANGE Right 06/23/2015   Procedure: ICD GENERATOR  INITIAL IMPLANT;  Surgeon: Sharion Settler, MD;  Location: [facility] ORS;  Service: Cardiovascular;  Laterality: Right;    Prior to Admission medications   Medication Sig Start Date End Date Taking? Authorizing Provider  aspirin EC 81 MG tablet Take 81 mg by mouth daily.    [provider]  furosemide (LASIX) 40 MG tablet Take 160 mg by mouth daily.     [provider]  Melatonin 10 MG CAPS Take 10 mg by mouth at bedtime.    [provider]  metolazone (ZAROXOLYN) 2.5 MG tablet Take 1 tablet (2.5 mg total) by mouth daily as needed. 06/10/17   Antonieta Iba, MD  metoprolol succinate (TOPROL-XL) 25 MG 24 hr tablet Take 25 mg by mouth daily.    [provider]  Multiple Vitamins-Minerals (MULTIVITAMIN WITH MINERALS) tablet Take 1 tablet by mouth daily.    [provider]    Allergies  Allergen Reactions  . Entresto [Sacubitril-Valsartan] Swelling    Swelling of the face  . Penicillins Anaphylaxis and Swelling    Has patient had a PCN reaction causing immediate rash, facial/tongue/throat swelling, SOB or lightheadedness with hypotension: Yes Has patient had a PCN reaction causing severe rash involving mucus membranes or skin necrosis: No Has patient had a PCN reaction that required hospitalization: No Has patient had a PCN reaction occurring within the last  10 years: No If all of the above answers are "NO", then may proceed with Cephalosporin use.   . Ace Inhibitors Rash  . Carvedilol Other (See Comments)    "dizziness, light headed, and nausea"  . Hydroxyzine Nausea Only  . Seroquel [Quetiapine] Nausea And Vomiting    Hives   . Sertraline Diarrhea    itching  . Trazodone And Nefazodone Other (See Comments)    "up for days"  . Lorazepam Rash     Rash and severe anxiety  . Torsemide Hives, Itching, Nausea And Vomiting and Rash    Family History  Problem Relation Age of Onset  . Diabetes Mother   . Hypertension Brother   . Heart disease Maternal Grandmother   . Heart attack Maternal Grandmother     Social History Social History  Substance Use Topics  . Smoking status: Never Smoker  . Smokeless tobacco: Never Used  . Alcohol use No    Review of Systems  Constitutional: Negative for fever. Eyes: Negative for visual changes. ENT: Negative for sore throat. Cardiovascular: Negative for chest pain. Respiratory: Positive for episode of shortness of breath. Gastrointestinal: Negative for abdominal pain, vomiting and diarrhea. Genitourinary: Negative for dysuria. Musculoskeletal: Negative for back pain. Skin: Negative for rash. Neurological: Negative for headache.  ____________________________________________   PHYSICAL EXAM:  VITAL SIGNS: ED Triage Vitals  Enc Vitals Group     BP 07/04/17 1722 (!) 184/146     Pulse Rate 07/04/17 1722 87     Resp 07/04/17 1722 (!) 28     Temp --      Temp src --      SpO2 07/04/17 1722 100 %     Weight 07/04/17 1723 138 lb 6.4 oz (62.8 kg)     Height 07/04/17 1723 5\' 9"  (1.753 m)     Head Circumference --      Peak Flow --      Pain Score --      Pain Loc --      Pain Edu? --      Excl. in GC? --      Constitutional: Alert and oriented. Well appearing and in no distress. HEENT   Head: Normocephalic and atraumatic.      Eyes: Conjunctivae are normal. Pupils equal and round.       Ears:         Nose: No congestion/rhinnorhea.   Mouth/Throat: Mucous membranes are moist.   Neck: No stridor. Cardiovascular/Chest: Normal rate, regular rhythm.  No murmurs, rubs, or gallops. Respiratory: Normal respiratory effort without tachypnea nor retractions. Breath sounds are clear and equal bilaterally. No wheezes/rales/rhonchi. Gastrointestinal: Soft. No distention, no  guarding, no rebound. Nontender.    Genitourinary/rectal:Deferred Musculoskeletal: Nontender with normal range of motion in all extremities. No joint effusions.  No lower extremity tenderness.  1+ lower extremity edema bilaterally. Neurologic:  Normal speech and language. No gross or focal neurologic deficits are appreciated. Skin:  Skin is warm, dry and intact. No rash noted. Psychiatric: Mood and affect are normal. Speech and behavior are normal. Patient exhibits appropriate insight and judgment.   ____________________________________________  LABS (pertinent positives/negatives) I, Governor Rooks, MD the attending physician have reviewed the labs noted below.  Labs Reviewed  BASIC METABOLIC PANEL - Abnormal; Notable for the following:       Result Value   Sodium 117 (*)    Potassium 5.6 (*)    Chloride 77 (*)    Glucose, Bld 104 (*)  BUN 87 (*)    Creatinine, Ser 3.65 (*)    GFR calc non Af Amer 17 (*)    GFR calc Af Amer 19 (*)    Anion gap 16 (*)    All other components within normal limits  TROPONIN I - Abnormal; Notable for the following:    Troponin I 0.05 (*)    All other components within normal limits  BRAIN NATRIURETIC PEPTIDE - Abnormal; Notable for the following:    B Natriuretic Peptide 3,665.0 (*)    All other components within normal limits  CBC WITH DIFFERENTIAL/PLATELET - Abnormal; Notable for the following:    Hemoglobin 12.8 (*)    HCT 38.7 (*)    MCV 77.6 (*)    MCH 25.7 (*)    RDW 16.8 (*)    All other components within normal limits  BASIC METABOLIC PANEL - Abnormal; Notable for the following:    Sodium 123 (*)    Chloride 86 (*)    CO2 21 (*)    BUN 80 (*)    Creatinine, Ser 3.24 (*)    Calcium 8.1 (*)    GFR calc non Af Amer 19 (*)    GFR calc Af Amer 22 (*)    Anion gap 16 (*)    All other components within normal limits    ____________________________________________    EKG I, Governor Rooksebecca Sharayah Renfrow, MD, the attending physician have personally  viewed and interpreted all ECGs.  86 bpm.  Normal sinus rhythm.  Left axis deviation.  ST segment depressions laterally  Looks similar to prior EKG ____________________________________________  RADIOLOGY All Xrays were viewed by me.  Imaging interpreted by Radiologist, and I, Governor Rooksebecca Tavious Griesinger, MD the attending physician have reviewed the radiologist interpretation noted below.  Chest x-ray two-view:   IMPRESSION: 1. No acute cardiopulmonary process. 2. Stable cardiomegaly.  __________________________________________  PROCEDURES  Procedure(s) performed: None  Critical Care performed: None  ____________________________________________  No current facility-administered medications on file prior to encounter.    Current Outpatient Prescriptions on File Prior to Encounter  Medication Sig Dispense Refill  . aspirin EC 81 MG tablet Take 81 mg by mouth daily.    . furosemide (LASIX) 40 MG tablet Take 160 mg by mouth daily.     . Melatonin 10 MG CAPS Take 10 mg by mouth at bedtime.    . metolazone (ZAROXOLYN) 2.5 MG tablet Take 1 tablet (2.5 mg total) by mouth daily as needed. 30 tablet 6  . metoprolol succinate (TOPROL-XL) 25 MG 24 hr tablet Take 25 mg by mouth daily.    . Multiple Vitamins-Minerals (MULTIVITAMIN WITH MINERALS) tablet Take 1 tablet by mouth daily.      ____________________________________________  ED COURSE / ASSESSMENT AND PLAN  Pertinent labs & imaging results that were available during my care of the patient were reviewed by me and considered in my medical decision making (see chart for details).    Mr. Karl ItoMcCollum is now essentially symptomatic, a little anxious.  The episode of dyspnea patient was concerned about return of fluid retention with a history of CHF and recent hospitalizations for fluid removal.  Recent change in diuretics.  They were confused about whether or not to be on aspirin or not, on the discharge instructions and one area is said to be  off of aspirin on the other areas that continue.  I discussed with him to go ahead and continue.  As far as the dyspneic episode, patient is asymptomatic now, does not have  any lung findings.  Chest x-ray is clear.  Does have any history or signs for pneumonia.  No chest pain, he does not seem to be retaining fluid.  We discussed checking laboratory studies.  Laboratory studies returned, patient does have elevated BNP similar to prior, he is a baseline borderline elevated troponin similar to prior.  His EKG is similar to prior.  However, he is found to have an acute fairly severe hyponatremia to 117.  He does not have a history of such.  He really is essentially asymptomatic, and because of this he is extremely averse to staying in the hospital for treatment.  I explained to them that most suspicious that the Bumex is responsible for this.  Any case, I am recommending hospital admission for resolution and treatment of the hyponatremia as well as careful fluid management given his history with brittle CHF.  Patient states he really just does not want to stay in the hospital.  He is alert and oriented and capable making his own decisions.  He understands the risk of worsening condition, seizures, coma, and death, and states that he would rather go home than be admitted.  As an alternative, I did discuss with him giving him a liter of normal saline here.  I am a little concerned about providing fluid given history of CHF, however at this point, his hyponatremia is acute and severe.  I also discussed with them that he at this point to go ahead and stop the Bumex.  He has medications to start back on his Lasix which is what he will do for the next 2 days until he can get in with his cardiologist on Monday.  Again I let him know that I am recommending hospital admission and management treatment, patient is going to sign out AGAINST MEDICAL ADVICE.  He knows he can return at any point in time for  treatment.  Repeat metabolic panel shows sodium at 123.  Again patient does not want to stay for further management.  He is to follow-up with his cardiologist and stop the Bumex in the back to his previous diuretic.   CONSULTATIONS:   None   Patient / Family / Caregiver informed of clinical course, medical decision-making process, and agree with plan.   I discussed return precautions, follow-up instructions, and discharge instructions with patient and/or family.  Discharge Instructions : You are found to have severely low sodium and were recommended to stay in the hospital for further treatment of the low sodium.  We discussed the risk of worsening including seizures and coma and death, but you chose to go home.  I suspect the low sodium is from the new medication Bumex.  Please discontinue this and go back on your previous regimen with Lasix.  You need to call your cardiologist on Monday for reevaluation in terms of your medication management of the congestive heart failure.  You are going to need repeat blood drawn on Monday to recheck your sodium.  Return to the emergency department immediately for any headache, nausea, fatigue, confusion altered mental status, seizure, coma, trouble breathing, chest pain, or any other symptoms concerning to you.  ___________________________________________   FINAL CLINICAL IMPRESSION(S) / ED DIAGNOSES   Final diagnoses:  Hyponatremia  Shortness of breath              Note: This dictation was prepared with Dragon dictation. Any transcriptional errors that result from this process are unintentional    Governor Rooks, MD 07/04/17 2332

## 2017-07-04 NOTE — ED Notes (Signed)
Pt present with anxious acting behavior. Pt has a hx of anxiety, pt stats his feet are swelling. RN assessed +4 pitting edema BLE.PT has hx of CHF and has a defibrillator in place.

## 2017-07-05 ENCOUNTER — Inpatient Hospital Stay
Admission: EM | Admit: 2017-07-05 | Discharge: 2017-07-07 | DRG: 640 | Disposition: A | Discharge status: Home / Self Care | Payer: BLUE CROSS/BLUE SHIELD | Attending: Internal Medicine | Admitting: Internal Medicine

## 2017-07-05 ENCOUNTER — Emergency Department: Payer: BLUE CROSS/BLUE SHIELD

## 2017-07-05 ENCOUNTER — Encounter: Payer: Self-pay | Admitting: Emergency Medicine

## 2017-07-05 DIAGNOSIS — E875 Hyperkalemia: Secondary | ICD-10-CM | POA: Diagnosis present

## 2017-07-05 DIAGNOSIS — T501X5A Adverse effect of loop [high-ceiling] diuretics, initial encounter: Secondary | ICD-10-CM | POA: Diagnosis present

## 2017-07-05 DIAGNOSIS — B1920 Unspecified viral hepatitis C without hepatic coma: Secondary | ICD-10-CM | POA: Diagnosis present

## 2017-07-05 DIAGNOSIS — R531 Weakness: Secondary | ICD-10-CM

## 2017-07-05 DIAGNOSIS — N179 Acute kidney failure, unspecified: Secondary | ICD-10-CM | POA: Diagnosis present

## 2017-07-05 DIAGNOSIS — E871 Hypo-osmolality and hyponatremia: Principal | ICD-10-CM | POA: Diagnosis present

## 2017-07-05 DIAGNOSIS — Z95810 Presence of automatic (implantable) cardiac defibrillator: Secondary | ICD-10-CM | POA: Diagnosis not present

## 2017-07-05 DIAGNOSIS — Q613 Polycystic kidney, unspecified: Secondary | ICD-10-CM | POA: Diagnosis not present

## 2017-07-05 DIAGNOSIS — Z833 Family history of diabetes mellitus: Secondary | ICD-10-CM | POA: Diagnosis not present

## 2017-07-05 DIAGNOSIS — I2510 Atherosclerotic heart disease of native coronary artery without angina pectoris: Secondary | ICD-10-CM | POA: Diagnosis present

## 2017-07-05 DIAGNOSIS — I5023 Acute on chronic systolic (congestive) heart failure: Secondary | ICD-10-CM | POA: Diagnosis present

## 2017-07-05 DIAGNOSIS — J449 Chronic obstructive pulmonary disease, unspecified: Secondary | ICD-10-CM | POA: Diagnosis present

## 2017-07-05 DIAGNOSIS — R0602 Shortness of breath: Secondary | ICD-10-CM | POA: Diagnosis present

## 2017-07-05 DIAGNOSIS — F419 Anxiety disorder, unspecified: Secondary | ICD-10-CM | POA: Diagnosis present

## 2017-07-05 DIAGNOSIS — G4700 Insomnia, unspecified: Secondary | ICD-10-CM | POA: Diagnosis present

## 2017-07-05 DIAGNOSIS — I252 Old myocardial infarction: Secondary | ICD-10-CM | POA: Diagnosis not present

## 2017-07-05 DIAGNOSIS — Z8249 Family history of ischemic heart disease and other diseases of the circulatory system: Secondary | ICD-10-CM

## 2017-07-05 DIAGNOSIS — Z7982 Long term (current) use of aspirin: Secondary | ICD-10-CM | POA: Diagnosis not present

## 2017-07-05 DIAGNOSIS — R630 Anorexia: Secondary | ICD-10-CM | POA: Diagnosis not present

## 2017-07-05 DIAGNOSIS — E785 Hyperlipidemia, unspecified: Secondary | ICD-10-CM | POA: Diagnosis present

## 2017-07-05 DIAGNOSIS — I420 Dilated cardiomyopathy: Secondary | ICD-10-CM | POA: Diagnosis present

## 2017-07-05 DIAGNOSIS — I5022 Chronic systolic (congestive) heart failure: Secondary | ICD-10-CM | POA: Diagnosis not present

## 2017-07-05 DIAGNOSIS — N184 Chronic kidney disease, stage 4 (severe): Secondary | ICD-10-CM | POA: Diagnosis present

## 2017-07-05 DIAGNOSIS — Z880 Allergy status to penicillin: Secondary | ICD-10-CM | POA: Diagnosis not present

## 2017-07-05 DIAGNOSIS — I130 Hypertensive heart and chronic kidney disease with heart failure and stage 1 through stage 4 chronic kidney disease, or unspecified chronic kidney disease: Secondary | ICD-10-CM | POA: Diagnosis present

## 2017-07-05 DIAGNOSIS — Z888 Allergy status to other drugs, medicaments and biological substances status: Secondary | ICD-10-CM | POA: Diagnosis not present

## 2017-07-05 DIAGNOSIS — R252 Cramp and spasm: Secondary | ICD-10-CM

## 2017-07-05 LAB — CBC
HEMATOCRIT: 37.8 % — AB (ref 40.0–52.0)
Hemoglobin: 12.3 g/dL — ABNORMAL LOW (ref 13.0–18.0)
MCH: 25 pg — ABNORMAL LOW (ref 26.0–34.0)
MCHC: 32.6 g/dL (ref 32.0–36.0)
MCV: 76.5 fL — AB (ref 80.0–100.0)
Platelets: 425 10*3/uL (ref 150–440)
RBC: 4.95 MIL/uL (ref 4.40–5.90)
RDW: 17.1 % — ABNORMAL HIGH (ref 11.5–14.5)
WBC: 8.8 10*3/uL (ref 3.8–10.6)

## 2017-07-05 LAB — BASIC METABOLIC PANEL
ANION GAP: 18 — AB (ref 5–15)
BUN: 92 mg/dL — ABNORMAL HIGH (ref 6–20)
CHLORIDE: 78 mmol/L — AB (ref 101–111)
CO2: 22 mmol/L (ref 22–32)
Calcium: 8.9 mg/dL (ref 8.9–10.3)
Creatinine, Ser: 3.79 mg/dL — ABNORMAL HIGH (ref 0.61–1.24)
GFR, EST AFRICAN AMERICAN: 18 mL/min — AB (ref >60)
GFR, EST NON AFRICAN AMERICAN: 16 mL/min — AB (ref >60)
Glucose, Bld: 123 mg/dL — ABNORMAL HIGH (ref 65–99)
POTASSIUM: 5 mmol/L (ref 3.5–5.1)
SODIUM: 118 mmol/L — AB (ref 135–145)

## 2017-07-05 LAB — TROPONIN I: Troponin I: 0.06 ng/mL (ref <0.03)

## 2017-07-05 MED ORDER — ACETAMINOPHEN 650 MG RE SUPP: 650.0000 mg | Freq: Four times a day (QID) | RECTAL | Status: DC | PRN

## 2017-07-05 MED ORDER — IPRATROPIUM-ALBUTEROL 0.5-2.5 (3) MG/3ML IN SOLN: 3.0000 mL | Freq: Four times a day (QID) | RESPIRATORY_TRACT | Status: DC | PRN

## 2017-07-05 MED ORDER — ONDANSETRON HCL 4 MG PO TABS: 4.0000 mg | ORAL_TABLET | Freq: Four times a day (QID) | ORAL | Status: DC | PRN

## 2017-07-05 MED ORDER — BISACODYL 10 MG RE SUPP: 10.0000 mg | Freq: Every day | RECTAL | Status: DC | PRN

## 2017-07-05 MED ORDER — SODIUM CHLORIDE 0.9 % IV SOLN
Freq: Once | INTRAVENOUS | Status: AC
  Administered 2017-07-05: 13:00:00 via INTRAVENOUS

## 2017-07-05 MED ORDER — ASPIRIN EC 81 MG PO TBEC
81.0000 mg | DELAYED_RELEASE_TABLET | Freq: Every day | ORAL | Status: DC
  Administered 2017-07-06 – 2017-07-07 (×2): 81 mg via ORAL
  Filled 2017-07-05 (×2): qty 1

## 2017-07-05 MED ORDER — PANTOPRAZOLE SODIUM 40 MG PO TBEC
40.0000 mg | DELAYED_RELEASE_TABLET | Freq: Every day | ORAL | Status: DC
  Administered 2017-07-06 – 2017-07-07 (×3): 40 mg via ORAL
  Filled 2017-07-05 (×2): qty 1

## 2017-07-05 MED ORDER — ONDANSETRON HCL 4 MG/2ML IJ SOLN
4.0000 mg | Freq: Four times a day (QID) | INTRAMUSCULAR | Status: DC | PRN
  Administered 2017-07-06: 4 mg via INTRAVENOUS
  Filled 2017-07-05: qty 2

## 2017-07-05 MED ORDER — METOPROLOL SUCCINATE ER 25 MG PO TB24
25.0000 mg | ORAL_TABLET | Freq: Every day | ORAL | Status: DC
  Administered 2017-07-06 – 2017-07-07 (×2): 25 mg via ORAL
  Filled 2017-07-05 (×2): qty 1

## 2017-07-05 MED ORDER — SODIUM CHLORIDE 0.9 % IV SOLN
INTRAVENOUS | Status: DC
  Administered 2017-07-05: 18:00:00 via INTRAVENOUS

## 2017-07-05 MED ORDER — ACETAMINOPHEN 325 MG PO TABS: 650.0000 mg | ORAL_TABLET | Freq: Four times a day (QID) | ORAL | Status: DC | PRN

## 2017-07-05 MED ORDER — IPRATROPIUM-ALBUTEROL 0.5-2.5 (3) MG/3ML IN SOLN
3.0000 mL | Freq: Four times a day (QID) | RESPIRATORY_TRACT | Status: DC
Start: 2017-07-05 — End: 2017-07-05
  Administered 2017-07-05: 3 mL via RESPIRATORY_TRACT
  Filled 2017-07-05: qty 3

## 2017-07-05 MED ORDER — MELATONIN 5 MG PO TABS
10.0000 mg | ORAL_TABLET | Freq: Every day | ORAL | Status: DC
  Administered 2017-07-05 – 2017-07-06 (×2): 10 mg via ORAL
  Filled 2017-07-05 (×2): qty 2

## 2017-07-05 MED ORDER — DOCUSATE SODIUM 100 MG PO CAPS
100.0000 mg | ORAL_CAPSULE | Freq: Two times a day (BID) | ORAL | Status: DC
  Administered 2017-07-06 – 2017-07-07 (×2): 100 mg via ORAL
  Filled 2017-07-05 (×3): qty 1

## 2017-07-05 MED ORDER — CYCLOBENZAPRINE HCL 10 MG PO TABS
5.0000 mg | ORAL_TABLET | Freq: Three times a day (TID) | ORAL | Status: DC | PRN
  Administered 2017-07-07: 5 mg via ORAL
  Filled 2017-07-05: qty 1

## 2017-07-05 MED ORDER — FUROSEMIDE 10 MG/ML IJ SOLN
60.0000 mg | Freq: Once | INTRAMUSCULAR | Status: AC
  Administered 2017-07-05: 60 mg via INTRAVENOUS
  Filled 2017-07-05: qty 8

## 2017-07-05 MED ORDER — HEPARIN SODIUM (PORCINE) 5000 UNIT/ML IJ SOLN
5000.0000 [IU] | Freq: Three times a day (TID) | INTRAMUSCULAR | Status: DC
  Administered 2017-07-05 – 2017-07-07 (×6): 5000 [IU] via SUBCUTANEOUS
  Filled 2017-07-05 (×6): qty 1

## 2017-07-05 NOTE — Progress Notes (Signed)
Patient received bed placement at [facility]. Declined to be transferred. Md notified. Bed transfers notified.

## 2017-07-05 NOTE — ED Triage Notes (Signed)
Pt arrived via POV from home with reports of increased swelling and shortness of breath. Pt began vomiting upon arrival to ED multiple times in the lobby clear yellow liquid.  Pt seen last night in the ED and had a sodium of 117. Family member states they need to be transferred to [facility].  Pt took 2 Lasix today

## 2017-07-05 NOTE — ED Provider Notes (Signed)
[facility] Emergency Department Provider Note  Time seen: 12:46 PM  I have reviewed the triage vital signs and the nursing notes.   HISTORY  Chief Complaint Emesis; Leg Swelling; and Shortness of Breath    HPI Kelly Leonard is a 61 y.o. male with a complicated past medical history including CHF with a recent EF of 20%, CKD stage IV, COPD, hypertension, hyperlipidemia, MI, anxiety, presents to the emergency department for difficulty breathing muscle aching, cramps and diffuse weakness.  Patient was seen in the emergency department yesterday for the same found to have a sodium of 117 but refused admission at that time was given IV fluids sodium improved to 123 and the patient was discharged home.  Patient states continued with weakness today with worsening shortness of breath so he came back to the emergency department.  Patient states all of his care is at [facility] and he is requesting transfer to [facility].  Denies any chest pain.  Past Medical History:  Diagnosis Date  . Anxiety   . CHF (congestive heart failure) (HCC)   . Chronic systolic congestive heart failure (HCC)   . CKD (chronic kidney disease) stage 4, GFR 15-29 ml/min (HCC)   . COPD (chronic obstructive pulmonary disease) (HCC)   . Dilated cardiomyopathy (HCC)   . Hyperlipidemia   . Hypertension   . MI (myocardial infarction) (HCC)   . Polycystic kidney   . Renal disorder     Patient Active Problem List   Diagnosis Date Noted  . Acute on chronic systolic CHF (congestive heart failure) (HCC) 05/12/2017  . Insomnia 05/05/2017  . Advanced care planning/counseling discussion 03/24/2017  . 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 Hypertension   . Hyperlipidemia   . Chronic systolic congestive heart failure (HCC)   . Dilated cardiomyopathy (HCC)   . Polycystic kidney   . CKD (chronic kidney disease) stage 4, GFR 15-29 ml/min (HCC)   . ICD (implantable cardioverter-defibrillator) in place  06/30/2015  . Anxiety disorder, unspecified 03/03/2013    Past Surgical History:  Procedure Laterality Date  . CARDIAC CATHETERIZATION    . IMPLANTABLE CARDIOVERTER DEFIBRILLATOR (ICD) GENERATOR CHANGE Right 06/23/2015   Procedure: ICD GENERATOR  INITIAL IMPLANT;  Surgeon: Sharion Settler, MD;  Location: [facility] ORS;  Service: Cardiovascular;  Laterality: Right;    Prior to Admission medications   Medication Sig Start Date End Date Taking? Authorizing Provider  aspirin EC 81 MG tablet Take 81 mg by mouth daily.    [provider]  furosemide (LASIX) 40 MG tablet Take 160 mg by mouth daily.     [provider]  Melatonin 10 MG CAPS Take 10 mg by mouth at bedtime.    [provider]  metolazone (ZAROXOLYN) 2.5 MG tablet Take 1 tablet (2.5 mg total) by mouth daily as needed. 06/10/17   Antonieta Iba, MD  metoprolol succinate (TOPROL-XL) 25 MG 24 hr tablet Take 25 mg by mouth daily.    [provider]  Multiple Vitamins-Minerals (MULTIVITAMIN WITH MINERALS) tablet Take 1 tablet by mouth daily.    [provider]    Allergies  Allergen Reactions  . Entresto [Sacubitril-Valsartan] Swelling    Swelling of the face  . Penicillins Anaphylaxis and Swelling    Has patient had a PCN reaction causing immediate rash, facial/tongue/throat swelling, SOB or lightheadedness with hypotension: Yes Has patient had a PCN reaction causing severe rash involving mucus membranes or skin necrosis: No Has patient had a PCN  reaction that required hospitalization: No Has patient had a PCN reaction occurring within the last 10 years: No If all of the above answers are "NO", then may proceed with Cephalosporin use.   . Ace Inhibitors Rash  . Carvedilol Other (See Comments)    "dizziness, light headed, and nausea"  . Hydroxyzine Nausea Only  . Seroquel [Quetiapine] Nausea And Vomiting    Hives   . Sertraline Diarrhea    itching  . Trazodone And Nefazodone Other  (See Comments)    "up for days"  . Lorazepam Rash    Rash and severe anxiety  . Torsemide Hives, Itching, Nausea And Vomiting and Rash    Family History  Problem Relation Age of Onset  . Diabetes Mother   . Hypertension Brother   . Heart disease Maternal Grandmother   . Heart attack Maternal Grandmother     Social History Social History  Substance Use Topics  . Smoking status: Never Smoker  . Smokeless tobacco: Never Used  . Alcohol use No    Review of Systems Constitutional: Negative for fever.  Positive for generalized weakness.  Positive for anxiety per patient. Cardiovascular: Negative for chest pain. Respiratory: Positive for shortness of breath worse with exertion. Gastrointestinal: Negative for abdominal pain.  Nausea with one episode of vomiting today.  Denies diarrhea. Neurological: Negative for headaches, focal weakness or numbness.  Positive for generalized weakness. All other ROS negative  ____________________________________________   PHYSICAL EXAM:  VITAL SIGNS: ED Triage Vitals  Enc Vitals Group     BP --      Pulse --      Resp --      Temp --      Temp src --      SpO2 --      Weight 07/05/17 1200 138 lb (62.6 kg)     Height 07/05/17 1200 5\' 9"  (1.753 m)     Head Circumference --      Peak Flow --      Pain Score 07/05/17 1158 0     Pain Loc --      Pain Edu? --      Excl. in GC? --     Constitutional: Alert and oriented. Well appearing and in no distress. Eyes: Normal exam ENT   Head: Normocephalic and atraumatic.   Mouth/Throat: Mucous membranes are moist. Cardiovascular: Normal rate, regular rhythm. No murmur Respiratory: Normal respiratory effort without tachypnea nor retractions. Breath sounds are clear  Gastrointestinal: Soft and nontender. No distention. Musculoskeletal: Nontender with normal range of motion in all extremities.  Neurologic:  Normal speech and language. No gross focal neurologic deficits Skin:  Skin is  warm, dry and intact.  Psychiatric: Mood and affect are normal  ____________________________________________    EKG  EKG reviewed and interpreted by myself shows normal sinus rhythm at 92 bpm, narrow QRS, normal axis, largely normal intervals besides PR prolongation, nonspecific ST changes.  EKG unchanged from yesterday 07/04/17.  ____________________________________________    RADIOLOGY  X-ray negative  ____________________________________________   INITIAL IMPRESSION / ASSESSMENT AND PLAN / ED COURSE  Pertinent labs & imaging results that were available during my care of the patient were reviewed by me and considered in my medical decision making (see chart for details).  Patient presents to the emergency department for generalized fatigue, weakness, leg cramps, shortness of breath with exertion.  Patient was seen in the ER yesterday with a sodium of 117, encouraged to be admitted to the hospital but refused.  He returns today for similar symptoms but is willing to stay, but is requesting transfer to [facility].  Differential at this time would include continued hyponatremia, CHF exacerbation, pulmonary edema, electrolyte abnormality such as hyperkalemia, dehydration.  We will recheck labs, chest x-ray from yesterday is negative.  We will continue to closely monitor in the emergency department.  Labs have resulted with sodium 118 and a creatinine of 3.79.  Creatinine is largely unchanged from baseline, baseline sodium is around 130-135.  We will begin IV fluids.  Once the rest of the workup has resulted we will discuss with [facility] for transfer due to patient preference and complicated medical treatment at a facility familiar with the patient.  Troponin 0.06.  X-ray largely unchanged.  I suspect the patient's hyponatremia is likely due to diuresis and then stopping his Lasix now leading to fluid accumulation.  Patient restarted the Lasix today.   CRITICAL CARE Performed by:  Minna Antis   Total critical care time: 30 minutes  Critical care time was exclusive of separately billable procedures and treating other patients.  Critical care was necessary to treat or prevent imminent or life-threatening deterioration.  Critical care was time spent personally by me on the following activities: development of treatment plan with patient and/or surrogate as well as nursing, discussions with consultants, evaluation of patient's response to treatment, examination of patient, obtaining history from patient or surrogate, ordering and performing treatments and interventions, ordering and review of laboratory studies, ordering and review of radiographic studies, pulse oximetry and re-evaluation of patient's condition.  I discussed the patient with cardiology, they actually spoke to the patient over the phone today and encouraged him to come to the ER but he cannot make it to [facility] so he came to [facility].  They are accepting the patient to cardiology for further treatment.  Recommend beginning Lasix we will also infuse normal saline at 100 mL/h x 1 L.  They are currently full but states they will prioritize this patient.   ____________________________________________   FINAL CLINICAL IMPRESSION(S) / ED DIAGNOSES  Hyponatremia Generalized fatigue/weakness Dehydration    Minna Antis, MD 07/05/17 1308

## 2017-07-05 NOTE — H&P (Signed)
History and Physical    Chay Mazzoni ZOX:[id number] DOB: 1956-09-07 DOA: 07/05/2017  Referring physician: Dr. Derrill Kay PCP: Gabriel Cirri, NP  Specialists: none  Chief Complaint: weakness  HPI: Kelly Leonard is a 61 y.o. male has a past medical history significant for CAD, CHF, anxiety, and HTN and CKD now with progressive weakness and muscle cramps. Has been on increased diuretics for CHF recently. Has chronic SOB and DOE. In ER, pt's sodium was 118. He is now admitted. No fever. Denies CP or palpitations. No syncope on near syncope. Very anxious. Does admit to chronic LE edema  Review of Systems: The patient denies anorexia, fever, weight loss,, vision loss, decreased hearing, hoarseness, chest pain, syncope, balance deficits, hemoptysis, abdominal pain, melena, hematochezia, severe indigestion/heartburn, hematuria, incontinence, genital sores, suspicious skin lesions, transient blindness, difficulty walking, depression, unusual weight change, abnormal bleeding, enlarged lymph nodes, angioedema, and breast masses.   Past Medical History:  Diagnosis Date  . Anxiety   . CHF (congestive heart failure) (HCC)   . Chronic systolic congestive heart failure (HCC)   . CKD (chronic kidney disease) stage 4, GFR 15-29 ml/min (HCC)   . COPD (chronic obstructive pulmonary disease) (HCC)   . Dilated cardiomyopathy (HCC)   . Hyperlipidemia   . Hypertension   . MI (myocardial infarction) (HCC)   . Polycystic kidney   . Renal disorder    Past Surgical History:  Procedure Laterality Date  . CARDIAC CATHETERIZATION    . IMPLANTABLE CARDIOVERTER DEFIBRILLATOR (ICD) GENERATOR CHANGE Right 06/23/2015   Procedure: ICD GENERATOR  INITIAL IMPLANT;  Surgeon: Sharion Settler, MD;  Location: [facility] ORS;  Service: Cardiovascular;  Laterality: Right;   Social History:  reports that he has never smoked. He has never used smokeless tobacco. He reports that he does not drink alcohol or use  drugs.  Allergies  Allergen Reactions  . Entresto [Sacubitril-Valsartan] Swelling    Swelling of the face  . Penicillins Anaphylaxis and Swelling    Has patient had a PCN reaction causing immediate rash, facial/tongue/throat swelling, SOB or lightheadedness with hypotension: Yes Has patient had a PCN reaction causing severe rash involving mucus membranes or skin necrosis: No Has patient had a PCN reaction that required hospitalization: No Has patient had a PCN reaction occurring within the last 10 years: No If all of the above answers are "NO", then may proceed with Cephalosporin use.   . Ace Inhibitors Rash  . Carvedilol Other (See Comments)    "dizziness, light headed, and nausea"  . Hydroxyzine Nausea Only  . Seroquel [Quetiapine] Nausea And Vomiting    Hives   . Sertraline Diarrhea    itching  . Trazodone And Nefazodone Other (See Comments)    "up for days"  . Lorazepam Rash    Rash and severe anxiety  . Torsemide Hives, Itching, Nausea And Vomiting and Rash    Family History  Problem Relation Age of Onset  . Diabetes Mother   . Hypertension Brother   . Heart disease Maternal Grandmother   . Heart attack Maternal Grandmother     Prior to Admission medications   Medication Sig Start Date End Date Taking? Authorizing Provider  aspirin EC 81 MG tablet Take 81 mg by mouth daily.   Yes [provider]  b complex vitamins tablet Take 1 tablet by mouth daily.   Yes [provider]  Melatonin 10 MG CAPS Take 10 mg by mouth at bedtime.   Yes [provider]  metoprolol succinate (TOPROL-XL)  25 MG 24 hr tablet Take 25 mg by mouth daily.   Yes [provider]  Multiple Vitamins-Minerals (MULTIVITAMIN WITH MINERALS) tablet Take 1 tablet by mouth daily.   Yes [provider]  metolazone (ZAROXOLYN) 2.5 MG tablet Take 1 tablet (2.5 mg total) by mouth daily as needed. Patient not taking: Reported on 07/05/2017 06/10/17   Antonieta IbaGollan, Timothy J,  MD   Physical Exam: Vitals:   07/05/17 1200 07/05/17 1430  BP:  (!) 131/93  Resp:  (!) 27  Weight: 62.6 kg (138 lb)   Height: 5\' 9"  (1.753 m)      General:  No apparent distress, WDWN, [place]/AT  Eyes: PERRL, EOMI, no scleral icterus, conjunctiva clear  ENT: moist oropharynx without exudate, TM's benign, dentition fair  Neck: supple, no lymphadenopathy. No bruits or thyromegaly  Cardiovascular: regular rate without MRG; 2+ peripheral pulses, no JVD, 1+ peripheral edema  Respiratory: CTA biL, good air movement without wheezing, rhonchi or crackled. Respiratory effort normal  Abdomen: soft, non tender to palpation, positive bowel sounds, no guarding, no rebound  Skin: no rashes or lesions  Musculoskeletal: normal bulk and tone, no joint swelling  Psychiatric: normal mood and affect, A&OX3  Neurologic: CN 2-12 grossly intact, Motor strength 5/5 in all 4 groups with symmetric DTR's and non-focal sensory exam  Labs on Admission:  Basic Metabolic Panel:  Recent Labs Lab 07/04/17 1932 07/04/17 2226 07/05/17 1207  NA 117* 123* 118*  K 5.6* 4.3 5.0  CL 77* 86* 78*  CO2 24 21* 22  GLUCOSE 104* 93 123*  BUN 87* 80* 92*  CREATININE 3.65* 3.24* 3.79*  CALCIUM 9.2 8.1* 8.9   Liver Function Tests: No results for input(s): AST, ALT, ALKPHOS, BILITOT, PROT, ALBUMIN in the last 168 hours. No results for input(s): LIPASE, AMYLASE in the last 168 hours. No results for input(s): AMMONIA in the last 168 hours. CBC:  Recent Labs Lab 07/04/17 1932 07/05/17 1207  WBC 7.6 8.8  NEUTROABS 5.7  --   HGB 12.8* 12.3*  HCT 38.7* 37.8*  MCV 77.6* 76.5*  PLT 358 425   Cardiac Enzymes:  Recent Labs Lab 07/04/17 1932 07/05/17 1207  TROPONINI 0.05* 0.06*    BNP (last 3 results)  Recent Labs  05/29/17 2152 06/05/17 1537 07/04/17 1932  BNP 3,608.0* 4,465.0* 3,665.0*    ProBNP (last 3 results) No results for input(s): PROBNP in the last 8760 hours.  CBG: No results for  input(s): GLUCAP in the last 168 hours.  Radiological Exams on Admission: Dg Chest 2 View  Result Date: 07/05/2017 CLINICAL DATA:  Shortness of breath EXAM: CHEST  2 VIEW COMPARISON:  One day prior FINDINGS: Chronic cardiomegaly. Single chamber ICD/ pacer from the left. EKG leads create artifact over the chest. There is no edema, consolidation, effusion, or pneumothorax. IMPRESSION: 1. No evidence of active disease. 2. Cardiomegaly. Electronically Signed   By: Marnee SpringJonathon  Watts M.D.   On: 07/05/2017 12:56   Dg Chest 2 View  Result Date: 07/04/2017 CLINICAL DATA:  61 year old male with shortness of breath. EXAM: CHEST  2 VIEW COMPARISON:  Chest radiograph dated 06/12/2017 FINDINGS: There is stable mild cardiomegaly. No vascular congestion or edema. There is a single lead left pectoral AICD device. No focal consolidation, pleural effusion, or pneumothorax. No acute osseous pathology. IMPRESSION: 1. No acute cardiopulmonary process. 2. Stable cardiomegaly. Electronically Signed   By: Elgie CollardArash  Radparvar M.D.   On: 07/04/2017 18:41    EKG: Independently reviewed.  Assessment/Plan Principal Problem:  Hyponatremia Active Problems:   Chronic systolic congestive heart failure (HCC)   Cramps, muscle, general   Weakness generalized   Will admit to floor with IV NS and back off diuretics. Monitor volume status/respiratory status closely. Consult Cardiology. Repeat labs in AM  Diet: low salt Fluids: NS@75  DVT Prophylaxis: SQ Heparin  Code Status: FULL  Family Communication: yes  Disposition Plan: home  Time spent: 50 min

## 2017-07-05 NOTE — ED Notes (Signed)
Patient appears restless, MD Notified

## 2017-07-06 DIAGNOSIS — I5022 Chronic systolic (congestive) heart failure: Secondary | ICD-10-CM

## 2017-07-06 DIAGNOSIS — R531 Weakness: Secondary | ICD-10-CM

## 2017-07-06 DIAGNOSIS — F419 Anxiety disorder, unspecified: Secondary | ICD-10-CM

## 2017-07-06 DIAGNOSIS — G4700 Insomnia, unspecified: Secondary | ICD-10-CM

## 2017-07-06 DIAGNOSIS — E871 Hypo-osmolality and hyponatremia: Principal | ICD-10-CM

## 2017-07-06 DIAGNOSIS — I420 Dilated cardiomyopathy: Secondary | ICD-10-CM

## 2017-07-06 DIAGNOSIS — R630 Anorexia: Secondary | ICD-10-CM

## 2017-07-06 LAB — COMPREHENSIVE METABOLIC PANEL
ALBUMIN: 3.4 g/dL — AB (ref 3.5–5.0)
ALT: 79 U/L — ABNORMAL HIGH (ref 17–63)
ANION GAP: 19 — AB (ref 5–15)
AST: 146 U/L — ABNORMAL HIGH (ref 15–41)
Alkaline Phosphatase: 60 U/L (ref 38–126)
BUN: 95 mg/dL — ABNORMAL HIGH (ref 6–20)
CALCIUM: 9.3 mg/dL (ref 8.9–10.3)
CHLORIDE: 81 mmol/L — AB (ref 101–111)
CO2: 22 mmol/L (ref 22–32)
Creatinine, Ser: 3.96 mg/dL — ABNORMAL HIGH (ref 0.61–1.24)
GFR calc non Af Amer: 15 mL/min — ABNORMAL LOW (ref >60)
GFR, EST AFRICAN AMERICAN: 18 mL/min — AB (ref >60)
GLUCOSE: 100 mg/dL — AB (ref 65–99)
POTASSIUM: 5.5 mmol/L — AB (ref 3.5–5.1)
SODIUM: 122 mmol/L — AB (ref 135–145)
Total Bilirubin: 1.6 mg/dL — ABNORMAL HIGH (ref 0.3–1.2)
Total Protein: 7.3 g/dL (ref 6.5–8.1)

## 2017-07-06 LAB — SODIUM
SODIUM: 123 mmol/L — AB (ref 135–145)
SODIUM: 118 mmol/L — AB (ref 135–145)
SODIUM: 124 mmol/L — AB (ref 135–145)

## 2017-07-06 LAB — CBC
HCT: 37.3 % — ABNORMAL LOW (ref 40.0–52.0)
Hemoglobin: 11.9 g/dL — ABNORMAL LOW (ref 13.0–18.0)
MCH: 24.8 pg — ABNORMAL LOW (ref 26.0–34.0)
MCHC: 31.9 g/dL — AB (ref 32.0–36.0)
MCV: 77.7 fL — ABNORMAL LOW (ref 80.0–100.0)
Platelets: 418 10*3/uL (ref 150–440)
RBC: 4.8 MIL/uL (ref 4.40–5.90)
RDW: 16.8 % — AB (ref 11.5–14.5)
WBC: 8.3 10*3/uL (ref 3.8–10.6)

## 2017-07-06 LAB — GLUCOSE, CAPILLARY: Glucose-Capillary: 99 mg/dL (ref 65–99)

## 2017-07-06 MED ORDER — DEXTROSE 50 % IV SOLN
25.0000 mL | Freq: Once | INTRAVENOUS | Status: AC
  Administered 2017-07-06: 25 mL via INTRAVENOUS
  Filled 2017-07-06: qty 50

## 2017-07-06 MED ORDER — FUROSEMIDE 20 MG PO TABS
20.0000 mg | ORAL_TABLET | Freq: Once | ORAL | Status: AC
  Administered 2017-07-06: 20 mg via ORAL
  Filled 2017-07-06: qty 1

## 2017-07-06 MED ORDER — BUMETANIDE 1 MG PO TABS
1.0000 mg | ORAL_TABLET | Freq: Two times a day (BID) | ORAL | Status: DC
  Administered 2017-07-06: 1 mg via ORAL
  Filled 2017-07-06 (×2): qty 1

## 2017-07-06 MED ORDER — INSULIN ASPART 100 UNIT/ML IV SOLN
10.0000 [IU] | Freq: Once | INTRAVENOUS | Status: AC
  Administered 2017-07-06: 10 [IU] via INTRAVENOUS
  Filled 2017-07-06: qty 0.1

## 2017-07-06 NOTE — Consult Note (Addendum)
Cardiology Consultation:   Patient ID: Kelly PolioMichael Trusty; [id number]; [birth year]   Admit date: 07/05/2017 Date of Consult: 07/06/2017  Primary Care Provider: Gabriel CirriWicker, Cheryl, NP [facility]  Physician requesting consult: Dr. Aletha HalimJeff Sparks Reason for consult: Hyponatremia, chronic systolic CHF   Patient Profile:   Kelly Leonard is a 61 y.o. male with a hx of nonischemic cardiomyopathy, frequent hospital admissions for acute on chronic systolic CHF , who is being seen today for the evaluation of hyponatremia, confusion  History of Present Illness:   Kelly Leonard has a history of Severe anxiety/insomnia Chronic kidney disease, Suspected cardiorenal syndrome , Dr. Thedore MinsSingh 07/2017 COPD Hyperlipidemia Hypertension Chronic systolic CHF Ejection fraction 20% Normal coronary arteries by cardiac catheterization in the past by report Previous ICD placement [facility] Seen once in our clinic several weeks ago for acute on chronic systolic CHF after being [facility]   [facility]  Reports he recently got out of the hospital approximately 1 week ago Was discharged on Bumex 3 mg twice daily Reports his weight was 124 pounds when he got home (appeared to be 127 pounds in the hospital), On the Bumex 3 twice daily he had additional weight loss reports having weight down to 120 pounds  (home scale) Held the Bumex for 1 day then restarted Bumex Presented to the emergency room 2 days ago for confusion, agitation, hyponatremia found on lab work, 117 He was given IV fluids and refused admission, repeat sodium up [place] 123 [place], took Lasix for 1 day and re-presented to the emergency room for continued GI upset, confusion Sodium dropped back to 118, he allowed admission to the hospital, again was placed on IV fluids by hospitalist service Two weights in the computer now report weight of 138 pounds, standing scale He  also confirms higher weight at home prior to admission, wife is at the bedside  He was seen by myself in clinic June 10, 2017, Reported that he felt well, weight was 134 pounds, felt close to his dry weight, was on Lasix with metolazone as needed [place] of breath, insomnia, Was discharged home Emergency room June 14, 2017 again presenting for insomnia to the [place] [facility], urgent care [facility] 2017 for acute on chronic systolic CHF Emergency room October 26 for low sodium and confusion Emergency room October 27 for low sodium and confusion  Other past medical history reviewed Seen in emergency room 06/05/2017 for severe leg edema, shortness of breath Started on metoprolol, on metolazone Lasix 80 mg twice a day  He did not tolerate torsemide, cause her found erythematous rash He has tried numerous medications for insomnia and agitation at night including some antipsychotics which had the reverse effect and caused worsening insomnia   echo done on 05/13/17 shows LV EF 20-25% along with moderate MR/TR and moderately elevated PA pressure of 55 mmHg.     Other past medical history reviewed with him   Admitted 9/20/18due to HF exacerbation.   given IV diuretics and then transitioned back to oral diuretics.  Discharged home after 2 days.   Admitted 05/12/17 due to HF exacerbation.  needed IV lasix and then transitioned to oral lasix.  Lost ~3L of fluid while admitted.  Discharged home after 2 days.    ED 8/24/18due to HF exacerbation. He was treated and released.    ED7/7/18 due to HF exacerbation. He was treated  and released.    Past Medical History:  Diagnosis Date  . Anxiety   . CHF (congestive heart failure) (HCC)   . Chronic systolic congestive heart failure (HCC)   . CKD (chronic kidney disease) stage 4, GFR 15-29 ml/min (HCC)   . COPD (chronic obstructive pulmonary disease) (HCC)   . Dilated  cardiomyopathy (HCC)   . Hyperlipidemia   . Hypertension   . MI (myocardial infarction) (HCC)   . Polycystic kidney   . Renal disorder     Past Surgical History:  Procedure Laterality Date  . CARDIAC CATHETERIZATION    . IMPLANTABLE CARDIOVERTER DEFIBRILLATOR (ICD) GENERATOR CHANGE Right 06/23/2015   Procedure: ICD GENERATOR  INITIAL IMPLANT;  Surgeon: Sharion Settler, MD;  Location: [facility] ORS;  Service: Cardiovascular;  Laterality: Right;     Home Medications:  Prior to Admission medications   Medication Sig Start Date End Date Taking? Authorizing Provider  aspirin EC 81 MG tablet Take 81 mg by mouth daily.   Yes [provider]  b complex vitamins tablet Take 1 tablet by mouth daily.   Yes [provider]  Melatonin 10 MG CAPS Take 10 mg by mouth at bedtime.   Yes [provider]  metoprolol succinate (TOPROL-XL) 25 MG 24 hr tablet Take 25 mg by mouth daily.   Yes [provider]  Multiple Vitamins-Minerals (MULTIVITAMIN WITH MINERALS) tablet Take 1 tablet by mouth daily.   Yes [provider]  metolazone (ZAROXOLYN) 2.5 MG tablet Take 1 tablet (2.5 mg total) by mouth daily as needed. Patient not taking: Reported on 07/05/2017 06/10/17   Antonieta Iba, MD    Inpatient Medications: Scheduled Meds: . aspirin EC  81 mg Oral Daily  . bumetanide  1 mg Oral BID  . docusate sodium  100 mg Oral BID  . heparin  5,000 Units Subcutaneous Q8H  . Melatonin  10 mg Oral QHS  . metoprolol succinate  25 mg Oral Daily  . pantoprazole  40 mg Oral Daily   Continuous Infusions:  PRN Meds: acetaminophen **OR** acetaminophen, bisacodyl, cyclobenzaprine, ipratropium-albuterol, ondansetron **OR** ondansetron (ZOFRAN) IV  Allergies:    Allergies  Allergen Reactions  . Entresto [Sacubitril-Valsartan] Swelling    Swelling of the face  . Penicillins Anaphylaxis and Swelling    Has patient had a PCN reaction causing immediate rash,  facial/tongue/throat swelling, SOB or lightheadedness with hypotension: Yes Has patient had a PCN reaction causing severe rash involving mucus membranes or skin necrosis: No Has patient had a PCN reaction that required hospitalization: No Has patient had a PCN reaction occurring within the last 10 years: No If all of the above answers are "NO", then may proceed with Cephalosporin use.   . Ace Inhibitors Rash  . Carvedilol Other (See Comments)    "dizziness, light headed, and nausea"  . Hydroxyzine Nausea Only  . Seroquel [Quetiapine] Nausea And Vomiting    Hives   . Sertraline Diarrhea    itching  . Trazodone And Nefazodone Other (See Comments)    "up for days"  . Lorazepam Rash    Rash and severe anxiety  . Torsemide Hives, Itching, Nausea And Vomiting and Rash    Social History:   Social History   Social History  . Marital status: Single    Spouse name: N/A  . Number of children: N/A  . Years of education: N/A   Occupational History  . Not on file.   Social History Main Topics  . Smoking status:  Never Smoker  . Smokeless tobacco: Never Used  . Alcohol use No  . Drug use: No  . Sexual activity: Yes   Other Topics Concern  . Not on file   Social History Narrative  . No narrative on file    Family History:    Family History  Problem Relation Age of Onset  . Diabetes Mother   . Hypertension Brother   . Heart disease Maternal Grandmother   . Heart attack Maternal Grandmother      ROS:  Please see the history of present illness.  Review of Systems  Constitution: Negative for diaphoresis, fever, weakness, malaise/fatigue and night sweats.       Confusion  HENT: Negative.   Eyes: Negative.   Cardiovascular: Negative for chest pain, claudication, cyanosis, dyspnea on exertion, irregular heartbeat, leg swelling, near-syncope, orthopnea, palpitations and paroxysmal nocturnal dyspnea.  Respiratory: Negative for cough, shortness of breath, sleep disturbances due  to breathing and wheezing.   Endocrine: Negative.   Hematologic/Lymphatic: Negative.   Skin: Negative.   Musculoskeletal: Negative for falls, joint pain, joint swelling and myalgias.  Gastrointestinal: Positive for nausea and vomiting.  Neurological: Negative for difficulty with concentration, excessive daytime sleepiness, dizziness, focal weakness, light-headedness and numbness.  Psychiatric/Behavioral: Negative.   All other ROS reviewed and negative.     Physical Exam/Data:   Vitals:   07/05/17 2052 07/06/17 0403 07/06/17 0408 07/06/17 0805  BP: (!) 150/99 (!) 141/102 129/86 131/82  Pulse: 84 92 89 92  Resp: 18 18  16   Temp:  (!) 97.5 F (36.4 C)  (!) 97.4 F (36.3 C)  TempSrc:  Oral  Oral  SpO2:  100%  100%  Weight:   138 lb 9.6 oz (62.9 kg)   Height:        Intake/Output Summary (Last 24 hours) at 07/06/17 1457 Last data filed at 07/06/17 1405  Gross per 24 hour  Intake              385 ml  Output              950 ml  Net             -565 ml   Filed Weights   07/05/17 1200 07/06/17 0408  Weight: 138 lb (62.6 kg) 138 lb 9.6 oz (62.9 kg)   Body mass index is 20.47 kg/m.  General:  Well nourished, well developed, in no acute distress HEENT: normal Lymph: no adenopathy Neck: no JVD Endocrine:  No thryomegaly Vascular: No carotid bruits; FA pulses 2+ bilaterally without bruits  Cardiac:  normal S1, S2; RRR; no murmur  Lungs:  clear to auscultation bilaterally, no wheezing, rhonchi or rales  Abd: soft, nontender, no hepatomegaly  Ext: Trace edema around the ankles and tops of his feet bilaterally Musculoskeletal:  No deformities, BUE and BLE strength normal and equal Skin: warm and dry  Neuro:  CNs 2-12 intact, no focal abnormalities noted Psych:  Normal affect   EKG:  The EKG was personally reviewed and demonstrates:  EKG personally reviewed by myself on todays visit Shows normal sinus rhythm with  nonspecific ST and T wave abnormality V5, V6, left axis  deviation  Telemetry:  Telemetry was personally reviewed and demonstrates: Normal sinus rhythm  Relevant CV Studies:   Laboratory Data:  Chemistry Recent Labs Lab 07/04/17 2226 07/05/17 1207 07/06/17 0433 07/06/17 0925  NA 123* 118* 122* 123*  K 4.3 5.0 5.5*  --   CL 86* 78* 81*  --  CO2 21* 22 22  --   GLUCOSE 93 123* 100*  --   BUN 80* 92* 95*  --   CREATININE 3.24* 3.79* 3.96*  --   CALCIUM 8.1* 8.9 9.3  --   GFRNONAA 19* 16* 15*  --   GFRAA 22* 18* 18*  --   ANIONGAP 16* 18* 19*  --      Recent Labs Lab 07/06/17 0433  PROT 7.3  ALBUMIN 3.4*  AST 146*  ALT 79*  ALKPHOS 60  BILITOT 1.6*   Hematology Recent Labs Lab 07/04/17 1932 07/05/17 1207 07/06/17 0433  WBC 7.6 8.8 8.3  RBC 4.99 4.95 4.80  HGB 12.8* 12.3* 11.9*  HCT 38.7* 37.8* 37.3*  MCV 77.6* 76.5* 77.7*  MCH 25.7* 25.0* 24.8*  MCHC 33.1 32.6 31.9*  RDW 16.8* 17.1* 16.8*  PLT 358 425 418   Cardiac Enzymes Recent Labs Lab 07/04/17 1932 07/05/17 1207  TROPONINI 0.05* 0.06*   No results for input(s): TROPIPOC in the last 168 hours.  BNP Recent Labs Lab 07/04/17 1932  BNP 3,665.0*    DDimer No results for input(s): DDIMER in the last 168 hours.  Radiology/Studies:  Dg Chest 2 View  Result Date: 07/05/2017 CLINICAL DATA:  Shortness of breath EXAM: CHEST  2 VIEW COMPARISON:  One day prior FINDINGS: Chronic cardiomegaly. Single chamber ICD/ pacer from the left. EKG leads create artifact over the chest. There is no edema, consolidation, effusion, or pneumothorax. IMPRESSION: 1. No evidence of active disease. 2. Cardiomegaly. Electronically Signed   By: Marnee Spring M.D.   On: 07/05/2017 12:56   Dg Chest 2 View  Result Date: 07/04/2017 CLINICAL DATA:  60 year old male with shortness of breath. EXAM: CHEST  2 VIEW COMPARISON:  Chest radiograph dated 06/12/2017 FINDINGS: There is stable mild cardiomegaly. No vascular congestion or edema. There is a single lead left pectoral AICD device.  No focal consolidation, pleural effusion, or pneumothorax. No acute osseous pathology. IMPRESSION: 1. No acute cardiopulmonary process. 2. Stable cardiomegaly. Electronically Signed   By: Elgie Collard M.D.   On: 07/04/2017 18:41    Assessment and Plan:   1. Hyponatremia He received IV fluids in the emergency room 2 days ago and again yesterday Reports now having swelling up to below the knees Recent discharge from [facility] with Bumex 3 twice daily, reports compliance with his medications Seen by nephrology this morning, restarted on low-dose Bumex 1 mg twice daily IV fluids have been held --Rather than IV fluids, consider adding Tolvaptan.  His fluid status is very sensitive and high risk of recurrent acute on chronic systolic CHF, already with edema to below the knees  2) Chronic systolic CHF Frequent hospital admissions and evaluations in the emergency room Severely depressed ejection fraction, nonischemic Followed by [facility] CHF clinic, Recent discharge 1 week ago Changed from Lasix and metolazone to Bumex 3 twice daily now with low sodium Reports having intolerance to torsemide but not positive as was on numerous new medications at that time If sodium continues to drop on Bumex, consider changing, retrial of torsemide or go back on Lasix with metolazone (seemed to do well previously on Lasix 80 twice daily,  with metolazone 2.5 for elevated weight/edema) Tolvaptan for hyponatremia Reports he is unable to afford follow-up with [facility] physicians as they are out of network and he does not have the money They are changing insurance for next year, wife reports they are hoping to find something in network  3) insomnia History of anxiety, insomnia,  frequent trips to the emergency room Previously tried numerous sedation medications, antipsychotics that did not seem to work for him Reports he currently feels fine.   4) chronic kidney disease stage IV Discussed with renal Component of underlying  renal failure with cardiorenal syndrome Frequent hospitalizations would likely be assisted by hemodialysis  5) ICD Follow[facility] at [facility]  6) anorexia Slow weight loss over the past several months  Eating less per the wife   Total encounter time more than 110 minutes  Greater than 50% was spent in counseling and coordination of care with the patient   For questions or updates, please contact [facility] Please consult [email or web address] for contact info under Cardiology/STEMI.   Signed, Julien Nordmann, MD  07/06/2017 2:57 PM

## 2017-07-06 NOTE — Progress Notes (Signed)
[employer] - [place] at [facility]   PATIENT NAME: Kelly PolioMichael Leonard    MR#:  [id number]  DATE OF BIRTH:  [birth year]  SUBJECTIVE:   Patient is very anxious. Denies shortness of breath or chest pain.  Muscle cramps have improved  REVIEW OF SYSTEMS:    Review of Systems  Constitutional: Negative for fever, chills weight loss HENT: Negative for ear pain, nosebleeds, congestion, facial swelling, rhinorrhea, neck pain, neck stiffness and ear discharge.   Respiratory: Negative for cough, shortness of breath, wheezing  Cardiovascular: Negative for chest pain, palpitations and positive lower leg swelling.  Gastrointestinal: Negative for heartburn, abdominal pain, vomiting, diarrhea or consitpation Genitourinary: Negative for dysuria, urgency, frequency, hematuria Musculoskeletal: Negative for back pain or joint pain Neurological: Negative for dizziness, seizures, syncope, focal weakness,  numbness and headaches.  Hematological: Does not bruise/bleed easily.  Psychiatric/Behavioral: Negative for hallucinations, confusion, dysphoric mood Positive anxiety    Tolerating Diet: yes      DRUG ALLERGIES:   Allergies  Allergen Reactions  . Entresto [Sacubitril-Valsartan] Swelling    Swelling of the face  . Penicillins Anaphylaxis and Swelling    Has patient had a PCN reaction causing immediate rash, facial/tongue/throat swelling, SOB or lightheadedness with hypotension: Yes Has patient had a PCN reaction causing severe rash involving mucus membranes or skin necrosis: No Has patient had a PCN reaction that required hospitalization: No Has patient had a PCN reaction occurring within the last 10 years: No If all of the above answers are "NO", then may proceed with Cephalosporin use.   . Ace Inhibitors Rash  . Carvedilol Other (See Comments)    "dizziness, light headed, and nausea"  . Hydroxyzine Nausea Only  . Seroquel [Quetiapine] Nausea And Vomiting    Hives   .  Sertraline Diarrhea    itching  . Trazodone And Nefazodone Other (See Comments)    "up for days"  . Lorazepam Rash    Rash and severe anxiety  . Torsemide Hives, Itching, Nausea And Vomiting and Rash    VITALS:  Blood pressure 131/82, pulse 92, temperature (!) 97.4 F (36.3 C), temperature source Oral, resp. rate 16, height 5\' 9"  (1.753 m), weight 62.9 kg (138 lb 9.6 oz), SpO2 100 %.  PHYSICAL EXAMINATION:  Constitutional: Appears well-developed and well-nourished. No distress. HENT: Normocephalic. Marland Kitchen. Oropharynx is clear and moist.  Eyes: Conjunctivae and EOM are normal. PERRLA, no scleral icterus.  Neck: Normal ROM. Neck supple. No JVD. No tracheal deviation. CVS: RRR, S1/S2 +, 2/6 SEM, PMI laterally displaced no gallops, no carotid bruit.  Pulmonary: Effort and breath sounds normal, no stridor, rhonchi, wheezes, rales.  Abdominal: Soft. BS +,  no distension, tenderness, rebound or guarding.  Musculoskeletal: Normal range of motion. 1+ lower extremity edema and no tenderness.  Neuro: Alert. CN 2-12 grossly intact. No focal deficits. Skin: Skin is warm and dry. No rash noted. Psychiatric: Normal mood and affect.      LABORATORY PANEL:   CBC  Recent Labs Lab 07/06/17 0433  WBC 8.3  HGB 11.9*  HCT 37.3*  PLT 418   ------------------------------------------------------------------------------------------------------------------  Chemistries   Recent Labs Lab 07/06/17 0433  NA 122*  K 5.5*  CL 81*  CO2 22  GLUCOSE 100*  BUN 95*  CREATININE 3.96*  CALCIUM 9.3  AST 146*  ALT 79*  ALKPHOS 60  BILITOT 1.6*   ------------------------------------------------------------------------------------------------------------------  Cardiac Enzymes  Recent Labs Lab 07/04/17 1932 07/05/17 1207  TROPONINI 0.05* 0.06*   ------------------------------------------------------------------------------------------------------------------  RADIOLOGY:  Dg Chest 2  View  Result Date: 07/05/2017 CLINICAL DATA:  Shortness of breath EXAM: CHEST  2 VIEW COMPARISON:  One day prior FINDINGS: Chronic cardiomegaly. Single chamber ICD/ pacer from the left. EKG leads create artifact over the chest. There is no edema, consolidation, effusion, or pneumothorax. IMPRESSION: 1. No evidence of active disease. 2. Cardiomegaly. Electronically Signed   By: Marnee Spring M.D.   On: 07/05/2017 12:56   Dg Chest 2 View  Result Date: 07/04/2017 CLINICAL DATA:  61 year old male with shortness of breath. EXAM: CHEST  2 VIEW COMPARISON:  Chest radiograph dated 06/12/2017 FINDINGS: There is stable mild cardiomegaly. No vascular congestion or edema. There is a single lead left pectoral AICD device. No focal consolidation, pleural effusion, or pneumothorax. No acute osseous pathology. IMPRESSION: 1. No acute cardiopulmonary process. 2. Stable cardiomegaly. Electronically Signed   By: Elgie Collard M.D.   On: 07/04/2017 18:41     ASSESSMENT AND PLAN:   61 year old male with chronic systolic heart failure ejection fraction of 20%, chronic kidney disease stage IV, COPD and essential hypertension who presents to the emergency room due to muscle cramps and found to have hyponatremia.  1. Hyponatremia due to diuretics ( Bumex) Continue monitoring sodium level Continue IV fluids, currently on normal saline 75 cc an hour Nephrology consultation requested May need TOLVAPTAN  2. Chronic systolic heart failure ejection fraction 20%: No signs of acute exacerbation Continue metoprolol Monitor daily weights Monitor intake and output  3. Acute on chronic kidney disease stage IV: Avoid nephrotoxic medications for now Nephrology consultation requested  4. Essential hypertension: Continue metoprolol  5. Anxiety: Discussed with patient and wife about alternative therapies such as meditation and yoga as well as the use of essential oils which may help with his anxiety.   6.  Hyperkalemia in the setting of acute kidney injury Dextrose and insulin given Repeat BMP    Management plans discussed with the patient and he is in agreement.  CODE STATUS: Full  TOTAL TIME TAKING CARE OF THIS PATIENT: 30 minutes.     POSSIBLE D/C 2 days, DEPENDING ON CLINICAL CONDITION.   Rondalyn Belford M.D on 07/06/2017 at 8:52 AM  Between 7am to 6pm - Pager - [phone] After 6pm go to [email or web address] - password [email or web address]  [facility]  Office  [phone]  CC: Primary care physician; Gabriel Cirri, NP  Note: This dictation was prepared with Dragon dictation along with smaller phrase technology. Any transcriptional errors that result from this process are unintentional.

## 2017-07-06 NOTE — Progress Notes (Signed)
Patient having high levels of anxiety , pacing around room, and bending over having shortness of breath over concerns about IV fluids, urination and Lower leg edema.  Educated about plan of care. Verbalized understanding. MD notified. Verbal order to stop fluids. Patient bladder scanned. Volume 5 ml. Will continue to monitor.

## 2017-07-06 NOTE — Progress Notes (Signed)
CRITICAL VALUE ALERT  Critical Value: Na+ 118  Date & Time Notied: 07/06/2017 15:47PM   Provider Notified: Renee Pain MD  Orders Received/Actions taken: continue to monitor

## 2017-07-06 NOTE — Progress Notes (Signed)
[facility] [place], [place] 07/06/17  Subjective:   Patient known to our practice from outpatient follow-up.  He is followed for chronic kidney disease Patient presented from home for increasing swelling and shortness of breath.  Upon arrival, he vomited several times.  He was found to have severe hyponatremia.  [facility] and hyponatremia His serum creatinine was 2.90 on October 21 he was discharged on Bumex 3 mg twice a day Usual sodium of 131 on October 10 On October 26, his sodium was 117  Today's level has improved to 123 Patient is able to take a normal diet now.  No nausea or vomiting this morning He continues to have significant amount of edema  Objective:  Vital signs in last 24 hours:  Temp:  [97.4 F (36.3 C)-97.6 F (36.4 C)] 97.4 F (36.3 C) (10/28 0805) Pulse Rate:  [84-92] 92 (10/28 0805) Resp:  [16-27] 16 (10/28 0805) BP: (123-150)/(82-102) 131/82 (10/28 0805) SpO2:  [89 %-100 %] 100 % (10/28 0805) Weight:  [62.9 kg (138 lb 9.6 oz)] 62.9 kg (138 lb 9.6 oz) (10/28 0408)  Weight change:  Filed Weights   07/05/17 1200 07/06/17 0408  Weight: 62.6 kg (138 lb) 62.9 kg (138 lb 9.6 oz)    Intake/Output:    Intake/Output Summary (Last 24 hours) at 07/06/17 1214 Last data filed at 07/06/17 1026  Gross per 24 hour  Intake              265 ml  Output              950 ml  Net             -685 ml     Physical Exam: General:  Thin gentleman, sitting up in the bed  HEENT  moist oral mucous membranes  Neck  supple, no masses  Pulm/lungs  clear to auscultation bilaterally  CVS/Heart  regular, S3 gallop  Abdomen:   Soft, nontender  Extremities:  2+ pitting edema, compression socks  Neurologic:  Alert, oriented  Skin:  No acute rashes          Basic Metabolic Panel:   Recent Labs Lab 07/04/17 1932 07/04/17 2226 07/05/17 1207 07/06/17 0433 07/06/17 0925  NA 117* 123* 118* 122* 123*  K  5.6* 4.3 5.0 5.5*  --   CL 77* 86* 78* 81*  --   CO2 24 21* 22 22  --   GLUCOSE 104* 93 123* 100*  --   BUN 87* 80* 92* 95*  --   CREATININE 3.65* 3.24* 3.79* 3.96*  --   CALCIUM 9.2 8.1* 8.9 9.3  --      CBC:  Recent Labs Lab 07/04/17 1932 07/05/17 1207 07/06/17 0433  WBC 7.6 8.8 8.3  NEUTROABS 5.7  --   --   HGB 12.8* 12.3* 11.9*  HCT 38.7* 37.8* 37.3*  MCV 77.6* 76.5* 77.7*  PLT 358 425 418     Lab Results  Component Value Date   HEPBSAG Negative 03/31/2017      Microbiology:  No results found for this or any previous visit (from the past 240 hour(s)).  Coagulation Studies: No results for input(s): LABPROT, INR in the last 72 hours.  Urinalysis: No results for input(s): COLORURINE, LABSPEC, PHURINE, GLUCOSEU, HGBUR, BILIRUBINUR, KETONESUR, PROTEINUR, UROBILINOGEN, NITRITE, LEUKOCYTESUR in the last 72 hours.  Invalid input(s): APPERANCEUR    Imaging: Dg Chest 2 View  Result Date: 07/05/2017 CLINICAL DATA:  Shortness of breath EXAM: CHEST  2 VIEW COMPARISON:  One day prior FINDINGS: Chronic cardiomegaly. Single chamber ICD/ pacer from the left. EKG leads create artifact over the chest. There is no edema, consolidation, effusion, or pneumothorax. IMPRESSION: 1. No evidence of active disease. 2. Cardiomegaly. Electronically Signed   By: Marnee Spring M.D.   On: 07/05/2017 12:56   Dg Chest 2 View  Result Date: 07/04/2017 CLINICAL DATA:  61 year old male with shortness of breath. EXAM: CHEST  2 VIEW COMPARISON:  Chest radiograph dated 06/12/2017 FINDINGS: There is stable mild cardiomegaly. No vascular congestion or edema. There is a single lead left pectoral AICD device. No focal consolidation, pleural effusion, or pneumothorax. No acute osseous pathology. IMPRESSION: 1. No acute cardiopulmonary process. 2. Stable cardiomegaly. Electronically Signed   By: Elgie Collard M.D.   On: 07/04/2017 18:41     Medications:   . sodium chloride Stopped (07/05/17  2152)   . aspirin EC  81 mg Oral Daily  . docusate sodium  100 mg Oral BID  . heparin  5,000 Units Subcutaneous Q8H  . Melatonin  10 mg Oral QHS  . metoprolol succinate  25 mg Oral Daily  . pantoprazole  40 mg Oral Daily   acetaminophen **OR** acetaminophen, bisacodyl, cyclobenzaprine, ipratropium-albuterol, ondansetron **OR** ondansetron (ZOFRAN) IV  Assessment/ Plan:  61 y.o.African American male with chronic systolic congestive heart failure EF < 20%, ICD, hypertension, hepatitis C, polycystic kidney disease and chronic kidney disease stage IV presents with nausea, vomiting and is found to have severe hyponatremia  1.  Severe hyponatremia, in the setting of severe chronic systolic CHF Hyponatremia worsened from over diuresis with bumetanide.Marland Kitchen He may have developed some symptoms of nausea and vomiting from hyponatremia.  This morning, sodium level has improved to 123 We will restart bumetanide 1 mg twice a day at a much lower dose compared to outpatient dose of 3 mg twice a day If sodium is still high tomorrow, consider tolvaptan 1 dose  2.  Lower extremity edema Compression socks in place Bumetanide as noted above  3.  Hyperkalemia Low potassium diet        LOS: 1 Kelly Leonard 10/28/201812:14 PM  [address] [place], [place] [phone]

## 2017-07-07 ENCOUNTER — Inpatient Hospital Stay (HOSPITAL_COMMUNITY): Payer: BLUE CROSS/BLUE SHIELD

## 2017-07-07 ENCOUNTER — Encounter (HOSPITAL_COMMUNITY)
Admission: AD | Disposition: A | Discharge status: Home / Self Care | Payer: Self-pay | Source: Other Acute Inpatient Hospital | Attending: Internal Medicine

## 2017-07-07 ENCOUNTER — Inpatient Hospital Stay (HOSPITAL_COMMUNITY)
Admission: AD | Admit: 2017-07-07 | Discharge: 2017-07-15 | DRG: 291 | Disposition: A | Discharge status: Home / Self Care | Payer: BLUE CROSS/BLUE SHIELD | Source: Other Acute Inpatient Hospital | Attending: Internal Medicine | Admitting: Internal Medicine

## 2017-07-07 DIAGNOSIS — R570 Cardiogenic shock: Secondary | ICD-10-CM | POA: Diagnosis present

## 2017-07-07 DIAGNOSIS — N390 Urinary tract infection, site not specified: Secondary | ICD-10-CM | POA: Diagnosis present

## 2017-07-07 DIAGNOSIS — G4700 Insomnia, unspecified: Secondary | ICD-10-CM | POA: Diagnosis present

## 2017-07-07 DIAGNOSIS — I130 Hypertensive heart and chronic kidney disease with heart failure and stage 1 through stage 4 chronic kidney disease, or unspecified chronic kidney disease: Secondary | ICD-10-CM | POA: Diagnosis present

## 2017-07-07 DIAGNOSIS — J449 Chronic obstructive pulmonary disease, unspecified: Secondary | ICD-10-CM | POA: Diagnosis present

## 2017-07-07 DIAGNOSIS — I420 Dilated cardiomyopathy: Secondary | ICD-10-CM | POA: Diagnosis present

## 2017-07-07 DIAGNOSIS — I340 Nonrheumatic mitral (valve) insufficiency: Secondary | ICD-10-CM | POA: Diagnosis not present

## 2017-07-07 DIAGNOSIS — F408 Other phobic anxiety disorders: Secondary | ICD-10-CM | POA: Diagnosis not present

## 2017-07-07 DIAGNOSIS — Q613 Polycystic kidney, unspecified: Secondary | ICD-10-CM | POA: Diagnosis not present

## 2017-07-07 DIAGNOSIS — E871 Hypo-osmolality and hyponatremia: Secondary | ICD-10-CM | POA: Diagnosis present

## 2017-07-07 DIAGNOSIS — Z888 Allergy status to other drugs, medicaments and biological substances status: Secondary | ICD-10-CM

## 2017-07-07 DIAGNOSIS — N179 Acute kidney failure, unspecified: Secondary | ICD-10-CM | POA: Diagnosis present

## 2017-07-07 DIAGNOSIS — F4323 Adjustment disorder with mixed anxiety and depressed mood: Secondary | ICD-10-CM | POA: Diagnosis not present

## 2017-07-07 DIAGNOSIS — N184 Chronic kidney disease, stage 4 (severe): Secondary | ICD-10-CM | POA: Diagnosis not present

## 2017-07-07 DIAGNOSIS — B1920 Unspecified viral hepatitis C without hepatic coma: Secondary | ICD-10-CM | POA: Diagnosis present

## 2017-07-07 DIAGNOSIS — I5082 Biventricular heart failure: Secondary | ICD-10-CM | POA: Diagnosis present

## 2017-07-07 DIAGNOSIS — I252 Old myocardial infarction: Secondary | ICD-10-CM | POA: Diagnosis present

## 2017-07-07 DIAGNOSIS — M109 Gout, unspecified: Secondary | ICD-10-CM | POA: Diagnosis present

## 2017-07-07 DIAGNOSIS — Z452 Encounter for adjustment and management of vascular access device: Secondary | ICD-10-CM

## 2017-07-07 DIAGNOSIS — I5023 Acute on chronic systolic (congestive) heart failure: Secondary | ICD-10-CM

## 2017-07-07 DIAGNOSIS — R252 Cramp and spasm: Secondary | ICD-10-CM | POA: Diagnosis not present

## 2017-07-07 DIAGNOSIS — Z880 Allergy status to penicillin: Secondary | ICD-10-CM

## 2017-07-07 DIAGNOSIS — R531 Weakness: Secondary | ICD-10-CM | POA: Diagnosis not present

## 2017-07-07 DIAGNOSIS — F419 Anxiety disorder, unspecified: Secondary | ICD-10-CM | POA: Diagnosis present

## 2017-07-07 DIAGNOSIS — R451 Restlessness and agitation: Secondary | ICD-10-CM | POA: Diagnosis not present

## 2017-07-07 DIAGNOSIS — N170 Acute kidney failure with tubular necrosis: Secondary | ICD-10-CM | POA: Diagnosis not present

## 2017-07-07 LAB — COMPREHENSIVE METABOLIC PANEL
ALBUMIN: 3 g/dL — AB (ref 3.5–5.0)
ALT: 108 U/L — ABNORMAL HIGH (ref 17–63)
ANION GAP: 17 — AB (ref 5–15)
AST: 188 U/L — AB (ref 15–41)
Alkaline Phosphatase: 67 U/L (ref 38–126)
BUN: 100 mg/dL — AB (ref 6–20)
CHLORIDE: 86 mmol/L — AB (ref 101–111)
CO2: 24 mmol/L (ref 22–32)
Calcium: 8.9 mg/dL (ref 8.9–10.3)
Creatinine, Ser: 3.91 mg/dL — ABNORMAL HIGH (ref 0.61–1.24)
GFR calc Af Amer: 18 mL/min — ABNORMAL LOW (ref >60)
GFR, EST NON AFRICAN AMERICAN: 15 mL/min — AB (ref >60)
GLUCOSE: 89 mg/dL (ref 65–99)
POTASSIUM: 4 mmol/L (ref 3.5–5.1)
Sodium: 127 mmol/L — ABNORMAL LOW (ref 135–145)
Total Bilirubin: 1.2 mg/dL (ref 0.3–1.2)
Total Protein: 6.5 g/dL (ref 6.5–8.1)

## 2017-07-07 LAB — ECHOCARDIOGRAM COMPLETE
AOASC: 36 cm
CHL CUP MV DEC (S): 201
CHL CUP REG VEL DIAS: 150 cm/s
CHL CUP TV REG PEAK VELOCITY: 296 cm/s
E decel time: 201 msec
FS: 14 % — AB (ref 28–44)
Height: 69 in
IV/PV OW: 0.77
LA ID, A-P, ES: 52 mm
LA diam end sys: 52 mm
LA vol A4C: 111 ml
LA vol index: 68.6 mL/m2
LA vol: 120 mL
LADIAMINDEX: 2.97 cm/m2
LDCA: 3.14 cm2
LVOT diameter: 20 mm
MV Peak grad: 7 mmHg
MV VTI: 177 cm
MV pk A vel: 70.3 m/s
MV pk E vel: 128 m/s
PV Reg grad dias: 9 mmHg
PW: 10.8 mm — AB (ref 0.6–1.1)
RV sys press: 50 mmHg
TAPSE: 17.4 mm
TRMAXVEL: 296 cm/s
Weight: 2229.29 oz

## 2017-07-07 LAB — CBC WITH DIFFERENTIAL/PLATELET
BASOS ABS: 0 10*3/uL (ref 0.0–0.1)
Basophils Relative: 0 %
EOS PCT: 1 %
Eosinophils Absolute: 0.1 10*3/uL (ref 0.0–0.7)
HCT: 34.5 % — ABNORMAL LOW (ref 39.0–52.0)
Hemoglobin: 11.3 g/dL — ABNORMAL LOW (ref 13.0–17.0)
Lymphocytes Relative: 21 %
Lymphs Abs: 1.3 10*3/uL (ref 0.7–4.0)
MCH: 24.4 pg — ABNORMAL LOW (ref 26.0–34.0)
MCHC: 32.8 g/dL (ref 30.0–36.0)
MCV: 74.4 fL — ABNORMAL LOW (ref 78.0–100.0)
MONO ABS: 0.5 10*3/uL (ref 0.1–1.0)
Monocytes Relative: 9 %
Neutro Abs: 4.4 10*3/uL (ref 1.7–7.7)
Neutrophils Relative %: 69 %
PLATELETS: 416 10*3/uL — AB (ref 150–400)
RBC: 4.64 MIL/uL (ref 4.22–5.81)
RDW: 16.1 % — AB (ref 11.5–15.5)
WBC: 6.4 10*3/uL (ref 4.0–10.5)

## 2017-07-07 LAB — BASIC METABOLIC PANEL
ANION GAP: 14 (ref 5–15)
BUN: 100 mg/dL — ABNORMAL HIGH (ref 6–20)
CHLORIDE: 85 mmol/L — AB (ref 101–111)
CO2: 24 mmol/L (ref 22–32)
Calcium: 8.9 mg/dL (ref 8.9–10.3)
Creatinine, Ser: 4.14 mg/dL — ABNORMAL HIGH (ref 0.61–1.24)
GFR calc Af Amer: 17 mL/min — ABNORMAL LOW (ref >60)
GFR calc non Af Amer: 14 mL/min — ABNORMAL LOW (ref >60)
GLUCOSE: 101 mg/dL — AB (ref 65–99)
POTASSIUM: 5.3 mmol/L — AB (ref 3.5–5.1)
Sodium: 123 mmol/L — ABNORMAL LOW (ref 135–145)

## 2017-07-07 LAB — SODIUM: Sodium: 123 mmol/L — ABNORMAL LOW (ref 135–145)

## 2017-07-07 LAB — COOXEMETRY PANEL
Carboxyhemoglobin: 1.3 % (ref 0.5–1.5)
METHEMOGLOBIN: 1 % (ref 0.0–1.5)
O2 Saturation: 41.1 %
TOTAL HEMOGLOBIN: 10.6 g/dL — AB (ref 12.0–16.0)

## 2017-07-07 LAB — GLUCOSE, CAPILLARY: GLUCOSE-CAPILLARY: 111 mg/dL — AB (ref 65–99)

## 2017-07-07 LAB — MRSA PCR SCREENING: MRSA BY PCR: NEGATIVE

## 2017-07-07 LAB — MAGNESIUM: MAGNESIUM: 2.3 mg/dL (ref 1.7–2.4)

## 2017-07-07 LAB — PROTIME-INR
INR: 1.2
PROTHROMBIN TIME: 15.1 s (ref 11.4–15.2)

## 2017-07-07 SURGERY — RIGHT HEART CATH
Anesthesia: LOCAL

## 2017-07-07 MED ORDER — DOBUTAMINE IN D5W 4-5 MG/ML-% IV SOLN
2.5000 ug/kg/min | INTRAVENOUS | Status: DC
  Administered 2017-07-07: 2.5 ug/kg/min via INTRAVENOUS
  Filled 2017-07-07 (×2): qty 250

## 2017-07-07 MED ORDER — SALINE SPRAY 0.65 % NA SOLN
1.0000 | NASAL | Status: DC | PRN
  Filled 2017-07-07: qty 44

## 2017-07-07 MED ORDER — SODIUM CHLORIDE 0.9% FLUSH
3.0000 mL | Freq: Two times a day (BID) | INTRAVENOUS | Status: DC
  Administered 2017-07-07 – 2017-07-15 (×11): 3 mL via INTRAVENOUS

## 2017-07-07 MED ORDER — SODIUM CHLORIDE 0.9 % IV SOLN: 250.0000 mL | INTRAVENOUS | Status: DC | PRN

## 2017-07-07 MED ORDER — FENTANYL CITRATE (PF) 100 MCG/2ML IJ SOLN
INTRAMUSCULAR | Status: AC
  Administered 2017-07-07: 50 ug
  Filled 2017-07-07: qty 4

## 2017-07-07 MED ORDER — ONDANSETRON HCL 4 MG/2ML IJ SOLN
4.0000 mg | Freq: Four times a day (QID) | INTRAMUSCULAR | Status: DC | PRN
  Administered 2017-07-10 – 2017-07-12 (×3): 4 mg via INTRAVENOUS
  Filled 2017-07-07 (×3): qty 2

## 2017-07-07 MED ORDER — ACETAMINOPHEN 325 MG PO TABS: 650.0000 mg | ORAL_TABLET | ORAL | Status: DC | PRN

## 2017-07-07 MED ORDER — MIDAZOLAM HCL 2 MG/2ML IJ SOLN
INTRAMUSCULAR | Status: AC
  Administered 2017-07-07: 3 mg
  Filled 2017-07-07: qty 4

## 2017-07-07 MED ORDER — SODIUM CHLORIDE 0.9% FLUSH: 3.0000 mL | INTRAVENOUS | Status: DC | PRN

## 2017-07-07 MED ORDER — SODIUM POLYSTYRENE SULFONATE 15 GM/60ML PO SUSP
30.0000 g | Freq: Once | ORAL | Status: AC
  Administered 2017-07-07: 30 g via ORAL
  Filled 2017-07-07: qty 120

## 2017-07-07 MED ORDER — CALCIUM CARBONATE ANTACID 500 MG PO CHEW: 1.0000 | CHEWABLE_TABLET | ORAL | Status: DC | PRN

## 2017-07-07 MED ORDER — ENOXAPARIN SODIUM 30 MG/0.3ML ~~LOC~~ SOLN
30.0000 mg | SUBCUTANEOUS | Status: DC
  Administered 2017-07-07 – 2017-07-08 (×2): 30 mg via SUBCUTANEOUS
  Filled 2017-07-07 (×2): qty 0.3

## 2017-07-07 MED ORDER — ALPRAZOLAM 0.5 MG PO TABS
0.5000 mg | ORAL_TABLET | Freq: Three times a day (TID) | ORAL | Status: DC | PRN
  Administered 2017-07-07: 0.5 mg via ORAL
  Filled 2017-07-07: qty 1

## 2017-07-07 NOTE — Progress Notes (Signed)
Verbal orders for sedation during Kelly Leonard insertion by Dr. Gala Romney were as follows:  Total of 3mg  of Versed Total of 50 mcg of Fentanyl  1mg  of Versed and 50 mcg of Fentanyl wasted down sink with Kelly Jewels, RN

## 2017-07-07 NOTE — Plan of Care (Signed)
Problem: Bowel/Gastric: Goal: Will not experience complications related to bowel motility Outcome: Progressing Pt had two bowel movements today.

## 2017-07-07 NOTE — Progress Notes (Signed)
Progress Note  Patient Name: Kelly Leonard Date of Encounter: 07/07/2017  Primary Cardiologist: [facility]  Subjective   Feels like breathing is somewhat improved, though still feels SOB and continues to note LE swelling. Documented UOP of 1.6 L for the admission. Weight 139 pounds this morning (reported admission weight of 138 pounds). Feels like he has not been voiding as frequently on PO Bumex 1 mg bid when compared to Lasix. On Bumex 3 mg bid at home prior to admission. Restarted on Bumex 1 mg bid 10/28 at the recommendation of nephrology. Given PO Lasix overnight. No chest pain.   Inpatient Medications    Scheduled Meds: . aspirin EC  81 mg Oral Daily  . bumetanide  1 mg Oral BID  . docusate sodium  100 mg Oral BID  . heparin  5,000 Units Subcutaneous Q8H  . Melatonin  10 mg Oral QHS  . metoprolol succinate  25 mg Oral Daily  . pantoprazole  40 mg Oral Daily  . sodium polystyrene  30 g Oral Once   Continuous Infusions:  PRN Meds: acetaminophen **OR** acetaminophen, bisacodyl, calcium carbonate, cyclobenzaprine, ipratropium-albuterol, ondansetron **OR** ondansetron (ZOFRAN) IV, sodium chloride   Vital Signs    Vitals:   07/06/17 0805 07/06/17 1626 07/06/17 1910 07/07/17 0315  BP: 131/82 (!) 118/92 (!) 126/91 123/89  Pulse: 92 85 93 92  Resp: 16 14 17 17   Temp: (!) 97.4 F (36.3 C)  (!) 97.3 F (36.3 C) (!) 97.5 F (36.4 C)  TempSrc: Oral  Oral Oral  SpO2: 100% 100% 100% 100%  Weight:    139 lb 3.2 oz (63.1 kg)  Height:        Intake/Output Summary (Last 24 hours) at 07/07/17 0754 Last data filed at 07/07/17 0500  Gross per 24 hour  Intake              360 ml  Output             1975 ml  Net            -1615 ml   Filed Weights   07/05/17 1200 07/06/17 0408 07/07/17 0315  Weight: 138 lb (62.6 kg) 138 lb 9.6 oz (62.9 kg) 139 lb 3.2 oz (63.1 kg)    Telemetry    NSR, 90s bpm - Personally Reviewed  ECG    n/a - Personally Reviewed  Physical Exam    GEN: No acute distress.   Neck: JVD elevated to the angle of the mandible. Cardiac: RRR, no murmurs, rubs, or gallops.  Respiratory: Clear to auscultation bilaterally.  GI: Soft, nontender, mildly distended.   MS: 2+ pitting bilateral LE edema to the knees; No deformity. Neuro:  Alert and oriented x 3; Nonfocal.  Psych: Normal affect.  Labs    Chemistry Recent Labs Lab 07/05/17 1207 07/06/17 0433  07/06/17 1503 07/06/17 2046 07/07/17 0322  NA 118* 122*  < > 118* 124* 123*  K 5.0 5.5*  --   --   --  5.3*  CL 78* 81*  --   --   --  85*  CO2 22 22  --   --   --  24  GLUCOSE 123* 100*  --   --   --  101*  BUN 92* 95*  --   --   --  100*  CREATININE 3.79* 3.96*  --   --   --  4.14*  CALCIUM 8.9 9.3  --   --   --  8.9  PROT  --  7.3  --   --   --   --   ALBUMIN  --  3.4*  --   --   --   --   AST  --  146*  --   --   --   --   ALT  --  79*  --   --   --   --   ALKPHOS  --  60  --   --   --   --   BILITOT  --  1.6*  --   --   --   --   GFRNONAA 16* 15*  --   --   --  14*  GFRAA 18* 18*  --   --   --  17*  ANIONGAP 18* 19*  --   --   --  14  < > = values in this interval not displayed.   Hematology Recent Labs Lab 07/04/17 1932 07/05/17 1207 07/06/17 0433  WBC 7.6 8.8 8.3  RBC 4.99 4.95 4.80  HGB 12.8* 12.3* 11.9*  HCT 38.7* 37.8* 37.3*  MCV 77.6* 76.5* 77.7*  MCH 25.7* 25.0* 24.8*  MCHC 33.1 32.6 31.9*  RDW 16.8* 17.1* 16.8*  PLT 358 425 418    Cardiac Enzymes Recent Labs Lab 07/04/17 1932 07/05/17 1207  TROPONINI 0.05* 0.06*   No results for input(s): TROPIPOC in the last 168 hours.   BNP Recent Labs Lab 07/04/17 1932  BNP 3,665.0*     DDimer No results for input(s): DDIMER in the last 168 hours.   Radiology    Dg Chest 2 View  Result Date: 07/05/2017 CLINICAL DATA:  Shortness of breath EXAM: CHEST  2 VIEW COMPARISON:  One day prior FINDINGS: Chronic cardiomegaly. Single chamber ICD/ pacer from the left. EKG leads create artifact over the  chest. There is no edema, consolidation, effusion, or pneumothorax. IMPRESSION: 1. No evidence of active disease. 2. Cardiomegaly. Electronically Signed   By: Marnee SpringJonathon  Watts M.D.   On: 07/05/2017 12:56    Cardiac Studies   TTE 05/2017: Study Conclusions  - Left ventricle: The cavity size was mildly dilated. Systolic   function was severely reduced. The estimated ejection fraction   was <20% Diffuse hypokinesis. Regional wall motion abnormalities   cannot be excluded. Findings consistent with left ventricular   diastolic dysfunction. - Mitral valve: There was mild to moderate regurgitation. - Left atrium: The atrium was mildly dilated. - Right ventricle: The cavity size was mildly dilated. Wall   thickness was normal. - Tricuspid valve: There was moderate regurgitation. - Pulmonary arteries: Systolic pressure was moderately elevated. PA   peak pressure: 55 mm Hg (S). - Pericardium, extracardiac: A trivial pericardial effusion was   identified.  Impressions:  - No significant change compared to study dated 2007.  Patient Profile     61 y.o. male with history of nonischemic cardiomyopathy, frequent hospital admissions for acute on chronic systolic CHF , who is being seen today for the evaluation of acute on chronic systolic CHF.  Assessment & Plan    1. Acute on chronic systolic CHF/sp ICD: -Remains volume up -Hold Bumex this morning given worsening renal function, await renal input -Continue Toprol  -Not on ACEi/ARB/ARNI/spiro given CKD -[facility]  2. Hyponatremia: -Sodium remains low at 123 -Recommend tolvaptan this morning, discuss with MD and renal  3. CKD stage IV: -Renal function worsening from 3.96-->4.14 this morning  4. Hyperkalemia: -Bumex as above -  Kayexalate ordered for this AM -Recheck this PM   For questions or updates, please contact [facility] Please consult [email or web address] for contact info under Cardiology/STEMI.    Signed, Eula Listen, PA-C [facility] Pager: [phone] 07/07/2017, 7:54 AM

## 2017-07-07 NOTE — Progress Notes (Signed)
Shower not foley care

## 2017-07-07 NOTE — H&P (Signed)
Advanced Heart Failure Team Consult Note   Primary Physician: Gabriel Cirri NP  Primary Cardiologist:  Horald Chestnut Nephrology: Dr Glenna Fellows   Reason for Consultation: Heart Failure   HPI:    Kelly Leonard is seen today for evaluation of heart failure at the request of  Dr Kirke Corin..   Kelly Leonard is a 61 year old with a history of severe systolic HF EF 20% due to NICM (normal coronaries on cath 03/09/13) s/p ICD, CKD Stage IV (baseline creatinine 2.8-3.0), HTN and anxiety.   Reports a long h/o CHF but can't tell me who his heart doctor is. In CareEverywhere it seems as if he has been seen by [facility] and [facility]. Has also followed with [facility] Nephrology with Dr. Glenna Fellows.   Recently admitted October 16th at [facility] with A/C systolic heart failure. Discharged on bumex 3 mg twice a day. D/C weight 124 pounds. Says he cannot take a lot of HF meds due to allergies.   In October he was evaluated ED x4 with insomnia, hyponatremia and  confusion.   Admitted to [facility] October 27th with weakness and cramps and Class IV HF symptoms. Sodium on admit was 123. Treated with NS IV fluids. Diuretics held. Creatinine has been trending up 2.8>4.14. Unable to diurese. Transferred to [facility] today for advanced heart failure care.   Complaining of fatigue. SOB with any exertion. Denies CP. Was working as a Kelly Leonard at [employer] but stopped working 2 months ago. Denies ETOH, drugs or cigarette use.    Echo 05/2017 EF 20-25%   Review of Systems: [y] = yes, [ ]  = no   General: Weight gain [ ] ; Weight loss [ ] ; Anorexia [ ] ; Fatigue [Y ]; Fever [ ] ; Chills [ ] ; Weakness [Y ]  Cardiac: Chest pain/pressure [ ] ; Resting SOB [ ] ; Exertional SOB [ Y]; Orthopnea [ ] ; Pedal Edema Cove.Etienne ]; Palpitations [ ] ; Syncope [ ] ; Presyncope [ ] ; Paroxysmal nocturnal dyspnea[ ]   Pulmonary: Cough [ ] ; Wheezing[ ] ; Hemoptysis[ ] ; Sputum [ ] ; Snoring [ ]   GI: Vomiting[ ] ; Dysphagia[ ] ; Melena[ ] ; Hematochezia [ ] ; Heartburn[ ] ;  Abdominal pain [ ] ; Constipation [ ] ; Diarrhea [ ] ; BRBPR [ ]   GU: Hematuria[ ] ; Dysuria [ ] ; Nocturia[ ]   Vascular: Pain in legs with walking [ ] ; Pain in feet with lying flat [ ] ; Non-healing sores [ ] ; Stroke [ ] ; TIA [ ] ; Slurred speech [ ] ;  Neuro: Headaches[ ] ; Vertigo[ ] ; Seizures[ ] ; Paresthesias[ ] ;Blurred vision [ ] ; Diplopia [ ] ; Vision changes [ ]   Ortho/Skin: Arthritis Cove.Etienne ]; Joint pain [Y ]; Muscle pain [ ] ; Joint swelling [ ] ; Back Pain [ ] ; Rash [ ]   Psych: Depression[ ] ; Anxiety[y ]  Heme: Bleeding problems [ ] ; Clotting disorders [ ] ; Anemia [ ]   Endocrine: Diabetes [ ] ; Thyroid dysfunction[ ]   Home Medications Prior to Admission medications   Medication Sig Start Date End Date Taking? Authorizing Provider  aspirin EC 81 MG tablet Take 81 mg by mouth daily.    [provider]  b complex vitamins tablet Take 1 tablet by mouth daily.    [provider]  Melatonin 10 MG CAPS Take 10 mg by mouth at bedtime.    [provider]  metoprolol succinate (TOPROL-XL) 25 MG 24 hr tablet Take 25 mg by mouth daily.    [provider]  Multiple Vitamins-Minerals (MULTIVITAMIN WITH MINERALS) tablet Take 1 tablet by mouth daily.  [provider]    Past Medical History: Past Medical History:  Diagnosis Date  . Anxiety   . CHF (congestive heart failure) (HCC)   . Chronic systolic congestive heart failure (HCC)   . CKD (chronic kidney disease) stage 4, GFR 15-29 ml/min (HCC)   . COPD (chronic obstructive pulmonary disease) (HCC)   . Dilated cardiomyopathy (HCC)   . Hyperlipidemia   . Hypertension   . MI (myocardial infarction) (HCC)   . Polycystic kidney   . Renal disorder     Past Surgical History: Past Surgical History:  Procedure Laterality Date  . CARDIAC CATHETERIZATION    . IMPLANTABLE CARDIOVERTER DEFIBRILLATOR (ICD) GENERATOR CHANGE Right 06/23/2015   Procedure: ICD GENERATOR  INITIAL IMPLANT;  Surgeon: Sharion Settler, MD;   Kelly: [facility];  Service: Cardiovascular;  Laterality: Right;    Family History: Family History  Problem Relation Age of Onset  . Diabetes Mother   . Hypertension Brother   . Heart disease Maternal Grandmother   . Heart attack Maternal Grandmother     Social History: Social History   Social History  . Marital status: Single    Spouse name: N/A  . Number of children: N/A  . Years of education: N/A   Social History Main Topics  . Smoking status: Never Smoker  . Smokeless tobacco: Never Used  . Alcohol use No  . Drug use: No  . Sexual activity: Yes   Other Topics Concern  . Not on file   Social History Narrative  . No narrative on file    Allergies:  Allergies  Allergen Reactions  . Entresto [Sacubitril-Valsartan] Swelling    Swelling of the face  . Penicillins Anaphylaxis and Swelling    Has patient had a PCN reaction causing immediate rash, facial/tongue/throat swelling, SOB or lightheadedness with hypotension: Yes Has patient had a PCN reaction causing severe rash involving mucus membranes or skin necrosis: No Has patient had a PCN reaction that required hospitalization: No Has patient had a PCN reaction occurring within the last 10 years: No If all of the above answers are "NO", then may proceed with Cephalosporin use.   . Ace Inhibitors Rash  . Carvedilol Other (See Comments)    "dizziness, light headed, and nausea"  . Hydroxyzine Nausea Only  . Seroquel [Quetiapine] Nausea And Vomiting    Hives   . Sertraline Diarrhea    itching  . Trazodone And Nefazodone Other (See Comments)    "up for days"  . Lorazepam Rash    Rash and severe anxiety  . Torsemide Hives, Itching, Nausea And Vomiting and Rash    Objective:    Vital Signs:   Pulse Rate:  [56] 56 (10/29 1300) Resp:  [18] 18 (10/29 1300) BP: (127)/(100) 127/100 (10/29 1300) SpO2:  [100 %] 100 % (10/29 1300) Weight:  [139 lb 5.3 oz (63.2 kg)] 139 lb 5.3 oz (63.2 kg) (10/29 1300)    Weight  change: Filed Weights   07/07/17 1300  Weight: 139 lb 5.3 oz (63.2 kg)    Intake/Output:  No intake or output data in the 24 hours ending 07/07/17 1318    Physical Exam    General:  Thin. Appears chronically ill.  No resp difficulty HEENT: normal Neck: supple. JVP to jaw  . Carotids 2+ bilat; no bruits. No lymphadenopathy or thyromegaly appreciated. Cor: PMI laterally displaced. Regular rate & rhythm. No rubs. 3/6 Kelly . + S3  Lungs: clear on room air.  Abdomen: soft, nontender, +  distended. + liver edge down No bruits or masses. Good bowel sounds. Extremities: no cyanosis, clubbing, rash, R and LLE 2-3+ edema.  Neuro: alert & orientedx3, cranial nerves grossly intact. moves all 4 extremities w/o difficulty. Affect pleasant   Telemetry   NSR 90s personally reviewed.   EKG    N/A  Labs   Basic Metabolic Panel:  Recent Labs Lab 07/04/17 1932 07/04/17 2226 07/05/17 1207 07/06/17 1610 07/06/17 0925 07/06/17 1503 07/06/17 2046 07/07/17 0322 07/07/17 0832  NA 117* 123* 118* 122* 123* 118* 124* 123* 123*  K 5.6* 4.3 5.0 5.5*  --   --   --  5.3*  --   CL 77* 86* 78* 81*  --   --   --  85*  --   CO2 24 21* 22 22  --   --   --  24  --   GLUCOSE 104* 93 123* 100*  --   --   --  101*  --   BUN 87* 80* 92* 95*  --   --   --  100*  --   CREATININE 3.65* 3.24* 3.79* 3.96*  --   --   --  4.14*  --   CALCIUM 9.2 8.1* 8.9 9.3  --   --   --  8.9  --     Liver Function Tests:  Recent Labs Lab 07/06/17 0433  AST 146*  ALT 79*  ALKPHOS 60  BILITOT 1.6*  PROT 7.3  ALBUMIN 3.4*   No results for input(s): LIPASE, AMYLASE in the last 168 hours. No results for input(s): AMMONIA in the last 168 hours.  CBC:  Recent Labs Lab 07/04/17 1932 07/05/17 1207 07/06/17 0433  WBC 7.6 8.8 8.3  NEUTROABS 5.7  --   --   HGB 12.8* 12.3* 11.9*  HCT 38.7* 37.8* 37.3*  MCV 77.6* 76.5* 77.7*  PLT 358 425 418    Cardiac Enzymes:  Recent Labs Lab 07/04/17 1932 07/05/17 1207    TROPONINI 0.05* 0.06*    BNP: BNP (last 3 results)  Recent Labs  05/29/17 2152 06/05/17 1537 07/04/17 1932  BNP 3,608.0* 4,465.0* 3,665.0*    ProBNP (last 3 results) No results for input(s): PROBNP in the last 8760 hours.   CBG:  Recent Labs Lab 07/06/17 0749 07/07/17 0736  GLUCAP 99 111*    Coagulation Studies: No results for input(s): LABPROT, INR in the last 72 hours.   Imaging    No results found.   Medications:     Current Medications:    Infusions:      Patient Profile    Kelly Leonard is a 61 year old with a history of NICM, CKD Stage III, HTN, anxiety, ICD, and chronic systolic heart failure.  Admitted with A/C systolic heart failure,    Assessment/Plan   1. A/C Systolic Heart Failure  NYHA IIIb. NICM. ECHO EF 05/2017 EF 20-25%.  HF medication regimen will be difficult given several allergies.  Try to get on hydralazine/nitrate. No Spiro/dig with CKD.  Allergy to torsemide, carvedilol, entresto, and ace inhibitors. 2. Hyponatremia 3. AKI on CKD Stage IV- Creatinine baseline 2.8-3.0. Consult Nephrology. 4.  Anxiety     Admitted today for advanced heart failure management. RHC with swan now to guide therapy. Check labs. Repeat ECHo today.   Length of Stay: 0  Tonye Becket, NP  07/07/2017, 1:18 PM  Advanced Heart Failure Team Pager [phone] (M-F; 7a - 4p)  Please contact [facility] for night-coverage after  hours (4p -7a ) and weekends on [email or web address]  Patient seen and examined with Tonye BecketAmy Clegg, NP. We discussed all aspects of the encounter. I agree with the assessment and plan as stated above.   61 y/o male with advanced systolic HF due to NICM and also advanced CKD. Now presents with end-stage HF with worsening renal function and severe hyponatremia. Situation complicated by multiple drug allergies/intolerances and severe anxiety. Will take to cath lab for RHC/swan placement today and possible inotrope initiation. Will need Renal  input. Only durable long-term option will be heart-kidney transplant but unclear at this point if he will be a candidate.   Arvilla MeresBensimhon, Jayshaun Phillips, MD  2:04 PM

## 2017-07-07 NOTE — Progress Notes (Signed)
[facility], [place] 07/07/17  Subjective:  Renal function unfortunately deteriorating. Creatinine up to 3.96. Hyponatremia also persists with a serum sodium of 123. Urine output was 1.9 L over the preceding 24 hours. Case discussed in depth with patient as well as cardiology. He is being actively transferred to [facility] to be seen by advanced heart failure providers.   Objective:  Vital signs in last 24 hours:  Temp:  [97.3 F (36.3 C)-97.5 F (36.4 C)] 97.5 F (36.4 C) (10/29 0315) Pulse Rate:  [85-95] 95 (10/29 1112) Resp:  [14-18] 18 (10/29 1112) BP: (118-141)/(89-99) 141/99 (10/29 1112) SpO2:  [94 %-100 %] 94 % (10/29 1112) Weight:  [63.1 kg (139 lb 3.2 oz)] 63.1 kg (139 lb 3.2 oz) (10/29 0315)  Weight change: 0.544 kg (1 lb 3.2 oz) Filed Weights   07/05/17 1200 07/06/17 0408 07/07/17 0315  Weight: 62.6 kg (138 lb) 62.9 kg (138 lb 9.6 oz) 63.1 kg (139 lb 3.2 oz)    Intake/Output:    Intake/Output Summary (Last 24 hours) at 07/07/17 1200 Last data filed at 07/07/17 0859  Gross per 24 hour  Intake              120 ml  Output             1025 ml  Net             -905 ml     Physical Exam: General: Thin gentleman, sitting up in the bed  HEENT moist oral mucous membranes  Neck supple  Pulm/lungs clear to auscultation bilaterally  CVS/Heart regular, no obvious rub  Abdomen:  Soft, nontender  Extremities: 2+ pitting edema in all extremeties  Neurologic: Alert, oriented  Skin: No acute rashes          Basic Metabolic Panel:   Recent Labs Lab 07/04/17 1932 07/04/17 2226 07/05/17 1207 07/06/17 0433 07/06/17 0925 07/06/17 1503 07/06/17 2046 07/07/17 0322 07/07/17 0832  NA 117* 123* 118* 122* 123* 118* 124* 123* 123*  K 5.6* 4.3 5.0 5.5*  --   --   --  5.3*  --   CL 77* 86* 78* 81*  --   --   --  85*  --   CO2 24 21* 22 22  --   --   --  24  --   GLUCOSE 104* 93 123* 100*  --   --   --  101*  --   BUN 87* 80* 92* 95*   --   --   --  100*  --   CREATININE 3.65* 3.24* 3.79* 3.96*  --   --   --  4.14*  --   CALCIUM 9.2 8.1* 8.9 9.3  --   --   --  8.9  --      CBC:  Recent Labs Lab 07/04/17 1932 07/05/17 1207 07/06/17 0433  WBC 7.6 8.8 8.3  NEUTROABS 5.7  --   --   HGB 12.8* 12.3* 11.9*  HCT 38.7* 37.8* 37.3*  MCV 77.6* 76.5* 77.7*  PLT 358 425 418      Lab Results  Component Value Date   HEPBSAG Negative 03/31/2017      Microbiology:  No results found for this or any previous visit (from the past 240 hour(s)).  Coagulation Studies: No results for input(s): LABPROT, INR in the last 72 hours.  Urinalysis: No results for input(s): COLORURINE, LABSPEC, PHURINE, GLUCOSEU, HGBUR, BILIRUBINUR, KETONESUR, PROTEINUR, UROBILINOGEN, NITRITE, LEUKOCYTESUR in the  last 72 hours.  Invalid input(s): APPERANCEUR    Imaging: Dg Chest 2 View  Result Date: 07/05/2017 CLINICAL DATA:  Shortness of breath EXAM: CHEST  2 VIEW COMPARISON:  One day prior FINDINGS: Chronic cardiomegaly. Single chamber ICD/ pacer from the left. EKG leads create artifact over the chest. There is no edema, consolidation, effusion, or pneumothorax. IMPRESSION: 1. No evidence of active disease. 2. Cardiomegaly. Electronically Signed   By: Marnee SpringJonathon  Watts M.D.   On: 07/05/2017 12:56     Medications:    . aspirin EC  81 mg Oral Daily  . docusate sodium  100 mg Oral BID  . heparin  5,000 Units Subcutaneous Q8H  . Melatonin  10 mg Oral QHS  . metoprolol succinate  25 mg Oral Daily  . pantoprazole  40 mg Oral Daily   acetaminophen **OR** acetaminophen, bisacodyl, calcium carbonate, cyclobenzaprine, ipratropium-albuterol, ondansetron **OR** ondansetron (ZOFRAN) IV, sodium chloride  Assessment/ Plan:  61 y.o.African American male with chronic systolic congestive heart failure EF < 20%, ICD, hypertension, hepatitis C, polycystic kidney disease and chronic kidney disease stage IV presents with nausea, vomiting and is found to  have severe hyponatremia  1.  Severe hyponatremia, in the setting of severe chronic systolic CHF Hyponatremia worsened from over diuresis with bumetanide.Marland Kitchen. He may have developed some symptoms of nausea and vomiting from hyponatremia.   -  Serum sodium remains quite low at 123. Suspect that severe cardiorenal syndrome also playing a role here. He may ultimately end up requiring dialysis to correct his hyponatremia. [facility] therefore we defer further management to the nephrologists there.  2. Acute renal failure/chronic kidney disease stage IV. Baseline creatinine 2.8.  Creatinine now up to 4.1 with a BUN of 100. Hemodialysis may need to be considered if inotropes don't improve his renal function. Recommend nephrology consultation at [facility].  3. Hyperkalemia. Serum potassium currently 5.3.  Consider adding veltassa to his medication regimen.  4. Severe acute on chronic systolic heart failure. Patient being [facility] for consideration of LVAD as well as inotropic therapy.        LOS: 2 Kyros Salzwedel 10/29/201812:00 PM  [facility] [place], [place] [phone]

## 2017-07-07 NOTE — Care Management (Signed)
For transfer to [facility] for advanced heart failure treatment

## 2017-07-07 NOTE — Progress Notes (Signed)
  Echocardiogram 2D Echocardiogram has been performed.  Dorena Dew Keiran Gaffey 07/07/2017, 4:41 PM

## 2017-07-07 NOTE — Progress Notes (Signed)
Post Swan Insertion X-Ray resulted critical with the recommendation that the swan be withdrawn 5cm. Dr. Gala Romney made aware and RN was told that by MD that the swan was in the appropriate place.

## 2017-07-07 NOTE — Procedures (Addendum)
Pulmonary Artery Catheter Insertion Procedure Note Eder Strome [id number] 06-Nov-1955  Procedure: Insertion of Pulmonary Artery Catheter Indications: Assessment of intravascular volume  Procedure Details Consent: Risks of procedure as well as the alternatives and risks of each were explained to the (patient/caregiver).  Consent for procedure obtained. Time Out: Verified patient identification, verified procedure, site/side was marked, verified correct patient position, special equipment/implants available, medications/allergies/relevent history reviewed, required imaging and test results available.  Performed  Maximum sterile technique was used including antiseptics, cap, gloves, gown, hand hygiene, mask and sheet. Skin prep: Chlorhexidine; local anesthetic administered A antimicrobial bonded/coated single lumen sheath was placed in the right internal jugular vein using the Seldinger technique. Once the sheath was in place a Swan Ganz catheter was positioned into the pulmonary artery using pressure waveform guidance. A full right hear cath was performed and pressures were sampled in each chamber and cardiac output measured (see note in computer for full readings). The catheter was left in for ongoing monitoring due to cardiogenic shock.   Evaluation Blood flow good Complications: No apparent complications Patient did tolerate procedure well. Chest X-ray ordered to verify placement.  CXR: pending.  Arvilla Meres MD 07/07/2017, 5:42 PM

## 2017-07-07 NOTE — Discharge Summary (Signed)
[facility] - [place] [facility]   PATIENT NAME: Kelly PolioMichael Leonard    MR#:  [id number]  DATE OF BIRTH:  [birth year]  DATE OF ADMISSION:  07/05/2017   ADMITTING PHYSICIAN: Marguarite ArbourJeffrey D Sparks, MD  DATE OF DISCHARGE: 07/07/2017 PRIMARY CARE PHYSICIAN: Gabriel CirriWicker, Cheryl, NP   ADMISSION DIAGNOSIS:  heart failure DISCHARGE DIAGNOSIS:  Principal Problem:   Hyponatremia Active Problems:   Chronic systolic congestive heart failure (HCC)   Cramps, muscle, general   Weakness generalized  SECONDARY DIAGNOSIS:   Past Medical History:  Diagnosis Date  . Anxiety   . CHF (congestive heart failure) (HCC)   . Chronic systolic congestive heart failure (HCC)   . CKD (chronic kidney disease) stage 4, GFR 15-29 ml/min (HCC)   . COPD (chronic obstructive pulmonary disease) (HCC)   . Dilated cardiomyopathy (HCC)   . Hyperlipidemia   . Hypertension   . MI (myocardial infarction) (HCC)   . Polycystic kidney   . Renal disorder    HOSPITAL COURSE:   Kelly Leonard is a 61 y.o. male has a past medical history significant for CAD, CHF, anxiety, and HTN and CKD now with progressive weakness and muscle cramps. Has been on increased diuretics for CHF recently. Has chronic SOB and DOE. In ER, pt's sodium was 118.   1. Hyponatremia due to diuretics ( Bumex) He is treated wtih normal saline 75 cc an hour, Na up to 123. Nephrology consultation requested May need TOLVAPTAN  2. Acute on hronic systolic heart failure ejection fraction 20%: worsening leg edema. Hold bumex due to worsening renal failure, continue metoprolol Per Dr. Kirke CorinArida, [facility] for advanced CHF treatment.  3. Acute on chronic kidney disease stage IV: Avoid nephrotoxic medications for now Hold bumex. F/u BMP.  4. Essential hypertension: Continue metoprolol  5. Anxiety: Discussed with patient and wife about alternative therapies such as meditation and yoga as well as the use of essential oils  which may help with his anxiety.   6. Hyperkalemia in the setting of acute kidney injury Given Kayexalate, f/u K. Discussed with Dr. Kirke CorinArida. DISCHARGE CONDITIONS:  Guarded, [facility]. CONSULTS OBTAINED:  Treatment Team:  Mosetta PigeonSingh, Harmeet, MD Antonieta IbaGollan, Timothy J, MD DRUG ALLERGIES:   Allergies  Allergen Reactions  . Entresto [Sacubitril-Valsartan] Swelling    Swelling of the face  . Penicillins Anaphylaxis and Swelling    Has patient had a PCN reaction causing immediate rash, facial/tongue/throat swelling, SOB or lightheadedness with hypotension: Yes Has patient had a PCN reaction causing severe rash involving mucus membranes or skin necrosis: No Has patient had a PCN reaction that required hospitalization: No Has patient had a PCN reaction occurring within the last 10 years: No If all of the above answers are "NO", then may proceed with Cephalosporin use.   . Ace Inhibitors Rash  . Carvedilol Other (See Comments)    "dizziness, light headed, and nausea"  . Hydroxyzine Nausea Only  . Seroquel [Quetiapine] Nausea And Vomiting    Hives   . Sertraline Diarrhea    itching  . Trazodone And Nefazodone Other (See Comments)    "up for days"  . Lorazepam Rash    Rash and severe anxiety  . Torsemide Hives, Itching, Nausea And Vomiting and Rash   DISCHARGE MEDICATIONS:   Allergies as of 07/07/2017      Reactions   Entresto [sacubitril-valsartan] Swelling   Swelling of the face   Penicillins Anaphylaxis, Swelling   Has patient had a  PCN reaction causing immediate rash, facial/tongue/throat swelling, SOB or lightheadedness with hypotension: Yes Has patient had a PCN reaction causing severe rash involving mucus membranes or skin necrosis: No Has patient had a PCN reaction that required hospitalization: No Has patient had a PCN reaction occurring within the last 10 years: No If all of the above answers are "NO", then may proceed with Cephalosporin use.   Ace  Inhibitors Rash   Carvedilol Other (See Comments)   "dizziness, light headed, and nausea"   Hydroxyzine Nausea Only   Seroquel [quetiapine] Nausea And Vomiting   Hives   Sertraline Diarrhea   itching   Trazodone And Nefazodone Other (See Comments)   "up for days"   Lorazepam Rash   Rash and severe anxiety   Torsemide Hives, Itching, Nausea And Vomiting, Rash      Medication List    STOP taking these medications   metolazone 2.5 MG tablet Commonly known as:  ZAROXOLYN     TAKE these medications   aspirin EC 81 MG tablet Take 81 mg by mouth daily.   b complex vitamins tablet Take 1 tablet by mouth daily.   Melatonin 10 MG Caps Take 10 mg by mouth at bedtime.   metoprolol succinate 25 MG 24 hr tablet Commonly known as:  TOPROL-XL Take 25 mg by mouth daily.   multivitamin with minerals tablet Take 1 tablet by mouth daily.        DISCHARGE INSTRUCTIONS:  See AVS.  If you experience worsening of your admission symptoms, develop shortness of breath, life threatening emergency, suicidal or homicidal thoughts you must seek medical attention immediately by calling 911 or calling your MD immediately  if symptoms less severe.  You Must read complete instructions/literature along with all the possible adverse reactions/side effects for all the Medicines you take and that have been prescribed to you. Take any new Medicines after you have completely understood and accpet all the possible adverse reactions/side effects.   Please note  You were cared for by a hospitalist during your hospital stay. If you have any questions about your discharge medications or the care you received while you were in the hospital after you are discharged, you can call the unit and asked to speak with the hospitalist on call if the hospitalist that took care of you is not available. Once you are discharged, your primary care physician will handle any further medical issues. Please note that NO REFILLS  for any discharge medications will be authorized once you are discharged, as it is imperative that you return to your primary care physician (or establish a relationship with a primary care physician if you do not have one) for your aftercare needs so that they can reassess your need for medications and monitor your lab values.    On the day of Discharge:  VITAL SIGNS:  Blood pressure 123/89, pulse 92, temperature (!) 97.5 F (36.4 C), temperature source Oral, resp. rate 17, height 5\' 9"  (1.753 m), weight 139 lb 3.2 oz (63.1 kg), SpO2 100 %. PHYSICAL EXAMINATION:  GENERAL:  61 y.o.-year-old patient lying in the bed with no acute distress.  EYES: Pupils equal, round, reactive to light and accommodation. No scleral icterus. Extraocular muscles intact.  HEENT: Head atraumatic, normocephalic. Oropharynx and nasopharynx clear.  NECK:  Supple, no jugular venous distention. No thyroid enlargement, no tenderness.  LUNGS: Normal breath sounds bilaterally, no wheezing, rales,rhonchi or crepitation. No use of accessory muscles of respiration.  CARDIOVASCULAR: S1, S2 normal. No murmurs,  rubs, or gallops.  ABDOMEN: Soft, non-tender, non-distended. Bowel sounds present. No organomegaly or mass.  EXTREMITIES: No cyanosis, or clubbing. Leg edema 3+/ NEUROLOGIC: Cranial nerves II through XII are intact. Muscle strength 5/5 in all extremities. Sensation intact. Gait not checked.  PSYCHIATRIC: The patient is alert and oriented x 3.  SKIN: No obvious rash, lesion, or ulcer.  DATA REVIEW:   CBC  Recent Labs Lab 07/06/17 0433  WBC 8.3  HGB 11.9*  HCT 37.3*  PLT 418    Chemistries   Recent Labs Lab 07/06/17 0433  07/07/17 0322 07/07/17 0832  NA 122*  < > 123* 123*  K 5.5*  --  5.3*  --   CL 81*  --  85*  --   CO2 22  --  24  --   GLUCOSE 100*  --  101*  --   BUN 95*  --  100*  --   CREATININE 3.96*  --  4.14*  --   CALCIUM 9.3  --  8.9  --   AST 146*  --   --   --   ALT 79*  --   --   --     ALKPHOS 60  --   --   --   BILITOT 1.6*  --   --   --   < > = values in this interval not displayed.   Microbiology Results  Results for orders placed or performed during the hospital encounter of 06/12/15  Surgical pcr screen     Status: None   Collection Time: 06/12/15  9:06 AM  Result Value Ref Range Status   MRSA, PCR NEGATIVE NEGATIVE Final   Staphylococcus aureus NEGATIVE NEGATIVE Final    Comment:        The Xpert SA Assay (FDA approved for NASAL specimens in patients over 88 years of age), is one component of a comprehensive surveillance program.  Test performance has been validated by [facility] for patients greater than or equal to 32 year old. It is not intended to diagnose infection nor to guide or monitor treatment.     RADIOLOGY:  No results found.   Management plans discussed with the patient, family and they are in agreement.  CODE STATUS: Full Code   TOTAL TIME TAKING CARE OF THIS PATIENT: 35 minutes.    Shaune Pollack M.D on 07/07/2017 at 11:01 AM  Between 7am to 6pm - Pager - [phone]  After 6pm go to [email or web address] - Social research officer, government  [facility] [place] [facility]  Office  [phone]  CC: Primary care physician; Gabriel Cirri, NP   Note: This dictation was prepared with Dragon dictation along with smaller phrase technology. Any transcriptional errors that result from this process are unintentional.

## 2017-07-07 NOTE — Progress Notes (Signed)
Patient having concerns about Bumex. Felt legs were continuing to swell and that he wasn't urinating as frequent as with lasix. Requested additional dose of diuretic for evening. MD notified. Given lasix 20 mg pill once. Educated about current plan of care. Verbalized understanding. Will monitor.

## 2017-07-07 NOTE — CV Procedure (Signed)
RHC numbers  Findings:  RA = 12 PA =   48/22 (32) PCW = 14 Thermo cardiac output/index = 4.3/2.4 Fick CO/CI = 2.7/1.6 FA sat = 96% PA sat = 41%  Normal PCWP with elevated CVP.   Cold and dry by Fick.   Start dobutamine.   Arvilla Meres, MD  7:32 PM

## 2017-07-07 NOTE — Plan of Care (Signed)
Problem: Activity: Goal: Risk for activity intolerance will decrease Outcome: Progressing Pt was able to ambulate to bathroom without difficulty or shortness of breath

## 2017-07-07 NOTE — Progress Notes (Signed)
Orders to transfer pt to [facility]/ report called to [facility]/ [facility] to transport.

## 2017-07-08 ENCOUNTER — Inpatient Hospital Stay (HOSPITAL_COMMUNITY): Payer: BLUE CROSS/BLUE SHIELD

## 2017-07-08 ENCOUNTER — Ambulatory Visit: Payer: BLUE CROSS/BLUE SHIELD | Admitting: Gastroenterology

## 2017-07-08 ENCOUNTER — Telehealth: Payer: Self-pay | Admitting: Gastroenterology

## 2017-07-08 DIAGNOSIS — R531 Weakness: Secondary | ICD-10-CM

## 2017-07-08 DIAGNOSIS — F408 Other phobic anxiety disorders: Secondary | ICD-10-CM

## 2017-07-08 DIAGNOSIS — N170 Acute kidney failure with tubular necrosis: Secondary | ICD-10-CM

## 2017-07-08 DIAGNOSIS — R451 Restlessness and agitation: Secondary | ICD-10-CM

## 2017-07-08 DIAGNOSIS — N184 Chronic kidney disease, stage 4 (severe): Secondary | ICD-10-CM

## 2017-07-08 DIAGNOSIS — F4323 Adjustment disorder with mixed anxiety and depressed mood: Secondary | ICD-10-CM

## 2017-07-08 DIAGNOSIS — F419 Anxiety disorder, unspecified: Secondary | ICD-10-CM

## 2017-07-08 DIAGNOSIS — R252 Cramp and spasm: Secondary | ICD-10-CM

## 2017-07-08 DIAGNOSIS — I5023 Acute on chronic systolic (congestive) heart failure: Secondary | ICD-10-CM

## 2017-07-08 DIAGNOSIS — E871 Hypo-osmolality and hyponatremia: Secondary | ICD-10-CM

## 2017-07-08 LAB — BASIC METABOLIC PANEL
Anion gap: 11 (ref 5–15)
Anion gap: 10 (ref 5–15)
BUN: 86 mg/dL — AB (ref 6–20)
BUN: 77 mg/dL — ABNORMAL HIGH (ref 6–20)
CALCIUM: 8.6 mg/dL — AB (ref 8.9–10.3)
CALCIUM: 8.5 mg/dL — AB (ref 8.9–10.3)
CHLORIDE: 91 mmol/L — AB (ref 101–111)
CHLORIDE: 97 mmol/L — AB (ref 101–111)
CO2: 27 mmol/L (ref 22–32)
CO2: 28 mmol/L (ref 22–32)
CREATININE: 3.35 mg/dL — AB (ref 0.61–1.24)
CREATININE: 3.25 mg/dL — AB (ref 0.61–1.24)
GFR calc Af Amer: 21 mL/min — ABNORMAL LOW (ref >60)
GFR calc Af Amer: 22 mL/min — ABNORMAL LOW (ref >60)
GFR calc non Af Amer: 19 mL/min — ABNORMAL LOW (ref >60)
GFR calc non Af Amer: 19 mL/min — ABNORMAL LOW (ref >60)
GLUCOSE: 103 mg/dL — AB (ref 65–99)
GLUCOSE: 110 mg/dL — AB (ref 65–99)
Potassium: 3.5 mmol/L (ref 3.5–5.1)
Potassium: 3.7 mmol/L (ref 3.5–5.1)
Sodium: 129 mmol/L — ABNORMAL LOW (ref 135–145)
Sodium: 135 mmol/L (ref 135–145)

## 2017-07-08 LAB — URINALYSIS, ROUTINE W REFLEX MICROSCOPIC
Bilirubin Urine: NEGATIVE
GLUCOSE, UA: NEGATIVE mg/dL
KETONES UR: NEGATIVE mg/dL
NITRITE: NEGATIVE
PH: 7 (ref 5.0–8.0)
PROTEIN: 30 mg/dL — AB
Specific Gravity, Urine: 1.006 (ref 1.005–1.030)
Squamous Epithelial / LPF: NONE SEEN

## 2017-07-08 LAB — COOXEMETRY PANEL
Carboxyhemoglobin: 1.2 % (ref 0.5–1.5)
Carboxyhemoglobin: 1.2 % (ref 0.5–1.5)
Methemoglobin: 1.3 % (ref 0.0–1.5)
Methemoglobin: 1.2 % (ref 0.0–1.5)
O2 SAT: 45.3 %
O2 Saturation: 55.9 %
Total hemoglobin: 9.2 g/dL — ABNORMAL LOW (ref 12.0–16.0)
Total hemoglobin: 11.5 g/dL — ABNORMAL LOW (ref 12.0–16.0)

## 2017-07-08 MED ORDER — FUROSEMIDE 10 MG/ML IJ SOLN
80.0000 mg | Freq: Two times a day (BID) | INTRAMUSCULAR | Status: DC
  Administered 2017-07-08 – 2017-07-09 (×3): 80 mg via INTRAVENOUS
  Filled 2017-07-08 (×4): qty 8

## 2017-07-08 MED ORDER — SODIUM CHLORIDE 0.9% FLUSH: 3.0000 mL | Freq: Two times a day (BID) | INTRAVENOUS | Status: DC

## 2017-07-08 MED ORDER — LEVOFLOXACIN IN D5W 250 MG/50ML IV SOLN
250.0000 mg | INTRAVENOUS | Status: DC
  Administered 2017-07-09 – 2017-07-10 (×2): 250 mg via INTRAVENOUS
  Filled 2017-07-08 (×2): qty 50

## 2017-07-08 MED ORDER — ALPRAZOLAM 0.5 MG PO TABS
0.5000 mg | ORAL_TABLET | Freq: Three times a day (TID) | ORAL | Status: DC
  Administered 2017-07-09 – 2017-07-15 (×19): 0.5 mg via ORAL
  Filled 2017-07-08 (×19): qty 1

## 2017-07-08 MED ORDER — POTASSIUM CHLORIDE 10 MEQ/50ML IV SOLN
10.0000 meq | INTRAVENOUS | Status: AC
  Administered 2017-07-08 (×2): 10 meq via INTRAVENOUS
  Filled 2017-07-08 (×2): qty 50

## 2017-07-08 MED ORDER — SODIUM CHLORIDE 0.9 % IV SOLN: 250.0000 mL | INTRAVENOUS | Status: DC | PRN

## 2017-07-08 MED ORDER — POTASSIUM CHLORIDE CRYS ER 20 MEQ PO TBCR: 60.0000 meq | EXTENDED_RELEASE_TABLET | Freq: Once | ORAL | Status: DC

## 2017-07-08 MED ORDER — ALPRAZOLAM 0.5 MG PO TABS
0.5000 mg | ORAL_TABLET | Freq: Once | ORAL | Status: AC
  Administered 2017-07-08: 0.5 mg via ORAL
  Filled 2017-07-08: qty 1

## 2017-07-08 MED ORDER — POTASSIUM CHLORIDE 10 MEQ/50ML IV SOLN
10.0000 meq | INTRAVENOUS | Status: AC
  Administered 2017-07-08 (×6): 10 meq via INTRAVENOUS
  Filled 2017-07-08 (×6): qty 50

## 2017-07-08 MED ORDER — ASPIRIN 81 MG PO CHEW: 81.0000 mg | CHEWABLE_TABLET | ORAL | Status: DC

## 2017-07-08 MED ORDER — CHLORHEXIDINE GLUCONATE CLOTH 2 % EX PADS
6.0000 | MEDICATED_PAD | Freq: Every day | CUTANEOUS | Status: DC
  Administered 2017-07-08 – 2017-07-15 (×8): 6 via TOPICAL

## 2017-07-08 MED ORDER — LEVOFLOXACIN IN D5W 500 MG/100ML IV SOLN
500.0000 mg | Freq: Once | INTRAVENOUS | Status: AC
  Administered 2017-07-08: 500 mg via INTRAVENOUS
  Filled 2017-07-08: qty 100

## 2017-07-08 MED ORDER — POTASSIUM CHLORIDE 20 MEQ/15ML (10%) PO SOLN: 20.0000 meq | Freq: Once | ORAL | Status: DC

## 2017-07-08 MED ORDER — BUSPIRONE HCL 5 MG PO TABS
5.0000 mg | ORAL_TABLET | Freq: Three times a day (TID) | ORAL | Status: DC
  Administered 2017-07-09 – 2017-07-15 (×19): 5 mg via ORAL
  Filled 2017-07-08 (×19): qty 1

## 2017-07-08 MED ORDER — DEXMEDETOMIDINE HCL IN NACL 400 MCG/100ML IV SOLN
0.4000 ug/kg/h | INTRAVENOUS | Status: DC
  Administered 2017-07-08: 0.3 ug/kg/h via INTRAVENOUS
  Administered 2017-07-08: 1 ug/kg/h via INTRAVENOUS
  Administered 2017-07-08: 0.7 ug/kg/h via INTRAVENOUS
  Filled 2017-07-08 (×4): qty 100

## 2017-07-08 MED ORDER — SODIUM CHLORIDE 0.9 % IV SOLN: INTRAVENOUS | Status: DC

## 2017-07-08 MED ORDER — GABAPENTIN 300 MG PO CAPS
300.0000 mg | ORAL_CAPSULE | Freq: Three times a day (TID) | ORAL | Status: DC
  Administered 2017-07-09 – 2017-07-10 (×6): 300 mg via ORAL
  Filled 2017-07-08 (×6): qty 1

## 2017-07-08 MED ORDER — SODIUM CHLORIDE 0.9% FLUSH: 3.0000 mL | INTRAVENOUS | Status: DC | PRN

## 2017-07-08 NOTE — Progress Notes (Signed)
Pt instructed multiple times about moving around in the bed with swan ganz cathter. Pt continues to complain of anxiety and "needing to sit up to pee." Pt non complaint with instructions and continues to move and pull at neck. Will continue to monitor closely. Modena Jansky RN 2 Heart

## 2017-07-08 NOTE — Consult Note (Signed)
[facility] Consult   Reason for Consult:, Agitation, uncontrollable anxiety and insomnia Referring Physician: Dr. Haroldine Laws Patient Identification: Kelly Leonard MRN:  [id number] Principal Diagnosis: <principal problem not specified> Diagnosis:   Patient Active Problem List   Diagnosis Date Noted  . Acute on chronic systolic heart failure ([place]) [I50.23] 07/07/2017  . Acute renal failure with acute tubular necrosis superimposed on stage 4 chronic kidney disease ([place]) [N17.0, N18.4]   . Hyponatremia [E87.1] 07/05/2017  . Cramps, muscle, general [R25.2] 07/05/2017  . Weakness generalized [R53.1] 07/05/2017  . Acute exacerbation of CHF (congestive heart failure) ([place]) [I50.9] 06/24/2017  . Acute on chronic systolic (congestive) heart failure ([place]) [I50.23] 05/12/2017  . Insomnia [G47.00] 05/05/2017  . Advanced care planning/counseling discussion [Z71.89] 03/24/2017  . Hepatitis C antibody positive in blood [R76.8] 02/17/2017  . Hypertension [I10]   . Hyperlipidemia [E78.5]   . Chronic systolic congestive heart failure ([place]) [I50.22]   . Dilated cardiomyopathy ([place]) [I42.0]   . Polycystic kidney [Q61.3]   . CKD (chronic kidney disease) stage 4, GFR 15-29 ml/min (HCC) [N18.4]   . 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 Anxiety disorder, unspecified [F41.9] 03/03/2013    Total Time spent with patient: 30 minutes  Subjective:   Kelly Leonard is a 61 y.o. male patient admitted with acute anxiety and agitation.  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  Patient is on sedation due to current treatment of Precedex and not able to provide history.  Information for this evaluation obtained from the staff RN and patient wife who is at bedside. Patient  wife reported that he has been suffering with this health problems over to 1/2 months and has been seeing multiple medical doctors who has been giving multiple medications none of them really helped him to control his anxiety or insomnia.  Reportedly patient was given a trial of lorazepam, trazodone, Ambien, hydroxyzine, sertraline, Seroquel none of them helpful.  Reportedly he was taking gabapentin which helped him only 3 days. .   Medical history: Kelly Leonard is seen today for evaluation of heart failure at the request of  Dr Fletcher Anon..   Kelly Leonard is a 61 year old with a history of severe systolic HF EF 75% due to NICM (normal coronaries on cath 03/09/13) s/p ICD, CKD Stage IV (baseline creatinine 2.8-3.0), HTN and anxiety.   Reports a long h/o CHF but can't tell me who his heart doctor is. In [place] it seems as if he has been seen by [facility] and [facility] Has also followed with [facility] with Dr. Johnney Ou.   Recently admitted October 16th at [facility] with A/C systolic heart failure. Discharged on bumex 3 mg twice a day. D/C weight 124 pounds. Says he cannot take a lot of HF meds due to allergies.   In October he was evaluated ED x4 with insomnia, hyponatremia and  confusion.   Admitted to [facility] October 27th with weakness and cramps and Class IV HF symptoms. Sodium on admit was 123. Treated with NS IV fluids. Diuretics held. Creatinine has been trending up 2.8>4.14. Unable to diurese. Transferred to [facility] today for advanced heart failure care.   Complaining of fatigue. SOB with any exertion. Denies CP. 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 Denies  ETOH, drugs or cigarette use.   Past Psychiatric History: Patient has no history of mental illness.  Risk to Self:   Risk to Others:   Prior Inpatient Therapy:   Prior Outpatient Therapy:    Past Medical History:  Past Medical History:  Diagnosis Date  . Anxiety   . CHF (congestive  heart failure) ([place])   . Chronic systolic congestive heart failure ([place])   . CKD (chronic kidney disease) stage 4, GFR 15-29 ml/min (HCC)   . COPD (chronic obstructive pulmonary disease) ([place])   . Dilated cardiomyopathy ([place])   . Hyperlipidemia   . Hypertension   . MI (myocardial infarction) ([place])   . Polycystic kidney   . Renal disorder     Past Surgical History:  Procedure Laterality Date  . CARDIAC CATHETERIZATION    . IMPLANTABLE CARDIOVERTER DEFIBRILLATOR (ICD) GENERATOR CHANGE Right 06/23/2015   Procedure: ICD GENERATOR  INITIAL IMPLANT;  Surgeon: Marzetta Board, MD;  Location: [facility] ORS;  Service: Cardiovascular;  Laterality: Right;   Family History:  Family History  Problem Relation Age of Onset  . Diabetes Mother   . Hypertension Brother   . Heart disease Maternal Grandmother   . Heart attack Maternal Grandmother    Family Psychiatric  History: Patient family denied family history of mental illness.  Social History:  History  Alcohol Use No     History  Drug Use No    Social History   Social History  . Marital status: Single    Spouse name: N/A  . Number of children: N/A  . Years of education: N/A   Social History Main Topics  . Smoking status: Never Smoker  . Smokeless tobacco: Never Used  . Alcohol use No  . Drug use: No  . Sexual activity: Yes   Other Topics Concern  . Not on file   Social History Narrative  . No narrative on file   Additional Social History:    Allergies:   Allergies  Allergen Reactions  . Entresto [Sacubitril-Valsartan] Swelling    Swelling of the face  . Penicillins Anaphylaxis and Swelling    Has patient had a PCN reaction causing immediate rash, facial/tongue/throat swelling, SOB or lightheadedness with hypotension: Yes Has patient had a PCN reaction causing severe rash involving mucus membranes or skin necrosis: No Has patient had a PCN reaction that required hospitalization: No Has patient had a PCN reaction  occurring within the last 10 years: No If all of the above answers are "NO", then may proceed with Cephalosporin use.   . Seroquel [Quetiapine] Nausea And Vomiting and Hives    Hives   . Ace Inhibitors Rash  . Carvedilol Other (See Comments)    "dizziness, light headed, and nausea"  . Hydroxyzine Nausea Only  . Sertraline Diarrhea    itching  . Trazodone And Nefazodone Other (See Comments)    "up for days"  . Lorazepam Rash    Rash and severe anxiety  . Torsemide Hives, Itching, Nausea And Vomiting and Rash    Labs:  Results for orders placed or performed during the hospital encounter of 07/07/17 (from the past 48 hour(s))  MRSA PCR Screening     Status: None   Collection Time: 07/07/17  1:02 PM  Result Value Ref Range   MRSA by PCR NEGATIVE NEGATIVE    Comment:        The GeneXpert MRSA Assay (FDA approved for NASAL specimens only), is one component of a comprehensive MRSA colonization surveillance  program. It is not intended to diagnose MRSA infection nor to guide or monitor treatment for MRSA infections.   Protime-INR     Status: None   Collection Time: 07/07/17  2:07 PM  Result Value Ref Range   Prothrombin Time 15.1 11.4 - 15.2 seconds   INR 1.20   CBC WITH DIFFERENTIAL     Status: Abnormal   Collection Time: 07/07/17  2:50 PM  Result Value Ref Range   WBC 6.4 4.0 - 10.5 K/uL   RBC 4.64 4.22 - 5.81 MIL/uL   Hemoglobin 11.3 (L) 13.0 - 17.0 g/dL   HCT 34.5 (L) 39.0 - 52.0 %   MCV 74.4 (L) 78.0 - 100.0 fL   MCH 24.4 (L) 26.0 - 34.0 pg   MCHC 32.8 30.0 - 36.0 g/dL   RDW 16.1 (H) 11.5 - 15.5 %   Platelets 416 (H) 150 - 400 K/uL   Neutrophils Relative % 69 %   Neutro Abs 4.4 1.7 - 7.7 K/uL   Lymphocytes Relative 21 %   Lymphs Abs 1.3 0.7 - 4.0 K/uL   Monocytes Relative 9 %   Monocytes Absolute 0.5 0.1 - 1.0 K/uL   Eosinophils Relative 1 %   Eosinophils Absolute 0.1 0.0 - 0.7 K/uL   Basophils Relative 0 %   Basophils Absolute 0.0 0.0 - 0.1 K/uL   Comprehensive metabolic panel     Status: Abnormal   Collection Time: 07/07/17  2:50 PM  Result Value Ref Range   Sodium 127 (L) 135 - 145 mmol/L   Potassium 4.0 3.5 - 5.1 mmol/L   Chloride 86 (L) 101 - 111 mmol/L   CO2 24 22 - 32 mmol/L   Glucose, Bld 89 65 - 99 mg/dL   BUN 100 (H) 6 - 20 mg/dL   Creatinine, Ser 3.91 (H) 0.61 - 1.24 mg/dL   Calcium 8.9 8.9 - 10.3 mg/dL   Total Protein 6.5 6.5 - 8.1 g/dL   Albumin 3.0 (L) 3.5 - 5.0 g/dL   AST 188 (H) 15 - 41 U/L   ALT 108 (H) 17 - 63 U/L   Alkaline Phosphatase 67 38 - 126 U/L   Total Bilirubin 1.2 0.3 - 1.2 mg/dL   GFR calc non Af Amer 15 (L) >60 mL/min   GFR calc Af Amer 18 (L) >60 mL/min    Comment: (NOTE) The eGFR has been calculated using the CKD EPI equation. This calculation has not been validated in all clinical situations. eGFR's persistently <60 mL/min signify possible Chronic Kidney Disease.    Anion gap 17 (H) 5 - 15  Magnesium     Status: None   Collection Time: 07/07/17  2:50 PM  Result Value Ref Range   Magnesium 2.3 1.7 - 2.4 mg/dL  .Cooxemetry Panel (carboxy, met, total hgb, O2 sat)     Status: Abnormal   Collection Time: 07/07/17  6:30 PM  Result Value Ref Range   Total hemoglobin 10.6 (L) 12.0 - 16.0 g/dL   O2 Saturation 41.1 %   Carboxyhemoglobin 1.3 0.5 - 1.5 %   Methemoglobin 1.0 0.0 - 1.5 %  Basic metabolic panel     Status: Abnormal   Collection Time: 07/08/17  4:36 AM  Result Value Ref Range   Sodium 129 (L) 135 - 145 mmol/L   Potassium 3.5 3.5 - 5.1 mmol/L   Chloride 91 (L) 101 - 111 mmol/L   CO2 27 22 - 32 mmol/L   Glucose, Bld 103 (H) 65 - 99 mg/dL  BUN 86 (H) 6 - 20 mg/dL   Creatinine, Ser 3.35 (H) 0.61 - 1.24 mg/dL   Calcium 8.6 (L) 8.9 - 10.3 mg/dL   GFR calc non Af Amer 19 (L) >60 mL/min   GFR calc Af Amer 21 (L) >60 mL/min    Comment: (NOTE) The eGFR has been calculated using the CKD EPI equation. This calculation has not been validated in all clinical situations. eGFR's  persistently <60 mL/min signify possible Chronic Kidney Disease.    Anion gap 11 5 - 15  .Cooxemetry Panel (carboxy, met, total hgb, O2 sat)     Status: Abnormal   Collection Time: 07/08/17  4:45 AM  Result Value Ref Range   Total hemoglobin 9.2 (L) 12.0 - 16.0 g/dL   O2 Saturation 55.9 %   Carboxyhemoglobin 1.2 0.5 - 1.5 %   Methemoglobin 1.3 0.0 - 1.5 %  Urinalysis, Routine w reflex microscopic     Status: Abnormal   Collection Time: 07/08/17  6:31 AM  Result Value Ref Range   Color, Urine YELLOW YELLOW   APPearance CLEAR CLEAR   Specific Gravity, Urine 1.006 1.005 - 1.030   pH 7.0 5.0 - 8.0   Glucose, UA NEGATIVE NEGATIVE mg/dL   Hgb urine dipstick LARGE (A) NEGATIVE   Bilirubin Urine NEGATIVE NEGATIVE   Ketones, ur NEGATIVE NEGATIVE mg/dL   Protein, ur 30 (A) NEGATIVE mg/dL   Nitrite NEGATIVE NEGATIVE   Leukocytes, UA LARGE (A) NEGATIVE   RBC / HPF TOO NUMEROUS TO COUNT 0 - 5 RBC/hpf   WBC, UA TOO NUMEROUS TO COUNT 0 - 5 WBC/hpf   Bacteria, UA FEW (A) NONE SEEN   Squamous Epithelial / LPF NONE SEEN NONE SEEN   WBC Clumps PRESENT    Mucus PRESENT     Current Facility-Administered Medications  Medication Dose Route Frequency Provider Last Rate Last Dose  . 0.9 %  sodium chloride infusion  250 mL Intravenous PRN Clegg, Amy D, NP      . acetaminophen (TYLENOL) tablet 650 mg  650 mg Oral Q4H PRN Clegg, Amy D, NP      . ALPRAZolam Duanne Moron) tablet 0.5 mg  0.5 mg Oral TID Ambrose Finland, MD      . busPIRone (BUSPAR) tablet 5 mg  5 mg Oral TID Ambrose Finland, MD      . Chlorhexidine Gluconate Cloth 2 % PADS 6 each  6 each Topical Daily Bensimhon, Shaune Pascal, MD   6 each at 07/08/17 1415  . dexmedetomidine (PRECEDEX) 400 MCG/100ML (4 mcg/mL) infusion  0.4-1.2 mcg/kg/hr Intravenous Titrated Bensimhon, Shaune Pascal, MD 12 mL/hr at 07/08/17 1432 0.8 mcg/kg/hr at 07/08/17 1432  . DOBUTamine (DOBUTREX) infusion 4000 mcg/mL  2.5 mcg/kg/min Intravenous Titrated Bensimhon,  Daniel R, MD 2.4 mL/hr at 07/08/17 0800 2.5 mcg/kg/min at 07/08/17 0800  . enoxaparin (LOVENOX) injection 30 mg  30 mg Subcutaneous Q24H Clegg, Amy D, NP   30 mg at 07/08/17 1430  . furosemide (LASIX) injection 80 mg  80 mg Intravenous BID Bensimhon, Shaune Pascal, MD   80 mg at 07/08/17 0803  . gabapentin (NEURONTIN) capsule 300 mg  300 mg Oral TID Ambrose Finland, MD      . ondansetron (ZOFRAN) injection 4 mg  4 mg Intravenous Q6H PRN Clegg, Amy D, NP      . potassium chloride 10 mEq in 50 mL *CENTRAL LINE* IVPB  10 mEq Intravenous Q1 Hr x 6 Tyriek, [place], [facility]   Stopped at 07/08/17 1514  .  sodium chloride flush (NS) 0.9 % injection 3 mL  3 mL Intravenous Q12H Clegg, Amy D, NP   3 mL at 07/08/17 0900  . sodium chloride flush (NS) 0.9 % injection 3 mL  3 mL Intravenous PRN Clegg, Amy D, NP        Musculoskeletal: Strength & Muscle Tone: decreased Gait & Station: unable to stand Patient leans: N/A  Psychiatric Specialty Exam: Physical Exam as per history and physical   ROS unable to perform due to sedation secondary to medication  Blood pressure 118/85, pulse 80, temperature 99.1 F (37.3 C), resp. rate 19, height 5' 9" (1.753 m), weight 60.2 kg (132 lb 11.5 oz), SpO2 99 %.Body mass index is 19.6 kg/m.  General Appearance: Casual  Eye Contact:  None  Speech:  NA  Volume:  NA  Mood:  Anxious  Affect:  Labile  Thought Process:  NA  Orientation:  NA  Thought Content:  NA  Suicidal Thoughts:  No  Homicidal Thoughts:  No  Memory:  NA  Judgement:  NA  Insight:  NA  Psychomotor Activity:  NA  Concentration:  Concentration: NA and Attention Span: NA  Recall:  NA  Fund of Knowledge:  NA  Language:  NA  Akathisia:  NA  Handed:  TBD  AIMS (if indicated):     Assets:  Others:  TBD  ADL's:  Others:  TBD  Cognition:    Sleep:        Treatment Plan Summary: 61 years old male admitted from the [facility] for acute renal failure and heart failure which  leads to shortness of breath and unable to relax or sleep resulted in being extremely anxious, scared to sleep.  And patient is placed on Precedex drip to calm him down.  Anxiety disorder due to general medical condition  Recommendation: We will start alprazolam 0.5 mg 3 times daily  will start Gabapentin 300 mg 3 times daily We will start BuSpar 5 mg 3 times daily Appreciate psychiatric consultation and follow up as clinically required Please contact [phone] or [phone] if needs further assistance  Disposition: No evidence of imminent risk to self or others at present.   Patient does not meet criteria for psychiatric inpatient admission. Supportive therapy provided about ongoing stressors.  Ambrose Finland, MD 07/08/2017 3:37 PM

## 2017-07-08 NOTE — Progress Notes (Addendum)
Advanced Heart Failure Rounding Note   Subjective:    Swan placed last night. Statrted on dobutamine for low cardiac output. (co-ox 41%)  Has been anxious and agitate all night. Pulling at lines and unscrewing stop cocks. Denies CP.   Last swan numbers  CVP 19 PA 62/31 PCWP 21 Thermo 3.3/1.9 SVR 1900  Co-ox this am 56% BMET pending  Echo 07/07/17 EF 20-25% mod MR    Objective:   Weight Range:  Vital Signs:   Temp:  [97.2 F (36.2 C)-97.8 F (36.6 C)] 97.2 F (36.2 C) (10/30 0400) Pulse Rate:  [56-112] 92 (10/30 0400) Resp:  [11-22] 16 (10/30 0400) BP: (113-152)/(73-113) 126/93 (10/30 0400) SpO2:  [92 %-100 %] 100 % (10/30 0400) Weight:  [60.2 kg (132 lb 11.5 oz)-63.2 kg (139 lb 5.3 oz)] 60.2 kg (132 lb 11.5 oz) (10/30 0437) Last BM Date: 07/07/17  Weight change: Filed Weights   07/07/17 1300 07/08/17 0437  Weight: 63.2 kg (139 lb 5.3 oz) 60.2 kg (132 lb 11.5 oz)    Intake/Output:   Intake/Output Summary (Last 24 hours) at 07/08/17 0512 Last data filed at 07/08/17 0400  Gross per 24 hour  Intake            72.84 ml  Output             1625 ml  Net         -1552.16 ml     Physical Exam: General:  Thin male. Lying in bed. Constantly agitated and fidgeting HEENT: normal Neck: supple. RIJ swan Carotids 2+ bilat; no bruits. No lymphadenopathy or thryomegaly appreciated. Cor: PMI laterally displaced. Regular rate & rhythm. +s3 2/6 MR Lungs: clear Abdomen: soft, nontender, nondistended. No hepatosplenomegaly. No bruits or masses. Good bowel sounds. Extremities: no cyanosis, clubbing, rash, edema  + foley Neuro: alert & orientedx3, cranial nerves grossly intact. moves all 4 extremities w/o difficulty. Affect pleasant  Telemetry: NSR 90-100 Personally reviewed   Labs: Basic Metabolic Panel:  Recent Labs Lab 07/04/17 2226 07/05/17 1207 07/06/17 0433  07/06/17 1503 07/06/17 2046 07/07/17 0322 07/07/17 0832 07/07/17 1450  NA 123* 118* 122*  < >  118* 124* 123* 123* 127*  K 4.3 5.0 5.5*  --   --   --  5.3*  --  4.0  CL 86* 78* 81*  --   --   --  85*  --  86*  CO2 21* 22 22  --   --   --  24  --  24  GLUCOSE 93 123* 100*  --   --   --  101*  --  89  BUN 80* 92* 95*  --   --   --  100*  --  100*  CREATININE 3.24* 3.79* 3.96*  --   --   --  4.14*  --  3.91*  CALCIUM 8.1* 8.9 9.3  --   --   --  8.9  --  8.9  MG  --   --   --   --   --   --   --   --  2.3  < > = values in this interval not displayed.  Liver Function Tests:  Recent Labs Lab 07/06/17 0433 07/07/17 1450  AST 146* 188*  ALT 79* 108*  ALKPHOS 60 67  BILITOT 1.6* 1.2  PROT 7.3 6.5  ALBUMIN 3.4* 3.0*   No results for input(s): LIPASE, AMYLASE in the last 168 hours. No results for input(s):  AMMONIA in the last 168 hours.  CBC:  Recent Labs Lab 07/04/17 1932 07/05/17 1207 07/06/17 0433 07/07/17 1450  WBC 7.6 8.8 8.3 6.4  NEUTROABS 5.7  --   --  4.4  HGB 12.8* 12.3* 11.9* 11.3*  HCT 38.7* 37.8* 37.3* 34.5*  MCV 77.6* 76.5* 77.7* 74.4*  PLT 358 425 418 416*    Cardiac Enzymes:  Recent Labs Lab 07/04/17 1932 07/05/17 1207  TROPONINI 0.05* 0.06*    BNP: BNP (last 3 results)  Recent Labs  05/29/17 2152 06/05/17 1537 07/04/17 1932  BNP 3,608.0* 4,465.0* 3,665.0*    ProBNP (last 3 results) No results for input(s): PROBNP in the last 8760 hours.    Other results:  Imaging: Dg Chest Port 1v Same Day  Result Date: 07/07/2017 CLINICAL DATA:  Status post Swan-Ganz catheter placement EXAM: PORTABLE CHEST 1 VIEW COMPARISON:  07/05/2017 FINDINGS: Lungs are clear.  No pleural effusion or pneumothorax. Status post right IJ Swan-Ganz catheter placement with its distal tip in the right lower lobe pulmonary artery. Cardiomegaly.  Left subclavian ICD. IMPRESSION: Status post right IJ Swan-Ganz catheter placement with its distal tip in the right lower lobe pulmonary artery. Consider withdrawal approximately 5 cm into the right main pulmonary artery, as  clinically warranted. These results will be called to the ordering clinician or representative by the Radiologist Assistant, and communication documented in the PACS or zVision Dashboard. Electronically Signed   By: Charline Bills M.D.   On: 07/07/2017 18:11      Medications:     Scheduled Medications: . enoxaparin (LOVENOX) injection  30 mg Subcutaneous Q24H  . sodium chloride flush  3 mL Intravenous Q12H     Infusions: . sodium chloride    . DOBUTamine 2.5 mcg/kg/min (07/08/17 0300)     PRN Medications:  sodium chloride, acetaminophen, ALPRAZolam, ondansetron (ZOFRAN) IV, sodium chloride flush   Assessment:   Mr Kafer is a 61 year old with a history of NICM, CKD Stage III, HTN, anxiety, ICD, and chronic systolic heart failure.  Admitted with A/C systolic heart failure,    Plan/Discussion:     1. A/C Systolic Heart Failure  -> cardiogenic shock -NYHA IIIb. NICM. ECHO EF 05/2017 EF 20-25%.  -HF medication regimen will be difficult given several allergies.  -Swan placed. Numbers c/w biventricular failure and cardiogenic shock - Now on dobutamine - Volume status elevated. Continue IV diuresis  - Likely not canddidate for advanced therapies due to severe CKD and social situation/severe anxiety/noncomplaince - Will continue dobutamine for several days to see if we can improve renal function much. Baseline creatinine has been ~2.5-3.0 2. Hyponatremia - Asymptomatic - Improving with treatment of HF - Free water restrict - Poor prognostic facotr 3. AKI on CKD Stage IV - Creatinine baseline 2.8-3.0. - Likely component of cardiorenal syndrome - Will see how good renal function will get with intoropc support - Check UA, renal u/s, HEP panels and HIV 4.  Anxiety  - severe. - will consult psych - will start low-dose precedex so he doesn't hurt himself   CRITICAL CARE Performed by: Arvilla Meres  Total critical care time: 35 minutes  Critical care time  was exclusive of separately billable procedures and treating other patients.  Critical care was necessary to treat or prevent imminent or life-threatening deterioration.  Critical care was time spent personally by me (independent of midlevel providers or residents) on the following activities: development of treatment plan with patient and/or surrogate as well as nursing, discussions with consultants,  evaluation of patient's response to treatment, examination of patient, obtaining history from patient or surrogate, ordering and performing treatments and interventions, ordering and review of laboratory studies, ordering and review of radiographic studies, pulse oximetry and re-evaluation of patient's condition.    Length of Stay: 1   Aubryanna Nesheim 07/08/2017, 5:12 AM  Advanced Heart Failure Team Pager [phone] (M-F; 7a - 4p)  Please contact [facility] for night-coverage after hours (4p -7a ) and weekends on [email or web address]

## 2017-07-08 NOTE — Progress Notes (Signed)
CI : 1.77 down from 2.5, SvO2 : 45.3 down from 55.9. BP: 134/99 (109)  On dobu @ 2.5. Warm extremities, adequate UO with 80mg  Lasix BID. On call cardiologist notified. No orders received. Will continue to monitor.

## 2017-07-08 NOTE — Progress Notes (Signed)
   Psychiatry consult placed at the request of Dr Gala Romney for assistance with anxiety.  .  Dr Darleen Crocker contacted.   Keishia Ground NP-C  12:42 PM

## 2017-07-08 NOTE — Consult Note (Addendum)
[facility] Consult Note     Date: 07/08/2017                  Patient Name:  Kelly Leonard  MRN: [id number]  DOB: 10-Aug-1956  Age / Sex: 61 y.o., male         PCP: Kathrine Haddock, NP                 Service Requesting Consult: Dr. Haroldine Laws Heart Failure                 Reason for Consult: Acute on chronic CKD            Chief Complaint: Transfer for advanced heart therapies  HPI: Pt is a 44M with CKD, NICM EF 20%, s/o ICD, HTN, and anxiety now seen in consultation at the request of Dr. Haroldine Laws for evaluation and recommendations surrounding acute on chronic kidney disease.  His baseline Cr seems to be 2.90 as of 9/21.  He is followed by Dr. Candiss Norse in [place].    He has several recent admissions for fluid overload and hyponatremia.    Most recently admitted to [facility] with acute on chronic systolic HF.  Was given fluids with decompensation in hemodynamics.  Transferred to [facility] for advanced HF therapies.    Cr was 4.14.    Last night, he was Swanned with normal wedge, elevated CVP, and suboptimal Fick CO/CI.  Started on dobutamine.  UOP picked up, Cr down to 3.35 this AM. On Lasix 80 IV BID.    Renal US showing cystic disease (known).  Has severe anxiety and is on precedex gtt.  Past Medical History:  Diagnosis Date  . Anxiety   . CHF (congestive heart failure) ([place])   . Chronic systolic congestive heart failure ([place])   . CKD (chronic kidney disease) stage 4, GFR 15-29 ml/min (HCC)   . COPD (chronic obstructive pulmonary disease) ([place])   . Dilated cardiomyopathy ([place])   . Hyperlipidemia   . Hypertension   . MI (myocardial infarction) ([place])   . Polycystic kidney   . Renal disorder     Past Surgical History:  Procedure Laterality Date  . CARDIAC CATHETERIZATION    . IMPLANTABLE CARDIOVERTER DEFIBRILLATOR (ICD) GENERATOR CHANGE Right 06/23/2015   Procedure: ICD GENERATOR  INITIAL IMPLANT;  Surgeon: Marzetta Board, MD;  Location: [facility] ORS;  Service:  Cardiovascular;  Laterality: Right;    Family History  Problem Relation Age of Onset  . Diabetes Mother   . Hypertension Brother   . Heart disease Maternal Grandmother   . Heart attack Maternal Grandmother    Social History:  reports that he has never smoked. He has never used smokeless tobacco. He reports that he does not drink alcohol or use drugs.  Allergies:  Allergies  Allergen Reactions  . Entresto [Sacubitril-Valsartan] Swelling    Swelling of the face  . Penicillins Anaphylaxis and Swelling    Has patient had a PCN reaction causing immediate rash, facial/tongue/throat swelling, SOB or lightheadedness with hypotension: Yes Has patient had a PCN reaction causing severe rash involving mucus membranes or skin necrosis: No Has patient had a PCN reaction that required hospitalization: No Has patient had a PCN reaction occurring within the last 10 years: No If all of the above answers are "NO", then may proceed with Cephalosporin use.   . Seroquel [Quetiapine] Nausea And Vomiting and Hives    Hives   . Ace Inhibitors Rash  . Carvedilol Other (See Comments)    "  dizziness, light headed, and nausea"  . Hydroxyzine Nausea Only  . Sertraline Diarrhea    itching  . Trazodone And Nefazodone Other (See Comments)    "up for days"  . Lorazepam Rash    Rash and severe anxiety  . Torsemide Hives, Itching, Nausea And Vomiting and Rash    Medications Prior to Admission  Medication Sig Dispense Refill  . aspirin EC 81 MG tablet Take 81 mg by mouth daily.    Marland Kitchen b complex vitamins tablet Take 1 tablet by mouth daily.    . bumetanide (BUMEX) 1 MG tablet TAKE 3 TABLETS (3 MG TOTAL) BY MOUTH TWO (2) TIMES A DAY.  3  . carvedilol (COREG) 6.25 MG tablet Take by mouth.    . Cholecalciferol (VITAMIN D3) 1000 units CAPS Take by mouth.    Delene Loll 24-26 MG Take 1 tablet by mouth 2 (two) times daily.  5  . furosemide (LASIX) 40 MG tablet   0  . Melatonin 10 MG CAPS Take 10 mg by mouth at  bedtime.    . metolazone (ZAROXOLYN) 2.5 MG tablet   5  . metoprolol succinate (TOPROL-XL) 25 MG 24 hr tablet Take 25 mg by mouth daily.    . Multiple Vitamin (MULTI-VITAMINS) TABS Take by mouth.    . Multiple Vitamins-Minerals (MULTIVITAMIN WITH MINERALS) tablet Take 1 tablet by mouth daily.    . QUEtiapine (SEROQUEL) 50 MG tablet   0  . sertraline (ZOLOFT) 25 MG tablet Take by mouth.    . traZODone (DESYREL) 50 MG tablet Take 50 mg by mouth at bedtime as needed. for sleep  3  . valsartan (DIOVAN) 160 MG tablet Take by mouth.    . zolpidem (AMBIEN) 5 MG tablet TAKE 1 TABLET BY MOUTH AT BEDTIME AS NEEDED SLEEP  0    Results for orders placed or performed during the hospital encounter of 07/07/17 (from the past 48 hour(s))  MRSA PCR Screening     Status: None   Collection Time: 07/07/17  1:02 PM  Result Value Ref Range   MRSA by PCR NEGATIVE NEGATIVE    Comment:        The GeneXpert MRSA Assay (FDA approved for NASAL specimens only), is one component of a comprehensive MRSA colonization surveillance program. It is not intended to diagnose MRSA infection nor to guide or monitor treatment for MRSA infections.   Protime-INR     Status: None   Collection Time: 07/07/17  2:07 PM  Result Value Ref Range   Prothrombin Time 15.1 11.4 - 15.2 seconds   INR 1.20   CBC WITH DIFFERENTIAL     Status: Abnormal   Collection Time: 07/07/17  2:50 PM  Result Value Ref Range   WBC 6.4 4.0 - 10.5 K/uL   RBC 4.64 4.22 - 5.81 MIL/uL   Hemoglobin 11.3 (L) 13.0 - 17.0 g/dL   HCT 34.5 (L) 39.0 - 52.0 %   MCV 74.4 (L) 78.0 - 100.0 fL   MCH 24.4 (L) 26.0 - 34.0 pg   MCHC 32.8 30.0 - 36.0 g/dL   RDW 16.1 (H) 11.5 - 15.5 %   Platelets 416 (H) 150 - 400 K/uL   Neutrophils Relative % 69 %   Neutro Abs 4.4 1.7 - 7.7 K/uL   Lymphocytes Relative 21 %   Lymphs Abs 1.3 0.7 - 4.0 K/uL   Monocytes Relative 9 %   Monocytes Absolute 0.5 0.1 - 1.0 K/uL   Eosinophils Relative 1 %  Eosinophils Absolute 0.1  0.0 - 0.7 K/uL   Basophils Relative 0 %   Basophils Absolute 0.0 0.0 - 0.1 K/uL  Comprehensive metabolic panel     Status: Abnormal   Collection Time: 07/07/17  2:50 PM  Result Value Ref Range   Sodium 127 (L) 135 - 145 mmol/L   Potassium 4.0 3.5 - 5.1 mmol/L   Chloride 86 (L) 101 - 111 mmol/L   CO2 24 22 - 32 mmol/L   Glucose, Bld 89 65 - 99 mg/dL   BUN 100 (H) 6 - 20 mg/dL   Creatinine, Ser 3.91 (H) 0.61 - 1.24 mg/dL   Calcium 8.9 8.9 - 10.3 mg/dL   Total Protein 6.5 6.5 - 8.1 g/dL   Albumin 3.0 (L) 3.5 - 5.0 g/dL   AST 188 (H) 15 - 41 U/L   ALT 108 (H) 17 - 63 U/L   Alkaline Phosphatase 67 38 - 126 U/L   Total Bilirubin 1.2 0.3 - 1.2 mg/dL   GFR calc non Af Amer 15 (L) >60 mL/min   GFR calc Af Amer 18 (L) >60 mL/min    Comment: (NOTE) The eGFR has been calculated using the CKD EPI equation. This calculation has not been validated in all clinical situations. eGFR's persistently <60 mL/min signify possible Chronic Kidney Disease.    Anion gap 17 (H) 5 - 15  Magnesium     Status: None   Collection Time: 07/07/17  2:50 PM  Result Value Ref Range   Magnesium 2.3 1.7 - 2.4 mg/dL  .Cooxemetry Panel (carboxy, met, total hgb, O2 sat)     Status: Abnormal   Collection Time: 07/07/17  6:30 PM  Result Value Ref Range   Total hemoglobin 10.6 (L) 12.0 - 16.0 g/dL   O2 Saturation 41.1 %   Carboxyhemoglobin 1.3 0.5 - 1.5 %   Methemoglobin 1.0 0.0 - 1.5 %  Basic metabolic panel     Status: Abnormal   Collection Time: 07/08/17  4:36 AM  Result Value Ref Range   Sodium 129 (L) 135 - 145 mmol/L   Potassium 3.5 3.5 - 5.1 mmol/L   Chloride 91 (L) 101 - 111 mmol/L   CO2 27 22 - 32 mmol/L   Glucose, Bld 103 (H) 65 - 99 mg/dL   BUN 86 (H) 6 - 20 mg/dL   Creatinine, Ser 3.35 (H) 0.61 - 1.24 mg/dL   Calcium 8.6 (L) 8.9 - 10.3 mg/dL   GFR calc non Af Amer 19 (L) >60 mL/min   GFR calc Af Amer 21 (L) >60 mL/min    Comment: (NOTE) The eGFR has been calculated using the CKD EPI  equation. This calculation has not been validated in all clinical situations. eGFR's persistently <60 mL/min signify possible Chronic Kidney Disease.    Anion gap 11 5 - 15  .Cooxemetry Panel (carboxy, met, total hgb, O2 sat)     Status: Abnormal   Collection Time: 07/08/17  4:45 AM  Result Value Ref Range   Total hemoglobin 9.2 (L) 12.0 - 16.0 g/dL   O2 Saturation 55.9 %   Carboxyhemoglobin 1.2 0.5 - 1.5 %   Methemoglobin 1.3 0.0 - 1.5 %  Urinalysis, Routine w reflex microscopic     Status: Abnormal   Collection Time: 07/08/17  6:31 AM  Result Value Ref Range   Color, Urine YELLOW YELLOW   APPearance CLEAR CLEAR   Specific Gravity, Urine 1.006 1.005 - 1.030   pH 7.0 5.0 - 8.0   Glucose, UA  NEGATIVE NEGATIVE mg/dL   Hgb urine dipstick LARGE (A) NEGATIVE   Bilirubin Urine NEGATIVE NEGATIVE   Ketones, ur NEGATIVE NEGATIVE mg/dL   Protein, ur 30 (A) NEGATIVE mg/dL   Nitrite NEGATIVE NEGATIVE   Leukocytes, UA LARGE (A) NEGATIVE   RBC / HPF TOO NUMEROUS TO COUNT 0 - 5 RBC/hpf   WBC, UA TOO NUMEROUS TO COUNT 0 - 5 WBC/hpf   Bacteria, UA FEW (A) NONE SEEN   Squamous Epithelial / LPF NONE SEEN NONE SEEN   WBC Clumps PRESENT    Mucus PRESENT    US Renal  Result Date: 07/08/2017 CLINICAL DATA:  61 year old male with a history of polycystic kidney disease and chronic renal disease EXAM: RENAL / URINARY TRACT ULTRASOUND COMPLETE COMPARISON:  Ultrasound 06/16/2017, 05/13/2017.  Chest CT 05/06/2006 FINDINGS: Right Kidney: Length: 16.3 cm. Right kidney demonstrates parenchymal replacement with innumerable cysts, compatible with the given history and findings on prior chest CT and ultrasound studies. The largest measured cysts are: 4.5 cm x 3.9 cm x 4.2 cm, and a second cyst measuring 3.2 cm x 2.5 cm x 2.8 cm. No hydronephrosis. Increased echogenicity of the intervening parenchyma. Left Kidney: Length: 15.9 cm. Left kidney demonstrates parenchymal replacement with numeral cysts, compatible with  given history and the findings on the prior studies. Largest cyst measured include: 3.3 cm x 3.2 cm x 3.0 cm, and 3.1 cm x 3.0 cm x 3.2 cm. The intervening parenchyma is increased echogenicity. No hydronephrosis. Bladder: Catheter within the urinary bladder. IMPRESSION: Ultrasound study again demonstrates changes of polycystic kidney disease, with replacement of the renal parenchyma by innumerable cysts, and increased echogenicity of the intervening renal parenchyma compatible with chronic changes. Electronically Signed   By: Corrie Mckusick D.O.   On: 07/08/2017 09:16   Dg Chest Port 1v Same Day  Result Date: 07/07/2017 CLINICAL DATA:  Status post Swan-Ganz catheter placement EXAM: PORTABLE CHEST 1 VIEW COMPARISON:  07/05/2017 FINDINGS: Lungs are clear.  No pleural effusion or pneumothorax. Status post right IJ Swan-Ganz catheter placement with its distal tip in the right lower lobe pulmonary artery. Cardiomegaly.  Left subclavian ICD. IMPRESSION: Status post right IJ Swan-Ganz catheter placement with its distal tip in the right lower lobe pulmonary artery. Consider withdrawal approximately 5 cm into the right main pulmonary artery, as clinically warranted. These results will be called to the ordering clinician or representative by the Radiologist Assistant, and communication documented in the PACS or zVision Dashboard. Electronically Signed   By: Julian Hy M.D.   On: 07/07/2017 18:11    ROS: unobtainable as sedated with precedex gtt  Blood pressure 118/85, pulse 80, temperature 99.1 F (37.3 C), resp. rate 19, height '5\' 9"'$  (1.753 m), weight 60.2 kg (132 lb 11.5 oz), SpO2 99 %. Physical Exam  GEN sleeping HEENT eyes closed NECK + JVD to angle of mandible PULM rhonchi bilaterally anteriorly CV RRR, + S3 ABD soft, very mildly distended EXT+ trace bilateral edema, warm NEURO sedated   Assessment/Plan  1.  Acute on chronic kidney injury: likely due to decompensated CHF in the setting of  known cardiorenal syndrome and cystic disease.  ? If he has ADPKD and it's of little clinical significance right now.  He is improving with dobutamine.  Don't think he needs dialysis at this point.  Will see what happens with advanced therapies (inotropes, LVAD or transplant eval).  Sending Urine culture given UA results.   2.  Cystic kidney disease: Urine culture as above.  He  doesn't have any real signs/ symptoms of infected cysts; however he does have some low-grade fevers which we need to keep an eye on.  Nursing staff notes some possible Foley trauma due to anxiety  3.  Anxiety.  On precedex gtt  4.  Hypervolemic hyponatremia: expect to improve with optimization of vol status  5.  Acute on chronic systolic CHF: Swanned, on dobutamine, Co-ox improved. Madelon Lips MD [facility] pgr [phone] 07/08/2017, 3:39 PM

## 2017-07-08 NOTE — Progress Notes (Signed)
Cards fellow paged regarding ongoing anxiety, inability to urinate without trying to get out of bed, and noncompliance with instructions regarding movement with current lines. Orders received for additional dose of xanax and foley catheter. Will continue to monitor closely. Modena Jansky RN 2 Heart

## 2017-07-08 NOTE — Telephone Encounter (Signed)
Patients appt was canceled due to him in the hospital in [place]. Pt. Will call back to reschedule.

## 2017-07-08 NOTE — Care Management Note (Signed)
Case Management Note  Patient Details  Name: Kelly Leonard MRN: [id number] Date of Birth: 21-Aug-1956  Subjective/Objective:  Transfer from [facility], from home, presents with Acute/Chronic HF, cardiogenic shock, CKD stage 3, HTN, anxiety, ICD.  Psych consulted due to anxiety.                    Action/Plan: NCM will follow for dc needs.   Expected Discharge Date:                  Expected Discharge Plan:     In-House Referral:     Discharge planning Services  CM Consult  Post Acute Care Choice:    Choice offered to:     DME Arranged:    DME Agency:     HH Arranged:    HH Agency:     Status of Service:  In process, will continue to follow  If discussed at Long Length of Stay Meetings, dates discussed:    Additional Comments:  Leone Haven, RN 07/08/2017, 3:38 PM

## 2017-07-09 ENCOUNTER — Encounter (HOSPITAL_COMMUNITY): Payer: Self-pay | Admitting: Emergency Medicine

## 2017-07-09 LAB — BASIC METABOLIC PANEL
ANION GAP: 12 (ref 5–15)
BUN: 70 mg/dL — ABNORMAL HIGH (ref 6–20)
CALCIUM: 8.7 mg/dL — AB (ref 8.9–10.3)
CO2: 27 mmol/L (ref 22–32)
Chloride: 100 mmol/L — ABNORMAL LOW (ref 101–111)
Creatinine, Ser: 2.98 mg/dL — ABNORMAL HIGH (ref 0.61–1.24)
GFR, EST AFRICAN AMERICAN: 25 mL/min — AB (ref >60)
GFR, EST NON AFRICAN AMERICAN: 21 mL/min — AB (ref >60)
Glucose, Bld: 101 mg/dL — ABNORMAL HIGH (ref 65–99)
POTASSIUM: 3.5 mmol/L (ref 3.5–5.1)
Sodium: 139 mmol/L (ref 135–145)

## 2017-07-09 LAB — URINE CULTURE
CULTURE: NO GROWTH
Special Requests: NORMAL

## 2017-07-09 LAB — COOXEMETRY PANEL
CARBOXYHEMOGLOBIN: 1.6 % — AB (ref 0.5–1.5)
Methemoglobin: 1.1 % (ref 0.0–1.5)
O2 SAT: 66.8 %
TOTAL HEMOGLOBIN: 11.7 g/dL — AB (ref 12.0–16.0)

## 2017-07-09 LAB — HIV ANTIBODY (ROUTINE TESTING W REFLEX): HIV Screen 4th Generation wRfx: NONREACTIVE

## 2017-07-09 LAB — HEPATITIS PANEL, ACUTE
HEP A IGM: NEGATIVE
HEP B S AG: NEGATIVE
Hep B C IgM: NEGATIVE

## 2017-07-09 LAB — URIC ACID: Uric Acid, Serum: 15.5 mg/dL — ABNORMAL HIGH (ref 4.4–7.6)

## 2017-07-09 MED ORDER — ISOSORB DINITRATE-HYDRALAZINE 20-37.5 MG PO TABS
1.0000 | ORAL_TABLET | Freq: Three times a day (TID) | ORAL | Status: DC
  Administered 2017-07-09 (×3): 1 via ORAL
  Filled 2017-07-09 (×4): qty 1

## 2017-07-09 MED ORDER — PREDNISONE 20 MG PO TABS
40.0000 mg | ORAL_TABLET | Freq: Every day | ORAL | Status: AC
  Administered 2017-07-09 – 2017-07-11 (×3): 40 mg via ORAL
  Filled 2017-07-09 (×3): qty 2

## 2017-07-09 MED ORDER — POTASSIUM CHLORIDE 10 MEQ/50ML IV SOLN
10.0000 meq | INTRAVENOUS | Status: AC
  Administered 2017-07-09 (×6): 10 meq via INTRAVENOUS
  Filled 2017-07-09 (×6): qty 50

## 2017-07-09 MED ORDER — COLCHICINE 0.6 MG PO TABS
0.6000 mg | ORAL_TABLET | Freq: Every day | ORAL | Status: DC
  Administered 2017-07-10 – 2017-07-15 (×6): 0.6 mg via ORAL
  Filled 2017-07-09 (×6): qty 1

## 2017-07-09 MED ORDER — COLCHICINE 0.6 MG PO TABS: 0.6000 mg | ORAL_TABLET | Freq: Every day | ORAL | Status: DC

## 2017-07-09 MED ORDER — COLCHICINE 0.6 MG PO TABS
1.2000 mg | ORAL_TABLET | Freq: Once | ORAL | Status: AC
  Administered 2017-07-09: 1.2 mg via ORAL
  Filled 2017-07-09: qty 2

## 2017-07-09 NOTE — Progress Notes (Signed)
Pt called RN to bedside. Pt c/o if bilateral wrist pain that "feels like I broke my wrist" patient denies any recent trauma. Right wrist more painful than the left. Assessment found wrist warm to touch, skin red, wrist joints painful to any movement with no visual trauma seen. Tonye Becket NP paged. Verbal orders given to draw uric acid. Warm compress applied to right wrist. Will continue to monitor and assess patient closely.

## 2017-07-09 NOTE — Progress Notes (Signed)
[facility] Progress Note    Assessment/ Plan:   1.  Acute on chronic kidney injury: likely due to decompensated CHF in the setting of known cardiorenal syndrome and cystic disease.  ? If he has ADPKD and it's of little clinical significance right now.  He is improving with dobutamine.  Don't think he needs dialysis at this point.  Will see what happens with advanced therapies (inotropes, LVAD or transplant eval).  Sending Urine culture given UA results.  Of note, his HCV antibody is + and although this can cause MPGN, I have less suspicion for this given overall creatinine trajectory here and elsewhere (in combination with known cystic kidney disease as the result of his CKD along with cardiorenal and hypertensive nephrosclerosis).  Moreover, biopsy is not appropriate in this pt.     2.  Cystic kidney disease: Urine culture as above.  He doesn't have any real signs/ symptoms of infected cysts; however he does have some low-grade fevers which we need to keep an eye on.  Nursing staff notes some possible Foley trauma due to anxiety  3.  Anxiety.  On precedex gtt  4.  Hypervolemic hyponatremia: expect to improve with optimization of vol status  5.  Acute on chronic systolic CHF: Swanned, on dobutamine, Co-ox improved.  6. HCV + antibody: PCR ordered.  Subjective:    UOP and Cr continue to improve.  Reports anxiety better   Objective:   BP 120/80   Pulse 70   Temp (!) 97.2 F (36.2 C)   Resp 14   Ht 5\' 9"  (1.753 m)   Wt 55.5 kg (122 lb 5.7 oz)   SpO2 100%   BMI 18.07 kg/m   Intake/Output Summary (Last 24 hours) at 07/09/17 1233 Last data filed at 07/09/17 1123  Gross per 24 hour  Intake           898.56 ml  Output             5900 ml  Net         -5001.44 ml   Weight change: -7.7 kg (-16 lb 15.6 oz)  Physical Exam: GEN sleeping HEENT eyes closed NECK JVD improved PULM clear bilaterally CV RRR, no S3 ABD soft, nondistended EXT no edema, warm NEURO  sedated   Imaging: US Renal  Result Date: 07/08/2017 CLINICAL DATA:  61 year old male with a history of polycystic kidney disease and chronic renal disease EXAM: RENAL / URINARY TRACT ULTRASOUND COMPLETE COMPARISON:  Ultrasound 06/16/2017, 05/13/2017.  Chest CT 05/06/2006 FINDINGS: Right Kidney: Length: 16.3 cm. Right kidney demonstrates parenchymal replacement with innumerable cysts, compatible with the given history and findings on prior chest CT and ultrasound studies. The largest measured cysts are: 4.5 cm x 3.9 cm x 4.2 cm, and a second cyst measuring 3.2 cm x 2.5 cm x 2.8 cm. No hydronephrosis. Increased echogenicity of the intervening parenchyma. Left Kidney: Length: 15.9 cm. Left kidney demonstrates parenchymal replacement with numeral cysts, compatible with given history and the findings on the prior studies. Largest cyst measured include: 3.3 cm x 3.2 cm x 3.0 cm, and 3.1 cm x 3.0 cm x 3.2 cm. The intervening parenchyma is increased echogenicity. No hydronephrosis. Bladder: Catheter within the urinary bladder. IMPRESSION: Ultrasound study again demonstrates changes of polycystic kidney disease, with replacement of the renal parenchyma by innumerable cysts, and increased echogenicity of the intervening renal parenchyma compatible with chronic changes. Electronically Signed   By: Gilmer Mor D.O.   On: 07/08/2017 09:16  Dg Chest Port 1v Same Day  Result Date: 07/07/2017 CLINICAL DATA:  Status post Swan-Ganz catheter placement EXAM: PORTABLE CHEST 1 VIEW COMPARISON:  07/05/2017 FINDINGS: Lungs are clear.  No pleural effusion or pneumothorax. Status post right IJ Swan-Ganz catheter placement with its distal tip in the right lower lobe pulmonary artery. Cardiomegaly.  Left subclavian ICD. IMPRESSION: Status post right IJ Swan-Ganz catheter placement with its distal tip in the right lower lobe pulmonary artery. Consider withdrawal approximately 5 cm into the right main pulmonary artery, as  clinically warranted. These results will be called to the ordering clinician or representative by the Radiologist Assistant, and communication documented in the PACS or zVision Dashboard. Electronically Signed   By: Charline BillsSriyesh  Krishnan M.D.   On: 07/07/2017 18:11    Labs: BMET  Recent Labs Lab 07/05/17 1207 07/06/17 0433  07/06/17 2046 07/07/17 0322 07/07/17 0832 07/07/17 1450 07/08/17 0436 07/08/17 1755 07/09/17 0354  NA 118* 122*  < > 124* 123* 123* 127* 129* 135 139  K 5.0 5.5*  --   --  5.3*  --  4.0 3.5 3.7 3.5  CL 78* 81*  --   --  85*  --  86* 91* 97* 100*  CO2 22 22  --   --  24  --  24 27 28 27   GLUCOSE 123* 100*  --   --  101*  --  89 103* 110* 101*  BUN 92* 95*  --   --  100*  --  100* 86* 77* 70*  CREATININE 3.79* 3.96*  --   --  4.14*  --  3.91* 3.35* 3.25* 2.98*  CALCIUM 8.9 9.3  --   --  8.9  --  8.9 8.6* 8.5* 8.7*  < > = values in this interval not displayed. CBC  Recent Labs Lab 07/04/17 1932 07/05/17 1207 07/06/17 0433 07/07/17 1450  WBC 7.6 8.8 8.3 6.4  NEUTROABS 5.7  --   --  4.4  HGB 12.8* 12.3* 11.9* 11.3*  HCT 38.7* 37.8* 37.3* 34.5*  MCV 77.6* 76.5* 77.7* 74.4*  PLT 358 425 418 416*    Medications:    . ALPRAZolam  0.5 mg Oral TID  . busPIRone  5 mg Oral TID  . Chlorhexidine Gluconate Cloth  6 each Topical Daily  . [START ON 07/10/2017] colchicine  0.6 mg Oral Daily  . colchicine  1.2 mg Oral Once  . gabapentin  300 mg Oral TID  . isosorbide-hydrALAZINE  1 tablet Oral TID  . predniSONE  40 mg Oral Daily  . sodium chloride flush  3 mL Intravenous Q12H      Bufford ButtnerElizabeth Chayden Garrelts, MD [facility] pgr [phone] 07/09/2017, 12:33 PM

## 2017-07-09 NOTE — Progress Notes (Signed)
Advanced Heart Failure Rounding Note   Subjective:   Yesterday diuresed with IV lasix. Brisk diuresis noted. WEight down 10 pounds. Psychiatry consulted.    Denies SOB.   CVP 2 PA 58/25 PCWP 23 CO 2.8/ CI 1.59  SVR 3016  CO-OX 67%.   Echo 07/07/17 EF 20-25% mod MR    Objective:   Weight Range:  Vital Signs:   Temp:  [97.8 F (36.6 C)-100.4 F (38 C)] 98.1 F (36.7 C) (10/31 0600) Pulse Rate:  [75-123] 83 (10/31 0600) Resp:  [9-23] 14 (10/31 0600) BP: (115-190)/(83-154) 138/96 (10/31 0600) SpO2:  [96 %-100 %] 100 % (10/31 0600) Weight:  [122 lb 5.7 oz (55.5 kg)] 122 lb 5.7 oz (55.5 kg) (10/31 0500) Last BM Date: 07/07/17  Weight change: Filed Weights   07/07/17 1300 07/08/17 0437 07/09/17 0500  Weight: 139 lb 5.3 oz (63.2 kg) 132 lb 11.5 oz (60.2 kg) 122 lb 5.7 oz (55.5 kg)    Intake/Output:   Intake/Output Summary (Last 24 hours) at 07/09/17 0742 Last data filed at 07/09/17 0600  Gross per 24 hour  Intake           759.67 ml  Output             5525 ml  Net         -4765.33 ml     Physical Exam: CVP 2 General:  Appears drowsy. No resp difficulty HEENT: normal Neck: supple. JVP flat . Carotids 2+ bilat; no bruits. No lymphadenopathy or thryomegaly appreciated. RIJ swan Cor: PMI nondisplaced. Regular rate & rhythm. No rubs, gallops or murmurs.  Lungs: clear Abdomen: soft, nontender, nondistended. No hepatosplenomegaly. No bruits or masses. Good bowel sounds. Extremities: no cyanosis, clubbing, rash, edema Neuro: drowsy, alert & orientedx3, cranial nerves grossly intact. moves all 4 extremities w/o difficulty. Affect flat  Telemetry: NSR 80s Personally reviewed.    Labs: Basic Metabolic Panel:  Recent Labs Lab 07/07/17 0322 07/07/17 0832 07/07/17 1450 07/08/17 0436 07/08/17 1755 07/09/17 0354  NA 123* 123* 127* 129* 135 139  K 5.3*  --  4.0 3.5 3.7 3.5  CL 85*  --  86* 91* 97* 100*  CO2 24  --  24 27 28 27   GLUCOSE 101*  --  89 103*  110* 101*  BUN 100*  --  100* 86* 77* 70*  CREATININE 4.14*  --  3.91* 3.35* 3.25* 2.98*  CALCIUM 8.9  --  8.9 8.6* 8.5* 8.7*  MG  --   --  2.3  --   --   --     Liver Function Tests:  Recent Labs Lab 07/06/17 0433 07/07/17 1450  AST 146* 188*  ALT 79* 108*  ALKPHOS 60 67  BILITOT 1.6* 1.2  PROT 7.3 6.5  ALBUMIN 3.4* 3.0*   No results for input(s): LIPASE, AMYLASE in the last 168 hours. No results for input(s): AMMONIA in the last 168 hours.  CBC:  Recent Labs Lab 07/04/17 1932 07/05/17 1207 07/06/17 0433 07/07/17 1450  WBC 7.6 8.8 8.3 6.4  NEUTROABS 5.7  --   --  4.4  HGB 12.8* 12.3* 11.9* 11.3*  HCT 38.7* 37.8* 37.3* 34.5*  MCV 77.6* 76.5* 77.7* 74.4*  PLT 358 425 418 416*    Cardiac Enzymes:  Recent Labs Lab 07/04/17 1932 07/05/17 1207  TROPONINI 0.05* 0.06*    BNP: BNP (last 3 results)  Recent Labs  05/29/17 2152 06/05/17 1537 07/04/17 1932  BNP 3,608.0* 4,465.0* 3,665.0*  ProBNP (last 3 results) No results for input(s): PROBNP in the last 8760 hours.    Other results:  Imaging: [place] Renal  Result Date: 07/08/2017 CLINICAL DATA:  61 year old male with a history of polycystic kidney disease and chronic renal disease EXAM: RENAL / URINARY TRACT ULTRASOUND COMPLETE COMPARISON:  Ultrasound 06/16/2017, 05/13/2017.  Chest CT 05/06/2006 FINDINGS: Right Kidney: Length: 16.3 cm. Right kidney demonstrates parenchymal replacement with innumerable cysts, compatible with the given history and findings on prior chest CT and ultrasound studies. The largest measured cysts are: 4.5 cm x 3.9 cm x 4.2 cm, and a second cyst measuring 3.2 cm x 2.5 cm x 2.8 cm. No hydronephrosis. Increased echogenicity of the intervening parenchyma. Left Kidney: Length: 15.9 cm. Left kidney demonstrates parenchymal replacement with numeral cysts, compatible with given history and the findings on the prior studies. Largest cyst measured include: 3.3 cm x 3.2 cm x 3.0 cm, and 3.1 cm  x 3.0 cm x 3.2 cm. The intervening parenchyma is increased echogenicity. No hydronephrosis. Bladder: Catheter within the urinary bladder. IMPRESSION: Ultrasound study again demonstrates changes of polycystic kidney disease, with replacement of the renal parenchyma by innumerable cysts, and increased echogenicity of the intervening renal parenchyma compatible with chronic changes. Electronically Signed   By: Gilmer MorJaime  Wagner D.O.   On: 07/08/2017 09:16   Dg Chest Port 1v Same Day  Result Date: 07/07/2017 CLINICAL DATA:  Status post Swan-Ganz catheter placement EXAM: PORTABLE CHEST 1 VIEW COMPARISON:  07/05/2017 FINDINGS: Lungs are clear.  No pleural effusion or pneumothorax. Status post right IJ Swan-Ganz catheter placement with its distal tip in the right lower lobe pulmonary artery. Cardiomegaly.  Left subclavian ICD. IMPRESSION: Status post right IJ Swan-Ganz catheter placement with its distal tip in the right lower lobe pulmonary artery. Consider withdrawal approximately 5 cm into the right main pulmonary artery, as clinically warranted. These results will be called to the ordering clinician or representative by the Radiologist Assistant, and communication documented in the PACS or zVision Dashboard. Electronically Signed   By: Charline BillsSriyesh  Krishnan M.D.   On: 07/07/2017 18:11     Medications:     Scheduled Medications: . ALPRAZolam  0.5 mg Oral TID  . busPIRone  5 mg Oral TID  . Chlorhexidine Gluconate Cloth  6 each Topical Daily  . enoxaparin (LOVENOX) injection  30 mg Subcutaneous Q24H  . furosemide  80 mg Intravenous BID  . gabapentin  300 mg Oral TID  . sodium chloride flush  3 mL Intravenous Q12H    Infusions: . sodium chloride    . dexmedetomidine (PRECEDEX) IV infusion 0.4 mcg/kg/hr (07/09/17 0602)  . DOBUTamine 2.5 mcg/kg/min (07/09/17 0600)  . levofloxacin (LEVAQUIN) IV    . potassium chloride 10 mEq (07/09/17 [id number])    PRN Medications: sodium chloride, acetaminophen, ondansetron  (ZOFRAN) IV, sodium chloride flush   Assessment:   Mr Karl ItoMcCollum is a 61 year old with a history of NICM, CKD Stage III, HTN, anxiety, ICD, and chronic systolic heart failure.  Admitted with A/C systolic heart failure,    Plan/Discussion:     1. A/C Systolic Heart Failure  -> cardiogenic shock -NYHA IIIb. NICM. ECHO EF 05/2017 EF 20-25%.  - Now on dobutamine 2.5 mcg. CO-OX 67%.  - CVP low. Stop IV lasix.  -Add 1 tab bidil tid for afterload reduction.  - no Spiro with CKD.  - No bb with low output.  - Likely not canddidate for advanced therapies due to severe CKD and social situation/severe anxiety/noncomplaince -  Will continue dobutamine for several days to see if we can improve renal function much. Baseline creatinine has been ~2.5-3.0 2. Hyponatremia - Stable 139. Asymptomatic 3. AKI on CKD Stage IV - Creatinine baseline 2.8-3.0. - Creatinine 2.98 today  - Likely component of cardiorenal syndrome - Will see how good renal function will get with intoropc support -Renal US completed- Polycystic  kidney disease.  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 Check HCV Nucleic Acid Amplification HIV pending.  4.  Anxiety  - severe. - Psychiatry consult appreciated.  5. UTI - UA with large amount of leukocytes. Urine culture pending. On levaquin.   Discussed with Dr Signe Colt at bedside. May need extended antibiotic course 3-4 weeks.   Length of Stay: 2   Amy Clegg 07/09/2017, 7:42 AM  Advanced Heart Failure Team Pager [phone] (M-F; 7a - 4p)  Please contact [facility] for night-coverage after hours (4p -7a ) and weekends on [email or web address]  Agree with above   Remains on dobutamine and IV lasix. Diuresing very well. Co-ox ok. CVP 2-3  Creatinine coming down slowly. Renal u/s with severe PCKD. HCV Ab +. Remains sedated on precedex. Appreciate Psych input.   Exam  Sleepy but arouses and answers questions HEENT normal Neck RIJ swan CVP flat Cor RRR +s3 Lungs clear Ab soft NT/ND Ext:  warm no edema  Neuro sedated but fidgety   Cardiogenic shock improved with dobutamine. Volume status looks good now. Will stop IV lasix. Continue dobutamine to see how much we can get renal function to improve. Start Bidil. Will wean Precedex off as tolerated Hopefully can pull swan tomorrow. Given PCKD and severe anxiety doubt he will be candidate for advanced therapies in the long run. We will see. Check HCV RNA.   CRITICAL CARE Performed by: Arvilla Meres  Total critical care time: 35 minutes  Critical care time was exclusive of separately billable procedures and treating other patients.  Critical care was necessary to treat or prevent imminent or life-threatening deterioration.  Critical care was time spent personally by me (independent of midlevel providers or residents) on the following activities: development of treatment plan with patient and/or surrogate as well as nursing, discussions with consultants, evaluation of patient's response to treatment, examination of patient, obtaining history from patient or surrogate, ordering and performing treatments and interventions, ordering and review of laboratory studies, ordering and review of radiographic studies, pulse oximetry and re-evaluation of patient's condition.  Arvilla Meres, MD  5:47 PM

## 2017-07-09 NOTE — Progress Notes (Signed)
   Received call from nursing staff regarding wrist pain.   I ordered uric acid. Uric Acid 15. 5  Prednisone ordered 40 mg x 3 day   Also given 1.2 colchicine x1  Tomorrow start 0.6 colchicine.   Taveon Enyeart NP-C  2:09 PM

## 2017-07-10 ENCOUNTER — Encounter (HOSPITAL_COMMUNITY): Payer: Self-pay | Admitting: *Deleted

## 2017-07-10 LAB — BASIC METABOLIC PANEL
ANION GAP: 11 (ref 5–15)
BUN: 59 mg/dL — AB (ref 6–20)
CHLORIDE: 96 mmol/L — AB (ref 101–111)
CO2: 26 mmol/L (ref 22–32)
Calcium: 8.2 mg/dL — ABNORMAL LOW (ref 8.9–10.3)
Creatinine, Ser: 2.55 mg/dL — ABNORMAL HIGH (ref 0.61–1.24)
GFR calc Af Amer: 30 mL/min — ABNORMAL LOW (ref >60)
GFR, EST NON AFRICAN AMERICAN: 26 mL/min — AB (ref >60)
Glucose, Bld: 238 mg/dL — ABNORMAL HIGH (ref 65–99)
POTASSIUM: 3.5 mmol/L (ref 3.5–5.1)
SODIUM: 133 mmol/L — AB (ref 135–145)

## 2017-07-10 LAB — CBC
HCT: 35.8 % — ABNORMAL LOW (ref 39.0–52.0)
Hemoglobin: 11.4 g/dL — ABNORMAL LOW (ref 13.0–17.0)
MCH: 24.4 pg — ABNORMAL LOW (ref 26.0–34.0)
MCHC: 31.8 g/dL (ref 30.0–36.0)
MCV: 76.5 fL — ABNORMAL LOW (ref 78.0–100.0)
Platelets: 290 10*3/uL (ref 150–400)
RBC: 4.68 MIL/uL (ref 4.22–5.81)
RDW: 16.4 % — ABNORMAL HIGH (ref 11.5–15.5)
WBC: 10.3 10*3/uL (ref 4.0–10.5)

## 2017-07-10 LAB — COOXEMETRY PANEL
CARBOXYHEMOGLOBIN: 1.8 % — AB (ref 0.5–1.5)
METHEMOGLOBIN: 1.1 % (ref 0.0–1.5)
O2 SAT: 76.9 %
TOTAL HEMOGLOBIN: 11.3 g/dL — AB (ref 12.0–16.0)

## 2017-07-10 LAB — HCV RNA QUANT
HCV Quantitative Log: 6.111 log10 IU/mL (ref >1.70)
HCV Quantitative: 1290000 IU/mL (ref >50)

## 2017-07-10 MED ORDER — SODIUM CHLORIDE 0.9 % IV SOLN: INTRAVENOUS | Status: DC

## 2017-07-10 MED ORDER — POTASSIUM CHLORIDE CRYS ER 20 MEQ PO TBCR
40.0000 meq | EXTENDED_RELEASE_TABLET | Freq: Every day | ORAL | Status: DC
  Administered 2017-07-10 – 2017-07-13 (×4): 40 meq via ORAL
  Filled 2017-07-10 (×4): qty 2

## 2017-07-10 MED ORDER — ISOSORB DINITRATE-HYDRALAZINE 20-37.5 MG PO TABS
2.0000 | ORAL_TABLET | Freq: Three times a day (TID) | ORAL | Status: DC
  Administered 2017-07-10 – 2017-07-15 (×16): 2 via ORAL
  Filled 2017-07-10 (×17): qty 2

## 2017-07-10 MED ORDER — MAGNESIUM HYDROXIDE 400 MG/5ML PO SUSP
30.0000 mL | Freq: Every day | ORAL | Status: DC | PRN
  Administered 2017-07-10 – 2017-07-13 (×2): 30 mL via ORAL
  Filled 2017-07-10 (×2): qty 30

## 2017-07-10 MED ORDER — FUROSEMIDE 80 MG PO TABS
80.0000 mg | ORAL_TABLET | Freq: Every day | ORAL | Status: DC
  Administered 2017-07-10 – 2017-07-12 (×3): 80 mg via ORAL
  Filled 2017-07-10 (×3): qty 1

## 2017-07-10 NOTE — Progress Notes (Addendum)
Advanced Heart Failure Rounding Note   Subjective:    Yesterday started prednisone and colchicine for acute gout. Uric Acid 15.5.  Remains on dobutamine 2.5 mcg.   Feeling better. Says anxiety medication working. Denies SOB/orthopnea.  Wrist pain improving.   Creatinine 2.98 -> 2.55  CVP 4  PA 44/12  CO 7.5 CI 4.21  CO-OX 77%.   Echo 07/07/17 EF 20-25% mod MR    Objective:   Weight Range:  Vital Signs:   Temp:  [97 F (36.1 C)-98.4 F (36.9 C)] 97.3 F (36.3 C) (11/01 0400) Pulse Rate:  [70-97] 97 (11/01 0400) Resp:  [11-23] 15 (11/01 0400) BP: (120-146)/(67-99) 135/88 (11/01 0300) SpO2:  [97 %-100 %] 100 % (11/01 0400) Weight:  [134 lb 11.2 oz (61.1 kg)] 134 lb 11.2 oz (61.1 kg) (11/01 0700) Last BM Date: 07/07/17  Weight change: Filed Weights   07/08/17 0437 07/09/17 0500 07/10/17 0700  Weight: 132 lb 11.5 oz (60.2 kg) 122 lb 5.7 oz (55.5 kg) 134 lb 11.2 oz (61.1 kg)    Intake/Output:   Intake/Output Summary (Last 24 hours) at 07/10/17 0741 Last data filed at 07/10/17 0400  Gross per 24 hour  Intake          1494.85 ml  Output             3050 ml  Net         -1555.15 ml     Physical Exam: CVP 4.  General:  Lying in bed . No resp difficulty HEENT: normal Neck: supple. no JVD. Carotids 2+ bilat; no bruits. No lymphadenopathy or thryomegaly appreciated. RIJ swan Cor: PMI laterally displaced. Regular rate & rhythm. No rubs, or murmurs. + S3  Lungs: clear Abdomen: soft, nontender, nondistended. No hepatosplenomegaly. No bruits or masses. Good bowel sounds. Extremities: no cyanosis, clubbing, rash, edema Neuro: alert & orientedx3, cranial nerves grossly intact. moves all 4 extremities w/o difficulty. Affect pleasant   Telemetry: NSR 90-100s personally reviewed.    Labs: Basic Metabolic Panel:  Recent Labs Lab 07/07/17 1450 07/08/17 0436 07/08/17 1755 07/09/17 0354 07/10/17 0447  NA 127* 129* 135 139 133*  K 4.0 3.5 3.7 3.5 3.5  CL 86*  91* 97* 100* 96*  CO2 24 27 28 27 26   GLUCOSE 89 103* 110* 101* 238*  BUN 100* 86* 77* 70* 59*  CREATININE 3.91* 3.35* 3.25* 2.98* 2.55*  CALCIUM 8.9 8.6* 8.5* 8.7* 8.2*  MG 2.3  --   --   --   --     Liver Function Tests:  Recent Labs Lab 07/06/17 0433 07/07/17 1450  AST 146* 188*  ALT 79* 108*  ALKPHOS 60 67  BILITOT 1.6* 1.2  PROT 7.3 6.5  ALBUMIN 3.4* 3.0*   No results for input(s): LIPASE, AMYLASE in the last 168 hours. No results for input(s): AMMONIA in the last 168 hours.  CBC:  Recent Labs Lab 07/04/17 1932 07/05/17 1207 07/06/17 0433 07/07/17 1450 07/10/17 0447  WBC 7.6 8.8 8.3 6.4 10.3  NEUTROABS 5.7  --   --  4.4  --   HGB 12.8* 12.3* 11.9* 11.3* 11.4*  HCT 38.7* 37.8* 37.3* 34.5* 35.8*  MCV 77.6* 76.5* 77.7* 74.4* 76.5*  PLT 358 425 418 416* 290    Cardiac Enzymes:  Recent Labs Lab 07/04/17 1932 07/05/17 1207  TROPONINI 0.05* 0.06*    BNP: BNP (last 3 results)  Recent Labs  05/29/17 2152 06/05/17 1537 07/04/17 1932  BNP 3,608.0* 4,465.0* 3,665.0*  ProBNP (last 3 results) No results for input(s): PROBNP in the last 8760 hours.    Other results:  Imaging: [place] Renal  Result Date: 07/08/2017 CLINICAL DATA:  61 year old male with a history of polycystic kidney disease and chronic renal disease EXAM: RENAL / URINARY TRACT ULTRASOUND COMPLETE COMPARISON:  Ultrasound 06/16/2017, 05/13/2017.  Chest CT 05/06/2006 FINDINGS: Right Kidney: Length: 16.3 cm. Right kidney demonstrates parenchymal replacement with innumerable cysts, compatible with the given history and findings on prior chest CT and ultrasound studies. The largest measured cysts are: 4.5 cm x 3.9 cm x 4.2 cm, and a second cyst measuring 3.2 cm x 2.5 cm x 2.8 cm. No hydronephrosis. Increased echogenicity of the intervening parenchyma. Left Kidney: Length: 15.9 cm. Left kidney demonstrates parenchymal replacement with numeral cysts, compatible with given history and the findings on  the prior studies. Largest cyst measured include: 3.3 cm x 3.2 cm x 3.0 cm, and 3.1 cm x 3.0 cm x 3.2 cm. The intervening parenchyma is increased echogenicity. No hydronephrosis. Bladder: Catheter within the urinary bladder. IMPRESSION: Ultrasound study again demonstrates changes of polycystic kidney disease, with replacement of the renal parenchyma by innumerable cysts, and increased echogenicity of the intervening renal parenchyma compatible with chronic changes. Electronically Signed   By: Kelly MorJaime  Leonard D.O.   On: 07/08/2017 09:16     Medications:     Scheduled Medications: . ALPRAZolam  0.5 mg Oral TID  . busPIRone  5 mg Oral TID  . Chlorhexidine Gluconate Cloth  6 each Topical Daily  . colchicine  0.6 mg Oral Daily  . gabapentin  300 mg Oral TID  . isosorbide-hydrALAZINE  1 tablet Oral TID  . predniSONE  40 mg Oral Daily  . sodium chloride flush  3 mL Intravenous Q12H    Infusions: . sodium chloride    . dexmedetomidine (PRECEDEX) IV infusion Stopped (07/09/17 0830)  . DOBUTamine 2.5 mcg/kg/min (07/09/17 0800)  . levofloxacin (LEVAQUIN) IV Stopped (07/09/17 1748)    PRN Medications: sodium chloride, acetaminophen, ondansetron (ZOFRAN) IV, sodium chloride flush   Assessment:   [name] is a 61 year old with a history of NICM, CKD Stage III, HTN, anxiety, ICD, and chronic systolic heart failure.  Admitted with A/C systolic heart failure,    Plan/Discussion:     1. A/C Systolic Heart Failure  -> cardiogenic shock -NYHA IIIb. NICM. ECHO EF 05/2017 EF 20-25%.  -remains on dobutamine 2.5 mcg. CO-OX 77%. .  - Increase bidil 2 tabs tid.   - no Spiro with CKD.  - No bb with low output.  - Likely not canddidate for advanced therapies due to severe CKD and social situation/severe anxiety/noncomplaince - Will continue dobutamine for several days to see if we can improve renal function much. Baseline creatinine has been ~2.5-3.0 - Start po lasix 80 daily 2.  Hyponatremia - Sodium 133. Asymptomatic 3. AKI on CKD Stage IV - Creatinine baseline 2.8-3.0. - Creatinine trending down 2.98> 2.55   - Likely component of cardiorenal syndrome - Will see how good renal function will get with intoropc support -Renal US completed- Polycystic  kidney disease.  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 HCV Nucleic Acid Amplification pending.  HIV--> NR  4.  Anxiety  - severe. - Psychiatry consult appreciated.  5. UTI - UA with large amount of leukocytes. Urine culture- no growth. On levaquin. D/C foley.  6. Acute Gout - Uric Acid 15.5 ay 2/3 Prednisone. Received 1.2 colchicine x1.   Length of Stay: 3  Amy Clegg NP-C  07/10/2017, 7:41 AM  Advanced Heart Failure Team Pager [phone] (M-F; 7a - 4p)  Please contact [facility] for night-coverage after hours (4p -7a ) and weekends on [email or web address]  Agree. Remains tenuous but now out of shock with dobutamine. Creatinine and co-ox improving. Ernestine Conrad number reviewed personally. Gout improving with prednisone. Anxiety improved  On exam Weak appearing.  RIJ swan Tachy regular + s3 Lungs clear  Ab soft NT/ND Ext warm no edema  Remains very tenuous. Cardiogenic shock improved with dobutamine. Start oral lasix. Increase Bidil. Anxiety improved. Gout improved.  Given PCKD and severe anxiety doubt he will be candidate for advanced therapies in the long run. We will see how good his kidneys get. If creatinine gets < 2.0 will need to consider formal eval. Can pull swan (leave introducer). D/w Nephrology.   CRITICAL CARE Performed by: Arvilla Meres  Total critical care time: 35 minutes  Critical care time was exclusive of separately billable procedures and treating other patients.  Critical care was necessary to treat or prevent imminent or life-threatening deterioration.  Critical care was time spent personally by me (independent of midlevel providers or residents) on the following activities: development of treatment  plan with patient and/or surrogate as well as nursing, discussions with consultants, evaluation of patient's response to treatment, examination of patient, obtaining history from patient or surrogate, ordering and performing treatments and interventions, ordering and review of laboratory studies, ordering and review of radiographic studies, pulse oximetry and re-evaluation of patient's condition.  Arvilla Meres, MD  8:31 AM

## 2017-07-10 NOTE — Progress Notes (Signed)
[facility] Progress Note    Assessment/ Plan:   1.  Acute on chronic kidney injury: likely due to decompensated CHF in the setting of known cardiorenal syndrome and cystic disease.  ? If he has ADPKD and it's of little clinical significance right now.  He is improving with dobutamine.  Don't think he needs dialysis at this point.  Will see what happens with advanced therapies (inotropes, LVAD or transplant eval).  Urine culture negative. Of note, his HCV antibody is + and although this can cause MPGN, I have less suspicion for this given overall creatinine trajectory here and elsewhere (in combination with known cystic kidney disease as the result of his CKD along with cardiorenal and hypertensive nephrosclerosis).  Moreover, biopsy is not appropriate in this pt.     Given continued improvement, will s/o.  Please don't hesistate to contact with any questions.  2.  Cystic kidney disease: Urine culture as above.  He doesn't have any real signs/ symptoms of infected cysts; however he does have some low-grade fevers which we need to keep an eye on.  Nursing staff notes some possible Foley trauma due to anxiety  3.  Anxiety.  psych c/s, of precedex gtt, per primary  4.  Hypervolemic hyponatremia: expect to improve with optimization of vol status  5.  Acute on chronic systolic CHF: Swanned, on dobutamine, Co-ox improved.  6. HCV + antibody: PCR ordered.  Subjective:    Continued improvement.   Objective:   BP (!) 160/93   Pulse 95   Temp 97.6 F (36.4 C) (Oral)   Resp 14   Ht 5\' 9"  (1.753 m)   Wt 61.1 kg (134 lb 11.2 oz)   SpO2 100%   BMI 19.89 kg/m   Intake/Output Summary (Last 24 hours) at 07/10/17 1219 Last data filed at 07/10/17 1100  Gross per 24 hour  Intake          1153.37 ml  Output             1985 ml  Net          -831.63 ml   Weight change: 5.6 kg (12 lb 5.5 oz)  Physical Exam: GEN awake, alert, NAD HEENT PERRL NECK JVD improved PULM clear  bilaterally CV RRR, no S3 ABD soft, nondistended EXT no edema, warm NEURO AAO x 3   Imaging: No results found.  Labs: BMET  Recent Labs Lab 07/06/17 0433  07/07/17 0322 07/07/17 0832 07/07/17 1450 07/08/17 0436 07/08/17 1755 07/09/17 0354 07/10/17 0447  NA 122*  < > 123* 123* 127* 129* 135 139 133*  K 5.5*  --  5.3*  --  4.0 3.5 3.7 3.5 3.5  CL 81*  --  85*  --  86* 91* 97* 100* 96*  CO2 22  --  24  --  24 27 28 27 26   GLUCOSE 100*  --  101*  --  89 103* 110* 101* 238*  BUN 95*  --  100*  --  100* 86* 77* 70* 59*  CREATININE 3.96*  --  4.14*  --  3.91* 3.35* 3.25* 2.98* 2.55*  CALCIUM 9.3  --  8.9  --  8.9 8.6* 8.5* 8.7* 8.2*  < > = values in this interval not displayed. CBC  Recent Labs Lab 07/04/17 1932 07/05/17 1207 07/06/17 0433 07/07/17 1450 07/10/17 0447  WBC 7.6 8.8 8.3 6.4 10.3  NEUTROABS 5.7  --   --  4.4  --   HGB 12.8*  12.3* 11.9* 11.3* 11.4*  HCT 38.7* 37.8* 37.3* 34.5* 35.8*  MCV 77.6* 76.5* 77.7* 74.4* 76.5*  PLT 358 425 418 416* 290    Medications:    . ALPRAZolam  0.5 mg Oral TID  . busPIRone  5 mg Oral TID  . Chlorhexidine Gluconate Cloth  6 each Topical Daily  . colchicine  0.6 mg Oral Daily  . furosemide  80 mg Oral Daily  . gabapentin  300 mg Oral TID  . isosorbide-hydrALAZINE  2 tablet Oral TID  . potassium chloride  40 mEq Oral Daily  . predniSONE  40 mg Oral Daily  . sodium chloride flush  3 mL Intravenous Q12H      Bufford ButtnerElizabeth Jamyla Ard, MD [facility] pgr [phone] 07/10/2017, 12:19 PM

## 2017-07-11 ENCOUNTER — Inpatient Hospital Stay: Payer: BLUE CROSS/BLUE SHIELD | Admitting: Unknown Physician Specialty

## 2017-07-11 DIAGNOSIS — R570 Cardiogenic shock: Secondary | ICD-10-CM

## 2017-07-11 LAB — CBC
HCT: 35.8 % — ABNORMAL LOW (ref 39.0–52.0)
Hemoglobin: 11.4 g/dL — ABNORMAL LOW (ref 13.0–17.0)
MCH: 24.2 pg — AB (ref 26.0–34.0)
MCHC: 31.8 g/dL (ref 30.0–36.0)
MCV: 75.8 fL — ABNORMAL LOW (ref 78.0–100.0)
Platelets: 277 10*3/uL (ref 150–400)
RBC: 4.72 MIL/uL (ref 4.22–5.81)
RDW: 16.1 % — AB (ref 11.5–15.5)
WBC: 13.2 10*3/uL — AB (ref 4.0–10.5)

## 2017-07-11 LAB — BASIC METABOLIC PANEL
ANION GAP: 10 (ref 5–15)
BUN: 52 mg/dL — ABNORMAL HIGH (ref 6–20)
CALCIUM: 8.2 mg/dL — AB (ref 8.9–10.3)
CHLORIDE: 93 mmol/L — AB (ref 101–111)
CO2: 31 mmol/L (ref 22–32)
Creatinine, Ser: 2.48 mg/dL — ABNORMAL HIGH (ref 0.61–1.24)
GFR calc non Af Amer: 27 mL/min — ABNORMAL LOW (ref >60)
GFR, EST AFRICAN AMERICAN: 31 mL/min — AB (ref >60)
Glucose, Bld: 114 mg/dL — ABNORMAL HIGH (ref 65–99)
Potassium: 3.9 mmol/L (ref 3.5–5.1)
SODIUM: 134 mmol/L — AB (ref 135–145)

## 2017-07-11 LAB — COOXEMETRY PANEL
CARBOXYHEMOGLOBIN: 1.9 % — AB (ref 0.5–1.5)
Methemoglobin: 0.9 % (ref 0.0–1.5)
O2 SAT: 66.5 %
TOTAL HEMOGLOBIN: 11.4 g/dL — AB (ref 12.0–16.0)

## 2017-07-11 MED ORDER — LEVOFLOXACIN IN D5W 250 MG/50ML IV SOLN: 250.0000 mg | INTRAVENOUS | Status: DC

## 2017-07-11 MED ORDER — GABAPENTIN 300 MG PO CAPS
300.0000 mg | ORAL_CAPSULE | Freq: Two times a day (BID) | ORAL | Status: DC
  Administered 2017-07-11 – 2017-07-15 (×9): 300 mg via ORAL
  Filled 2017-07-11 (×9): qty 1

## 2017-07-11 MED ORDER — LEVOFLOXACIN 250 MG PO TABS
250.0000 mg | ORAL_TABLET | Freq: Every day | ORAL | Status: AC
  Administered 2017-07-11 – 2017-07-15 (×5): 250 mg via ORAL
  Filled 2017-07-11 (×5): qty 1

## 2017-07-11 MED ORDER — ALLOPURINOL 100 MG PO TABS
100.0000 mg | ORAL_TABLET | Freq: Every day | ORAL | Status: DC
  Administered 2017-07-11 – 2017-07-15 (×5): 100 mg via ORAL
  Filled 2017-07-11 (×5): qty 1

## 2017-07-11 MED ORDER — ALUM & MAG HYDROXIDE-SIMETH 200-200-20 MG/5ML PO SUSP
30.0000 mL | Freq: Four times a day (QID) | ORAL | Status: DC | PRN
  Administered 2017-07-11 – 2017-07-12 (×3): 30 mL via ORAL
  Filled 2017-07-11 (×3): qty 30

## 2017-07-11 NOTE — Progress Notes (Signed)
Patient has spent a large part of the night on his cell phone. He has been encouraged to try to sleep and turn his phone off. He states he can not sleep despite the medications he has received to help him rest. He has vomited x2 tonight, however, he does not complain of nausea. Zofran 4 mg was given after his first emesis with some relief. Patient denies pain or shortness of breath. Vital signs stable. Will continue to monitor.

## 2017-07-11 NOTE — Progress Notes (Signed)
Patient has had multiple episodes of emesis. He denies abd pain or nausea. He states that when he falls asleep he feels a burning sensation then he wakes up and vomits. Patient was given Maalox 30 mg with minimal relief. He has been given his 2nd dose of Zofran 4 mg IV. Vital signs without changes. Will monitor and notify rounding physician.

## 2017-07-11 NOTE — Evaluation (Signed)
Physical Therapy Evaluation Patient Details Name: Kelly PolioMichael Leonard MRN: [id number] DOB: 20-Apr-1956 Today's Date: 07/11/2017   History of Present Illness  Kelly Leonard is a 61 year old with a history of NICM, CKD Stage III, HTN, anxiety, ICD, and chronic systolic heart failure.  Admit with CHF.    Clinical Impression  Pt admitted with above diagnosis. Pt currently with functional limitations due to the deficits listed below (see PT Problem List). Pt was able to ambulate with min guard assist on unit without LOB with min challenges.  Cannot withstand mod challenges to balance.  May need a RW or cane for stability at d/c.  Did well overall. Will follow acutely.  Pt will benefit from skilled PT to increase their independence and safety with mobility to allow discharge to the venue listed below.      Follow Up Recommendations Home health PT;Supervision - Intermittent    Equipment Recommendations  Other (comment) (may need a cane or RW for stability)    Recommendations for Other Services       Precautions / Restrictions Precautions Precautions: Fall Restrictions Weight Bearing Restrictions: No      Mobility  Bed Mobility Overal bed mobility: Independent                Transfers Overall transfer level: Independent                  Ambulation/Gait Ambulation/Gait assistance: Min guard Ambulation Distance (Feet): 390 Feet Assistive device:  (held onto IV pole) Gait Pattern/deviations: Step-through pattern;Decreased stride length;Drifts right/left   Gait velocity interpretation: Below normal speed for age/gender General Gait Details: Overall, gait steady.  Pt appeared to want to hold onto IV pole but did not seem to need it for support.  Occasional misstep by pt but pt was able to self correct.  Could not withstand challenges to balance.  Should progress but may need a cane or RW for safety.   Stairs            Wheelchair Mobility    Modified Rankin (Stroke  Patients Only)       Balance Overall balance assessment: Needs assistance Sitting-balance support: No upper extremity supported;Feet supported Sitting balance-Leahy Scale: Good     Standing balance support: Single extremity supported;During functional activity Standing balance-Leahy Scale: Fair Standing balance comment: Can stand statically without device and with 1 UE support.                              Pertinent Vitals/Pain Pain Assessment: No/denies pain  VSS.    Home Living Family/patient expects to be discharged to:: Private residence Living Arrangements: Spouse/significant other Available Help at Discharge: Family;Available 24 hours/day (wife is on disability but can help per pt) Type of Home: House Home Access: Stairs to enter Entrance Stairs-Rails: Left Entrance Stairs-Number of Steps: 3 Home Layout: One level Home Equipment: None      Prior Function Level of Independence: Independent               Hand Dominance        Extremity/Trunk Assessment   Upper Extremity Assessment Upper Extremity Assessment: Defer to OT evaluation    Lower Extremity Assessment Lower Extremity Assessment: Generalized weakness    Cervical / Trunk Assessment Cervical / Trunk Assessment: Normal  Communication   Communication: No difficulties  Cognition Arousal/Alertness: Awake/alert Behavior During Therapy: Anxious Overall Cognitive Status: Within Functional Limits for tasks assessed  General Comments      Exercises General Exercises - Lower Extremity Ankle Circles/Pumps: AROM;Both;10 reps;Seated Long Arc Quad: AROM;Both;10 reps;Seated   Assessment/Plan    PT Assessment Patient needs continued PT services  PT Problem List Decreased activity tolerance;Decreased balance;Decreased mobility;Decreased strength;Decreased knowledge of use of DME;Decreased safety awareness;Decreased knowledge of  precautions;Cardiopulmonary status limiting activity       PT Treatment Interventions DME instruction;Gait training;Functional mobility training;Therapeutic exercise;Therapeutic activities;Stair training;Balance training;Patient/family education    PT Goals (Current goals can be found in the Care Plan section)  Acute Rehab PT Goals Patient Stated Goal: to go home PT Goal Formulation: With patient Time For Goal Achievement: 07/25/17 Potential to Achieve Goals: Good    Frequency Min 3X/week   Barriers to discharge        Co-evaluation               AM-PAC PT "6 Clicks" Daily Activity  Outcome Measure Difficulty turning over in bed (including adjusting bedclothes, sheets and blankets)?: None Difficulty moving from lying on back to sitting on the side of the bed? : None Difficulty sitting down on and standing up from a chair with arms (e.g., wheelchair, bedside commode, etc,.)?: None Help needed moving to and from a bed to chair (including a wheelchair)?: A Little Help needed walking in hospital room?: A Little Help needed climbing 3-5 steps with a railing? : A Lot 6 Click Score: 20    End of Session Equipment Utilized During Treatment: Gait belt Activity Tolerance: Patient tolerated treatment well Patient left: in chair;with call bell/phone within reach Nurse Communication: Mobility status PT Visit Diagnosis: Unsteadiness on feet (R26.81);Muscle weakness (generalized) (M62.81)    Time: 4580-9983 PT Time Calculation (min) (ACUTE ONLY): 14 min   Charges:   PT Evaluation $PT Eval Moderate Complexity: 1 Mod     PT G Codes:        Lauran Romanski,PT Acute Rehabilitation [phone] (pager)   Berline Lopes 07/11/2017, 10:41 AM

## 2017-07-11 NOTE — Progress Notes (Addendum)
Advanced Heart Failure Rounding Note   Subjective:    Remains on prednisone and colchicine for acute gout.   Remains on dobutamine 2.5 mcg.   Feeling better. Co-ox 67%. I/Os -2L but weight up 3 pounds on lasix 80 po daily  Less anxious. No CP or SOB. + emesis over night  Creatinine 2.98 -> 2.55-> 2.48  CVP 4  PA 44/12  CO 7.5 CI 4.21  CO-OX 77%.   Echo 07/07/17 EF 20-25% mod Kelly    Objective:   Weight Range:  Vital Signs:   Temp:  [96.8 F (36 C)-98.5 F (36.9 C)] 98.4 F (36.9 C) (11/02 0741) Pulse Rate:  [95-136] 101 (11/02 0741) Resp:  [10-22] 11 (11/02 0741) BP: (101-160)/(73-110) 123/83 (11/02 0741) SpO2:  [93 %-100 %] 100 % (11/02 0741) Weight:  [137 lb 12.6 oz (62.5 kg)] 137 lb 12.6 oz (62.5 kg) (11/02 0443) Last BM Date: 07/08/17  Weight change: Filed Weights   07/09/17 0500 07/10/17 0700 07/11/17 0443  Weight: 122 lb 5.7 oz (55.5 kg) 134 lb 11.2 oz (61.1 kg) 137 lb 12.6 oz (62.5 kg)    Intake/Output:   Intake/Output Summary (Last 24 hours) at 07/11/17 0755 Last data filed at 07/11/17 0600  Gross per 24 hour  Intake          1620.57 ml  Output             3560 ml  Net         -1939.43 ml     Physical Exam: General:  Weak appearing. No resp difficulty HEENT: normal Neck: supple. RIJ cordis Carotids 2+ bilat; no bruits. No lymphadenopathy or thryomegaly appreciated. Cor: PMI laterally displaced. Tachy regular +s3 Lungs: clear Abdomen: soft, nontender, + distended. No hepatosplenomegaly. No bruits or masses. Good bowel sounds. Extremities: no cyanosis, clubbing, rash, edema Neuro: alert & orientedx3, cranial nerves grossly intact. moves all 4 extremities w/o difficulty. Affect pleasant   Telemetry: Sinus tach 100-110 personally reviewed.    Labs: Basic Metabolic Panel:  Recent Labs Lab 07/07/17 1450 07/08/17 0436 07/08/17 1755 07/09/17 0354 07/10/17 0447 07/11/17 0436  NA 127* 129* 135 139 133* 134*  K 4.0 3.5 3.7 3.5 3.5 3.9    CL 86* 91* 97* 100* 96* 93*  CO2 24 27 28 27 26 31   GLUCOSE 89 103* 110* 101* 238* 114*  BUN 100* 86* 77* 70* 59* 52*  CREATININE 3.91* 3.35* 3.25* 2.98* 2.55* 2.48*  CALCIUM 8.9 8.6* 8.5* 8.7* 8.2* 8.2*  MG 2.3  --   --   --   --   --     Liver Function Tests:  Recent Labs Lab 07/06/17 0433 07/07/17 1450  AST 146* 188*  ALT 79* 108*  ALKPHOS 60 67  BILITOT 1.6* 1.2  PROT 7.3 6.5  ALBUMIN 3.4* 3.0*   No results for input(s): LIPASE, AMYLASE in the last 168 hours. No results for input(s): AMMONIA in the last 168 hours.  CBC:  Recent Labs Lab 07/04/17 1932 07/05/17 1207 07/06/17 0433 07/07/17 1450 07/10/17 0447 07/11/17 0436  WBC 7.6 8.8 8.3 6.4 10.3 13.2*  NEUTROABS 5.7  --   --  4.4  --   --   HGB 12.8* 12.3* 11.9* 11.3* 11.4* 11.4*  HCT 38.7* 37.8* 37.3* 34.5* 35.8* 35.8*  MCV 77.6* 76.5* 77.7* 74.4* 76.5* 75.8*  PLT 358 425 418 416* 290 277    Cardiac Enzymes:  Recent Labs Lab 07/04/17 1932 07/05/17 1207  TROPONINI 0.05* 0.06*  BNP: BNP (last 3 results)  Recent Labs  05/29/17 2152 06/05/17 1537 07/04/17 1932  BNP 3,608.0* 4,465.0* 3,665.0*    ProBNP (last 3 results) No results for input(s): PROBNP in the last 8760 hours.    Other results:  Imaging: No results found.   Medications:     Scheduled Medications: . ALPRAZolam  0.5 mg Oral TID  . busPIRone  5 mg Oral TID  . Chlorhexidine Gluconate Cloth  6 each Topical Daily  . colchicine  0.6 mg Oral Daily  . furosemide  80 mg Oral Daily  . gabapentin  300 mg Oral TID  . isosorbide-hydrALAZINE  2 tablet Oral TID  . potassium chloride  40 mEq Oral Daily  . predniSONE  40 mg Oral Daily  . sodium chloride flush  3 mL Intravenous Q12H    Infusions: . sodium chloride    . sodium chloride 10 mL/hr at 07/10/17 2000  . dexmedetomidine (PRECEDEX) IV infusion Stopped (07/09/17 0830)  . DOBUTamine 2.5 mcg/kg/min (07/10/17 2000)  . levofloxacin (LEVAQUIN) IV Stopped (07/10/17 1753)     PRN Medications: sodium chloride, acetaminophen, alum & mag hydroxide-simeth, magnesium hydroxide, ondansetron (ZOFRAN) IV, sodium chloride flush   Assessment:   Kelly Leonard is a 61 year old with a history of NICM, CKD Stage III, HTN, anxiety, ICD, and chronic systolic heart failure.  Admitted with A/C systolic heart failure,    Plan/Discussion:     1. A/C Systolic Heart Failure  -> cardiogenic shock - NYHA IIIb. NICM. ECHO EF 05/2017 EF 20-25%.  - remains on dobutamine 2.5 mcg. CO-OX 67%.  - On bidil 2 tabs tid.   - no Spiro/ACE/ARB/ARNI with CKD IV.  - No bb with low output.  - Likely not candidate for advanced therapies due to severe CKD and social situation/severe anxiety/noncomplaince - Will continue dobutamine for at least one more day to see how good creatinine will get. Baseline creatinine has been ~2.5-3.0 - Check CVP. Continue lasix 80 daily for now. Will adjust as needed suspect may be a torsemide guy 2. Hyponatremia - Sodium 134. Asymptomatic 3. AKI on CKD Stage IV - Creatinine baseline 2.8-3.0. - Creatinine trending down 2.98> 2.55   - Likely component of cardiorenal syndrome - Will see how good renal function will get with intoropc support -Renal US completed- Polycystic  kidney disease. Discussed with Nephrology  - HEP panels --> HCV Ab >11. HCV Nucleic Acid Amplification pending.  HIV--> NR  4.  Anxiety  - severe. - Psychiatry consult appreciated.  - Improved on current meds. Will cut gabapentin to 300 bid 5. UTI - UA with large amount of leukocytes. Urine culture - no growth. On levaquin. Will give 7 day course due to complicated UTI 6. Acute Gout  - Uric Acid 15.5 day 3/3 Prednisone. Received 1.2 colchicine x1.   Length of Stay: 4  Arvilla MeresBensimhon, Daniel MD  07/11/2017, 7:55 AM  Advanced Heart Failure Team Pager [phone] (M-F; 7a - 4p)  Please contact [facility] for night-coverage after hours (4p -7a ) and weekends on [email or web address]

## 2017-07-12 LAB — BASIC METABOLIC PANEL
ANION GAP: 9 (ref 5–15)
BUN: 51 mg/dL — ABNORMAL HIGH (ref 6–20)
CALCIUM: 8.4 mg/dL — AB (ref 8.9–10.3)
CO2: 33 mmol/L — AB (ref 22–32)
Chloride: 93 mmol/L — ABNORMAL LOW (ref 101–111)
Creatinine, Ser: 2.46 mg/dL — ABNORMAL HIGH (ref 0.61–1.24)
GFR calc non Af Amer: 27 mL/min — ABNORMAL LOW (ref >60)
GFR, EST AFRICAN AMERICAN: 31 mL/min — AB (ref >60)
Glucose, Bld: 125 mg/dL — ABNORMAL HIGH (ref 65–99)
Potassium: 4 mmol/L (ref 3.5–5.1)
SODIUM: 135 mmol/L (ref 135–145)

## 2017-07-12 LAB — COOXEMETRY PANEL
CARBOXYHEMOGLOBIN: 2 % — AB (ref 0.5–1.5)
METHEMOGLOBIN: 1 % (ref 0.0–1.5)
O2 Saturation: 70.6 %
Total hemoglobin: 11.6 g/dL — ABNORMAL LOW (ref 12.0–16.0)

## 2017-07-12 LAB — CBC
HEMATOCRIT: 36 % — AB (ref 39.0–52.0)
HEMOGLOBIN: 11.2 g/dL — AB (ref 13.0–17.0)
MCH: 23.8 pg — ABNORMAL LOW (ref 26.0–34.0)
MCHC: 31.1 g/dL (ref 30.0–36.0)
MCV: 76.6 fL — ABNORMAL LOW (ref 78.0–100.0)
Platelets: 298 10*3/uL (ref 150–400)
RBC: 4.7 MIL/uL (ref 4.22–5.81)
RDW: 16.4 % — AB (ref 11.5–15.5)
WBC: 13.3 10*3/uL — AB (ref 4.0–10.5)

## 2017-07-12 MED ORDER — FUROSEMIDE 10 MG/ML IJ SOLN
80.0000 mg | Freq: Once | INTRAMUSCULAR | Status: AC
  Administered 2017-07-12: 80 mg via INTRAVENOUS
  Filled 2017-07-12: qty 8

## 2017-07-12 MED ORDER — DOBUTAMINE IN D5W 4-5 MG/ML-% IV SOLN
1.2500 ug/kg/min | INTRAVENOUS | Status: DC
Start: 2017-07-12 — End: 2017-07-13

## 2017-07-12 MED ORDER — BUMETANIDE 2 MG PO TABS
2.0000 mg | ORAL_TABLET | Freq: Two times a day (BID) | ORAL | Status: DC
  Administered 2017-07-12 – 2017-07-14 (×5): 2 mg via ORAL
  Filled 2017-07-12 (×6): qty 1

## 2017-07-12 NOTE — Progress Notes (Addendum)
Advanced Heart Failure Rounding Note   Subjective:     Remains on dobutamine 2.5 mcg. Co-ox 71%   Feeling better. No SOB or orthopnea.  Anxiety improving. Creatinine plateaued at 2.46.   Weight up 2 pounds on lasix 80 po daily.  Volume status worsening. CVP 15    Echo 07/07/17 EF 20-25% mod MR    Objective:   Weight Range:  Vital Signs:   Temp:  [97.3 F (36.3 C)-98.9 F (37.2 C)] 97.3 F (36.3 C) (11/03 0739) Pulse Rate:  [104-121] 111 (11/03 0739) Resp:  [11-28] 14 (11/03 0900) BP: (107-138)/(40-105) 118/77 (11/03 0900) SpO2:  [93 %-100 %] 96 % (11/03 0739) Weight:  [63.3 kg (139 lb 8.8 oz)] 63.3 kg (139 lb 8.8 oz) (11/03 0300) Last BM Date: 07/10/17  Weight change: Filed Weights   07/10/17 0700 07/11/17 0443 07/12/17 0300  Weight: 61.1 kg (134 lb 11.2 oz) 62.5 kg (137 lb 12.6 oz) 63.3 kg (139 lb 8.8 oz)    Intake/Output:   Intake/Output Summary (Last 24 hours) at 07/12/17 0956 Last data filed at 07/12/17 0900  Gross per 24 hour  Intake           1537.6 ml  Output             2250 ml  Net           -712.4 ml     Physical Exam: General:  Weak appearing. No resp difficulty HEENT: normal Neck: supple. JVP to ear  RIJ introducer Carotids 2+ bilat; no bruits. No lymphadenopathy or thryomegaly appreciated. Cor: PMI laterally displaced. Tachy regular +s3  Lungs: clear Abdomen: soft, nontender, nondistended. No hepatosplenomegaly. No bruits or masses. Good bowel sounds. Extremities: no cyanosis, clubbing, rash, trace edema Neuro: alert & orientedx3, cranial nerves grossly intact. moves all 4 extremities w/o difficulty. Affect pleasant   Telemetry: Sinus tach 100-110 personally reviewed.    Labs: Basic Metabolic Panel:  Recent Labs Lab 07/07/17 1450  07/08/17 1755 07/09/17 0354 07/10/17 0447 07/11/17 0436 07/12/17 0425  NA 127*  < > 135 139 133* 134* 135  K 4.0  < > 3.7 3.5 3.5 3.9 4.0  CL 86*  < > 97* 100* 96* 93* 93*  CO2 24  < > 28 27 26 31   33*  GLUCOSE 89  < > 110* 101* 238* 114* 125*  BUN 100*  < > 77* 70* 59* 52* 51*  CREATININE 3.91*  < > 3.25* 2.98* 2.55* 2.48* 2.46*  CALCIUM 8.9  < > 8.5* 8.7* 8.2* 8.2* 8.4*  MG 2.3  --   --   --   --   --   --   < > = values in this interval not displayed.  Liver Function Tests:  Recent Labs Lab 07/06/17 0433 07/07/17 1450  AST 146* 188*  ALT 79* 108*  ALKPHOS 60 67  BILITOT 1.6* 1.2  PROT 7.3 6.5  ALBUMIN 3.4* 3.0*   No results for input(s): LIPASE, AMYLASE in the last 168 hours. No results for input(s): AMMONIA in the last 168 hours.  CBC:  Recent Labs Lab 07/06/17 0433 07/07/17 1450 07/10/17 0447 07/11/17 0436 07/12/17 0425  WBC 8.3 6.4 10.3 13.2* 13.3*  NEUTROABS  --  4.4  --   --   --   HGB 11.9* 11.3* 11.4* 11.4* 11.2*  HCT 37.3* 34.5* 35.8* 35.8* 36.0*  MCV 77.7* 74.4* 76.5* 75.8* 76.6*  PLT 418 416* 290 277 298    Cardiac Enzymes:  Recent Labs Lab 07/05/17 1207  TROPONINI 0.06*    BNP: BNP (last 3 results)  Recent Labs  05/29/17 2152 06/05/17 1537 07/04/17 1932  BNP 3,608.0* 4,465.0* 3,665.0*    ProBNP (last 3 results) No results for input(s): PROBNP in the last 8760 hours.    Other results:  Imaging: No results found.   Medications:     Scheduled Medications: . allopurinol  100 mg Oral Daily  . ALPRAZolam  0.5 mg Oral TID  . busPIRone  5 mg Oral TID  . Chlorhexidine Gluconate Cloth  6 each Topical Daily  . colchicine  0.6 mg Oral Daily  . furosemide  80 mg Oral Daily  . gabapentin  300 mg Oral BID  . isosorbide-hydrALAZINE  2 tablet Oral TID  . levofloxacin  250 mg Oral Daily  . potassium chloride  40 mEq Oral Daily  . sodium chloride flush  3 mL Intravenous Q12H    Infusions: . sodium chloride    . sodium chloride 10 mL/hr at 07/12/17 0700  . dexmedetomidine (PRECEDEX) IV infusion Stopped (07/09/17 0830)  . DOBUTamine 2.5 mcg/kg/min (07/12/17 0700)    PRN Medications: sodium chloride, acetaminophen, alum &  mag hydroxide-simeth, magnesium hydroxide, ondansetron (ZOFRAN) IV, sodium chloride flush   Assessment:   [name] is a 61 year old with a history of NICM, CKD Stage III, HTN, anxiety, ICD, and chronic systolic heart failure.  Admitted with A/C systolic heart failure,    Plan/Discussion:     1. A/C Systolic Heart Failure  -> cardiogenic shock - NICM. ECHO EF 05/2017 EF 20-25%.  - remains on dobutamine 2.5 mcg. CO-OX 70%. Will wean dobutamine to 1.25. - On bidil 2 tabs tid.   - CVP climbing on po lasix. Will give one dose IV lasix today. Plan was to switch to torsemide but has allergy. Will try bumex 2 bid - no Spiro/ACE/ARB/ARNI with CKD IV.  - No bb with low output.  - Likely not candidate for advanced therapies due to severe CKD and social situation/severe anxiety/noncomplaince - Will continue dobutamine for at least one more day to see how good creatinine will get. Baseline creatinine has been ~2.5-3.0 2. Hyponatremia - Sodium improved 135 today 3. AKI on CKD Stage IV - Creatinine baseline 2.8-3.0 in setting of PCKD and HF - Creatinine trending down 2.98> 2.55 > 2.48> 2.46 suspect it will plateau here  - Likely component of cardiorenal syndrome -Renal US completed- Polycystic  kidney disease. Discussed with Nephrology  - HEP panels --> HCV Ab >11. HCV Nucleic Acid Amplification pending.  HIV--> NR  4.  Anxiety  - severe. - Psychiatry consult appreciated.  - Improved on current meds. Will continue 5. UTI - UA with large amount of leukocytes. Urine culture - no growth. On levaquin. Will give 7 day course due to complicated UTI 6. Acute Gout  - Treated with prednisone an colchicine. Now on allopurinol. Discussed with PharmD  Length of Stay: 5  Shalika Arntz MD  07/12/2017, 9:56 AM  Advanced Heart Failure Team Pager [phone] (M-F; 7a - 4p)  Please contact [facility] for night-coverage after hours (4p -7a ) and weekends on [email or web address]

## 2017-07-13 ENCOUNTER — Other Ambulatory Visit: Payer: Self-pay

## 2017-07-13 LAB — BASIC METABOLIC PANEL
Anion gap: 6 (ref 5–15)
BUN: 46 mg/dL — AB (ref 6–20)
CALCIUM: 8.2 mg/dL — AB (ref 8.9–10.3)
CO2: 32 mmol/L (ref 22–32)
CREATININE: 2.62 mg/dL — AB (ref 0.61–1.24)
Chloride: 98 mmol/L — ABNORMAL LOW (ref 101–111)
GFR calc Af Amer: 29 mL/min — ABNORMAL LOW (ref >60)
GFR, EST NON AFRICAN AMERICAN: 25 mL/min — AB (ref >60)
GLUCOSE: 90 mg/dL (ref 65–99)
POTASSIUM: 4.2 mmol/L (ref 3.5–5.1)
SODIUM: 136 mmol/L (ref 135–145)

## 2017-07-13 LAB — CBC
HCT: 34.7 % — ABNORMAL LOW (ref 39.0–52.0)
Hemoglobin: 10.5 g/dL — ABNORMAL LOW (ref 13.0–17.0)
MCH: 23.7 pg — AB (ref 26.0–34.0)
MCHC: 30.3 g/dL (ref 30.0–36.0)
MCV: 78.3 fL (ref 78.0–100.0)
PLATELETS: 250 10*3/uL (ref 150–400)
RBC: 4.43 MIL/uL (ref 4.22–5.81)
RDW: 16.8 % — AB (ref 11.5–15.5)
WBC: 8.2 10*3/uL (ref 4.0–10.5)

## 2017-07-13 LAB — COOXEMETRY PANEL
CARBOXYHEMOGLOBIN: 1.8 % — AB (ref 0.5–1.5)
Methemoglobin: 1.2 % (ref 0.0–1.5)
O2 SAT: 76.4 %
TOTAL HEMOGLOBIN: 10.7 g/dL — AB (ref 12.0–16.0)

## 2017-07-13 NOTE — Progress Notes (Addendum)
Advanced Heart Failure Rounding Note   Subjective:     Dobutamine dropped to 1.25 yesterday. Co-ox 76%   Feels good. Denies SOB. Anxiety improved. Creatinine up slightly  2.46 -> 2.6.   Switched from lasix to bumex yesterday (has torsemide allergy) . Weight down 5 pounds. CVP 5  Echo 07/07/17 EF 20-25% mod MR    Objective:   Weight Range:  Vital Signs:   Temp:  [97 F (36.1 C)-100.3 F (37.9 C)] 97 F (36.1 C) (11/04 1204) Pulse Rate:  [93-113] 110 (11/04 1300) Resp:  [10-19] 10 (11/04 1300) BP: (97-131)/(56-105) 125/92 (11/04 1300) SpO2:  [93 %-100 %] 100 % (11/04 1300) Weight:  [61.1 kg (134 lb 11.2 oz)] 61.1 kg (134 lb 11.2 oz) (11/04 0400) Last BM Date: 07/13/17  Weight change: Filed Weights   07/11/17 0443 07/12/17 0300 07/13/17 0400  Weight: 62.5 kg (137 lb 12.6 oz) 63.3 kg (139 lb 8.8 oz) 61.1 kg (134 lb 11.2 oz)    Intake/Output:   Intake/Output Summary (Last 24 hours) at 07/13/2017 1309 Last data filed at 07/13/2017 1200 Gross per 24 hour  Intake 1138 ml  Output 2775 ml  Net -1637 ml     Physical Exam: General:  Well appearing. No resp difficulty HEENT: normal Neck: supple. RIJ introducer. Carotids 2+ bilat; no bruits. No lymphadenopathy or thryomegaly appreciated. Cor: PMI laterally displaced. +s3 Lungs: clear Abdomen: soft, nontender, nondistended. No hepatosplenomegaly. No bruits or masses. Good bowel sounds. Extremities: no cyanosis, clubbing, rash, edema Neuro: alert & orientedx3, cranial nerves grossly intact. moves all 4 extremities w/o difficulty. Affect pleasant   Telemetry: Sinus tach 100-110 personally reviewed.    Labs: Basic Metabolic Panel: Recent Labs  Lab 07/07/17 1450  07/09/17 0354 07/10/17 0447 07/11/17 0436 07/12/17 0425 07/13/17 0351  NA 127*   < > 139 133* 134* 135 136  K 4.0   < > 3.5 3.5 3.9 4.0 4.2  CL 86*   < > 100* 96* 93* 93* 98*  CO2 24   < > 27 26 31  33* 32  GLUCOSE 89   < > 101* 238* 114* 125* 90    BUN 100*   < > 70* 59* 52* 51* 46*  CREATININE 3.91*   < > 2.98* 2.55* 2.48* 2.46* 2.62*  CALCIUM 8.9   < > 8.7* 8.2* 8.2* 8.4* 8.2*  MG 2.3  --   --   --   --   --   --    < > = values in this interval not displayed.    Liver Function Tests: Recent Labs  Lab 07/07/17 1450  AST 188*  ALT 108*  ALKPHOS 67  BILITOT 1.2  PROT 6.5  ALBUMIN 3.0*   No results for input(s): LIPASE, AMYLASE in the last 168 hours. No results for input(s): AMMONIA in the last 168 hours.  CBC: Recent Labs  Lab 07/07/17 1450 07/10/17 0447 07/11/17 0436 07/12/17 0425 07/13/17 0351  WBC 6.4 10.3 13.2* 13.3* 8.2  NEUTROABS 4.4  --   --   --   --   HGB 11.3* 11.4* 11.4* 11.2* 10.5*  HCT 34.5* 35.8* 35.8* 36.0* 34.7*  MCV 74.4* 76.5* 75.8* 76.6* 78.3  PLT 416* 290 277 298 250    Cardiac Enzymes: No results for input(s): CKTOTAL, CKMB, CKMBINDEX, TROPONINI in the last 168 hours.  BNP: BNP (last 3 results) Recent Labs    05/29/17 2152 06/05/17 1537 07/04/17 1932  BNP 3,608.0* 4,465.0* 3,665.0*  ProBNP (last 3 results) No results for input(s): PROBNP in the last 8760 hours.    Other results:  Imaging: No results found.   Medications:     Scheduled Medications: . allopurinol  100 mg Oral Daily  . ALPRAZolam  0.5 mg Oral TID  . bumetanide  2 mg Oral BID  . busPIRone  5 mg Oral TID  . Chlorhexidine Gluconate Cloth  6 each Topical Daily  . colchicine  0.6 mg Oral Daily  . gabapentin  300 mg Oral BID  . isosorbide-hydrALAZINE  2 tablet Oral TID  . levofloxacin  250 mg Oral Daily  . potassium chloride  40 mEq Oral Daily  . sodium chloride flush  3 mL Intravenous Q12H    Infusions: . sodium chloride    . sodium chloride 10 mL/hr at 07/13/17 0800  . dexmedetomidine (PRECEDEX) IV infusion Stopped (07/09/17 0830)  . DOBUTamine 1.25 mcg/kg/min (07/13/17 0800)    PRN Medications: sodium chloride, acetaminophen, alum & mag hydroxide-simeth, magnesium hydroxide, ondansetron  (ZOFRAN) IV, sodium chloride flush   Assessment:   Mr Karl ItoMcCollum is a 61 year old with a history of NICM, CKD Stage III, HTN, anxiety, ICD, and chronic systolic heart failure.  Admitted with A/C systolic heart failure,    Plan/Discussion:     1. A/C Systolic Heart Failure  -> cardiogenic shock - NICM. ECHO EF 05/2017 EF 20-25%.  - Remains tenuous. Dobutamine turned down to 1.25 yesterday. CO-OX 76%. Will stop dobutamine - On bidil 2 tabs tid.   - CVP improved on bumex 2 bid (had allergy to torsemide) - no Spiro/ACE/ARB/ARNI with CKD IV.  - No bb with low output.  - Likely not candidate for advanced therapies due to severe CKD and social situation/severe anxiety/noncomplaince - Creatinine up to 2.6 today. Will follow off dobutamine. Baseline creatinine has been ~2.5-3.0 - Remove cordis tomorrow 2. Hyponatremia - Sodium improved 136 today 3. AKI on CKD Stage IV - Creatinine baseline 2.8-3.0 in setting of PCKD and HF - Creatinine trending down 2.98> 2.55 > 2.48> 2.46 -> 2.6  Hopefully will not increase further,  - Likely component of cardiorenal syndrome -Renal US completed- Polycystic  kidney disease. 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 Will need ID f/u at d/c.  HIV--> NR  4.  Anxiety  - severe. - Psychiatry consult appreciated.  - Improved on current meds. Will continue 5. UTI - UA with large amount of leukocytes. Urine culture - no growth. On levaquin. Will give 7 day course due to complicated UTI. Day 3/7 6. Acute Gout  - Treated with prednisone an colchicine. Now on allopurinol. Discussed with PharmD  Can go to SDU.   Length of Stay: 6  Bensimhon, Daniel MD  07/13/2017, 1:09 PM  Advanced Heart Failure Team Pager [phone] (M-F; 7a - 4p)  Please contact [facility] for night-coverage after hours (4p -7a ) and weekends on [email or web address]

## 2017-07-14 ENCOUNTER — Ambulatory Visit: Payer: BLUE CROSS/BLUE SHIELD | Admitting: Family

## 2017-07-14 LAB — COOXEMETRY PANEL
CARBOXYHEMOGLOBIN: 1.8 % — AB (ref 0.5–1.5)
Carboxyhemoglobin: 1.5 % (ref 0.5–1.5)
METHEMOGLOBIN: 0.8 % (ref 0.0–1.5)
Methemoglobin: 0.8 % (ref 0.0–1.5)
O2 Saturation: 53.6 %
O2 Saturation: 63.5 %
TOTAL HEMOGLOBIN: 11.2 g/dL — AB (ref 12.0–16.0)
Total hemoglobin: 11.6 g/dL — ABNORMAL LOW (ref 12.0–16.0)

## 2017-07-14 LAB — CBC
HEMATOCRIT: 34.9 % — AB (ref 39.0–52.0)
Hemoglobin: 11.1 g/dL — ABNORMAL LOW (ref 13.0–17.0)
MCH: 24.5 pg — ABNORMAL LOW (ref 26.0–34.0)
MCHC: 31.8 g/dL (ref 30.0–36.0)
MCV: 77 fL — AB (ref 78.0–100.0)
Platelets: 284 10*3/uL (ref 150–400)
RBC: 4.53 MIL/uL (ref 4.22–5.81)
RDW: 16.7 % — AB (ref 11.5–15.5)
WBC: 9.6 10*3/uL (ref 4.0–10.5)

## 2017-07-14 LAB — BASIC METABOLIC PANEL
ANION GAP: 5 (ref 5–15)
BUN: 46 mg/dL — ABNORMAL HIGH (ref 6–20)
CALCIUM: 8.2 mg/dL — AB (ref 8.9–10.3)
CO2: 30 mmol/L (ref 22–32)
Chloride: 97 mmol/L — ABNORMAL LOW (ref 101–111)
Creatinine, Ser: 2.54 mg/dL — ABNORMAL HIGH (ref 0.61–1.24)
GFR calc Af Amer: 30 mL/min — ABNORMAL LOW (ref >60)
GFR calc non Af Amer: 26 mL/min — ABNORMAL LOW (ref >60)
GLUCOSE: 97 mg/dL (ref 65–99)
POTASSIUM: 4.5 mmol/L (ref 3.5–5.1)
Sodium: 132 mmol/L — ABNORMAL LOW (ref 135–145)

## 2017-07-14 MED ORDER — POTASSIUM CHLORIDE CRYS ER 20 MEQ PO TBCR
20.0000 meq | EXTENDED_RELEASE_TABLET | Freq: Every day | ORAL | Status: DC
  Administered 2017-07-14 – 2017-07-15 (×2): 20 meq via ORAL
  Filled 2017-07-14 (×2): qty 1

## 2017-07-14 NOTE — Progress Notes (Signed)
Physical Therapy Treatment Patient Details Name: Kelly Leonard MRN: [id number] DOB: 01/05/1956 Today's Date: 07/14/2017    History of Present Illness Mr Ryland is a 61 year old with a history of NICM, CKD Stage III, HTN, anxiety, ICD, and chronic systolic heart failure.  Admit with CHF.      PT Comments    Pt has progressed very well, ambulated >400' and practiced 2 flights of stairs with no difficulty. HR 100 bpm before ambulation, 112 bpm with stairs, O2 sats maintained in 90's on RA. Worked on high level balance exercises that pt can continue at home. Pt demonstrated understanding. Does not need PT follow up acutely or post acutely at this point. PT signing off. Pt instructed to continue ambulating on unit.    Follow Up Recommendations  No PT follow up     Equipment Recommendations  None recommended by PT    Recommendations for Other Services       Precautions / Restrictions Precautions Precautions: Fall Restrictions Weight Bearing Restrictions: No    Mobility  Bed Mobility Overal bed mobility: Independent                Transfers Overall transfer level: Independent                  Ambulation/Gait Ambulation/Gait assistance: Modified independent (Device/Increase time) Ambulation Distance (Feet): 450 Feet Assistive device: None(held onto IV pole) Gait Pattern/deviations: WFL(Within Functional Limits) Gait velocity: WFL Gait velocity interpretation: at or above normal speed for age/gender General Gait Details: pt with fast pace today and steady except with quick turns though even these improved with practice. No LOB or significant gait deviations. No support needed   Stairs Stairs: Yes   Stair Management: One rail Right;Alternating pattern;Forwards Number of Stairs: 20 General stair comments: HR up to 112 bpm (from low 100's) after stairs, O2 sats remained mid to upper 90's. No difficulty completing 2 flights of stairs, short rest in  between  Wheelchair Mobility    Modified Rankin (Stroke Patients Only)       Balance Overall balance assessment: Modified Independent Sitting-balance support: No upper extremity supported;Feet supported Sitting balance-Leahy Scale: Good     Standing balance support: During functional activity;No upper extremity supported Standing balance-Leahy Scale: Good Standing balance comment: worked on dynamic standing balance today, pt much improved, mod I with balance activities in double limb and SL stance             High level balance activites: Backward walking;Turns High Level Balance Comments: worked on slow walking with SL stance hold 2 seconds each step. Worked on SL stance with upper body movement and discussed how he could do this at home using a basketball (he loves basketball)            Cognition Arousal/Alertness: Awake/alert Behavior During Therapy: Anxious Overall Cognitive Status: Within Functional Limits for tasks assessed                                        Exercises      General Comments General comments (skin integrity, edema, etc.): discussed activity level upon returning home as well as continuing balance exercises that we worked on today      Pertinent Vitals/Pain Pain Assessment: No/denies pain    Home Living                        Prior Function            PT Goals (current goals can now be found in the care plan section) Acute Rehab PT Goals Patient Stated Goal: to go home PT Goal Formulation: With patient Time For Goal Achievement: 07/25/17 Potential to Achieve Goals: Good Progress towards PT goals: Goals met/education completed, patient discharged from PT    Frequency    Min 3X/week      PT Plan Discharge plan needs to be updated    Co-evaluation              AM-PAC PT "6 Clicks" Daily Activity  Outcome Measure  Difficulty turning over in bed (including adjusting bedclothes, sheets and  blankets)?: None Difficulty moving from lying on back to sitting on the side of the bed? : None Difficulty sitting down on and standing up from a chair with arms (e.g., wheelchair, bedside commode, etc,.)?: None Help needed moving to and from a bed to chair (including a wheelchair)?: None Help needed walking in hospital room?: None Help needed climbing 3-5 steps with a railing? : None 6 Click Score: 24    End of Session   Activity Tolerance: Patient tolerated treatment well Patient left: with call bell/phone within reach;with family/visitor present;in bed Nurse Communication: Mobility status PT Visit Diagnosis: Unsteadiness on feet (R26.81);Muscle weakness (generalized) (M62.81)     Time: 1040-1100 PT Time Calculation (min) (ACUTE ONLY): 20 min  Charges:  $Gait Training: 8-22 mins                    G Codes:        , PT  Acute Rehab Services  [phone]     L  07/14/2017, 1:11 PM   

## 2017-07-14 NOTE — Progress Notes (Signed)
Patient ambulated in hallway 370 ft, patient tolerated walk well, patient up to chair, up ad lib in room, call bell in reach, will continue to monitor.  Hermina Barters, RN

## 2017-07-14 NOTE — Care Management Note (Addendum)
Case Management Note  Patient Details  Name: Daisean Almasri MRN: [id number] Date of Birth: 02-Jun-1956  Subjective/Objective:    From home with wife,  She will assist him at home ,  Transfer from [facility],  presents with Acute/Chronic HF, cardiogenic shock, CKD stage 3, HTN, anxiety, ICD.  Psych consulted due to anxiety.  Patient ambulated with physical therapy, previously was rec HHPT but looks like he has improved and does not need HHPT.   He has PCP and medication coverage.                                  Action/Plan: NCM will follow for dc needs.   Expected Discharge Date:                  Expected Discharge Plan:  Home/Self Care  In-House Referral:     Discharge planning Services  CM Consult  Post Acute Care Choice:    Choice offered to:     DME Arranged:    DME Agency:     HH Arranged:    HH Agency:     Status of Service:  Completed, signed off  If discussed at [employer] of Stay Meetings, dates discussed:    Additional Comments:  Leone Haven, RN 07/14/2017, 10:46 AM

## 2017-07-14 NOTE — Progress Notes (Signed)
Advanced Heart Failure Rounding Note   Subjective:    Dobutamine stopped yesterday. Co-ox 53.6%.    Creatinine up slightly  2.46 -> 2.6 -> 2.54  Feeling good this am. Denies SOB, CP, lightheadedness or dizziness. Wants to know when he will be able to go back to work with [employer]. He lifts upwards of 100 lbs multiple times a day at work. Has disability papers for completion at bedside.   Switched from lasix to bumex 06/11/17 (has torsemide allergy) . Negative 889 cc though weight shows up 3 lbs. CVP 7-8.  Echo 07/07/17 EF 20-25% mod MR   Objective:   Weight Range:  Vital Signs:   Temp:  [97 F (36.1 C)-100.3 F (37.9 C)] 98.6 F (37 C) (11/05 0700) Pulse Rate:  [96-118] 104 (11/05 0700) Resp:  [10-25] 21 (11/05 0700) BP: (106-151)/(56-99) 121/94 (11/05 0700) SpO2:  [93 %-100 %] 99 % (11/05 0700) Weight:  [137 lb 2 oz (62.2 kg)] 137 lb 2 oz (62.2 kg) (11/05 0600) Last BM Date: 07/13/17  Weight change: Filed Weights   07/12/17 0300 07/13/17 0400 07/14/17 0600  Weight: 139 lb 8.8 oz (63.3 kg) 134 lb 11.2 oz (61.1 kg) 137 lb 2 oz (62.2 kg)   Intake/Output:   Intake/Output Summary (Last 24 hours) at 07/14/2017 0746 Last data filed at 07/14/2017 0400 Gross per 24 hour  Intake 1136 ml  Output 2025 ml  Net -889 ml    Physical Exam: General: Fatigued appearing. No resp difficulty. HEENT: Normal Neck: Supple. JVP 7-8 cm. RIJ introducer.. Carotids 2+ bilat; no bruits. No thyromegaly or nodule noted. Cor: PMI laterally displaced. + S3.  Lungs: CTAB, normal effort. Abdomen: Soft, non-tender, non-distended, no HSM. No bruits or masses. +BS  Extremities: No cyanosis, clubbing, or rash. R and LLE no edema.  Neuro: Alert & orientedx3, cranial nerves grossly intact. moves all 4 extremities w/o difficulty. Affect pleasant   Telemetry: NSR/ST 90-100s, Personally reviewed.   Labs: Basic Metabolic Panel: Recent Labs  Lab 07/07/17 1450  07/10/17 0447 07/11/17 0436  07/12/17 0425 07/13/17 0351 07/14/17 0537  NA 127*   < > 133* 134* 135 136 132*  K 4.0   < > 3.5 3.9 4.0 4.2 4.5  CL 86*   < > 96* 93* 93* 98* 97*  CO2 24   < > 26 31 33* 32 30  GLUCOSE 89   < > 238* 114* 125* 90 97  BUN 100*   < > 59* 52* 51* 46* 46*  CREATININE 3.91*   < > 2.55* 2.48* 2.46* 2.62* 2.54*  CALCIUM 8.9   < > 8.2* 8.2* 8.4* 8.2* 8.2*  MG 2.3  --   --   --   --   --   --    < > = values in this interval not displayed.   Liver Function Tests: Recent Labs  Lab 07/07/17 1450  AST 188*  ALT 108*  ALKPHOS 67  BILITOT 1.2  PROT 6.5  ALBUMIN 3.0*   No results for input(s): LIPASE, AMYLASE in the last 168 hours. No results for input(s): AMMONIA in the last 168 hours.  CBC: Recent Labs  Lab 07/07/17 1450 07/10/17 0447 07/11/17 0436 07/12/17 0425 07/13/17 0351 07/14/17 0537  WBC 6.4 10.3 13.2* 13.3* 8.2 9.6  NEUTROABS 4.4  --   --   --   --   --   HGB 11.3* 11.4* 11.4* 11.2* 10.5* 11.1*  HCT 34.5* 35.8* 35.8* 36.0* 34.7* 34.9*  MCV 74.4* 76.5* 75.8* 76.6* 78.3 77.0*  PLT 416* 290 277 298 250 284   Cardiac Enzymes: No results for input(s): CKTOTAL, CKMB, CKMBINDEX, TROPONINI in the last 168 hours.  BNP: BNP (last 3 results) Recent Labs    05/29/17 2152 06/05/17 1537 07/04/17 1932  BNP 3,608.0* 4,465.0* 3,665.0*   ProBNP (last 3 results) No results for input(s): PROBNP in the last 8760 hours.  Other results:  Imaging: No results found.   Medications:     Scheduled Medications: . allopurinol  100 mg Oral Daily  . ALPRAZolam  0.5 mg Oral TID  . bumetanide  2 mg Oral BID  . busPIRone  5 mg Oral TID  . Chlorhexidine Gluconate Cloth  6 each Topical Daily  . colchicine  0.6 mg Oral Daily  . gabapentin  300 mg Oral BID  . isosorbide-hydrALAZINE  2 tablet Oral TID  . levofloxacin  250 mg Oral Daily  . potassium chloride  40 mEq Oral Daily  . sodium chloride flush  3 mL Intravenous Q12H    Infusions: . sodium chloride    . sodium chloride  10 mL/hr at 07/13/17 0800  . dexmedetomidine (PRECEDEX) IV infusion Stopped (07/09/17 0830)    PRN Medications: sodium chloride, acetaminophen, alum & mag hydroxide-simeth, magnesium hydroxide, ondansetron (ZOFRAN) IV, sodium chloride flush   Assessment:   Mr Molski is a 61 year old with a history of NICM, CKD Stage III, HTN, anxiety, ICD, and chronic systolic heart failure.  Admitted with A/C systolic heart failure,    Plan/Discussion:    1. A/C Systolic Heart Failure  -> cardiogenic shock - NICM. ECHO EF 05/2017 EF 20-25%.  - Dobutamine stopped 07/13/17, Coox marginal at 53.6%.  - On bidil 2 tabs tid.   - CVP 7-8 this am.  - Continue bumex 2 mg BID (was on bumex 3 mg BID at home) - Pressures stable but no good route for medications titration with low output and CKD. Could consider corlanor but not on any other goal medications.  - No Spiro/ACE/ARB/ARNI with CKD IV.  - No BB with low output.  - Likely not candidate for advanced therapies due to severe CKD and social situation/severe anxiety/noncomplaince - Creatinine 2.6 -> 2.54 this am. Continue to  follow off dobutamine. Baseline creatinine has been ~2.5-3.0 - Possibly remove cordis today.  2. Hyponatremia - Sodium improved 132 today.  3. AKI on CKD Stage IV - Creatinine baseline 2.8-3.0 in setting of PCKD and HF - Creatinine trending down 2.98> 2.55 > 2.48> 2.46 -> 2.6 -> 2.54.   - Likely component of cardiorenal syndrome -Renal US completed- Polycystic  kidney disease. 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 Will need ID f/u at d/c.   HIV--> NR  4.  Anxiety  - Severe. Psychiatry consult appreciated.  - Improved on current meds. Will continue.  5. UTI - UA with large amount of leukocytes. Urine culture - No growth. On levaquin. Will give 7 day course due to complicated UTI. Day 4/7 6. Acute Gout  - Treated with prednisone an colchicine. Now on allopurinol.  - No change.    Length of Stay: [address]  Amyr Waszak, [place]  07/14/2017, 7:52 AM   Advanced Heart Failure Team Pager [phone] (M-F; 7a - 4p)  Please contact [facility] for night-coverage after hours (4p -7a ) and weekends on [email or web address]  Patient seen and examined with the above-signed Advanced Practice Provider and/or Housestaff.  I personally reviewed laboratory data, imaging studies and relevant notes. I independently examined the patient and formulated the important aspects of the plan. I have edited the note to reflect any of my changes or salient points. I have personally discussed the plan with the patient and/or family.  Doing well. Now off dobutamine. Co-ox dropped but feels ok. Walked over 400 feet with PT. Will keep cordis in and watch one more day if co-ox ok and renal function stable tomorrow will plan d/c in am. Anxiety much improved as well. No b-blocker yet. Continue abx for UTI  Arvilla Meres, MD  7:14 PM

## 2017-07-15 LAB — HEPATIC FUNCTION PANEL
ALK PHOS: 81 U/L (ref 38–126)
ALT: 50 U/L (ref 17–63)
AST: 65 U/L — ABNORMAL HIGH (ref 15–41)
Albumin: 2.7 g/dL — ABNORMAL LOW (ref 3.5–5.0)
BILIRUBIN DIRECT: 0.2 mg/dL (ref 0.1–0.5)
BILIRUBIN INDIRECT: 0.4 mg/dL (ref 0.3–0.9)
BILIRUBIN TOTAL: 0.6 mg/dL (ref 0.3–1.2)
TOTAL PROTEIN: 5.9 g/dL — AB (ref 6.5–8.1)

## 2017-07-15 LAB — COOXEMETRY PANEL
Carboxyhemoglobin: 1.3 % (ref 0.5–1.5)
Methemoglobin: 1.1 % (ref 0.0–1.5)
O2 SAT: 80.7 %
TOTAL HEMOGLOBIN: 11.4 g/dL — AB (ref 12.0–16.0)

## 2017-07-15 LAB — MAGNESIUM: MAGNESIUM: 2.1 mg/dL (ref 1.7–2.4)

## 2017-07-15 LAB — BASIC METABOLIC PANEL
ANION GAP: 7 (ref 5–15)
BUN: 55 mg/dL — AB (ref 6–20)
CALCIUM: 8.3 mg/dL — AB (ref 8.9–10.3)
CO2: 28 mmol/L (ref 22–32)
CREATININE: 2.54 mg/dL — AB (ref 0.61–1.24)
Chloride: 99 mmol/L — ABNORMAL LOW (ref 101–111)
GFR calc Af Amer: 30 mL/min — ABNORMAL LOW (ref >60)
GFR, EST NON AFRICAN AMERICAN: 26 mL/min — AB (ref >60)
GLUCOSE: 92 mg/dL (ref 65–99)
Potassium: 4.6 mmol/L (ref 3.5–5.1)
Sodium: 134 mmol/L — ABNORMAL LOW (ref 135–145)

## 2017-07-15 MED ORDER — ISOSORB DINITRATE-HYDRALAZINE 20-37.5 MG PO TABS: 2.0000 | ORAL_TABLET | Freq: Three times a day (TID) | ORAL | 6 refills | Status: DC

## 2017-07-15 MED ORDER — FUROSEMIDE 10 MG/ML IJ SOLN
80.0000 mg | Freq: Once | INTRAMUSCULAR | Status: AC
  Administered 2017-07-15: 80 mg via INTRAVENOUS
  Filled 2017-07-15: qty 8

## 2017-07-15 MED ORDER — GABAPENTIN 300 MG PO CAPS: 300.0000 mg | ORAL_CAPSULE | Freq: Two times a day (BID) | ORAL | 3 refills | Status: DC

## 2017-07-15 MED ORDER — BUSPIRONE HCL 5 MG PO TABS
5.0000 mg | ORAL_TABLET | Freq: Three times a day (TID) | ORAL | 3 refills | Status: AC
Start: 2017-07-15 — End: ?

## 2017-07-15 MED ORDER — POTASSIUM CHLORIDE CRYS ER 20 MEQ PO TBCR: 20.0000 meq | EXTENDED_RELEASE_TABLET | Freq: Every day | ORAL | 6 refills | Status: DC

## 2017-07-15 MED ORDER — ALLOPURINOL 100 MG PO TABS: 100.0000 mg | ORAL_TABLET | Freq: Every day | ORAL | 3 refills | Status: DC

## 2017-07-15 MED ORDER — BUMETANIDE 2 MG PO TABS
3.0000 mg | ORAL_TABLET | Freq: Two times a day (BID) | ORAL | Status: DC
  Filled 2017-07-15: qty 1

## 2017-07-15 MED ORDER — BUMETANIDE 1 MG PO TABS: 3.0000 mg | ORAL_TABLET | Freq: Two times a day (BID) | ORAL | 3 refills | Status: DC

## 2017-07-15 NOTE — Progress Notes (Signed)
Advanced Heart Failure Rounding Note   Subjective:    Dobutamine stopped 07/13/17. Co-ox stable today at 80.7%.   Creatinine up slightly but stable at 2.54.  Feeling better. Denies SOB or lightheadedness. Weight up 5 lbs over past 2 days.  Belly distended and a little sore.   Weight is back to admission weight despite net negative of 15 L this admit.   Echo 07/07/17 EF 20-25% mod Kelly   Objective:   Weight Range:  Vital Signs:   Temp:  [97.4 F (36.3 C)-98.5 F (36.9 C)] 97.7 F (36.5 C) (11/06 0525) Pulse Rate:  [100-110] 103 (11/06 0525) Resp:  [9-27] 10 (11/06 0620) BP: (109-136)/(82-91) 114/91 (11/06 0525) SpO2:  [93 %-100 %] 100 % (11/06 0525) Weight:  [139 lb 9.6 oz (63.3 kg)] 139 lb 9.6 oz (63.3 kg) (11/06 0545) Last BM Date: 07/14/17  Weight change: Filed Weights   07/13/17 0400 07/14/17 0600 07/15/17 0545  Weight: 134 lb 11.2 oz (61.1 kg) 137 lb 2 oz (62.2 kg) 139 lb 9.6 oz (63.3 kg)   Intake/Output:   Intake/Output Summary (Last 24 hours) at 07/15/2017 16100712 Last data filed at 07/15/2017 0500 Gross per 24 hour  Intake 720 ml  Output 1900 ml  Net -1180 ml    Physical Exam: General: Fatigued appearing. No resp difficulty. HEENT: Normal Neck: Supple. JVP 8-9 cm. Carotids 2+ bilat; no bruits. No thyromegaly or nodule noted. Cor: PMI lateral. Regular, slightly tachy. + S3.  Lungs: CTAB, normal effort. Abdomen: Soft, non-tender, non-distended, no HSM. No bruits or masses. +BS  Extremities: No cyanosis, clubbing, or rash. R and LLE no edema.  Neuro: Alert & orientedx3, cranial nerves grossly intact. moves all 4 extremities w/o difficulty. Affect pleasant   Telemetry: ST 100s, Personally reviewed.   Labs: Basic Metabolic Panel: Recent Labs  Lab 07/11/17 0436 07/12/17 0425 07/13/17 0351 07/14/17 0537 07/15/17 0511  NA 134* 135 136 132* 134*  K 3.9 4.0 4.2 4.5 4.6  CL 93* 93* 98* 97* 99*  CO2 31 33* 32 30 28  GLUCOSE 114* 125* 90 97 92  BUN 52*  51* 46* 46* 55*  CREATININE 2.48* 2.46* 2.62* 2.54* 2.54*  CALCIUM 8.2* 8.4* 8.2* 8.2* 8.3*  MG  --   --   --   --  2.1   Liver Function Tests: Recent Labs  Lab 07/15/17 0511  AST 65*  ALT 50  ALKPHOS 81  BILITOT 0.6  PROT 5.9*  ALBUMIN 2.7*   No results for input(s): LIPASE, AMYLASE in the last 168 hours. No results for input(s): AMMONIA in the last 168 hours.  CBC: Recent Labs  Lab 07/10/17 0447 07/11/17 0436 07/12/17 0425 07/13/17 0351 07/14/17 0537  WBC 10.3 13.2* 13.3* 8.2 9.6  HGB 11.4* 11.4* 11.2* 10.5* 11.1*  HCT 35.8* 35.8* 36.0* 34.7* 34.9*  MCV 76.5* 75.8* 76.6* 78.3 77.0*  PLT 290 277 298 250 284   Cardiac Enzymes: No results for input(s): CKTOTAL, CKMB, CKMBINDEX, TROPONINI in the last 168 hours.  BNP: BNP (last 3 results) Recent Labs    05/29/17 2152 06/05/17 1537 07/04/17 1932  BNP 3,608.0* 4,465.0* 3,665.0*   ProBNP (last 3 results) No results for input(s): PROBNP in the last 8760 hours.  Other results:  Imaging: No results found.   Medications:     Scheduled Medications: . allopurinol  100 mg Oral Daily  . ALPRAZolam  0.5 mg Oral TID  . bumetanide  2 mg Oral BID  . busPIRone  5 mg Oral TID  . Chlorhexidine Gluconate Cloth  6 each Topical Daily  . colchicine  0.6 mg Oral Daily  . gabapentin  300 mg Oral BID  . isosorbide-hydrALAZINE  2 tablet Oral TID  . levofloxacin  250 mg Oral Daily  . potassium chloride  20 mEq Oral Daily  . sodium chloride flush  3 mL Intravenous Q12H    Infusions: . sodium chloride    . sodium chloride 10 mL/hr at 07/13/17 0800  . dexmedetomidine (PRECEDEX) IV infusion Stopped (07/09/17 0830)    PRN Medications: sodium chloride, acetaminophen, alum & mag hydroxide-simeth, magnesium hydroxide, ondansetron (ZOFRAN) IV, sodium chloride flush   Assessment:   Kelly Leonard is a 61 year old with a history of NICM, CKD Stage III, HTN, anxiety, ICD, and chronic systolic heart failure.  Admitted with A/C  systolic heart failure.    Plan/Discussion:    1. A/C Systolic Heart Failure  -> cardiogenic shock - NICM. ECHO EF 05/2017 EF 20-25%.  - Dobutamine stopped 07/13/17. Coox stable this am at 80.7%.   - On bidil 2 tabs tid.   - CVP disconnected.  - Volume status trending up. Give IV lasix 80 mg x 1, then transition back to bumex 3 mg BID (His previous home dose).  - Pressures stable but no good route for medications titration with low output and CKD. Could consider corlanor but not on any other goal medications.  - No Spiro/ACE/ARB/ARNI with CKD IV.  - No BB yet with low output.  - Likely not candidate for advanced therapies due to severe CKD and social situation/severe anxiety/noncomplaince - Creatinine stable at 2.54. Creatinine has been ~2.5-3.0 - Remove cordis today.  2. Hyponatremia - Sodium 134 this am.  3. AKI on CKD Stage IV - Creatinine baseline 2.8-3.0 in setting of PCKD and HF - Creatinine stable at 2.54. - Likely component of cardiorenal syndrome - Renal US completed- Polycystic  kidney disease. 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 Will need ID f/u at d/c.   HIV--> NR  4.  Anxiety  - Severe. Psychiatry consult appreciated.  - Improved on current meds. Will continue.  5. UTI - UA with large amount of leukocytes. Urine culture - No growth. On levaquin. Will give 7 day course due to complicated UTI. Day 5/7 6. Acute Gout  - Treated with prednisone an colchicine. Now on allopurinol.  - No change.   Will give IV lasix and follow response. Back to po this evening. Likely home today.   Length of Stay: [address]  Kelly Leonard, Kelly Leonard  07/15/2017, 7:12 AM   Advanced Heart Failure Team Pager [phone] (M-F; 7a - 4p)  Please contact [facility] for night-coverage after hours (4p -7a ) and weekends on [email or web address]  Patient seen and examined with the above-signed Advanced Practice Provider and/or Housestaff. I personally reviewed  laboratory data, imaging studies and relevant notes. I independently examined the patient and formulated the important aspects of the plan. I have edited the note to reflect any of my changes or salient points. I have personally discussed the plan with the patient and/or family.  He is stable today. Feels good. Volume status up slightly but not too far from baseline. Ok for d/c today on current medicines with close f/u in HF Clinic and with Renal. Long discussion regarding emd education and fluid restriction.   Arvilla Meres, MD  9:02 AM

## 2017-07-15 NOTE — Progress Notes (Signed)
Removed all IV's, went over AVS paperwork and where to go for prescriptions.  Patients wife at bedside.  Both verbally expressed understanding of hospital summary.  Checked with patient that all belongings are present.

## 2017-07-15 NOTE — Discharge Summary (Signed)
Advanced Heart Failure Discharge Note  Discharge Summary   Patient ID: Kelly Leonard MRN: [id number], DOB/AGE: 60/20/1957 60 y.o. Admit date: 07/07/2017 D/C date:     07/15/2017   Primary Discharge Diagnoses:  1. A/C Systolic Heart Failure  -> cardiogenic shock 2. Hyponatremia 3. AKI on CKD Stage IV 4. Anxiety  5. UTI 6. Acute Gout  7. Hepatitis C  Hospital Course:   Kelly Leonard is a 61 y.o. male with h/o NICM, CKD stage III, HTN, anxiety, ICD, and chronic systolic heart failure.    Pt admitted 10/29 with SOB with any exertion and fatigue. Baseline creatinine noted to be 2.8 - 3.0 so nephrology consulted.   Swan placed 07/07/17 and started on dobutamine with low cardiac output with initial mixed venous sat of 41%. Pt noted be very anxious and psych consulted for recommendation. IV diuresis continued and progress followed on Swan.   Brisk diuresis noted overnight into 07/09/17. Lasix stopped with 10 lb weight loss and CVP 2. Bidil started.   Pt started on po diuretics 11/1 and continued to titrate HF medications as tolerated. Limited due to allergies and advanced CKD.   Pt tolerated dobutamine wean with marginal coox, that improved on day of discharge. Dobutamine stopped since 07/13/17. Pt transitioned back to oral bumex with poor response to po lasix. He fared better on this but fluid status still climbed slightly.  Given dose of IV lasix and bumex increased to PTA dose of 3 mg BID prior to discharge.      Hospital course complicated by Hepatitis C diagnosis. Will need ID follow up as outpatient. Pt had acute gout flare this admit that resolved with prednisone and colchicine. Discharged on allopurinol.   Pt also completed course of levaquin for UA with large leukocytes and + UTI. UCx negative.   Pt diuresed 15.8 L but weight the same as admission on discharge at 139 lbs.   Pt will be discharged to home today in stable, but tenuous, condition with close follow up in the HF  clinic as below.   Discharge Weight Range: 139 lbs Discharge Vitals: Blood pressure 118/83, pulse (!) 103, temperature 98.8 F (37.1 C), temperature source Oral, resp. rate 12, height 5\' 9"  (1.753 m), weight 139 lb 9.6 oz (63.3 kg), SpO2 100 %.  Labs: Lab Results  Component Value Date   WBC 9.6 07/14/2017   HGB 11.1 (L) 07/14/2017   HCT 34.9 (L) 07/14/2017   MCV 77.0 (L) 07/14/2017   PLT 284 07/14/2017    Recent Labs  Lab 07/15/17 0511  NA 134*  K 4.6  CL 99*  CO2 28  BUN 55*  CREATININE 2.54*  CALCIUM 8.3*  PROT 5.9*  BILITOT 0.6  ALKPHOS 81  ALT 50  AST 65*  GLUCOSE 92   Lab Results  Component Value Date   CHOL 201 (H) 02/12/2017   HDL 63 02/12/2017   LDLCALC 120 (H) 02/12/2017   TRIG 88 02/12/2017   BNP (last 3 results) Recent Labs    05/29/17 2152 06/05/17 1537 07/04/17 1932  BNP 3,608.0* 4,465.0* 3,665.0*    ProBNP (last 3 results) No results for input(s): PROBNP in the last 8760 hours.   Diagnostic Studies/Procedures   No results found.  Discharge Medications   Allergies as of 07/15/2017      Reactions   Entresto [sacubitril-valsartan] Swelling   Swelling of the face   Penicillins Anaphylaxis, Swelling   Has patient had a PCN reaction causing immediate rash, facial/tongue/throat swelling, SOB  or lightheadedness with hypotension: Yes Has patient had a PCN reaction causing severe rash involving mucus membranes or skin necrosis: No Has patient had a PCN reaction that required hospitalization: No Has patient had a PCN reaction occurring within the last 10 years: No If all of the above answers are "NO", then may proceed with Cephalosporin use.   Seroquel [quetiapine] Nausea And Vomiting, Hives   Hives   Ace Inhibitors Rash   Carvedilol Other (See Comments)   "dizziness, light headed, and nausea"   Hydroxyzine Nausea Only   Sertraline Diarrhea   itching   Trazodone And Nefazodone Other (See Comments)   "up for days"   Lorazepam Rash   Rash  and severe anxiety   Torsemide Hives, Itching, Nausea And Vomiting, Rash      Medication List    STOP taking these medications   carvedilol 6.25 MG tablet Commonly known as:  COREG   ENTRESTO 24-26 MG Generic drug:  sacubitril-valsartan   furosemide 40 MG tablet Commonly known as:  LASIX   metolazone 2.5 MG tablet Commonly known as:  ZAROXOLYN   metoprolol succinate 25 MG 24 hr tablet Commonly known as:  TOPROL-XL   QUEtiapine 50 MG tablet Commonly known as:  SEROQUEL   valsartan 160 MG tablet Commonly known as:  DIOVAN     TAKE these medications   allopurinol 100 MG tablet Commonly known as:  ZYLOPRIM Take 1 tablet (100 mg total) daily by mouth. Start taking on:  07/16/2017   aspirin EC 81 MG tablet Take 81 mg by mouth daily.   b complex vitamins tablet Take 1 tablet by mouth daily.   bumetanide 1 MG tablet Commonly known as:  BUMEX Take 3 tablets (3 mg total) 2 (two) times daily by mouth. What changed:  See the new instructions.   busPIRone 5 MG tablet Commonly known as:  BUSPAR Take 1 tablet (5 mg total) 3 (three) times daily by mouth.   gabapentin 300 MG capsule Commonly known as:  NEURONTIN Take 1 capsule (300 mg total) 2 (two) times daily by mouth.   isosorbide-hydrALAZINE 20-37.5 MG tablet Commonly known as:  BIDIL Take 2 tablets 3 (three) times daily by mouth.   Melatonin 10 MG Caps Take 10 mg by mouth at bedtime.   MULTI-VITAMINS Tabs Take by mouth.   multivitamin with minerals tablet Take 1 tablet by mouth daily.   potassium chloride SA 20 MEQ tablet Commonly known as:  K-DUR,KLOR-CON Take 1 tablet (20 mEq total) daily by mouth. Start taking on:  07/16/2017   sertraline 25 MG tablet Commonly known as:  ZOLOFT Take by mouth.   traZODone 50 MG tablet Commonly known as:  DESYREL Take 50 mg by mouth at bedtime as needed. for sleep   Vitamin D3 1000 units Caps Take by mouth.   zolpidem 5 MG tablet Commonly known as:  AMBIEN TAKE  1 TABLET BY MOUTH AT BEDTIME AS NEEDED SLEEP       Disposition   The patient will be discharged in stable condition to home. Discharge Instructions    (HEART FAILURE PATIENTS) Call MD:  Anytime you have any of the following symptoms: 1) 3 pound weight gain in 24 hours or 5 pounds in 1 week 2) shortness of breath, with or without a dry hacking cough 3) swelling in the hands, feet or stomach 4) if you have to sleep on extra pillows at night in order to breathe.   Complete by:  As directed  Diet - low sodium heart healthy   Complete by:  As directed    Heart Failure patients record your daily weight using the same scale at the same time of day   Complete by:  As directed    Increase activity slowly   Complete by:  As directed    STOP any activity that causes chest pain, shortness of breath, dizziness, sweating, or exessive weakness   Complete by:  As directed      Follow-up Information    [facility] Follow up on 07/22/2017.   Specialty:  Cardiology Why:  at 1030 for post hospital follow up. Please bring all of your medicatoins to your visit. The code for parking is 8001. Psychologist, sport and exercise thru Holiday representative off of [address]. Underground parking on your right. Can also park in Lower ED lot and enter blue awning.  Contact information: [address] [id number] mc [place] [address] [phone]            Duration of Discharge Encounter: Greater than 35 minutes   Signed, Jahsir, Kobashigawa 07/15/2017, 3:43 PM   Patient seen and examined with the above-signed Advanced Practice Provider and/or Housestaff. I personally reviewed laboratory data, imaging studies and relevant notes. I independently examined the patient and formulated the important aspects of the plan. I have edited the note to reflect any of my changes or salient points. I have personally discussed the plan with the patient and/or family.  He is much  improved. Will follow closely in HF Clinic. Likely not candidate for advanced therapies due to CKD.  Arvilla Meres, MD  10:07 PM

## 2017-07-15 NOTE — Progress Notes (Signed)
Heart Failure Navigator Consult Note  Presentation: Per Dr. Kevan Rosebush Clum is a 61 year old with a history of severe systolic HF EF 20% due to NICM (normal coronaries on cath 03/09/13) s/p ICD, CKD Stage IV (baseline creatinine 2.8-3.0), HTN and anxiety.   Reports a long h/o CHF but can't tell me who his heart doctor is. In CareEverywhere it seems as if he has been seen by [facility] and [facility]. Has also followed with [facility] with Dr. Glenna Fellows.   Recently admitted October 16th at [facility] with A/C systolic heart failure. Discharged on bumex 3 mg twice a day. D/C weight 124 pounds. Says he cannot take a lot of HF meds due to allergies.   In October he was evaluated ED x4 with insomnia, hyponatremia and  confusion.   Admitted to [facility] October 27th with weakness and cramps and Class IV HF symptoms. Sodium on admit was 123. Treated with NS IV fluids. Diuretics held. Creatinine has been trending up 2.8>4.14. Unable to diurese. Transferred to [facility] today for advanced heart failure care.   Complaining of fatigue. SOB with any exertion. Denies CP. 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 Denies ETOH, drugs or cigarette use.  Past Medical History:  Diagnosis Date  . Anxiety   . CHF (congestive heart failure) (HCC)   . Chronic systolic congestive heart failure (HCC)   . CKD (chronic kidney disease) stage 4, GFR 15-29 ml/min (HCC)   . COPD (chronic obstructive pulmonary disease) (HCC)   . Dilated cardiomyopathy (HCC)   . Hyperlipidemia   . Hypertension   . MI (myocardial infarction) (HCC)   . Polycystic kidney   . Renal disorder     Social History   Socioeconomic History  . Marital status: Single    Spouse name: None  . Number of children: None  . Years of education: None  . Highest education level: None  Social Needs  . Financial resource strain: None  . Food insecurity - worry: None  . Food insecurity - inability: None  .  Transportation needs - medical: None  . Transportation needs - non-medical: None  Occupational History  . None  Tobacco Use  . Smoking status: Never Smoker  . Smokeless tobacco: Never Used  Substance and Sexual Activity  . Alcohol use: No  . Drug use: No  . Sexual activity: Yes  Other Topics Concern  . None  Social History Narrative  . None    ECHO:Study Conclusions-07/07/17  - Left ventricle: Systolic function was severely reduced. The estimated ejection fraction was in the range of 20% to 25%.   Diffuse hypokinesis. - Mitral valve: There was moderate to severe regurgitation. - Left atrium: The atrium was moderately dilated. - Right atrium: The atrium was mildly dilated. - Atrial septum: No defect or patent foramen ovale was identified. - Pulmonary arteries: PA peak pressure: 50 mm Hg (S).  ------------------------------------------------------------------- Study data:  Comparison was made to the study of 05/13/2017.  Study status:  Routine.  Procedure:  The patient reported no pain pre or post test. Transthoracic echocardiography. Image quality was adequate.  Study completion:  There were no complications. Transthoracic echocardiography.  M-mode, complete 2D, spectral Doppler, and color Doppler.  Birthdate:  Patient birthdate: January 05, 1956.  Age:  Patient is 61 yr old.  Sex:  Gender: male. BMI: 20.6 kg/m^2.  Blood pressure:     131/113  Patient status: Inpatient.  Study date:  Study date: 07/07/2017. Study  time: 03:43 PM.  Location:  Bedside.   BNP    Component Value Date/Time   BNP 3,665.0 (H) 07/04/2017 1932    ProBNP No results found for: PROBNP   Education Assessment and Provision:  Detailed education and instructions provided on heart failure disease management including the following:  Signs and symptoms of Heart Failure When to call the physician Importance of daily weights Low sodium diet Fluid restriction Medication management Anticipated future  follow-up appointments  Patient education given on each of the above topics.  Patient acknowledges understanding and acceptance of all instructions.  I spoke with Mr. Karl ItoMcCollum about his HF and current hospitalization.  He tells me that he has been hospitalized several times recently and has only worked 3 days in the past month due to illness.  He does say that he has a scale provided by [employer].  We discussed the importance of daily weights and when to contact the physician.  I also reviewed a low sodium diet and high sodium foods to avoid.  He admits that he and his wife eat out often at "[employer]".  He says that it is mostly low sodium choices.  He denies any issues getting or taking prescribed medications.  He will follow in the AHF clinic after discharge.  I have given him a map and reviewed directions.    Education Materials:  "Living Better With Heart Failure" Booklet, Daily Weight Tracker Tool    High Risk Criteria for Readmission and/or Poor Patient Outcomes:  (Recommend Follow-up with Advanced Heart Failure Clinic)   EF <30%- Yes 20-25%  2 or more admissions in 6 months- Yes 4 in 6 mo  Difficult social situation- denies  Demonstrates medication noncompliance- denies    Barriers of Care:  Knowledge and compliance  Discharge Planning:   [place] in [place]  Patient would greatly benefit from and I will submit a referral to [facility] for ongoing education, symptom recognition, and compliance reinforcement.

## 2017-07-16 ENCOUNTER — Telehealth: Payer: Self-pay | Admitting: Unknown Physician Specialty

## 2017-07-16 ENCOUNTER — Other Ambulatory Visit: Payer: Self-pay

## 2017-07-16 ENCOUNTER — Telehealth: Payer: Self-pay | Admitting: Cardiovascular Disease

## 2017-07-16 MED ORDER — ISOSORB DINITRATE-HYDRALAZINE 20-37.5 MG PO TABS: 2.0000 | ORAL_TABLET | Freq: Three times a day (TID) | ORAL | 6 refills | Status: DC

## 2017-07-16 MED ORDER — TRAZODONE HCL 50 MG PO TABS
50.0000 mg | ORAL_TABLET | Freq: Every evening | ORAL | 3 refills | Status: DC | PRN
Start: 2017-07-16 — End: 2017-07-29

## 2017-07-16 MED ORDER — ZOLPIDEM TARTRATE 5 MG PO TABS: 5.0000 mg | ORAL_TABLET | Freq: Every evening | ORAL | 2 refills | Status: DC | PRN

## 2017-07-16 MED ORDER — SERTRALINE HCL 25 MG PO TABS: 25.0000 mg | ORAL_TABLET | Freq: Every day | ORAL | 1 refills | Status: DC

## 2017-07-16 MED ORDER — BUMETANIDE 1 MG PO TABS: 3.0000 mg | ORAL_TABLET | Freq: Two times a day (BID) | ORAL | 3 refills | Status: DC

## 2017-07-16 NOTE — Telephone Encounter (Signed)
It doesn't come in those doses seperately.  Please clarify for Dr. Mariah Milling what he wanted

## 2017-07-16 NOTE — Telephone Encounter (Signed)
Patient was recently released from the hospital and requested refills on the marked medications to be sent to [facility].

## 2017-07-16 NOTE — Telephone Encounter (Signed)
Called and spoke to the receptionist at Dr. Windell Hummingbird office. I explained to her what was going on with the patient's medication. She states that Dr. Mariah Milling is not in the office today and neither is his nurse. Receptionist states that she is going to send a message to Dr. Windell Hummingbird nurse for them to call me back in the morning. Will call patient's wife to let her know.

## 2017-07-16 NOTE — Telephone Encounter (Signed)
This encounter was created in error - please disregard.

## 2017-07-16 NOTE — Telephone Encounter (Signed)
Called and left patient's wife a VM letting her know what was going on with the patient's medication. I explained that I am waiting on a call back from Dr. Windell Hummingbird office to see how he wants to proceed since this medication does not break down into the dosages that it was written for. Also informed them that the [employer] has been faxed in for him.

## 2017-07-16 NOTE — Telephone Encounter (Signed)
Pt c/o medication issue:  1. Name of Medication: bidil   2. How are you currently taking this medication (dosage and times per day)? 20-37.5 mg po 2 tablets TID   3. Are you having a reaction (difficulty breathing--STAT)? No   4. What is your medication issue? Insurance will not cover combo medication please call to discuss alternate

## 2017-07-16 NOTE — Telephone Encounter (Signed)
Pt called regarding his missing RX. [facility] are [facility] out of the 4 RX the pt needs  but they need the Ambien sent in and his insurance will not pay for the Bidil. They will pay for if the 2 active ingredients are ordered (isosorbide and hydralazine) separately. Pt states he is anxious and this is affecting his health.

## 2017-07-16 NOTE — Telephone Encounter (Signed)
Copied from CRM [id number]. Topic: Quick Communication - See Telephone Encounter >> Jul 16, 2017 10:50 AM Rudi Coco, NT wrote: CRM for notification. See Telephone encounter for:   07/16/17. Pts. Wife called and said that husband just left from pharmacy and insurance will not cover med. (isosorbide-hydralazine)(BIDIL 20-37.5mg ) being that its together pt. Needs the med. Separated so med. [facility] be covered by insurance [facility] [address] [facility]  fax # [phone]  [place]

## 2017-07-17 ENCOUNTER — Other Ambulatory Visit: Payer: Self-pay

## 2017-07-17 ENCOUNTER — Telehealth: Payer: Self-pay | Admitting: Unknown Physician Specialty

## 2017-07-17 ENCOUNTER — Telehealth: Payer: Self-pay

## 2017-07-17 MED ORDER — GABAPENTIN 300 MG PO CAPS: 300.0000 mg | ORAL_CAPSULE | Freq: Two times a day (BID) | ORAL | 1 refills | Status: DC

## 2017-07-17 MED ORDER — HYDRALAZINE HCL 25 MG PO TABS: 75.0000 mg | ORAL_TABLET | Freq: Three times a day (TID) | ORAL | 3 refills | Status: DC

## 2017-07-17 MED ORDER — ISOSORBIDE DINITRATE 40 MG PO TABS: 40.0000 mg | ORAL_TABLET | Freq: Three times a day (TID) | ORAL | 3 refills | Status: DC

## 2017-07-17 NOTE — Telephone Encounter (Signed)
Spoke with [employer] need these two meds split apart. isosorvide 20mg  and hygrafavine 25 mg

## 2017-07-17 NOTE — Telephone Encounter (Signed)
This would be isosorbide dinitrate 20 mg 3 times a day Hydralazine 25 mg 3 times a day

## 2017-07-17 NOTE — Telephone Encounter (Signed)
Jasmine December, one of the nurses with [facility] returned my call. I explained to her about the combo medication not being covered by the patient's insurance and was needing it to be split to two medications. Jasmine December states she was going to speak with Dr. Mariah Milling and see what he wants to do about this medication and then call me back.

## 2017-07-17 NOTE — Telephone Encounter (Signed)
Spoke with [employer] need these two meds split apart. isosorvide 20mg and hygrafavine 25 mg °

## 2017-07-17 NOTE — Telephone Encounter (Signed)
Medication split and sent to his pharmacy.

## 2017-07-17 NOTE — Telephone Encounter (Signed)
Called Dr. Windell Hummingbird office to follow up about patient's medication. I spoke with the receptionist again and she states that Dr. Mariah Milling has the message from when I called yesterday afternoon but has not had a chance to look at it because he is in clinic. She states that they will call me back when he has a chance to look at the message.

## 2017-07-17 NOTE — Telephone Encounter (Signed)
Medication split by Dr.Johnson, sent to the pharmacy.

## 2017-07-17 NOTE — Telephone Encounter (Signed)
Medication slit by Dr.Johnson and sent to his pharmacy.

## 2017-07-17 NOTE — Addendum Note (Signed)
Addended by: Dorcas Carrow on: 07/17/2017 04:54 PM   Modules accepted: Orders

## 2017-07-17 NOTE — Telephone Encounter (Signed)
S/w Wilhemena Durie, CMA, at [facility]. Pt prescribed bidil by Gabriel Cirri, CFNP but it is cost prohibitive. Wants to send prescriptions for isosorbide and hydralazine instead but would Dr. Windell Hummingbird advice regarding dosage of each. Routed to MD.

## 2017-07-17 NOTE — Telephone Encounter (Signed)
Called again by nurse at the his insurance company. Bidil not covered by his insurance. Needs it broken up. Same dose of medicine sent to his pharmacy- just broken up.

## 2017-07-17 NOTE — Telephone Encounter (Signed)
Rx sent to his pharmacy

## 2017-07-17 NOTE — Telephone Encounter (Signed)
Peggy,RN  From [employer] called on behalf of pt regarding current medications that were prescribed today. Pt reached out to the nurse due to being upset about not being able to get his prescriptions refilled.Prescription written for isosorbide dinitrate requires prior authorization according to the pharmacy before it is dispensed. If the prescription is written as isosorbide mononitrate it would not require the authorization. Peggy,RN from [facility] can be contacted at [phone].

## 2017-07-17 NOTE — Telephone Encounter (Signed)
Medication split by Dr.Johnson and sent to his pharmacy.

## 2017-07-17 NOTE — Telephone Encounter (Signed)
Copied from CRM [id number]. Topic: Quick Communication - See Telephone Encounter >> Jul 17, 2017  3:22 PM Cipriano Bunker wrote: CRM for notification. See Telephone encounter for:  He was prescribed Bidil when he had congested heart failure and insurance doesn't cover this. Please call in generic equalivant.  20mg  Isosorbide Dinitrate and 25 mg Hydralazine. He will need these today.   Please call Delorise Shiner from [facility] and follow up with her when [facility] [address] [facility] [place].   07/17/17.

## 2017-07-17 NOTE — Telephone Encounter (Signed)
[facility] requesting a 90 day RX of patient's gabapentin be sent in.

## 2017-07-18 ENCOUNTER — Encounter (HOSPITAL_COMMUNITY): Payer: Self-pay | Admitting: Pharmacist

## 2017-07-18 ENCOUNTER — Telehealth: Payer: Self-pay | Admitting: Cardiovascular Disease

## 2017-07-18 ENCOUNTER — Telehealth (HOSPITAL_COMMUNITY): Payer: Self-pay | Admitting: Surgery

## 2017-07-18 MED ORDER — ISOSORBIDE DINITRATE 20 MG PO TABS: 40.0000 mg | ORAL_TABLET | Freq: Three times a day (TID) | ORAL | 5 refills | Status: DC

## 2017-07-18 NOTE — Telephone Encounter (Signed)
Called and spoke with patient's wife. I apologized for this taking so long but explained that the patient's insurance is not wanting to cover the new prescription we sent in. I told the patient's wife that we are currently waiting to hear back from Dr. Windell Hummingbird office on how to proceed with the isosorbide prescription.

## 2017-07-18 NOTE — Telephone Encounter (Signed)
Kelly Leonard can we please change the patient's RX to isosorbide mononitrate please? Dr. Laural Benes split the patient's medications up yesterday and sent them in.

## 2017-07-18 NOTE — Telephone Encounter (Signed)
Called and spoke with Alycia Rossetti at the pharmacy. I called to make sure this dosage was going through the patient's insurance and he states that it is. He states that the patient was given 2 weeks of the bidil yesterday because he had a savings or coupon card. He states that he will explain to the patient to start this medication along with the hydralizine once he finishes the bidil. Will call patient's wife to let her know about this.

## 2017-07-18 NOTE — Telephone Encounter (Signed)
Called and spoke with Jasmine December, RN at Dr. Windell Hummingbird office. I explained to her that the patient insurance is not wanting to cover the isosorbide dinitrate and wants mononitrate instead. I told Jasmine December that Elnita Maxwell wants to know from Dr. Mariah Milling what we should do about this medication. Elnita Maxwell wants to know if it is Ok for the patient to take 120 mg/day of the isosorbide mononitrate. Jasmine December stated that she was going to catch up with Dr. Mariah Milling and get back to me about this. Will call the patient's wife in the meantime and let her know what is going on.

## 2017-07-18 NOTE — Telephone Encounter (Addendum)
Pt was prescribed Bidil 20/37.5mg  BID but insurance would not cover the medication. New prescriptions for isosorbide dinitrate 40mg  TID and hydralazine 25mg  TID then prescribed by PCP to replace Bidil. Received call from [place] at [facility] who reports insurance will not cover isosorbide dinitrate but will cover isosorbide mononitrate. Gabriel Cirri, NP, requests advice from Dr. Mariah Milling. Isosorbide dinitrate is used in combination with hydralazine to treat angina and CHF. Isosorbide mononitrate is not. Pt has documented CHF w/reported EF 20-25% per 10/29 echo. I s[facility]w [facility] Pt's insurance requires prior authorization for isosorbide dinitrate 40mg  tablets; it does not come in a generic form. Isosorbide dinitrate 20mg  is less expensive. Suggests could order isosorbide dinitrate 20mg  tablets, take 2 tablets TID. I reviewed pharmacy recommendations w/Brittany at [facility]. She is also able to see note in Epic.

## 2017-07-18 NOTE — Telephone Encounter (Signed)
I called [name] regarding phone calls documented in Epic.  Apparently he has had issue getting his prescribed BiDil.  I spoke with he and his wife and have advised him to continue his BiDil until his follow-up appt in the AHF Clinic.  I have forwarded his chart to [facility] Pilling [facility] Pharm D and she had begun the appeal process for the BiDil with his insurance provider [employer].  All of this information was shared with the patient and he is in agreement.  He has a scheduled appt with the AHF Clinic on 11/13 at 1030.  He tells me that he is doing well, has continued to lose weight and he feels as though "fluid is gone".  I encouraged him to call me with any other concerns or questions regarding his HF.

## 2017-07-18 NOTE — Telephone Encounter (Signed)
Kelly Leonard, please see telephone note in patient's chart from Dr. Windell Hummingbird office. Jasmine December, RN called the patient's pharmacy and spoke with Alycia Rossetti there. She states that the isosorbide dinitrate is needing a PA and does not come in generic. Jasmine December stated that Alycia Rossetti said that even if PA is approved, the medication still may be too expensive for the patient based on his experience with it. Isosorbide dinitrate 20 mg does come in generic and may be better to try to prescribe. Jasmine December states that we could do isosorbide dinitrate 20 mg, 2 tablets TID and hopefully that will work with the [employer].

## 2017-07-18 NOTE — Telephone Encounter (Signed)
I called Mr. Ainsworth and

## 2017-07-18 NOTE — Telephone Encounter (Signed)
Changing to [facility] makes it a different medication.  We really need the guidance from cardiology or ask please if they would write the medication or if it is OK to take 120 mg/day

## 2017-07-18 NOTE — Telephone Encounter (Signed)
Called and spoke to patient's wife. I let her know that the patient's medication was changed and going through his insurance according to the pharmacy. She states that she is aware because [employer] called her. I apologized again to the patient's wife and she said everything was fine.

## 2017-07-18 NOTE — Addendum Note (Signed)
Addended by: Gabriel Cirri on: 07/18/2017 12:01 PM   Modules accepted: Orders

## 2017-07-21 ENCOUNTER — Telehealth (HOSPITAL_COMMUNITY): Payer: Self-pay

## 2017-07-21 NOTE — Telephone Encounter (Signed)
CHF Clinic appointment reminder call placed to patient for upcoming post-hospital follow up.  Does patient understand purpose of this appointment and where CHF Clinic is located? Yes  How is patient feeling? Well, does state he is having issues getting Bidil  Does patient have all of their medications since their recent discharge? Yes, but states getting Bidil has been difficult  Patient also reminded to take all medications as prescribed on the day of his/her appointment and to bring all medications to this appointment.  Advised to call our office for tardiness or cancellations/rescheduling needs.  Kelly Leonard, Kelly Leonard

## 2017-07-22 ENCOUNTER — Ambulatory Visit (HOSPITAL_COMMUNITY)
Admission: RE | Admit: 2017-07-22 | Discharge: 2017-07-22 | Disposition: A | Discharge status: Home / Self Care | Payer: BLUE CROSS/BLUE SHIELD | Source: Ambulatory Visit | Attending: Cardiology | Admitting: Cardiology

## 2017-07-22 ENCOUNTER — Encounter (HOSPITAL_COMMUNITY): Payer: Self-pay

## 2017-07-22 ENCOUNTER — Telehealth (HOSPITAL_COMMUNITY): Payer: Self-pay | Admitting: Cardiology

## 2017-07-22 VITALS — BP 118/86 | HR 95 | Wt 144.0 lb

## 2017-07-22 DIAGNOSIS — Z833 Family history of diabetes mellitus: Secondary | ICD-10-CM | POA: Diagnosis not present

## 2017-07-22 DIAGNOSIS — B182 Chronic viral hepatitis C: Secondary | ICD-10-CM | POA: Diagnosis not present

## 2017-07-22 DIAGNOSIS — E785 Hyperlipidemia, unspecified: Secondary | ICD-10-CM | POA: Diagnosis not present

## 2017-07-22 DIAGNOSIS — J449 Chronic obstructive pulmonary disease, unspecified: Secondary | ICD-10-CM | POA: Insufficient documentation

## 2017-07-22 DIAGNOSIS — I5022 Chronic systolic (congestive) heart failure: Secondary | ICD-10-CM | POA: Diagnosis not present

## 2017-07-22 DIAGNOSIS — Z79899 Other long term (current) drug therapy: Secondary | ICD-10-CM | POA: Insufficient documentation

## 2017-07-22 DIAGNOSIS — F419 Anxiety disorder, unspecified: Secondary | ICD-10-CM | POA: Insufficient documentation

## 2017-07-22 DIAGNOSIS — I252 Old myocardial infarction: Secondary | ICD-10-CM | POA: Diagnosis not present

## 2017-07-22 DIAGNOSIS — Z87440 Personal history of urinary (tract) infections: Secondary | ICD-10-CM | POA: Diagnosis not present

## 2017-07-22 DIAGNOSIS — Q613 Polycystic kidney, unspecified: Secondary | ICD-10-CM | POA: Insufficient documentation

## 2017-07-22 DIAGNOSIS — I420 Dilated cardiomyopathy: Secondary | ICD-10-CM | POA: Diagnosis not present

## 2017-07-22 DIAGNOSIS — N184 Chronic kidney disease, stage 4 (severe): Secondary | ICD-10-CM | POA: Insufficient documentation

## 2017-07-22 DIAGNOSIS — Z880 Allergy status to penicillin: Secondary | ICD-10-CM | POA: Diagnosis not present

## 2017-07-22 DIAGNOSIS — Z7982 Long term (current) use of aspirin: Secondary | ICD-10-CM | POA: Insufficient documentation

## 2017-07-22 DIAGNOSIS — I130 Hypertensive heart and chronic kidney disease with heart failure and stage 1 through stage 4 chronic kidney disease, or unspecified chronic kidney disease: Secondary | ICD-10-CM | POA: Insufficient documentation

## 2017-07-22 DIAGNOSIS — N179 Acute kidney failure, unspecified: Secondary | ICD-10-CM | POA: Diagnosis not present

## 2017-07-22 DIAGNOSIS — Z888 Allergy status to other drugs, medicaments and biological substances status: Secondary | ICD-10-CM | POA: Insufficient documentation

## 2017-07-22 DIAGNOSIS — Z8249 Family history of ischemic heart disease and other diseases of the circulatory system: Secondary | ICD-10-CM | POA: Insufficient documentation

## 2017-07-22 DIAGNOSIS — Z95810 Presence of automatic (implantable) cardiac defibrillator: Secondary | ICD-10-CM | POA: Insufficient documentation

## 2017-07-22 LAB — BASIC METABOLIC PANEL
ANION GAP: 8 (ref 5–15)
BUN: 53 mg/dL — ABNORMAL HIGH (ref 6–20)
CALCIUM: 8.8 mg/dL — AB (ref 8.9–10.3)
CO2: 28 mmol/L (ref 22–32)
CREATININE: 2.95 mg/dL — AB (ref 0.61–1.24)
Chloride: 103 mmol/L (ref 101–111)
GFR calc non Af Amer: 22 mL/min — ABNORMAL LOW (ref >60)
GFR, EST AFRICAN AMERICAN: 25 mL/min — AB (ref >60)
Glucose, Bld: 102 mg/dL — ABNORMAL HIGH (ref 65–99)
Potassium: 5.5 mmol/L — ABNORMAL HIGH (ref 3.5–5.1)
SODIUM: 139 mmol/L (ref 135–145)

## 2017-07-22 MED ORDER — BUMETANIDE 1 MG PO TABS: 3.0000 mg | ORAL_TABLET | Freq: Every day | ORAL | 3 refills | Status: DC

## 2017-07-22 MED ORDER — IVABRADINE HCL 5 MG PO TABS: 2.5000 mg | ORAL_TABLET | Freq: Two times a day (BID) | ORAL | 3 refills | Status: DC

## 2017-07-22 NOTE — Patient Instructions (Signed)
Labs today (will call for abnormal results, otherwise no news is good news)  START Corlanor 2.5 mg (0.5 Tablet) Twice Daily.  You have been referred to Infectious Disease, they will contact you to schedule your initial appointment.   Follow up in 4 weeks.

## 2017-07-22 NOTE — Progress Notes (Signed)
Advanced Heart Failure Medication Review by a Pharmacist  Does the patient  feel that his/her medications are working for him/her?  yes  Has the patient been experiencing any side effects to the medications prescribed?  no  Does the patient measure his/her own blood pressure or blood glucose at home?  Not asked at this visit    Does the patient have any problems obtaining medications due to transportation or finances?   Yes - currently working on an appeal through [employer] for BiDil  Understanding of regimen: good Understanding of indications: good Potential of compliance: good Patient understands to avoid NSAIDs. Patient understands to avoid decongestants.  Issues to address at subsequent visits: None   Pharmacist comments: Kelly Leonard presents to clinic with his wife and a list of his current medications. He reports adherence to his regimen and took all of his morning doses today. He says he has been feeling really well and has no medication related questions or concerns at this time.    Time with patient: 10 Preparation and documentation time: 2 Total time: 12

## 2017-07-22 NOTE — Telephone Encounter (Signed)
clarification obtained DECREASE Bumex to 3 mg daily, can take extra in the evening as needed per VO Kelly Leonard Patient aware. Patient voiced understanding, repeat labs 10/16

## 2017-07-22 NOTE — Progress Notes (Signed)
Advanced Heart Failure Clinic Note  Primary Physician: Gabriel Cirri NP  Primary Cardiologist:  Horald Chestnut Nephrology: Dr Glenna Fellows  Primary HF: Dr. Gala Romney   HPI:  Kelly Leonard is a 61 y.o. 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    Patient admitted to [facility] in 10/18 with low output HF and AKI. Transferred to [facility] for further management.  On arrival to [facility], swan placed (07/07/17) which showed low output physiology.   RA = 12 PA =   48/22 (32) PCW = 14 Thermo cardiac output/index = 4.3/2.4 Fick CO/CI = 2.7/1.6 Ao sat = 96% PA sat = 41%  Started on dobutamine. HF meds optimized. Dobutamine stopped since 07/13/17. Creatinine peaked and 4.14 and improved to 2.5 with dobutamine  Hospital course complicated by severe anxiety and seen by Psych who adjusted meds with excellent improvement.  Hepatitis C diagnosis as well as UTI. Plan for ID follow up as outpatient. Pt completed course of levaquin for UTI. D/c weight 139.  Pt presents today for post hospital follow up. Says he feels great. The best he as felt in a long time. Doing all activities without limitation. No edema, orthopnea or PND. Anxiety much improved. Denies edema, orthopnea or PND. No dizziness  Review of systems complete and found to be negative unless listed in HPI.    Past Medical History:  Diagnosis Date  . Anxiety   . CHF (congestive heart failure) (HCC)   . Chronic systolic congestive heart failure (HCC)   . CKD (chronic kidney disease) stage 4, GFR 15-29 ml/min (HCC)   . COPD (chronic obstructive pulmonary disease) (HCC)   . Dilated cardiomyopathy (HCC)   . Hyperlipidemia   . Hypertension   . MI (myocardial infarction) (HCC)   . Polycystic kidney   . Renal disorder     Current Outpatient Medications  Medication Sig Dispense Refill  . allopurinol (ZYLOPRIM) 100 MG tablet Take 1 tablet (100 mg  total) daily by mouth. 30 tablet 3  . aspirin EC 81 MG tablet Take 81 mg by mouth daily.    Marland Kitchen b complex vitamins tablet Take 1 tablet by mouth daily.    . bumetanide (BUMEX) 1 MG tablet Take 3 tablets (3 mg total) 2 (two) times daily by mouth. 180 tablet 3  . busPIRone (BUSPAR) 5 MG tablet Take 1 tablet (5 mg total) 3 (three) times daily by mouth. 90 tablet 3  . Cholecalciferol (VITAMIN D3) 1000 units CAPS Take by mouth.    . gabapentin (NEURONTIN) 300 MG capsule Take 1 capsule (300 mg total) 2 (two) times daily by mouth. 180 capsule 1  . Melatonin 10 MG CAPS Take 10 mg by mouth at bedtime.    . Multiple Vitamin (MULTI-VITAMINS) TABS Take by mouth.    . Multiple Vitamins-Minerals (MULTIVITAMIN WITH MINERALS) tablet Take 1 tablet by mouth daily.    . potassium chloride SA (K-DUR,KLOR-CON) 20 MEQ tablet Take 1 tablet (20 mEq total) daily by mouth. 30 tablet 6  . sertraline (ZOLOFT) 25 MG tablet Take 1 tablet (25 mg total) daily by mouth. 90 tablet 1  . traZODone (DESYREL) 50 MG tablet Take 1 tablet (50 mg total) at bedtime as needed by mouth. for sleep 90 tablet 3  . zolpidem (AMBIEN) 5 MG tablet Take 1 tablet (5 mg total) at bedtime as needed by mouth for sleep. 30 tablet 2   No current facility-administered medications  for this encounter.    Allergies  Allergen Reactions  . Entresto [Sacubitril-Valsartan] Swelling    Swelling of the face  . Penicillins Anaphylaxis and Swelling    Has patient had a PCN reaction causing immediate rash, facial/tongue/throat swelling, SOB or lightheadedness with hypotension: Yes Has patient had a PCN reaction causing severe rash involving mucus membranes or skin necrosis: No Has patient had a PCN reaction that required hospitalization: No Has patient had a PCN reaction occurring within the last 10 years: No If all of the above answers are "NO", then may proceed with Cephalosporin use.   . Seroquel [Quetiapine] Nausea And Vomiting and Hives    Hives   . Ace  Inhibitors Rash  . Carvedilol Other (See Comments)    "dizziness, light headed, and nausea"  . Hydroxyzine Nausea Only  . Sertraline Diarrhea    itching  . Trazodone And Nefazodone Other (See Comments)    "up for days"  . Lorazepam Rash    Rash and severe anxiety  . Torsemide Hives, Itching, Nausea And Vomiting and Rash    Social History   Socioeconomic History  . Marital status: Single    Spouse name: Not on file  . Number of children: Not on file  . Years of education: Not on file  . Highest education level: Not on file  Social Needs  . Financial resource strain: Not on file  . Food insecurity - worry: Not on file  . Food insecurity - inability: Not on file  . Transportation needs - medical: Not on file  . Transportation needs - non-medical: Not on file  Occupational History  . Not on file  Tobacco Use  . Smoking status: Never Smoker  . Smokeless tobacco: Never Used  Substance and Sexual Activity  . Alcohol use: No  . Drug use: No  . Sexual activity: Yes  Other Topics Concern  . Not on file  Social History Narrative  . Not on file    Family History  Problem Relation Age of Onset  . Diabetes Mother   . Hypertension Brother   . Heart disease Maternal Grandmother   . Heart attack Maternal Grandmother    Vitals:   07/22/17 1043  BP: 118/86  Pulse: 95  SpO2: 99%  Weight: 144 lb (65.3 kg)    Wt Readings from Last 3 Encounters:  07/22/17 144 lb (65.3 kg)  07/15/17 139 lb 9.6 oz (63.3 kg)  07/07/17 139 lb 3.2 oz (63.1 kg)    PHYSICAL EXAM: General: Thin male.. No resp difficulty HEENT: normal Neck: supple. JVP flat . Carotids 2+ bilat; no bruits. No lymphadenopathy or thryomegaly appreciated. Cor: PMI laterally displaced. Tachy regular. +s3 Lungs: clear Abdomen: soft, nontender, nondistended. No hepatosplenomegaly. No bruits or masses. Good bowel sounds. Extremities: no cyanosis, clubbing, rash, edema Neuro: alert & orientedx3, cranial nerves grossly  intact. moves all 4 extremities w/o difficulty. Affect pleasant  ASSESSMENT & PLAN:  1. A/C Systolic Heart Failure - NICM. ECHO EF 05/2017 EF 20-25%.  - Improved since d/c. Now NYHA II-III - Volume status looks good. May even be a bit dry. - Continue bumex 3 mg BID - may need to cut back slightly pending labs - No Spiro/ACE/ARB/ARNI with CKD IV.  - No BB yet with low output.  - Start ivabradine 2.5 bid - Will need to consider adding Bidil soon - Poor candidate for advanced therapies due to severe CKD and social situation/severe anxiety/noncomplaince - Reinforced fluid restriction to < 2 L  daily, sodium restriction to less than 2000 mg daily, and the importance of daily weights.   2. CKD stage IV in setting of polycystic KD - Creatinine baseline 2.8-3.0 in setting of PCKD and HF - Check BMET today.  3. Anxiety  - Severe. Improved on current regimen. Will cotninue 4. HCV - Refer to ID  Arvilla Meres, MD 07/22/17

## 2017-07-22 NOTE — Telephone Encounter (Signed)
-----   Message from Graciella Freer, PA-C sent at 07/22/2017 11:47 AM EST ----- Per DB  Stop K.   Decrease Bumex to 60 mg daily.  Can take extra in the evening as needed.   Please recheck BMET this Friday.    Casimiro Needle [address]" [place], PA-C 07/22/2017 11:47 AM

## 2017-07-25 ENCOUNTER — Telehealth (HOSPITAL_COMMUNITY): Payer: Self-pay | Admitting: Cardiology

## 2017-07-25 ENCOUNTER — Ambulatory Visit (HOSPITAL_COMMUNITY)
Admission: RE | Admit: 2017-07-25 | Discharge: 2017-07-25 | Disposition: A | Discharge status: Home / Self Care | Payer: BLUE CROSS/BLUE SHIELD | Source: Ambulatory Visit | Attending: Internal Medicine | Admitting: Internal Medicine

## 2017-07-25 DIAGNOSIS — I5022 Chronic systolic (congestive) heart failure: Secondary | ICD-10-CM | POA: Insufficient documentation

## 2017-07-25 LAB — BASIC METABOLIC PANEL
ANION GAP: 8 (ref 5–15)
BUN: 48 mg/dL — ABNORMAL HIGH (ref 6–20)
CALCIUM: 8.7 mg/dL — AB (ref 8.9–10.3)
CHLORIDE: 103 mmol/L (ref 101–111)
CO2: 28 mmol/L (ref 22–32)
Creatinine, Ser: 3.23 mg/dL — ABNORMAL HIGH (ref 0.61–1.24)
GFR calc Af Amer: 22 mL/min — ABNORMAL LOW (ref >60)
GFR calc non Af Amer: 19 mL/min — ABNORMAL LOW (ref >60)
Glucose, Bld: 99 mg/dL (ref 65–99)
POTASSIUM: 4.9 mmol/L (ref 3.5–5.1)
Sodium: 139 mmol/L (ref 135–145)

## 2017-07-25 NOTE — Telephone Encounter (Signed)
Patient reports weight today is 136 b/p 122/87, HR 97 Repeat labs 11/20

## 2017-07-25 NOTE — Telephone Encounter (Signed)
-----   Message from Graciella Freer, PA-C sent at 07/25/2017 11:47 AM EST ----- Creatinine slightly worse but K better.   Please call and check on weights. Needs repeat BMET next week before thanksgiving.    Casimiro Needle [address] [place], PA-C 07/25/2017 11:47 AM

## 2017-07-28 ENCOUNTER — Telehealth (HOSPITAL_COMMUNITY): Payer: Self-pay | Admitting: Pharmacist

## 2017-07-28 NOTE — Telephone Encounter (Signed)
Bidil appeal approved by [employer] through 09/08/38.   Tyler Deis. Bonnye Fava, PharmD, BCPS, CPP Clinical Pharmacist Pager: [phone] Phone: [phone] 07/28/2017 9:51 AM

## 2017-07-29 ENCOUNTER — Ambulatory Visit (HOSPITAL_COMMUNITY)
Admission: RE | Admit: 2017-07-29 | Discharge: 2017-07-29 | Disposition: A | Discharge status: Home / Self Care | Payer: BLUE CROSS/BLUE SHIELD | Source: Ambulatory Visit | Attending: Internal Medicine | Admitting: Internal Medicine

## 2017-07-29 ENCOUNTER — Encounter (HOSPITAL_COMMUNITY): Payer: Self-pay | Admitting: *Deleted

## 2017-07-29 ENCOUNTER — Telehealth (HOSPITAL_COMMUNITY): Payer: Self-pay | Admitting: Surgery

## 2017-07-29 ENCOUNTER — Telehealth (HOSPITAL_COMMUNITY): Payer: Self-pay | Admitting: Pharmacist

## 2017-07-29 DIAGNOSIS — I5022 Chronic systolic (congestive) heart failure: Secondary | ICD-10-CM | POA: Insufficient documentation

## 2017-07-29 LAB — BASIC METABOLIC PANEL
ANION GAP: 10 (ref 5–15)
BUN: 44 mg/dL — AB (ref 6–20)
CHLORIDE: 102 mmol/L (ref 101–111)
CO2: 29 mmol/L (ref 22–32)
Calcium: 8.9 mg/dL (ref 8.9–10.3)
Creatinine, Ser: 2.95 mg/dL — ABNORMAL HIGH (ref 0.61–1.24)
GFR calc non Af Amer: 22 mL/min — ABNORMAL LOW (ref >60)
GFR, EST AFRICAN AMERICAN: 25 mL/min — AB (ref >60)
Glucose, Bld: 97 mg/dL (ref 65–99)
POTASSIUM: 4.5 mmol/L (ref 3.5–5.1)
Sodium: 141 mmol/L (ref 135–145)

## 2017-07-29 NOTE — Telephone Encounter (Signed)
Late entry:  I received an email from [employer] yesterday regarding Mr. Delancy and his possible adverse reaction to Corlanor.  I forwarded this email to [facility] Pharm D.  Please see her note for further details.

## 2017-07-29 NOTE — Telephone Encounter (Signed)
Received a message from [employer] (community paramedic from [facility]) who stated that soon after starting Corlanor, Mr. Szostak developed facial swelling that progressively worsened. After speaking with Mrs. Ciszewski today, she stated that after stopping the Corlanor the swelling subsided and has completely resolved now. She also stated that Mr. Veracruz did take PRN trazodone yesterday evening and developed lip swelling this morning which has happened in the past with trazodone. I have advised her to have Mr. Raybon d/c use of both the Corlanor and trazodone at this time. Of note, he had not been taking trazodone at the time he was taking Corlanor.   Tyler Deis. Bonnye Fava, PharmD, BCPS, CPP Clinical Pharmacist Pager: [phone] Phone: [phone] 07/29/2017 9:08 AM

## 2017-07-29 NOTE — Progress Notes (Signed)
Received disability forms from [place] disability on 07-14-2017.  Forms completed/signed and faxed today to [phone].  Original request will be scanned to patient's electronic medical record.

## 2017-08-05 ENCOUNTER — Telehealth (HOSPITAL_COMMUNITY): Payer: Self-pay | Admitting: Pharmacist

## 2017-08-05 NOTE — Telephone Encounter (Signed)
Mr. And Mrs. Leonard called stating that Kelly Leonard is now having lip and facial swelling after taking gabapentin for sleep. This has happened in the past with this medication but he wanted to retry it because it does help him sleep. He is not having any throat swelling or difficulty breathing but he cannot take Benadryl 2/2 it making him "crazy". I have recommended stopping the gabapentin for the time being and referred them to call Talmadge Coventry, FNP, office who prescribed the gabapentin for further instructions.   Tyler Deis. Bonnye Fava, PharmD, BCPS, CPP Clinical Pharmacist Pager: [phone] Phone: [phone] 08/05/2017 9:19 AM

## 2017-08-08 ENCOUNTER — Encounter: Payer: Self-pay | Admitting: Unknown Physician Specialty

## 2017-08-08 ENCOUNTER — Ambulatory Visit: Payer: BLUE CROSS/BLUE SHIELD | Admitting: Unknown Physician Specialty

## 2017-08-08 VITALS — BP 112/67 | HR 93 | Temp 97.9°F | Wt 147.0 lb

## 2017-08-08 DIAGNOSIS — I420 Dilated cardiomyopathy: Secondary | ICD-10-CM | POA: Diagnosis not present

## 2017-08-08 DIAGNOSIS — T783XXA Angioneurotic edema, initial encounter: Secondary | ICD-10-CM

## 2017-08-08 DIAGNOSIS — F419 Anxiety disorder, unspecified: Secondary | ICD-10-CM | POA: Diagnosis not present

## 2017-08-08 DIAGNOSIS — Q613 Polycystic kidney, unspecified: Secondary | ICD-10-CM

## 2017-08-08 DIAGNOSIS — F5101 Primary insomnia: Secondary | ICD-10-CM | POA: Diagnosis not present

## 2017-08-08 MED ORDER — PREGABALIN 50 MG PO CAPS: 50.0000 mg | ORAL_CAPSULE | Freq: Three times a day (TID) | ORAL | 2 refills | Status: DC

## 2017-08-08 NOTE — Assessment & Plan Note (Addendum)
Secondary to chronic illness.  Continue present medications except change Gabapentn

## 2017-08-08 NOTE — Assessment & Plan Note (Signed)
Difficult without Gabapentin with side-effects of angioedema.  Pharmacist suggested changing to Lyrica.  Will add Lyrical 50 mg. BID

## 2017-08-08 NOTE — Assessment & Plan Note (Signed)
Reviewed note.  Going to advanced heart failure clinic.  Reviewed labs and are stable.  Will not do additional   Labs here as getting frequent assessments.

## 2017-08-08 NOTE — Progress Notes (Signed)
BP 112/67   Pulse 93   Temp 97.9 F (36.6 C) (Oral)   Wt 147 lb (66.7 kg)   SpO2 97%   BMI 21.71 kg/m    Subjective:    Patient ID: Kelly Leonard, male    DOB: 03-04-1956, 61 y.o.   MRN: [id number]  HPI: Kelly Leonard is a 61 y.o. male  Chief Complaint  Patient presents with  . Follow-up    pt states he is just here to f/up   Heart Failure Pt was hospitalized multiple times for his heart failure.  Reviewed note from Heart failure clinic  and seems to be stable on current Bumex dose of 3 mg BID.  No swelling or SOB today  Anxiety/insomnia Doing well on present medications. Current regimine put on my psychiatry consult in the hospital.   States Gabapentin is causing his face to swell.  Was takin 300 mg twice a day.  Without it he cannot sleep  Chronic kidney disease Stage 4 secondary to polycystic kidney.  Sees Dr. Thedore MinsSingh. Last GFR is 22.    Relevant past medical, surgical, family and social history reviewed and updated as indicated. Interim medical history since our last visit reviewed. Allergies and medications reviewed and updated.  Review of Systems  Gastrointestinal: Negative.   Genitourinary: Negative.   Psychiatric/Behavioral: Negative.     Per HPI unless specifically indicated above     Objective:    BP 112/67   Pulse 93   Temp 97.9 F (36.6 C) (Oral)   Wt 147 lb (66.7 kg)   SpO2 97%   BMI 21.71 kg/m   Wt Readings from Last 3 Encounters:  08/08/17 147 lb (66.7 kg)  07/22/17 144 lb (65.3 kg)  07/15/17 139 lb 9.6 oz (63.3 kg)    Physical Exam  Constitutional: He is oriented to person, place, and time. He appears well-developed and well-nourished. No distress.  HENT:  Head: Normocephalic and atraumatic.  Eyes: Conjunctivae and lids are normal. Right eye exhibits no discharge. Left eye exhibits no discharge. No scleral icterus.  Neck: Normal range of motion. Neck supple. No JVD present. Carotid bruit is not present.  Cardiovascular: Normal rate,  regular rhythm and normal heart sounds.  Pulmonary/Chest: Effort normal and breath sounds normal. No respiratory distress.  Abdominal: Normal appearance. There is no splenomegaly or hepatomegaly.  Musculoskeletal: Normal range of motion.  Neurological: He is alert and oriented to person, place, and time.  Skin: Skin is warm, dry and intact. No rash noted. No pallor.  Psychiatric: He has a normal mood and affect. His behavior is normal. Judgment and thought content normal.    Results for orders placed or performed during the hospital encounter of 07/29/17  Basic metabolic panel  Result Value Ref Range   Sodium 141 135 - 145 mmol/L   Potassium 4.5 3.5 - 5.1 mmol/L   Chloride 102 101 - 111 mmol/L   CO2 29 22 - 32 mmol/L   Glucose, Bld 97 65 - 99 mg/dL   BUN 44 (H) 6 - 20 mg/dL   Creatinine, Ser 4.092.95 (H) 0.61 - 1.24 mg/dL   Calcium 8.9 8.9 - 81.110.3 mg/dL   GFR calc non Af Amer 22 (L) >60 mL/min   GFR calc Af Amer 25 (L) >60 mL/min   Anion gap 10 5 - 15      Assessment & Plan:   Problem List Items Addressed This Visit      Unprioritized   Anxiety disorder, unspecified  Secondary to chronic illness.  Continue present medications except change Gabapentn      Dilated cardiomyopathy (HCC)    Reviewed note.  Going to advanced heart failure clinic.  Reviewed labs and are stable.  Will not do additional   Labs here as getting frequent assessments.        Insomnia    Difficult without Gabapentin with side-effects of angioedema.  Pharmacist suggested changing to Lyrica.  Will add Lyrical 50 mg. BID      Polycystic kidney    Per Dr. Thedore Mins       Other Visit Diagnoses    Angioedema, initial encounter    -  Primary   Monitor response to Lyrica       Follow up plan: Return in about 4 weeks (around 09/05/2017) for For evaluation of response to Lyrica.

## 2017-08-08 NOTE — Assessment & Plan Note (Signed)
Per Dr. Singh 

## 2017-08-10 ENCOUNTER — Other Ambulatory Visit: Payer: Self-pay | Admitting: Family Medicine

## 2017-08-14 ENCOUNTER — Encounter: Payer: Self-pay | Admitting: Internal Medicine

## 2017-08-14 ENCOUNTER — Ambulatory Visit: Payer: BLUE CROSS/BLUE SHIELD | Admitting: Internal Medicine

## 2017-08-14 VITALS — BP 137/89 | HR 96 | Temp 98.2°F | Ht 69.0 in | Wt 140.0 lb

## 2017-08-14 DIAGNOSIS — Z23 Encounter for immunization: Secondary | ICD-10-CM

## 2017-08-14 MED ORDER — GLECAPREVIR-PIBRENTASVIR 100-40 MG PO TABS
3.0000 | ORAL_TABLET | Freq: Every day | ORAL | 2 refills | Status: DC
Start: 2017-08-14 — End: 2017-08-20

## 2017-08-14 NOTE — Progress Notes (Signed)
[facility]   CC: consideration for treatment for chronic hepatitis C  HPI:  +Kelly Leonard is a 61 y.o. male who presents for initial evaluation and management of chronic hepatitis C.  Patient tested positive earlier this year during routine screning. Hepatitis C-associated risk factors present are: none. Patient denies intranasal drug use, IV drug abuse. Patient has had other studies performed. Results: hepatitis C RNA by PCR, result: positive. Patient has not had prior treatment for Hepatitis C. Patient does have a past history of liver disease. Patient does not have a family history of liver disease. Patient does not  have associated signs or symptoms related to liver disease.  Labs reviewed and confirm chronic hepatitis C with a positive viral load.   Records reviewed from his PCP and he has genotype 1b, hepatitis B core positive, HIV negative, elastography with F3/4 disease but no cirrhosis noted.        Patient does not have documented immunity to Hepatitis A. Patient does not have documented immunity to Hepatitis B.    Review of Systems:   Constitutional: negative for fatigue and malaise Gastrointestinal: negative for diarrhea Integument/breast: negative for rash All other systems reviewed and are negative       Past Medical History:  Diagnosis Date  . Anxiety   . CHF (congestive heart failure) (HCC)   . Chronic systolic congestive heart failure (HCC)   . CKD (chronic kidney disease) stage 4, GFR 15-29 ml/min (HCC)   . COPD (chronic obstructive pulmonary disease) (HCC)   . Dilated cardiomyopathy (HCC)   . Hyperlipidemia   . Hypertension   . MI (myocardial infarction) (HCC)   . Polycystic kidney   . Renal disorder     Prior to Admission medications   Medication Sig Start Date End Date Taking? Authorizing Provider  aspirin EC 81 MG tablet Take 81 mg by mouth daily.   Yes [provider]  b complex vitamins tablet Take 1 tablet  by mouth daily.   Yes [provider]  bumetanide (BUMEX) 1 MG tablet Take 3 tablets (3 mg total) daily by mouth. May take additional 3 mg in the PM as needed for swelling/SOB 07/22/17  Yes Tillery, Mariam Dollar, PA-C  busPIRone (BUSPAR) 5 MG tablet Take 1 tablet (5 mg total) 3 (three) times daily by mouth. 07/15/17  Yes Graciella Freer, PA-C  Cholecalciferol (VITAMIN D3) 1000 units CAPS Take by mouth.   Yes [provider]  isosorbide-hydrALAZINE (BIDIL) 20-37.5 MG tablet Take 2 tablets 3 (three) times daily by mouth.   Yes [provider]  Melatonin 10 MG CAPS Take 10 mg by mouth at bedtime.   Yes [provider]  Multiple Vitamins-Minerals (MULTIVITAMIN WITH MINERALS) tablet Take 1 tablet by mouth daily.   Yes [provider]  sertraline (ZOLOFT) 25 MG tablet Take 1 tablet (25 mg total) daily by mouth. 07/16/17  Yes Gabriel Cirri, NP  allopurinol (ZYLOPRIM) 100 MG tablet Take 1 tablet (100 mg total) daily by mouth. Patient not taking: Reported on 08/08/2017 07/16/17   Graciella Freer, PA-C  Glecaprevir-Pibrentasvir (MAVYRET) 100-40 MG TABS Take 3 tablets by mouth daily. 08/14/17   Comer, Belia Heman, MD  pregabalin (LYRICA) 50 MG capsule Take 1 capsule (50 mg total) by mouth 3 (three) times daily. Patient not taking: Reported on 08/14/2017 08/08/17   Gabriel Cirri, NP  zolpidem (AMBIEN) 5 MG tablet Take 1 tablet (5 mg total) at bedtime as needed by mouth  for sleep. Patient not taking: Reported on 08/08/2017 07/16/17   Gabriel Cirri, NP    Allergies  Allergen Reactions  . Entresto [Sacubitril-Valsartan] Swelling    Swelling of the face  . Penicillins Anaphylaxis and Swelling    Has patient had a PCN reaction causing immediate rash, facial/tongue/throat swelling, SOB or lightheadedness with hypotension: Yes Has patient had a PCN reaction causing severe rash involving mucus membranes or skin necrosis: No Has patient had a PCN reaction  that required hospitalization: No Has patient had a PCN reaction occurring within the last 10 years: No If all of the above answers are "NO", then may proceed with Cephalosporin use.   . Seroquel [Quetiapine] Nausea And Vomiting and Hives    Hives   . Ace Inhibitors Rash  . Corlanor [Ivabradine] Swelling    Facial swelling  . Ambien [Zolpidem Tartrate] Swelling  . Carvedilol Other (See Comments)    "dizziness, light headed, and nausea"  . Hydroxyzine Nausea Only  . Sertraline Diarrhea    itching  . Trazodone And Nefazodone Swelling and Other (See Comments)    "up for days" and lip swelling  . Benadryl [Diphenhydramine Hcl] Other (See Comments)    "makes me crazy and hyper"  . Gabapentin Swelling    Lip and face swelling  . Lorazepam Rash    Rash and severe anxiety  . Torsemide Hives, Itching, Nausea And Vomiting and Rash    Social History   Tobacco Use  . Smoking status: Never Smoker  . Smokeless tobacco: Never Used  Substance Use Topics  . Alcohol use: No  . Drug use: No    Family History  Problem Relation Age of Onset  . Diabetes Mother   . Hypertension Brother   . Heart disease Maternal Grandmother   . Heart attack Maternal Grandmother      Objective:  Constitutional: in no apparent distress and alert,  Vitals:   08/14/17 1046  BP: 137/89  Pulse: 96  Temp: 98.2 F (36.8 C)   Eyes: anicteric Cardiovascular: Cor RRR Respiratory: CTA B; normal respiratory effort Gastrointestinal: Bowel sounds are normal, liver is not enlarged, spleen is not enlarged Musculoskeletal: no pedal edema noted Skin: negatives: no rash; no porphyria cutanea tarda Lymphatic: no cervical lymphadenopathy   Laboratory Genotype: No results found for: HCVGENOTYPE HCV viral load:  Lab Results  Component Value Date   HCVQUANT 1,290,000 07/09/2017   Lab Results  Component Value Date   WBC 9.6 07/14/2017   HGB 11.1 (L) 07/14/2017   HCT 34.9 (L) 07/14/2017   MCV 77.0 (L)  07/14/2017   PLT 284 07/14/2017    Lab Results  Component Value Date   CREATININE 2.95 (H) 07/29/2017   BUN 44 (H) 07/29/2017   NA 141 07/29/2017   K 4.5 07/29/2017   CL 102 07/29/2017   CO2 29 07/29/2017    Lab Results  Component Value Date   ALT 50 07/15/2017   AST 65 (H) 07/15/2017   ALKPHOS 81 07/15/2017     Labs and history reviewed and show CHILD-PUGH A  5-6 points: Child class A 7-9 points: Child class B 10-15 points: Child class C  Lab Results  Component Value Date   INR 1.20 07/07/2017   BILITOT 0.6 07/15/2017   ALBUMIN 2.7 (L) 07/15/2017     Assessment: New Patient with Chronic Hepatitis C genotype 1b, untreated.  I discussed with the patient the lab findings that confirm chronic hepatitis C as well as the natural history and  progression of disease including about 30% of people who develop cirrhosis of the liver if left untreated and once cirrhosis is established there is a 2-7% risk per year of liver cancer and liver failure.  I discussed the importance of treatment and benefits in reducing the risk, even if significant liver fibrosis exists.   Plan: 1) Patient counseled extensively on limiting acetaminophen to no more than 2 grams daily, avoidance of alcohol. 2) Transmission discussed with patient including sexual transmission, sharing razors and toothbrush.   3) Will need referral to gastroenterology if concern for cirrhosis 4) Will need referral for substance abuse counseling: No.; Further work up to include urine drug screen  No. 5) Will prescribe Mayvret for 12 weeks due to CKD stage 4.  6) Hepatitis A vaccine Yes.   7) Hepatitis B vaccine No. will check hepatitis B surface Ab with his next labs 8) Pneumovax vaccine if not previously given 9) will start the process for the medication and he will return after starting 10) will need HCC screening every 6 months.

## 2017-08-14 NOTE — Patient Instructions (Signed)
Date 08/14/17  Dear Kelly Leonard, As discussed in the ID Clinic, your hepatitis C therapy will include the following medications:          Mavyret (glecaprevir 100 mg/pibrentasvir 40 mg): Take 3 tablets by mouth once daily for 12 weeks ---------------------------------------------------------------- Your HCV Treatment Start Date: TBA   Your HCV genotype:  1b    Liver Fibrosis: F3/4    ---------------------------------------------------------------- YOUR PHARMACY CONTACT:   [facility] [address] [place], [place] [address] Phone: [phone] Hours: Monday to Friday 7:30 am to 6:00 pm   Please always contact your pharmacy at least 3-4 business days before you run out of medications to ensure your next month's medication is ready or 1 week prior to running out if you receive it by mail.  Remember, each prescription is for 28 days. ---------------------------------------------------------------- GENERAL NOTES REGARDING YOUR HEPATITIS C MEDICATION:  Mavyret: - tablets are pink, oblong shape - take 3 tablets daily with food. - The tablets should be stored at room temperature.  - Acid reducing agents such as H2 blockers (ie. Pepcid (famotidine), Zantac (ranitidine), Tagamet (cimetidine), Axid (nizatidine) and proton pump inhibitors (ie. Prilosec (omeprazole), Protonix (pantoprazole), Nexium (esomeprazole), or Aciphex (rabeprazole)) can decrease effectiveness of Harvoni. Do not take until you have discussed with a health care provider.    -Antacids that contain magnesium and/or aluminum hydroxide (ie. Milk of Magensia, Rolaids, Gaviscon, Maalox, Mylanta, an dArthritis Pain Formula) can reduce absorption of Harvoni, so take them at least 4 hours before or after Harvoni.  -Calcium carbonate (calcium supplements or antacids such as Tums, Caltrate, Os-Cal) needs to be taken at least 4 hours hours before or after Harvoni.  -St. John's wort or any products that contain  St. John's wort like some herbal supplements  Please inform the office prior to starting any of these medications.  - The common side effects associated with Mavyret include:      1. Fatigue      2. Headache      3. Nausea      4. Diarrhea      5. Insomnia  Please note that this only lists the most common side effects and is NOT a comprehensive list of the potential side effects of these medications. For more information, please review the drug information sheets that come with your medication package from the pharmacy.  ---------------------------------------------------------------- GENERAL HELPFUL HINTS ON HCV THERAPY: 1. Stay well-hydrated. 2. Notify the ID Clinic of any changes in your other over-the-counter/herbal or prescription medications. 3. If you miss a dose of your medication, take the missed dose as soon as you remember. Return to your regular time/dose schedule the next day.  4.  Do not stop taking your medications without first talking with your healthcare provider. 5.  You may take Tylenol (acetaminophen), as long as the dose is less than 2000 mg (OR no more than 4 tablets of the Tylenol Extra Strengths 500mg  tablet) in 24 hours. 6.  You will see our pharmacist-specialist within the first 2 weeks of starting your medication to monitor for any possible side effects. 7.  You will have labs once during treatment, after soon after treatment completion and one final lab 6 months after treatment completion to verify the virus is out of your system.  Staci Righter, MD  [facility] [address] [place], [place]  [address] [phone]

## 2017-08-19 ENCOUNTER — Encounter (HOSPITAL_COMMUNITY): Payer: Self-pay

## 2017-08-20 ENCOUNTER — Other Ambulatory Visit: Payer: Self-pay | Admitting: Pharmacist Clinician (PhC)/ Clinical Pharmacy Specialist

## 2017-08-20 MED ORDER — GLECAPREVIR-PIBRENTASVIR 100-40 MG PO TABS
3.0000 | ORAL_TABLET | Freq: Every day | ORAL | 2 refills | Status: DC
Start: 2017-08-20 — End: 2017-08-27

## 2017-08-20 NOTE — Progress Notes (Signed)
Has to be tx to [facility]. Sent to [facility]

## 2017-08-21 ENCOUNTER — Ambulatory Visit: Payer: BLUE CROSS/BLUE SHIELD | Admitting: Internal Medicine

## 2017-08-27 ENCOUNTER — Other Ambulatory Visit: Payer: Self-pay | Admitting: Pharmacist

## 2017-08-27 ENCOUNTER — Ambulatory Visit (HOSPITAL_COMMUNITY)
Admission: RE | Admit: 2017-08-27 | Discharge: 2017-08-27 | Disposition: A | Discharge status: Home / Self Care | Payer: BLUE CROSS/BLUE SHIELD | Source: Ambulatory Visit | Attending: Cardiology | Admitting: Cardiology

## 2017-08-27 ENCOUNTER — Encounter (HOSPITAL_COMMUNITY): Payer: Self-pay

## 2017-08-27 VITALS — BP 136/78 | HR 91 | Wt 140.6 lb

## 2017-08-27 DIAGNOSIS — N390 Urinary tract infection, site not specified: Secondary | ICD-10-CM | POA: Insufficient documentation

## 2017-08-27 DIAGNOSIS — Z79899 Other long term (current) drug therapy: Secondary | ICD-10-CM | POA: Diagnosis not present

## 2017-08-27 DIAGNOSIS — Z8249 Family history of ischemic heart disease and other diseases of the circulatory system: Secondary | ICD-10-CM | POA: Insufficient documentation

## 2017-08-27 DIAGNOSIS — Q613 Polycystic kidney, unspecified: Secondary | ICD-10-CM | POA: Insufficient documentation

## 2017-08-27 DIAGNOSIS — M79671 Pain in right foot: Secondary | ICD-10-CM | POA: Insufficient documentation

## 2017-08-27 DIAGNOSIS — M10071 Idiopathic gout, right ankle and foot: Secondary | ICD-10-CM

## 2017-08-27 DIAGNOSIS — Z7982 Long term (current) use of aspirin: Secondary | ICD-10-CM | POA: Diagnosis not present

## 2017-08-27 DIAGNOSIS — N184 Chronic kidney disease, stage 4 (severe): Secondary | ICD-10-CM | POA: Diagnosis not present

## 2017-08-27 DIAGNOSIS — N179 Acute kidney failure, unspecified: Secondary | ICD-10-CM | POA: Insufficient documentation

## 2017-08-27 DIAGNOSIS — I130 Hypertensive heart and chronic kidney disease with heart failure and stage 1 through stage 4 chronic kidney disease, or unspecified chronic kidney disease: Secondary | ICD-10-CM | POA: Insufficient documentation

## 2017-08-27 DIAGNOSIS — J449 Chronic obstructive pulmonary disease, unspecified: Secondary | ICD-10-CM | POA: Diagnosis not present

## 2017-08-27 DIAGNOSIS — M109 Gout, unspecified: Secondary | ICD-10-CM | POA: Insufficient documentation

## 2017-08-27 DIAGNOSIS — Z833 Family history of diabetes mellitus: Secondary | ICD-10-CM | POA: Diagnosis not present

## 2017-08-27 DIAGNOSIS — E785 Hyperlipidemia, unspecified: Secondary | ICD-10-CM | POA: Insufficient documentation

## 2017-08-27 DIAGNOSIS — I5022 Chronic systolic (congestive) heart failure: Secondary | ICD-10-CM

## 2017-08-27 DIAGNOSIS — I420 Dilated cardiomyopathy: Secondary | ICD-10-CM | POA: Diagnosis not present

## 2017-08-27 DIAGNOSIS — F419 Anxiety disorder, unspecified: Secondary | ICD-10-CM | POA: Diagnosis not present

## 2017-08-27 DIAGNOSIS — Z888 Allergy status to other drugs, medicaments and biological substances status: Secondary | ICD-10-CM | POA: Diagnosis not present

## 2017-08-27 DIAGNOSIS — Z880 Allergy status to penicillin: Secondary | ICD-10-CM | POA: Insufficient documentation

## 2017-08-27 DIAGNOSIS — I252 Old myocardial infarction: Secondary | ICD-10-CM | POA: Insufficient documentation

## 2017-08-27 DIAGNOSIS — B182 Chronic viral hepatitis C: Secondary | ICD-10-CM

## 2017-08-27 LAB — URIC ACID: URIC ACID, SERUM: 11.2 mg/dL — AB (ref 4.4–7.6)

## 2017-08-27 MED ORDER — GLECAPREVIR-PIBRENTASVIR 100-40 MG PO TABS: 3.0000 | ORAL_TABLET | Freq: Every day | ORAL | 2 refills | Status: DC

## 2017-08-27 MED ORDER — PREDNISONE 20 MG PO TABS: 40.0000 mg | ORAL_TABLET | Freq: Every day | ORAL | 0 refills | Status: DC

## 2017-08-27 NOTE — Progress Notes (Signed)
Advanced Heart Failure Clinic Note  Primary Physician: Gabriel Cirri NP  Primary Cardiologist:  [facility] Nephrology: Dr Glenna Fellows  Primary HF: Dr. Gala Romney   HPI: Kelly Leonard is a 61 y.o. 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    Patient admitted to [facility] in 10/18 with low output HF and AKI. Transferred to [facility] for further management.  On arrival to [facility], swan placed (07/07/17) which showed low output physiology. RA = 12 PA =   48/22 (32) PCW = 14 Thermo cardiac output/index = 4.3/2.4 Fick CO/CI = 2.7/1.6 Ao sat = 96% PA sat = 41%  Started on dobutamine. HF meds optimized. Dobutamine stopped since 07/13/17. Creatinine peaked and 4.14 and improved to 2.5 with dobutamine  Hospital course complicated by severe anxiety and seen by Psych who adjusted meds with excellent improvement.  Hepatitis C diagnosis as well as UTI. Plan for ID follow up as outpatient. Pt completed course of levaquin for UTI. D/c weight 139.  He returns for HF follow with his wife and daughter. Overall feeling fine. He is complaining about R foot pain. Denies SOB/PND/Orthopnea. Appetite ok but having mild nausea. No fever or chills. Weight at home 137-139 pounds. Taking all medications  Review of systems complete and found to be negative unless listed in HPI.    Past Medical History:  Diagnosis Date  . Anxiety   . CHF (congestive heart failure) (HCC)   . Chronic systolic congestive heart failure (HCC)   . CKD (chronic kidney disease) stage 4, GFR 15-29 ml/min (HCC)   . COPD (chronic obstructive pulmonary disease) (HCC)   . Dilated cardiomyopathy (HCC)   . Hyperlipidemia   . Hypertension   . MI (myocardial infarction) (HCC)   . Polycystic kidney   . Renal disorder     Current Outpatient Medications  Medication Sig Dispense Refill  . allopurinol (ZYLOPRIM) 100 MG tablet Take 1 tablet (100 mg  total) daily by mouth. 30 tablet 3  . aspirin EC 81 MG tablet Take 81 mg by mouth daily.    Marland Kitchen b complex vitamins tablet Take 1 tablet by mouth daily.    . bumetanide (BUMEX) 1 MG tablet Take 3 tablets (3 mg total) daily by mouth. May take additional 3 mg in the PM as needed for swelling/SOB 90 tablet 3  . busPIRone (BUSPAR) 5 MG tablet Take 1 tablet (5 mg total) 3 (three) times daily by mouth. 90 tablet 3  . Cholecalciferol (VITAMIN D3) 1000 units CAPS Take by mouth.    . Glecaprevir-Pibrentasvir (MAVYRET) 100-40 MG TABS Take 3 tablets by mouth daily. 84 tablet 2  . isosorbide-hydrALAZINE (BIDIL) 20-37.5 MG tablet Take 2 tablets 3 (three) times daily by mouth.    . Melatonin 10 MG CAPS Take 10 mg by mouth at bedtime.    . Multiple Vitamins-Minerals (MULTIVITAMIN WITH MINERALS) tablet Take 1 tablet by mouth daily.    . pregabalin (LYRICA) 50 MG capsule Take 1 capsule (50 mg total) by mouth 3 (three) times daily. 60 capsule 2  . sertraline (ZOLOFT) 25 MG tablet Take 1 tablet (25 mg total) daily by mouth. 90 tablet 1  . zolpidem (AMBIEN) 5 MG tablet Take 1 tablet (5 mg total) at bedtime as needed by mouth for sleep. 30 tablet 2   No current facility-administered medications for this encounter.    Allergies  Allergen Reactions  . Entresto [Sacubitril-Valsartan] Swelling  Swelling of the face  . Penicillins Anaphylaxis and Swelling    Has patient had a PCN reaction causing immediate rash, facial/tongue/throat swelling, SOB or lightheadedness with hypotension: Yes Has patient had a PCN reaction causing severe rash involving mucus membranes or skin necrosis: No Has patient had a PCN reaction that required hospitalization: No Has patient had a PCN reaction occurring within the last 10 years: No If all of the above answers are "NO", then may proceed with Cephalosporin use.   . Seroquel [Quetiapine] Nausea And Vomiting and Hives    Hives   . Ace Inhibitors Rash  . Corlanor [Ivabradine]  Swelling    Facial swelling  . Ambien [Zolpidem Tartrate] Swelling  . Carvedilol Other (See Comments)    "dizziness, light headed, and nausea"  . Hydroxyzine Nausea Only  . Sertraline Diarrhea    itching  . Trazodone And Nefazodone Swelling and Other (See Comments)    "up for days" and lip swelling  . Benadryl [Diphenhydramine Hcl] Other (See Comments)    "makes me crazy and hyper"  . Gabapentin Swelling    Lip and face swelling  . Lorazepam Rash    Rash and severe anxiety  . Torsemide Hives, Itching, Nausea And Vomiting and Rash    Social History   Socioeconomic History  . Marital status: Single    Spouse name: Not on file  . Number of children: Not on file  . Years of education: Not on file  . Highest education level: Not on file  Social Needs  . Financial resource strain: Not on file  . Food insecurity - worry: Not on file  . Food insecurity - inability: Not on file  . Transportation needs - medical: Not on file  . Transportation needs - non-medical: Not on file  Occupational History  . Not on file  Tobacco Use  . Smoking status: Never Smoker  . Smokeless tobacco: Never Used  Substance and Sexual Activity  . Alcohol use: No  . Drug use: No  . Sexual activity: Yes  Other Topics Concern  . Not on file  Social History Narrative  . Not on file    Family History  Problem Relation Age of Onset  . Diabetes Mother   . Hypertension Brother   . Heart disease Maternal Grandmother   . Heart attack Maternal Grandmother    Vitals:   08/27/17 1031  BP: 136/78  Pulse: 91  SpO2: 98%  Weight: 140 lb 9.6 oz (63.8 kg)    Wt Readings from Last 3 Encounters:  08/27/17 140 lb 9.6 oz (63.8 kg)  08/14/17 140 lb (63.5 kg)  08/08/17 147 lb (66.7 kg)    PHYSICAL EXAM: General:  Thin.  No resp difficulty HEENT: normal Neck: supple. no JVD. Carotids 2+ bilat; no bruits. No lymphadenopathy or thryomegaly appreciated. Cor: PMI nondisplaced. Regular rate & rhythm. No rubs, or  murmurs. + S3 Lungs: clear Abdomen: soft, nontender, nondistended. No hepatosplenomegaly. No bruits or masses. Good bowel sounds. Extremities: no cyanosis, clubbing, rash, edema Neuro: alert & orientedx3, cranial nerves grossly intact. moves all 4 extremities w/o difficulty. Affect pleasant   ASSESSMENT & PLAN: 1. A/C Systolic Heart Failure - NICM. ECHO EF 05/2017 EF 20-25%.  - Volume status stable. Continue bumex 3 mg twice a day.   No Spiro/ACE/ARB/ARNI with CKD IV.  - No BB yet with low output.  -Allergic to entresto, carvedilol, torsemide, ace.  -HOld off bidil with nausea.  Will need to consider adding Bidil soon  2. CKD stage IV in setting of polycystic KD - Creatinine baseline 2.8-3.0 in setting of PCKD and HF  3. Anxiety  - Severe.  4. HCV - Refer to ID 5. Gout  Check uric acid.  Continue allopurinol.  Give 40mg  prednisone daily x 3 days.   Follow up in 6 weeks.  Greater than 50% of the (total minutes 25) visit spent in counseling/coordination of care regarding the above.    Tonye BecketAmy Apollos Tenbrink, NP 08/27/17

## 2017-08-27 NOTE — Patient Instructions (Signed)
START prednisone 40 mg (2 tabs) once daily for 3 days.  Routine lab work today. Will notify you of abnormal results, otherwise no news is good news!  Follow up 6 weeks with Dr. Gala Romney.  ____________________________________________________________ [place] Code: 9000  Take all medication as prescribed the day of your appointment. Bring all medications with you to your appointment.  Do the following things EVERYDAY: 1) Weigh yourself in the morning before breakfast. Write it down and keep it in a log. 2) Take your medicines as prescribed 3) Eat low salt foods-Limit salt (sodium) to 2000 mg per day.  4) Stay as active as you can everyday 5) Limit all fluids for the day to less than 2 liters

## 2017-09-01 ENCOUNTER — Ambulatory Visit: Payer: BLUE CROSS/BLUE SHIELD | Admitting: Unknown Physician Specialty

## 2017-09-05 ENCOUNTER — Ambulatory Visit: Payer: BLUE CROSS/BLUE SHIELD | Admitting: Unknown Physician Specialty

## 2017-09-08 ENCOUNTER — Ambulatory Visit: Payer: BLUE CROSS/BLUE SHIELD | Admitting: Unknown Physician Specialty

## 2017-09-09 ENCOUNTER — Encounter: Payer: Self-pay | Admitting: Emergency Medicine

## 2017-09-09 ENCOUNTER — Emergency Department
Admission: EM | Admit: 2017-09-09 | Discharge: 2017-09-09 | Disposition: A | Discharge status: Home / Self Care | Payer: BLUE CROSS/BLUE SHIELD | Attending: Emergency Medicine | Admitting: Emergency Medicine

## 2017-09-09 ENCOUNTER — Emergency Department: Payer: BLUE CROSS/BLUE SHIELD

## 2017-09-09 ENCOUNTER — Other Ambulatory Visit: Payer: Self-pay

## 2017-09-09 DIAGNOSIS — N184 Chronic kidney disease, stage 4 (severe): Secondary | ICD-10-CM | POA: Insufficient documentation

## 2017-09-09 DIAGNOSIS — I5023 Acute on chronic systolic (congestive) heart failure: Secondary | ICD-10-CM | POA: Insufficient documentation

## 2017-09-09 DIAGNOSIS — Z79899 Other long term (current) drug therapy: Secondary | ICD-10-CM | POA: Diagnosis not present

## 2017-09-09 DIAGNOSIS — M109 Gout, unspecified: Secondary | ICD-10-CM | POA: Insufficient documentation

## 2017-09-09 DIAGNOSIS — Z7982 Long term (current) use of aspirin: Secondary | ICD-10-CM | POA: Diagnosis not present

## 2017-09-09 DIAGNOSIS — I130 Hypertensive heart and chronic kidney disease with heart failure and stage 1 through stage 4 chronic kidney disease, or unspecified chronic kidney disease: Secondary | ICD-10-CM | POA: Insufficient documentation

## 2017-09-09 DIAGNOSIS — J449 Chronic obstructive pulmonary disease, unspecified: Secondary | ICD-10-CM | POA: Insufficient documentation

## 2017-09-09 DIAGNOSIS — I252 Old myocardial infarction: Secondary | ICD-10-CM | POA: Diagnosis not present

## 2017-09-09 DIAGNOSIS — I5022 Chronic systolic (congestive) heart failure: Secondary | ICD-10-CM | POA: Insufficient documentation

## 2017-09-09 DIAGNOSIS — R2243 Localized swelling, mass and lump, lower limb, bilateral: Secondary | ICD-10-CM | POA: Diagnosis present

## 2017-09-09 LAB — TROPONIN I: Troponin I: 0.06 ng/mL (ref <0.03)

## 2017-09-09 LAB — BASIC METABOLIC PANEL
ANION GAP: 12 (ref 5–15)
BUN: 67 mg/dL — ABNORMAL HIGH (ref 6–20)
CALCIUM: 8.9 mg/dL (ref 8.9–10.3)
CHLORIDE: 93 mmol/L — AB (ref 101–111)
CO2: 35 mmol/L — AB (ref 22–32)
Creatinine, Ser: 3.89 mg/dL — ABNORMAL HIGH (ref 0.61–1.24)
GFR calc non Af Amer: 15 mL/min — ABNORMAL LOW (ref >60)
GFR, EST AFRICAN AMERICAN: 18 mL/min — AB (ref >60)
GLUCOSE: 102 mg/dL — AB (ref 65–99)
POTASSIUM: 4.2 mmol/L (ref 3.5–5.1)
Sodium: 140 mmol/L (ref 135–145)

## 2017-09-09 LAB — CBC
HEMATOCRIT: 30.7 % — AB (ref 40.0–52.0)
HEMOGLOBIN: 9.6 g/dL — AB (ref 13.0–18.0)
MCH: 23.2 pg — ABNORMAL LOW (ref 26.0–34.0)
MCHC: 31.4 g/dL — ABNORMAL LOW (ref 32.0–36.0)
MCV: 74 fL — AB (ref 80.0–100.0)
Platelets: 277 10*3/uL (ref 150–440)
RBC: 4.15 MIL/uL — AB (ref 4.40–5.90)
RDW: 17.8 % — AB (ref 11.5–14.5)
WBC: 5.8 10*3/uL (ref 3.8–10.6)

## 2017-09-09 LAB — URIC ACID: URIC ACID, SERUM: 7.3 mg/dL (ref 4.4–7.6)

## 2017-09-09 LAB — BRAIN NATRIURETIC PEPTIDE: B NATRIURETIC PEPTIDE 5: 1649 pg/mL — AB (ref 0.0–100.0)

## 2017-09-09 MED ORDER — OXYCODONE-ACETAMINOPHEN 5-325 MG PO TABS: 1.0000 | ORAL_TABLET | Freq: Once | ORAL | Status: DC

## 2017-09-09 MED ORDER — MORPHINE SULFATE (PF) 2 MG/ML IV SOLN
2.0000 mg | Freq: Once | INTRAVENOUS | Status: AC
  Administered 2017-09-09: 2 mg via INTRAVENOUS
  Filled 2017-09-09: qty 1

## 2017-09-09 MED ORDER — FUROSEMIDE 10 MG/ML IJ SOLN
60.0000 mg | Freq: Once | INTRAMUSCULAR | Status: AC
  Administered 2017-09-09: 60 mg via INTRAVENOUS
  Filled 2017-09-09: qty 8

## 2017-09-09 MED ORDER — OXYCODONE-ACETAMINOPHEN 5-325 MG PO TABS: 1.0000 | ORAL_TABLET | ORAL | 0 refills | Status: DC | PRN

## 2017-09-09 NOTE — ED Provider Notes (Signed)
[facility] Emergency Department Provider Note   First MD Initiated Contact with Patient 09/09/17 0245     (approximate)  I have reviewed the triage vital signs and the nursing notes.   HISTORY  Chief Complaint Leg Swelling    HPI Kelly Leonard is a 62 y.o. male with below list of chronic medical conditions including CHF chronic kidney disease and gout presents to the emergency department with bilateral lower extremity swelling and right foot pain which is currently 8 out of 10.  Patient states that he was recently diagnosed with gout.  Patient denies any chest pain no shortness of breath.  Patient admits to being compliant with Bumex at home.     Past Medical History:  Diagnosis Date  . Anxiety   . CHF (congestive heart failure) (HCC)   . Chronic systolic congestive heart failure (HCC)   . CKD (chronic kidney disease) stage 4, GFR 15-29 ml/min (HCC)   . COPD (chronic obstructive pulmonary disease) (HCC)   . Dilated cardiomyopathy (HCC)   . Hyperlipidemia   . Hypertension   . MI (myocardial infarction) (HCC)   . Polycystic kidney   . Renal disorder     Patient Active Problem List   Diagnosis Date Noted  . Acute on chronic systolic heart failure (HCC) 07/07/2017  . Acute renal failure with acute tubular necrosis superimposed on stage 4 chronic kidney disease (HCC)   . Hyponatremia 07/05/2017  . Cramps, muscle, general 07/05/2017  . Weakness generalized 07/05/2017  . Acute exacerbation of CHF (congestive heart failure) (HCC) 06/24/2017  . Acute on chronic systolic (congestive) heart failure (HCC) 05/12/2017  . Insomnia 05/05/2017  . Advanced care planning/counseling discussion 03/24/2017  . 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 Hypertension   . Hyperlipidemia   . Chronic systolic congestive heart failure (HCC)   . Dilated cardiomyopathy (HCC)   . Polycystic kidney   . CKD (chronic kidney disease) stage 4, GFR 15-29 ml/min  (HCC)   . 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 Anxiety disorder, unspecified 03/03/2013    Past Surgical History:  Procedure Laterality Date  . CARDIAC CATHETERIZATION    . IMPLANTABLE CARDIOVERTER DEFIBRILLATOR (ICD) GENERATOR CHANGE Right 06/23/2015   Procedure: ICD GENERATOR  INITIAL IMPLANT;  Surgeon: Sharion Settler, MD;  Location: [facility] ORS;  Service: Cardiovascular;  Laterality: Right;    Prior to Admission medications   Medication Sig Start Date End Date Taking? Authorizing Provider  allopurinol (ZYLOPRIM) 100 MG tablet Take 1 tablet (100 mg total) daily by mouth. Patient not taking: Reported on 08/27/2017 07/16/17   Graciella Freer, PA-C  aspirin EC 81 MG tablet Take 81 mg by mouth daily.    [provider]  b complex vitamins tablet Take 1 tablet by mouth daily.    [provider]  bumetanide (BUMEX) 1 MG tablet Take 3 tablets (3 mg total) daily by mouth. May take additional 3 mg in the PM as needed for swelling/SOB 07/22/17   Graciella Freer, PA-C  busPIRone (BUSPAR) 5 MG tablet Take 1 tablet (5 mg total) 3 (three) times daily by mouth. 07/15/17   Graciella Freer, PA-C  Cholecalciferol (VITAMIN D3) 1000 units CAPS Take by mouth.    [provider]  Glecaprevir-Pibrentasvir (MAVYRET) 100-40 MG TABS Take 3 tablets by mouth daily with breakfast. 08/27/17   Kuppelweiser, Cassie L, RPH-CPP  isosorbide-hydrALAZINE (BIDIL) 20-37.5 MG tablet Take 2 tablets 3 (three) times daily by mouth.  [provider]  Melatonin 10 MG CAPS Take 10 mg by mouth at bedtime.    [provider]  Multiple Vitamins-Minerals (MULTIVITAMIN WITH MINERALS) tablet Take 1 tablet by mouth daily.    [provider]  oxyCODONE-acetaminophen (ROXICET) 5-325 MG tablet Take 1 tablet by mouth every 4 (four) hours as needed for severe pain. 09/09/17   Darci Current, MD  predniSONE (DELTASONE) 20 MG tablet Take 2  tablets (40 mg total) by mouth daily with breakfast. 08/27/17   Clegg, Amy D, NP  pregabalin (LYRICA) 50 MG capsule Take 1 capsule (50 mg total) by mouth 3 (three) times daily. 08/08/17   Gabriel Cirri, NP  sertraline (ZOLOFT) 25 MG tablet Take 1 tablet (25 mg total) daily by mouth. 07/16/17   Gabriel Cirri, NP  zolpidem (AMBIEN) 5 MG tablet Take 1 tablet (5 mg total) at bedtime as needed by mouth for sleep. 07/16/17   Gabriel Cirri, NP    Allergies Sherryll Burger [sacubitril-valsartan]; Penicillins; Seroquel [quetiapine]; Ace inhibitors; Corlanor [ivabradine]; Ambien [zolpidem tartrate]; Carvedilol; Hydroxyzine; Sertraline; Trazodone and nefazodone; Benadryl [diphenhydramine hcl]; Gabapentin; Lorazepam; and Torsemide  Family History  Problem Relation Age of Onset  . Diabetes Mother   . Hypertension Brother   . Heart disease Maternal Grandmother   . Heart attack Maternal Grandmother     Social History Social History   Tobacco Use  . Smoking status: Never Smoker  . Smokeless tobacco: Never Used  Substance Use Topics  . Alcohol use: No  . Drug use: No    Review of Systems Constitutional: No fever/chills Eyes: No visual changes. ENT: No sore throat. Cardiovascular: Denies chest pain. Respiratory: Denies shortness of breath. Gastrointestinal: No abdominal pain.  No nausea, no vomiting.  No diarrhea.  No constipation. Genitourinary: Negative for dysuria. Musculoskeletal: Negative for neck pain.  Negative for back pain.  Positive for bilateral lower extremity swelling.  Positive for right foot pain Integumentary: Negative for rash. Neurological: Negative for headaches, focal weakness or numbness.  ____________________________________________   PHYSICAL EXAM:  VITAL SIGNS: ED Triage Vitals  Enc Vitals Group     BP 09/09/17 0155 118/68     Pulse Rate 09/09/17 0155 90     Resp 09/09/17 0155 18     Temp 09/09/17 0155 98.2 F (36.8 C)     Temp Source 09/09/17 0155 Oral     SpO2  09/09/17 0155 98 %     Weight 09/09/17 0146 63.5 kg (140 lb)     Height 09/09/17 0146 1.753 m (5\' 9" )     Head Circumference --      Peak Flow --      Pain Score 09/09/17 0248 8     Pain Loc --      Pain Edu? --      Excl. in GC? --     Constitutional: Alert and oriented. Well appearing and in no acute distress. Eyes: Conjunctivae are normal.  Head: Atraumatic. Mouth/Throat: Mucous membranes are moist.  Oropharynx non-erythematous. Neck: No stridor.   Cardiovascular: Normal rate, regular rhythm. Good peripheral circulation. Grossly normal heart sounds. Respiratory: Normal respiratory effort.  No retractions. Lungs CTAB. Gastrointestinal: Soft and nontender. No distention.  Musculoskeletal: 1+ bilateral lower extremity pitting edema no gross deformities of extremities. Neurologic:  Normal speech and language. No gross focal neurologic deficits are appreciated.  Skin:  Skin is warm, dry and intact. No rash noted. Psychiatric: Mood and affect are normal. Speech and behavior are normal.  ____________________________________________   LABS (  all labs ordered are listed, but only abnormal results are displayed)  Labs Reviewed  CBC - Abnormal; Notable for the following components:      Result Value   RBC 4.15 (*)    Hemoglobin 9.6 (*)    HCT 30.7 (*)    MCV 74.0 (*)    MCH 23.2 (*)    MCHC 31.4 (*)    RDW 17.8 (*)    All other components within normal limits  BASIC METABOLIC PANEL - Abnormal; Notable for the following components:   Chloride 93 (*)    CO2 35 (*)    Glucose, Bld 102 (*)    BUN 67 (*)    Creatinine, Ser 3.89 (*)    GFR calc non Af Amer 15 (*)    GFR calc Af Amer 18 (*)    All other components within normal limits  TROPONIN I - Abnormal; Notable for the following components:   Troponin I 0.06 (*)    All other components within normal limits  BRAIN NATRIURETIC PEPTIDE - Abnormal; Notable for the following components:   B Natriuretic Peptide 1,649.0 (*)    All  other components within normal limits  URIC ACID   ____________________________________________  EKG  ED ECG REPORT I, [place] Yaremi Stahlman, the attending physician, personally viewed and interpreted this ECG.   Date: 09/09/2017  EKG Time: 1:59 AM  Rate: 87  Rhythm: Normal sinus rhythm  Axis: Left axis deviation  Intervals: Normal  ST&T Change: None  ____________________________________________  RADIOLOGY I, [place] N Preston Garabedian, personally viewed and evaluated these images (plain radiographs) as part of my medical decision making, as well as reviewing the written report by the radiologist.  Dg Chest 2 View  Result Date: 09/09/2017 CLINICAL DATA:  Acute onset of bilateral foot and leg swelling. Shortness of breath. EXAM: CHEST  2 VIEW COMPARISON:  Chest radiograph performed 07/07/2017 FINDINGS: The lungs are well-aerated. Pulmonary vascularity is at the upper limits of normal. There is no evidence of focal opacification, pleural effusion or pneumothorax. The heart is mildly enlarged. An AICD is noted at the left chest wall, with a single lead ending at the right ventricle. No acute osseous abnormalities are seen. IMPRESSION: No acute cardiopulmonary process seen.  Mild cardiomegaly. Electronically Signed   By: Roanna Raider M.D.   On: 09/09/2017 02:17      Procedures   ____________________________________________   INITIAL IMPRESSION / ASSESSMENT AND PLAN / ED COURSE  As part of my medical decision making, I reviewed the following data within the electronic MEDICAL RECORD [id number] year old male presenting with above-stated history and physical exam concerning for peripheral edema most likely secondary to congestive heart failure. Patient given Lasix 60mg  with "excellent" urine production. ____________________________________________  FINAL CLINICAL IMPRESSION(S) / ED DIAGNOSES  Final diagnoses:  Acute on chronic systolic congestive heart failure (HCC)  Acute gout of foot, unspecified  cause, unspecified laterality     MEDICATIONS GIVEN DURING THIS VISIT:  Medications  oxyCODONE-acetaminophen (PERCOCET/ROXICET) 5-325 MG per tablet 1 tablet (not administered)  furosemide (LASIX) injection 60 mg (60 mg Intravenous Given 09/09/17 0320)  morphine 2 MG/ML injection 2 mg (2 mg Intravenous Given 09/09/17 1610)     ED Discharge Orders        Ordered    oxyCODONE-acetaminophen (ROXICET) 5-325 MG tablet  Every 4 hours PRN     09/09/17 0518       Note:  This document was prepared using Dragon voice recognition software and may include unintentional dictation  errors.    Darci Current, MD 09/09/17 [phone]

## 2017-09-09 NOTE — ED Triage Notes (Signed)
Patient ambulatory to triage with steady gait, without difficulty or distress noted; pt reports bilat leg/feet swelling accomp by [facility]

## 2017-09-10 ENCOUNTER — Other Ambulatory Visit (HOSPITAL_COMMUNITY): Payer: Self-pay | Admitting: *Deleted

## 2017-09-10 IMAGING — DX DG CHEST 1V PORT
1 series · 1 of 1 positions shown · non-contrast
Comparison: 03/15/2017

CLINICAL DATA: Shortness of breath.  Productive cough.

EXAM:
PORTABLE CHEST 1 VIEW

[chest ap]
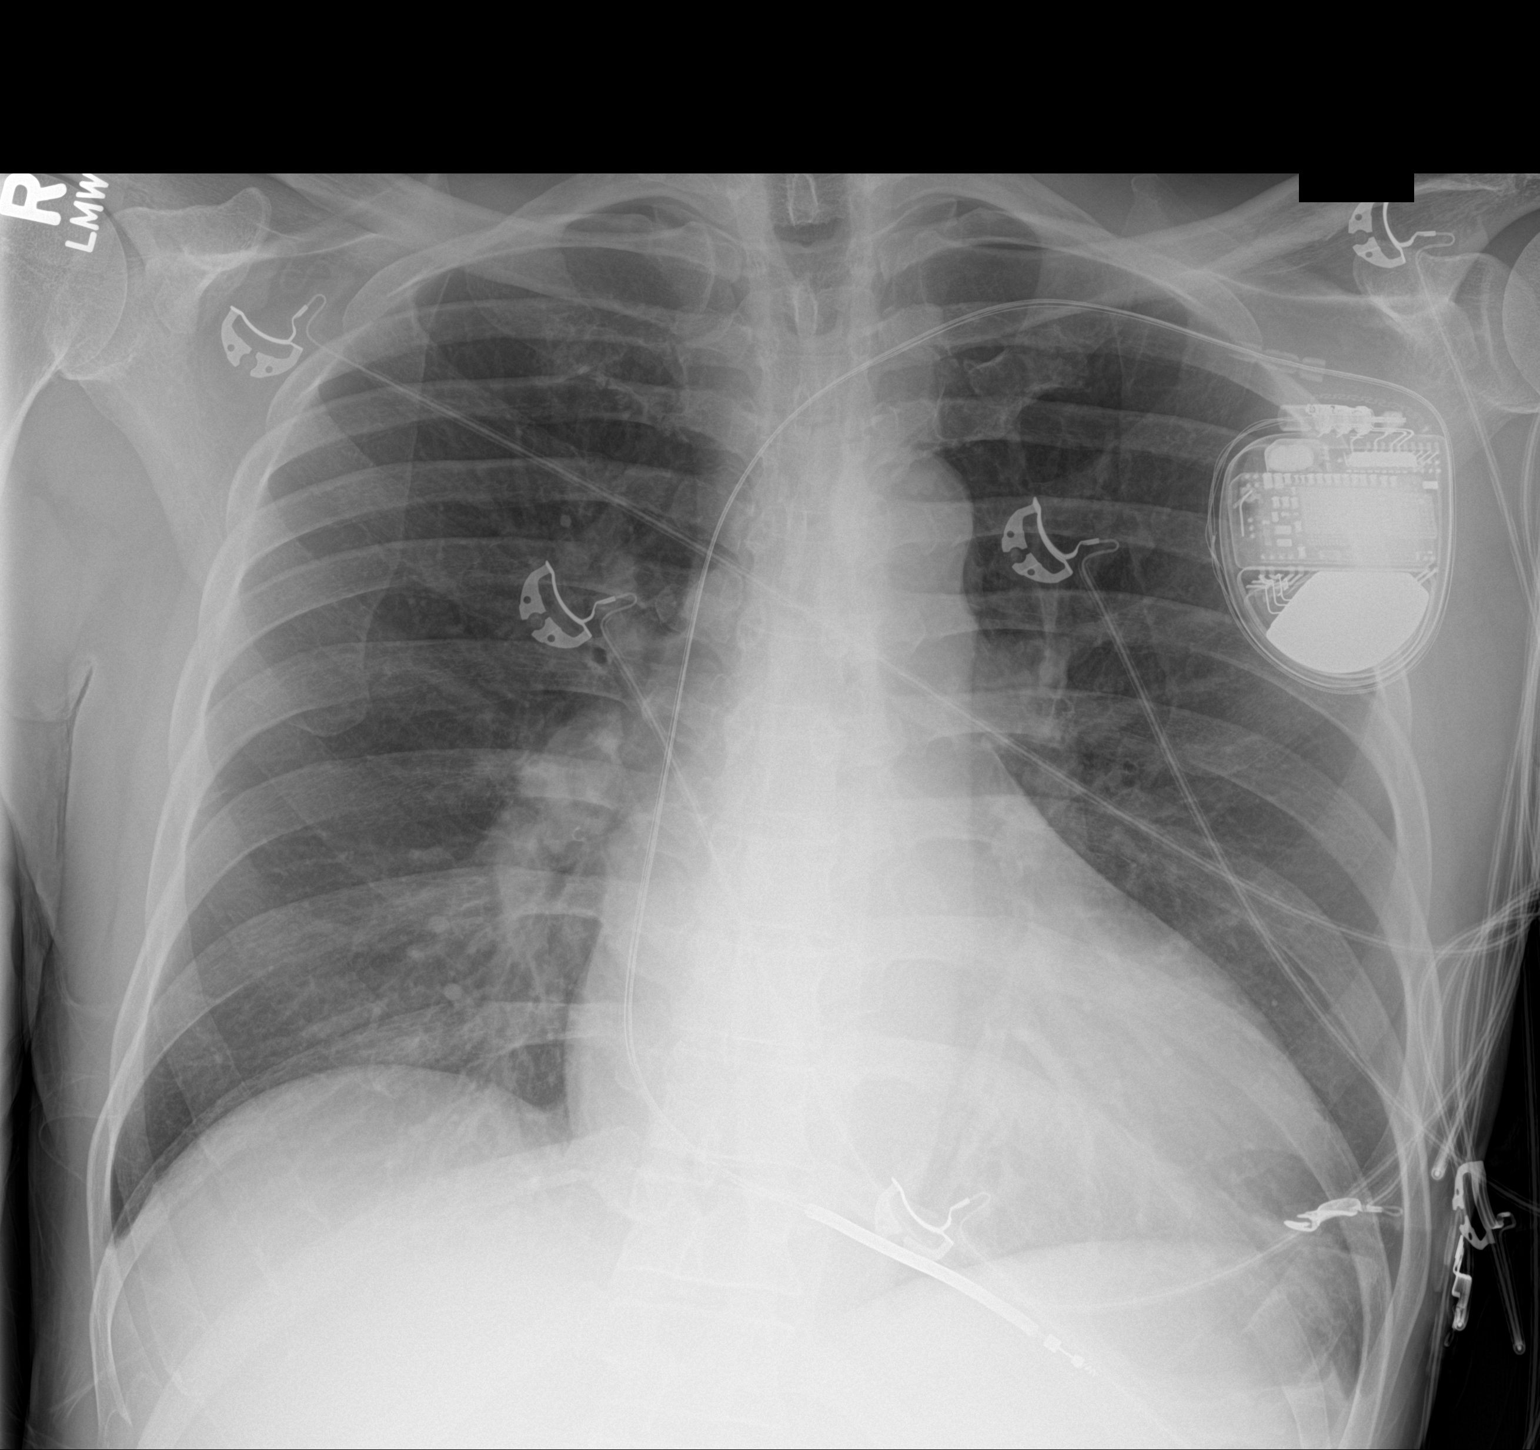

[1 of 1 positions shown; findings below may reference images not displayed]

FINDINGS: A single lead ICD remains in place. The cardiac silhouette remains
mildly enlarged. Minimal streaky opacity in the left lung base and
in the right infrahilar region is similar to the prior study. No
lobar consolidation, edema, sizable pleural effusion, or
pneumothorax is identified. No acute osseous abnormality is seen.
IMPRESSION: Minimal bibasilar opacity, likely atelectasis.

## 2017-09-10 MED ORDER — ALLOPURINOL 100 MG PO TABS: 100.0000 mg | ORAL_TABLET | Freq: Every day | ORAL | 3 refills | Status: DC

## 2017-09-11 ENCOUNTER — Encounter: Payer: Self-pay | Admitting: Pharmacy Technician

## 2017-09-11 ENCOUNTER — Telehealth (HOSPITAL_COMMUNITY): Payer: Self-pay | Admitting: Surgery

## 2017-09-11 NOTE — Telephone Encounter (Signed)
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  I called to follow-up with Kelly Leonard and he tells me that he was recently told that his uric acid was elevated and he had gout.  He completed Prednisone dose.  He then had swelling in legs and feet return.  He is concerned that this represents HF and is not associated with gout.  He denies SOB-however he requests an appt.in the HF clinic.  I have scheduled an appt for January 8th at 0900.

## 2017-09-16 ENCOUNTER — Ambulatory Visit (HOSPITAL_COMMUNITY)
Admission: RE | Admit: 2017-09-16 | Discharge: 2017-09-16 | Disposition: A | Discharge status: Home / Self Care | Payer: BLUE CROSS/BLUE SHIELD | Source: Ambulatory Visit | Attending: Cardiology | Admitting: Cardiology

## 2017-09-16 VITALS — BP 124/78 | HR 92 | Wt 145.2 lb

## 2017-09-16 DIAGNOSIS — M109 Gout, unspecified: Secondary | ICD-10-CM | POA: Diagnosis not present

## 2017-09-16 DIAGNOSIS — B1920 Unspecified viral hepatitis C without hepatic coma: Secondary | ICD-10-CM | POA: Diagnosis not present

## 2017-09-16 DIAGNOSIS — N5089 Other specified disorders of the male genital organs: Secondary | ICD-10-CM | POA: Diagnosis not present

## 2017-09-16 DIAGNOSIS — Z87440 Personal history of urinary (tract) infections: Secondary | ICD-10-CM | POA: Diagnosis not present

## 2017-09-16 DIAGNOSIS — Z880 Allergy status to penicillin: Secondary | ICD-10-CM | POA: Insufficient documentation

## 2017-09-16 DIAGNOSIS — I420 Dilated cardiomyopathy: Secondary | ICD-10-CM | POA: Insufficient documentation

## 2017-09-16 DIAGNOSIS — I5023 Acute on chronic systolic (congestive) heart failure: Secondary | ICD-10-CM | POA: Diagnosis not present

## 2017-09-16 DIAGNOSIS — J449 Chronic obstructive pulmonary disease, unspecified: Secondary | ICD-10-CM | POA: Insufficient documentation

## 2017-09-16 DIAGNOSIS — I130 Hypertensive heart and chronic kidney disease with heart failure and stage 1 through stage 4 chronic kidney disease, or unspecified chronic kidney disease: Secondary | ICD-10-CM | POA: Diagnosis not present

## 2017-09-16 DIAGNOSIS — Z888 Allergy status to other drugs, medicaments and biological substances status: Secondary | ICD-10-CM | POA: Diagnosis not present

## 2017-09-16 DIAGNOSIS — R21 Rash and other nonspecific skin eruption: Secondary | ICD-10-CM | POA: Diagnosis not present

## 2017-09-16 DIAGNOSIS — I252 Old myocardial infarction: Secondary | ICD-10-CM | POA: Diagnosis not present

## 2017-09-16 DIAGNOSIS — F419 Anxiety disorder, unspecified: Secondary | ICD-10-CM | POA: Diagnosis not present

## 2017-09-16 DIAGNOSIS — Z833 Family history of diabetes mellitus: Secondary | ICD-10-CM | POA: Diagnosis not present

## 2017-09-16 DIAGNOSIS — E785 Hyperlipidemia, unspecified: Secondary | ICD-10-CM | POA: Insufficient documentation

## 2017-09-16 DIAGNOSIS — N184 Chronic kidney disease, stage 4 (severe): Secondary | ICD-10-CM | POA: Diagnosis not present

## 2017-09-16 DIAGNOSIS — N179 Acute kidney failure, unspecified: Secondary | ICD-10-CM | POA: Insufficient documentation

## 2017-09-16 DIAGNOSIS — Z8249 Family history of ischemic heart disease and other diseases of the circulatory system: Secondary | ICD-10-CM | POA: Diagnosis not present

## 2017-09-16 DIAGNOSIS — I5022 Chronic systolic (congestive) heart failure: Secondary | ICD-10-CM

## 2017-09-16 DIAGNOSIS — Z79899 Other long term (current) drug therapy: Secondary | ICD-10-CM | POA: Insufficient documentation

## 2017-09-16 DIAGNOSIS — Q613 Polycystic kidney, unspecified: Secondary | ICD-10-CM | POA: Diagnosis not present

## 2017-09-16 LAB — BRAIN NATRIURETIC PEPTIDE: B NATRIURETIC PEPTIDE 5: 1605.8 pg/mL — AB (ref 0.0–100.0)

## 2017-09-16 LAB — CBC
HCT: 30 % — ABNORMAL LOW (ref 39.0–52.0)
Hemoglobin: 9 g/dL — ABNORMAL LOW (ref 13.0–17.0)
MCH: 22.8 pg — ABNORMAL LOW (ref 26.0–34.0)
MCHC: 30 g/dL (ref 30.0–36.0)
MCV: 75.9 fL — ABNORMAL LOW (ref 78.0–100.0)
PLATELETS: 303 10*3/uL (ref 150–400)
RBC: 3.95 MIL/uL — ABNORMAL LOW (ref 4.22–5.81)
RDW: 18.2 % — AB (ref 11.5–15.5)
WBC: 6.4 10*3/uL (ref 4.0–10.5)

## 2017-09-16 LAB — BASIC METABOLIC PANEL
Anion gap: 13 (ref 5–15)
BUN: 70 mg/dL — AB (ref 6–20)
CO2: 32 mmol/L (ref 22–32)
Calcium: 9.3 mg/dL (ref 8.9–10.3)
Chloride: 93 mmol/L — ABNORMAL LOW (ref 101–111)
Creatinine, Ser: 3.5 mg/dL — ABNORMAL HIGH (ref 0.61–1.24)
GFR calc Af Amer: 20 mL/min — ABNORMAL LOW (ref >60)
GFR, EST NON AFRICAN AMERICAN: 18 mL/min — AB (ref >60)
GLUCOSE: 122 mg/dL — AB (ref 65–99)
POTASSIUM: 3.5 mmol/L (ref 3.5–5.1)
SODIUM: 138 mmol/L (ref 135–145)

## 2017-09-16 MED ORDER — FUROSEMIDE 10 MG/ML IJ SOLN
80.0000 mg | Freq: Once | INTRAMUSCULAR | Status: AC
  Administered 2017-09-16: 80 mg via INTRAVENOUS
  Filled 2017-09-16: qty 8

## 2017-09-16 MED ORDER — METOLAZONE 2.5 MG PO TABS: 2.5000 mg | ORAL_TABLET | ORAL | 0 refills | Status: DC

## 2017-09-16 MED ORDER — PREDNISONE 10 MG (21) PO TBPK: ORAL_TABLET | ORAL | 0 refills | Status: DC

## 2017-09-16 NOTE — Progress Notes (Signed)
22g PIV inserted to LAFA x 1 attempt. 80 mg IV lasix administered over 4 minutes. Saline locked. Patient tolerated well.  TOTAL UOP: 375 clear yellow urine  PIV removed and clean dry gauze dressing applied to site before patient discharged from OV.  Ave Filter, RN

## 2017-09-16 NOTE — Patient Instructions (Addendum)
INCREASE Bumex to 3 tabs TWICE DAILY for 3 days only, then reduce back to normal daily dose of 3 tabs once daily.  Take metolazone 2.5 mg tablet once daily for two days only. You will have extra tablets left over, do NOT take these until you consult CHF clinic first.  Take 40 meq (2 tabs) potassium once today. Take 20 meq (1 tab) once tomorrow and Thursday only.  Take prednisone packet as directed for back rash.  Will refer you to dermatology for rash.  Take all medication as prescribed the day of your appointment. Bring all medications with you to your appointment.  Do the following things EVERYDAY: 1) Weigh yourself in the morning before breakfast. Write it down and keep it in a log. 2) Take your medicines as prescribed 3) Eat low salt foods-Limit salt (sodium) to 2000 mg per day.  4) Stay as active as you can everyday 5) Limit all fluids for the day to less than 2 liters

## 2017-09-16 NOTE — Progress Notes (Signed)
Advanced Heart Failure Clinic Note  Primary Physician: Gabriel Cirri NP  Primary Cardiologist:  Horald Chestnut Nephrology: Dr Glenna Fellows  Primary HF: Dr. Gala Romney   HPI: Kelly Leonard is a 62 y.o. 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    Patient admitted to [facility] in 10/18 with low output HF and AKI. Transferred to [facility] for further management.  On arrival to [facility], swan placed (07/07/17) which showed low output physiology. RA = 12 PA =   48/22 (32) PCW = 14 Thermo cardiac output/index = 4.3/2.4 Fick CO/CI = 2.7/1.6 Ao sat = 96% PA sat = 41%  Started on dobutamine. HF meds optimized. Dobutamine stopped since 07/13/17. Creatinine peaked and 4.14 and improved to 2.5 with dobutamine  Hospital course complicated by severe anxiety and seen by Psych who adjusted meds with excellent improvement.  Hepatitis C diagnosis as well as UTI. Plan for ID follow up as outpatient. Pt completed course of levaquin for UTI. D/c weight 139.  Pt returns today for add-on due to worsening edema. Complaining of orthopnea and PND. Still able to do ADLs. Taking bumex 3mg  daily and occasionally 3mg  bid. Was seen in ED last week for worsening swelling and pain.  Sleeping terribly due to pain and itching on back. He states the rash on his back came with allopurinol after given at a previous admission and has not really improved. Flared up again after given hydrocodone for foot pain last week. . Weight at home 140-142 lbs (Feels best closer to 135). Denies fevers or chills.  Also having testicular swelling.   Review of systems complete and found to be negative unless listed in HPI.    Past Medical History:  Diagnosis Date  . Anxiety   . CHF (congestive heart failure) (HCC)   . Chronic systolic congestive heart failure (HCC)   . CKD (chronic kidney disease) stage 4, GFR 15-29 ml/min (HCC)   . COPD (chronic  obstructive pulmonary disease) (HCC)   . Dilated cardiomyopathy (HCC)   . Hyperlipidemia   . Hypertension   . MI (myocardial infarction) (HCC)   . Polycystic kidney   . Renal disorder     Current Outpatient Medications  Medication Sig Dispense Refill  . bumetanide (BUMEX) 1 MG tablet Take 3 tablets (3 mg total) daily by mouth. May take additional 3 mg in the PM as needed for swelling/SOB 90 tablet 3  . busPIRone (BUSPAR) 5 MG tablet Take 1 tablet (5 mg total) 3 (three) times daily by mouth. 90 tablet 3  . Glecaprevir-Pibrentasvir (MAVYRET) 100-40 MG TABS Take 3 tablets by mouth daily with breakfast. 84 tablet 2  . isosorbide-hydrALAZINE (BIDIL) 20-37.5 MG tablet Take 2 tablets 3 (three) times daily by mouth.    . Melatonin 10 MG CAPS Take 10 mg by mouth at bedtime.    Marland Kitchen oxyCODONE-acetaminophen (ROXICET) 5-325 MG tablet Take 1 tablet by mouth every 4 (four) hours as needed for severe pain. 20 tablet 0   Current Facility-Administered Medications  Medication Dose Route Frequency Provider Last Rate Last Dose  . furosemide (LASIX) injection 80 mg  80 mg Intravenous Once Graciella Freer, PA-C       Allergies  Allergen Reactions  . Entresto [Sacubitril-Valsartan] Swelling    Swelling of the face  . Penicillins Anaphylaxis and Swelling    Has patient had a PCN reaction causing immediate rash, facial/tongue/throat swelling, SOB or lightheadedness with hypotension:  Yes Has patient had a PCN reaction causing severe rash involving mucus membranes or skin necrosis: No Has patient had a PCN reaction that required hospitalization: No Has patient had a PCN reaction occurring within the last 10 years: No If all of the above answers are "NO", then may proceed with Cephalosporin use.   . Seroquel [Quetiapine] Nausea And Vomiting and Hives    Hives   . Ace Inhibitors Rash  . Corlanor [Ivabradine] Swelling    Facial swelling  . Ambien [Zolpidem Tartrate] Swelling  . Carvedilol Other (See  Comments)    "dizziness, light headed, and nausea"  . Hydroxyzine Nausea Only  . Sertraline Diarrhea    itching  . Trazodone And Nefazodone Swelling and Other (See Comments)    "up for days" and lip swelling  . Benadryl [Diphenhydramine Hcl] Other (See Comments)    "makes me crazy and hyper"  . Gabapentin Swelling    Lip and face swelling  . Lorazepam Rash    Rash and severe anxiety  . Torsemide Hives, Itching, Nausea And Vomiting and Rash    Social History   Socioeconomic History  . Marital status: Single    Spouse name: Not on file  . Number of children: Not on file  . Years of education: Not on file  . Highest education level: Not on file  Social Needs  . Financial resource strain: Not on file  . Food insecurity - worry: Not on file  . Food insecurity - inability: Not on file  . Transportation needs - medical: Not on file  . Transportation needs - non-medical: Not on file  Occupational History  . Not on file  Tobacco Use  . Smoking status: Never Smoker  . Smokeless tobacco: Never Used  Substance and Sexual Activity  . Alcohol use: No  . Drug use: No  . Sexual activity: Yes  Other Topics Concern  . Not on file  Social History Narrative  . Not on file    Family History  Problem Relation Age of Onset  . Diabetes Mother   . Hypertension Brother   . Heart disease Maternal Grandmother   . Heart attack Maternal Grandmother    Vitals:   09/16/17 0909  BP: 124/78  Pulse: 92  SpO2: 99%  Weight: 145 lb 4 oz (65.9 kg)    Wt Readings from Last 3 Encounters:  09/16/17 145 lb 4 oz (65.9 kg)  09/09/17 140 lb (63.5 kg)  08/27/17 140 lb 9.6 oz (63.8 kg)    PHYSICAL EXAM: General: Thin. NAD.  Neck: Supple. JVP at least 9-10 cm with mild HJR. Carotids 2+ bilat; no bruits. No thyromegaly or nodule noted. Cor: PMI laterally displaced. RRR, 2/6 MR. ? S3 Lungs: CTAB, normal effort. Abdomen: Soft, non-tender, non-distended, no HSM. No bruits or masses. +BS   Extremities: No cyanosis, clubbing, or rash. R and LLE 2-3+ edema to knees  Neuro: Alert & orientedx3, cranial nerves grossly intact. moves all 4 extremities w/o difficulty. Affect pleasant  GU: Testicular edema.  Skin: diffuse erythematous rash on back. Macular papular with questionable areas of urticaria. + excoriations from scratching.   ASSESSMENT & PLAN: 1. Acute on chronic systolic Heart Failure - NICM. ECHO EF 05/2017 EF 20-25%.  - Volume status at least moderately overloaded on examine with significant peripheral edema. Will give dose of IV lasix 80 mg in clinic.  - Increase bumex to 3 mg BID x 3 days. BMET today relatively stable.  - Give metolazone 2.5 mg  once x 2 days.  - ? Cardio renal component with previous low output.  - No Spiro/ACE/ARB/ARNI with CKD IV.  - No BB yet with low output.  - Allergic to entresto, carvedilol, torsemide, ace.  - Back on Bidil 2 tabs TID without difficulty.  2. AKI on CKD stage IV in setting of polycystic KD - Creatinine baseline 2.8-3.0 in setting of PCKD and HF  - Creatinine 3.89 on 09/09/17.  - Stat BMET today with Creatinine 3.5 and BUN 70. Will follow up closely.  3. Anxiety  - Severe. Continue home meds.  4. HCV - On Mavyret per ID. No change. 5. Gout  - On going.  - Unable to take allopurinol due to rash.  - Prednisone dose pack as below.  6. Rash on back - Unclear trigger but waxing and waning over past month. Appears to be a drug rash.  - Reviewed potential household triggers with Wife. She has recently purchased new detergent and dryer sheets. She will stop these. - Reviewed med list with Pharm-D at length.  - Will provide Prednisone dose pack and refer to dermatology.  - He refuses benadryl or hydroxyzine due to reactions in the past.   Meds as above. IV lasix given in clinic with 375 cc of clear urine. Requested to come go home. Will follow up closely this week.   Graciella Freer, PA-C 09/16/17   Patient seen and  examined with the above-signed Advanced Practice Provider and/or Housestaff. I personally reviewed laboratory data, imaging studies and relevant notes. I independently examined the patient and formulated the important aspects of the plan. I have edited the note to reflect any of my changes or salient points. I have personally discussed the plan with the patient and/or family.  He is volume overloaded with NYHA III symptoms. We gave dose of IV lasix in clinic with excellent response. Will double bumex to 3 bid for next 2 days and give metolazone for 2 days. We will see him back on Friday and if no improvement will admit for IV diuresis and repeat RHC. Renal function relatively stable at 3.5. Rash on back concerning for drug rash but he has worked with PharmD to identify culprit but this has been unsuccessful. Wife says that she has changed detergent recently. She will change back and use hypoallergenic products. Start steroid taped. Refer to Dermatology.   Total time spent 45 minutes. Over half that time spent discussing above.   Arvilla Meres, MD  8:30 PM

## 2017-09-18 ENCOUNTER — Encounter: Payer: Self-pay | Admitting: Internal Medicine

## 2017-09-18 ENCOUNTER — Ambulatory Visit (INDEPENDENT_AMBULATORY_CARE_PROVIDER_SITE_OTHER): Payer: BLUE CROSS/BLUE SHIELD | Admitting: Internal Medicine

## 2017-09-18 VITALS — BP 120/84 | HR 96 | Ht 69.0 in | Wt 141.0 lb

## 2017-09-18 DIAGNOSIS — Z95810 Presence of automatic (implantable) cardiac defibrillator: Secondary | ICD-10-CM | POA: Diagnosis not present

## 2017-09-18 DIAGNOSIS — I5022 Chronic systolic (congestive) heart failure: Secondary | ICD-10-CM | POA: Diagnosis not present

## 2017-09-18 DIAGNOSIS — I420 Dilated cardiomyopathy: Secondary | ICD-10-CM

## 2017-09-18 NOTE — Progress Notes (Signed)
ELECTROPHYSIOLOGY CONSULT NOTE  Patient ID: Kelly Leonard, MRN: [id number], DOB/AGE: [birth year] 62 y.o. Admit date: (Not on file) Date of Consult: 09/18/2017  Primary Physician: Gabriel Cirri, NP Primary Cardiologist: DB     Kelly Leonard is a 62 y.o. male who is being seen today for the evaluation of ICD  at the request of Dr DB.    HPI Kelly Leonard is a 62 y.o. 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  He has worsening renal function and volume overload with a question of cardiorenal component.  He was recently hospitalized for dobutamine supported diuresis and low output heart failure.  He was recently seen in the ER again because of acute cardiac congestion.  He has an ICD implanted in 2016 by Dr. Merleen Milliner.  It has never gone off.  It is not been followed.   DATE TEST    9/18 Echo    EF 20-25 %   10/18 Echo    EF 20-25 %  LAE mod/severe MR        Date Cr K  1/19 3.50 3.5  /        Past Medical History:  Diagnosis Date  . Anxiety   . CHF (congestive heart failure) (HCC)   . Chronic systolic congestive heart failure (HCC)   . CKD (chronic kidney disease) stage 4, GFR 15-29 ml/min (HCC)   . COPD (chronic obstructive pulmonary disease) (HCC)   . Dilated cardiomyopathy (HCC)   . Hyperlipidemia   . Hypertension   . MI (myocardial infarction) (HCC)   . Polycystic kidney   . Renal disorder       Surgical History:  Past Surgical History:  Procedure Laterality Date  . CARDIAC CATHETERIZATION    . IMPLANTABLE CARDIOVERTER DEFIBRILLATOR (ICD) GENERATOR CHANGE Right 06/23/2015   Procedure: ICD GENERATOR  INITIAL IMPLANT;  Surgeon: Sharion Settler, MD;  Location: [facility] ORS;  Service: Cardiovascular;  Laterality: Right;     Home Meds: Prior to Admission medications   Medication Sig Start Date End Date Taking? Authorizing Provider  bumetanide (BUMEX) 1 MG tablet Take 3 tablets (3 mg total) daily by  mouth. May take additional 3 mg in the PM as needed for swelling/SOB 07/22/17   Graciella Freer, PA-C  busPIRone (BUSPAR) 5 MG tablet Take 1 tablet (5 mg total) 3 (three) times daily by mouth. 07/15/17   Graciella Freer, PA-C  Glecaprevir-Pibrentasvir (MAVYRET) 100-40 MG TABS Take 3 tablets by mouth daily with breakfast. 08/27/17   Kuppelweiser, Cassie L, RPH-CPP  isosorbide-hydrALAZINE (BIDIL) 20-37.5 MG tablet Take 2 tablets 3 (three) times daily by mouth.    [provider]  Melatonin 10 MG CAPS Take 10 mg by mouth at bedtime.    [provider]  metolazone (ZAROXOLYN) 2.5 MG tablet Take 1 tablet (2.5 mg total) by mouth as directed. 09/16/17 12/15/17  Graciella Freer, PA-C  oxyCODONE-acetaminophen (ROXICET) 5-325 MG tablet Take 1 tablet by mouth every 4 (four) hours as needed for severe pain. 09/09/17   Darci Current, MD  predniSONE (STERAPRED UNI-PAK 21 TAB) 10 MG (21) TBPK tablet Take as directed 09/16/17   Graciella Freer, PA-C    Allergies:  Allergies  Allergen Reactions  . Entresto [Sacubitril-Valsartan] Swelling    Swelling of the face  . Penicillins Anaphylaxis and Swelling    Has patient had a PCN reaction causing immediate rash, facial/tongue/throat swelling, SOB or lightheadedness with hypotension: Yes  Has patient had a PCN reaction causing severe rash involving mucus membranes or skin necrosis: No Has patient had a PCN reaction that required hospitalization: No Has patient had a PCN reaction occurring within the last 10 years: No If all of the above answers are "NO", then may proceed with Cephalosporin use.   . Seroquel [Quetiapine] Nausea And Vomiting and Hives    Hives   . Ace Inhibitors Rash  . Corlanor [Ivabradine] Swelling    Facial swelling  . Ambien [Zolpidem Tartrate] Swelling  . Carvedilol Other (See Comments)    "dizziness, light headed, and nausea"  . Hydroxyzine Nausea Only  . Sertraline Diarrhea    itching  .  Trazodone And Nefazodone Swelling and Other (See Comments)    "up for days" and lip swelling  . Benadryl [Diphenhydramine Hcl] Other (See Comments)    "makes me crazy and hyper"  . Gabapentin Swelling    Lip and face swelling  . Lorazepam Rash    Rash and severe anxiety  . Torsemide Hives, Itching, Nausea And Vomiting and Rash    Social History   Socioeconomic History  . Marital status: Single    Spouse name: Not on file  . Number of children: Not on file  . Years of education: Not on file  . Highest education level: Not on file  Social Needs  . Financial resource strain: Not on file  . Food insecurity - worry: Not on file  . Food insecurity - inability: Not on file  . Transportation needs - medical: Not on file  . Transportation needs - non-medical: Not on file  Occupational History  . Not on file  Tobacco Use  . Smoking status: Never Smoker  . Smokeless tobacco: Never Used  Substance and Sexual Activity  . Alcohol use: No  . Drug use: No  . Sexual activity: Yes  Other Topics Concern  . Not on file  Social History Narrative  . Not on file     Family History  Problem Relation Age of Onset  . Diabetes Mother   . Hypertension Brother   . Heart disease Maternal Grandmother   . Heart attack Maternal Grandmother      ROS:  Please see the history of present illness.     All other systems reviewed and negative.    Physical Exam:  Blood pressure 120/84, pulse 96, height 5\' 9"  (1.753 m), weight 141 lb (64 kg). General: Well developed, well nourished male in no acute distress. Head: Normocephalic, atraumatic, sclera non-icteric, no xanthomas, nares are without discharge. EENT: normal  Lymph Nodes:  none Neck: Negative for carotid bruits. JVD 8+ HJR  back:without scoliosis kyphosis  Lungs: Clear bilaterally to auscultation without wheezes, rales, or rhonchi. Breathing is unlabored. Heart: RRR with S1 S2.  2/6 systolic murmur . No rubs, or gallops  appreciated. Abdomen: Soft, non-tender, non-distended with normoactive bowel sounds. No hepatomegaly. No rebound/guarding. No obvious abdominal masses. Msk:  Strength and tone appear normal for age. Extremities: No clubbing or cyanosis.  2+  edema.  Distal pedal pulses are 2+ and equal bilaterally. Skin: Warm and Dry Neuro: Alert and oriented X 3. CN III-XII intact Grossly normal sensory and motor function . Psych:  Responds to questions appropriately with a normal affect.      Labs: Cardiac Enzymes No results for input(s): CKTOTAL, CKMB, TROPONINI in the last 72 hours. CBC Lab Results  Component Value Date   WBC 6.4 09/16/2017   HGB 9.0 (L) 09/16/2017  HCT 30.0 (L) 09/16/2017   MCV 75.9 (L) 09/16/2017   PLT 303 09/16/2017   PROTIME: No results for input(s): LABPROT, INR in the last 72 hours. Chemistry  Recent Labs  Lab 09/16/17 0941  NA 138  K 3.5  CL 93*  CO2 32  BUN 70*  CREATININE 3.50*  CALCIUM 9.3  GLUCOSE 122*   Lipids Lab Results  Component Value Date   CHOL 201 (H) 02/12/2017   HDL 63 02/12/2017   LDLCALC 120 (H) 02/12/2017   TRIG 88 02/12/2017   BNP No results found for: PROBNP Thyroid Function Tests: No results for input(s): TSH, T4TOTAL, T3FREE, THYROIDAB in the last 72 hours.  Invalid input(s): FREET3 Miscellaneous No results found for: DDIMER  Radiology/Studies:  Dg Chest 2 View  Result Date: 09/09/2017 CLINICAL DATA:  Acute onset of bilateral foot and leg swelling. Shortness of breath. EXAM: CHEST  2 VIEW COMPARISON:  Chest radiograph performed 07/07/2017 FINDINGS: The lungs are well-aerated. Pulmonary vascularity is at the upper limits of normal. There is no evidence of focal opacification, pleural effusion or pneumothorax. The heart is mildly enlarged. An AICD is noted at the left chest wall, with a single lead ending at the right ventricle. No acute osseous abnormalities are seen. IMPRESSION: No acute cardiopulmonary process seen.  Mild  cardiomegaly. Electronically Signed   By: Roanna Raider M.D.   On: 09/09/2017 02:17    EKG: 09/10/17 sinus rhythm at 87 19/09/42  LVH with repolarization abnormalities Axis left 4-48   Assessment and Plan:  Implantable defibrillator-[employer]-single-chamber  Nonischemic cardiomyopathy  Acute/chronic renal insufficiency baseline grade 4  Acute/chronic heart failure systolic  Sinus tachycardia-relative  Hepatitis C on chronic suppression    The patient's prognosis is quite poor.  Defibrillator is working adequately.  He has been lost to follow-up.  We will plan to have him establish for remote follow-up.  He will need a cell adapter.  The heart failure is being closely managed by the heart failure clinic.  Efforts to use corlanor failed.  Unfortunately.  Called and discussed with Dr Dorthea Cove         Sherryl Manges

## 2017-09-18 NOTE — Patient Instructions (Signed)
Medication Instructions: - Your physician recommends that you continue on your current medications as directed. Please refer to the Current Medication list given to you today.  Labwork: - none ordered  Procedures/Testing: - none ordered  Follow-Up: - Your physician wants you to follow-up in: 6 months with [facility] & 1 year with Dr. Klein. You will receive a reminder letter in the mail two months in advance. If you don't receive a letter, please call our office to schedule the follow-up appointment.   Any Additional Special Instructions Will Be Listed Below (If Applicable).     If you need a refill on your cardiac medications before your next appointment, please call your pharmacy.   

## 2017-09-19 ENCOUNTER — Ambulatory Visit (HOSPITAL_COMMUNITY)
Admission: RE | Admit: 2017-09-19 | Discharge: 2017-09-19 | Disposition: A | Discharge status: Home / Self Care | Payer: BLUE CROSS/BLUE SHIELD | Source: Ambulatory Visit | Attending: Cardiology | Admitting: Cardiology

## 2017-09-19 VITALS — BP 122/68 | HR 100 | Wt 142.4 lb

## 2017-09-19 DIAGNOSIS — Z880 Allergy status to penicillin: Secondary | ICD-10-CM | POA: Insufficient documentation

## 2017-09-19 DIAGNOSIS — N390 Urinary tract infection, site not specified: Secondary | ICD-10-CM | POA: Diagnosis not present

## 2017-09-19 DIAGNOSIS — I10 Essential (primary) hypertension: Secondary | ICD-10-CM | POA: Diagnosis not present

## 2017-09-19 DIAGNOSIS — F419 Anxiety disorder, unspecified: Secondary | ICD-10-CM | POA: Diagnosis not present

## 2017-09-19 DIAGNOSIS — N184 Chronic kidney disease, stage 4 (severe): Secondary | ICD-10-CM | POA: Insufficient documentation

## 2017-09-19 DIAGNOSIS — Z79899 Other long term (current) drug therapy: Secondary | ICD-10-CM | POA: Diagnosis not present

## 2017-09-19 DIAGNOSIS — Q613 Polycystic kidney, unspecified: Secondary | ICD-10-CM | POA: Insufficient documentation

## 2017-09-19 DIAGNOSIS — M109 Gout, unspecified: Secondary | ICD-10-CM | POA: Insufficient documentation

## 2017-09-19 DIAGNOSIS — I5022 Chronic systolic (congestive) heart failure: Secondary | ICD-10-CM | POA: Diagnosis not present

## 2017-09-19 DIAGNOSIS — I420 Dilated cardiomyopathy: Secondary | ICD-10-CM | POA: Insufficient documentation

## 2017-09-19 DIAGNOSIS — I252 Old myocardial infarction: Secondary | ICD-10-CM | POA: Insufficient documentation

## 2017-09-19 DIAGNOSIS — E785 Hyperlipidemia, unspecified: Secondary | ICD-10-CM | POA: Diagnosis not present

## 2017-09-19 DIAGNOSIS — I130 Hypertensive heart and chronic kidney disease with heart failure and stage 1 through stage 4 chronic kidney disease, or unspecified chronic kidney disease: Secondary | ICD-10-CM | POA: Diagnosis not present

## 2017-09-19 DIAGNOSIS — Z95811 Presence of heart assist device: Secondary | ICD-10-CM | POA: Insufficient documentation

## 2017-09-19 DIAGNOSIS — B1920 Unspecified viral hepatitis C without hepatic coma: Secondary | ICD-10-CM | POA: Diagnosis not present

## 2017-09-19 DIAGNOSIS — N179 Acute kidney failure, unspecified: Secondary | ICD-10-CM | POA: Diagnosis not present

## 2017-09-19 DIAGNOSIS — R21 Rash and other nonspecific skin eruption: Secondary | ICD-10-CM | POA: Diagnosis not present

## 2017-09-19 DIAGNOSIS — J449 Chronic obstructive pulmonary disease, unspecified: Secondary | ICD-10-CM | POA: Diagnosis not present

## 2017-09-19 LAB — BASIC METABOLIC PANEL
Anion gap: 14 (ref 5–15)
BUN: 81 mg/dL — AB (ref 6–20)
CALCIUM: 9.7 mg/dL (ref 8.9–10.3)
CO2: 38 mmol/L — ABNORMAL HIGH (ref 22–32)
Chloride: 84 mmol/L — ABNORMAL LOW (ref 101–111)
Creatinine, Ser: 3.34 mg/dL — ABNORMAL HIGH (ref 0.61–1.24)
GFR calc Af Amer: 22 mL/min — ABNORMAL LOW (ref >60)
GFR, EST NON AFRICAN AMERICAN: 19 mL/min — AB (ref >60)
Glucose, Bld: 126 mg/dL — ABNORMAL HIGH (ref 65–99)
Potassium: 3.2 mmol/L — ABNORMAL LOW (ref 3.5–5.1)
SODIUM: 136 mmol/L (ref 135–145)

## 2017-09-19 MED ORDER — POTASSIUM CHLORIDE CRYS ER 20 MEQ PO TBCR: 40.0000 meq | EXTENDED_RELEASE_TABLET | Freq: Once | ORAL | 3 refills | Status: DC

## 2017-09-19 NOTE — Progress Notes (Signed)
Advanced Heart Failure Clinic Note  Primary Physician: Gabriel Cirri NP  Primary Cardiologist:  Horald Chestnut Nephrology: Dr Glenna Fellows  Primary HF: Dr. Gala Romney   HPI: Kelly Leonard is a 62 y.o. 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    Patient admitted to [facility] in 10/18 with low output HF and AKI. Transferred to [facility] for further management.  On arrival to  [facility], swan placed (07/07/17) which showed low output physiology. RA = 12 PA =   48/22 (32) PCW = 14 Thermo cardiac output/index = 4.3/2.4 Fick CO/CI = 2.7/1.6 Ao sat = 96% PA sat = 41%  Started on dobutamine. HF meds optimized. Dobutamine stopped since 07/13/17. Creatinine peaked and 4.14 and improved to 2.5 with dobutamine  Hospital course complicated by severe anxiety and seen by Psych who adjusted meds with excellent improvement.  Hepatitis C diagnosis as well as UTI. Plan for ID follow up as outpatient. Pt completed course of levaquin for UTI. D/c weight 139.  Pt presents today for close follow up. Seen earlier this week with worsening edema and SOB.  Bumex doubled and metolazone added for several days.  Pt feeling much better. Breathing better. No further orthopnea or PND.  Weight down from 142 to 136 at home. (Baseline 134-136)   Peripheral edema much improved. Gout also resolving on burst prednisone. Testicular swelling resolved, and rash on back seems to be gradually improving.  Pt having difficulties with ED and would like to consider Viagra. He denies SOB with ADLs and feels like he is back to normal apart from mild gout, which as above is improving.   Review of systems complete and found to be negative unless listed in HPI.    Past Medical History:  Diagnosis Date  . Anxiety   . CHF (congestive heart failure) (HCC)   . Chronic systolic congestive heart failure (HCC)   . CKD (chronic kidney disease) stage 4, GFR  15-29 ml/min (HCC)   . COPD (chronic obstructive pulmonary disease) (HCC)   . Dilated cardiomyopathy (HCC)   . Hyperlipidemia   . Hypertension   . MI (myocardial infarction) (HCC)   . Polycystic kidney   . Renal disorder     Current Outpatient Medications  Medication Sig Dispense Refill  . bumetanide (BUMEX) 1 MG tablet Take 3 tablets (3 mg total) daily by mouth. May take additional 3 mg in the PM as needed for swelling/SOB 90 tablet 3  . busPIRone (BUSPAR) 5 MG tablet Take 1 tablet (5 mg total) 3 (three) times daily by mouth. 90 tablet 3  . Glecaprevir-Pibrentasvir (MAVYRET) 100-40 MG TABS Take 3 tablets by mouth daily with breakfast. 84 tablet 2  . isosorbide-hydrALAZINE (BIDIL) 20-37.5 MG tablet Take 2 tablets 3 (three) times daily by mouth.    . Melatonin 10 MG CAPS Take 10 mg by mouth at bedtime.    . metolazone (ZAROXOLYN) 2.5 MG tablet Take 1 tablet (2.5 mg total) by mouth as directed. 5 tablet 0  . oxyCODONE-acetaminophen (ROXICET) 5-325 MG tablet Take 1 tablet by mouth every 4 (four) hours as needed for severe pain. 20 tablet 0  . predniSONE (STERAPRED UNI-PAK 21 TAB) 10 MG (21) TBPK tablet Take as directed 21 tablet 0   No current facility-administered medications for this encounter.    Allergies  Allergen Reactions  . Entresto [Sacubitril-Valsartan] Swelling    Swelling of the face  . Penicillins Anaphylaxis and Swelling  Has patient had a PCN reaction causing immediate rash, facial/tongue/throat swelling, SOB or lightheadedness with hypotension: Yes Has patient had a PCN reaction causing severe rash involving mucus membranes or skin necrosis: No Has patient had a PCN reaction that required hospitalization: No Has patient had a PCN reaction occurring within the last 10 years: No If all of the above answers are "NO", then may proceed with Cephalosporin use.   . Seroquel [Quetiapine] Nausea And Vomiting and Hives    Hives   . Ace Inhibitors Rash  . Corlanor  [Ivabradine] Swelling    Facial swelling  . Ambien [Zolpidem Tartrate] Swelling  . Carvedilol Other (See Comments)    "dizziness, light headed, and nausea"  . Hydroxyzine Nausea Only  . Sertraline Diarrhea    itching  . Trazodone And Nefazodone Swelling and Other (See Comments)    "up for days" and lip swelling  . Benadryl [Diphenhydramine Hcl] Other (See Comments)    "makes me crazy and hyper"  . Gabapentin Swelling    Lip and face swelling  . Lorazepam Rash    Rash and severe anxiety  . Torsemide Hives, Itching, Nausea And Vomiting and Rash    Social History   Socioeconomic History  . Marital status: Single    Spouse name: Not on file  . Number of children: Not on file  . Years of education: Not on file  . Highest education level: Not on file  Social Needs  . Financial resource strain: Not on file  . Food insecurity - worry: Not on file  . Food insecurity - inability: Not on file  . Transportation needs - medical: Not on file  . Transportation needs - non-medical: Not on file  Occupational History  . Not on file  Tobacco Use  . Smoking status: Never Smoker  . Smokeless tobacco: Never Used  Substance and Sexual Activity  . Alcohol use: No  . Drug use: No  . Sexual activity: Yes  Other Topics Concern  . Not on file  Social History Narrative  . Not on file    Family History  Problem Relation Age of Onset  . Diabetes Mother   . Hypertension Brother   . Heart disease Maternal Grandmother   . Heart attack Maternal Grandmother    Vitals:   09/19/17 0939  BP: 122/68  Pulse: 100  SpO2: 98%  Weight: 142 lb 6 oz (64.6 kg)    Wt Readings from Last 3 Encounters:  09/19/17 142 lb 6 oz (64.6 kg)  09/18/17 141 lb (64 kg)  09/16/17 145 lb 4 oz (65.9 kg)    PHYSICAL EXAM: General: Well appearing. No resp difficulty. HEENT: Normal Neck: Supple. JVP ~7 cm. Carotids 2+ bilat; no bruits. No thyromegaly or nodule noted. Cor: PMI lateraly displaced. Tachy regular ,  2/6 MR. ?S3 Lungs: CTAB, normal effort. No wheeze  Abdomen: Soft, non-tender, non-distended, no HSM. No bruits or masses. +BS  Extremities: No cyanosis, clubbing, or rash. R and LLE 1-2 + edema R>L in ankles.  Neuro: Alert & orientedx3, cranial nerves grossly intact. moves all 4 extremities w/o difficulty. Affect pleasant  Skin: Diffuse erythematous rash on back has improved since last visit.   ASSESSMENT & PLAN: 1. Chronic systolic Heart Failure - NICM. ECHO EF 05/2017 EF 20-25%.  - Volume status much improved. Near baseline - Continue bumex 3 mg daily. Can take extra in evenings for Weight > 137.  - BMET today with creatinine stable.  - Continue metolazone as needed.  - ?  Cardio renal component with previous low output.  - No Spiro/ACE/ARB/ARNI with CKD IV.  - No BB yet with low output.  - Allergic to entresto, carvedilol, torsemide, ace.  - Back on Bidil 2 tabs TID without difficulty.  - Reinforced fluid restriction to < 2 L daily, sodium restriction to less than 2000 mg daily, and the importance of daily weights.   2. AKI on CKD stage IV in setting of polycystic KD - Creatinine baseline 2.8-3.0 in setting of PCKD and HF  - Creatinine improved slightly to 3.34 today with active diuresis.  Hope will continue to improve now back on stable po meds.  3. Anxiety  - Severe. Continue home meds. No change.  4. HCV - On Mavyret per ID. No change.  5. Gout  - Resolving on prednisone. Continue to finish dose pack.  - Unable to take allopurinol due to rash.  6. Rash on back - Improving on steroid dose pack.  - No clear trigger. But wife has reverted changes she had made to new detergent and dryer sheets. - Reviewed med list with Pharm-D at length.   Much improved with diuresis and prednisone taper. Labs relatively stable.   Graciella Freer, PA-C 09/19/17   Patient seen and examined with the above-signed Advanced Practice Provider and/or Housestaff. I personally reviewed  laboratory data, imaging studies and relevant notes. I independently examined the patient and formulated the important aspects of the plan. I have edited the note to reflect any of my changes or salient points. I have personally discussed the plan with the patient and/or family.  Volume status much improved with uptitration of diuretics. Breathing better. Goal weight now 134-137. .sldie Take extra bumex +/- metolazone for weight 138 or greater. Creatinine improved at 3.34. Rash improved with steroids. Hopefully will no recur with environmental changes that have been made.   Discussed with Dr. Graciela Husbands in EP. Wil hold off on ICD at this point given precarious prognosis. If stabilizes with consider S-ICD with likely need of dialysis in the future.   Arvilla Meres, MD  4:02 PM

## 2017-09-19 NOTE — Patient Instructions (Signed)
Take one dose (40 meq- two tabs) of Potassium today only.  Your physician recommends that you schedule a follow-up appointment as scheduled with Dr Gala Romney

## 2017-10-01 ENCOUNTER — Telehealth: Payer: Self-pay | Admitting: Cardiology

## 2017-10-01 NOTE — Telephone Encounter (Signed)
Received page from patient.  He is feeling some more shortness of breath upon exertion, and has had some increased abdominal swelling over the last couple of days.  He has been taking Bumex 3 mg twice daily over the last 2 days.  His weight is 138.  He has not taken a dose of metolazone in over a week.  Fortunately, he has still been able to walk about a mile a day, although he has to take some breaks to catch his breath intermittently.  At this point, he says he feels well enough to stay at home this evening.  He will plan to take 2.5 mg metolazone in addition to his Bumex dose tomorrow morning.  He will call the office with an update and to receive any further instructions.  I did counsel him that if he were to get more short of breath, have any chest pains, presyncope/palpitations, or any other symptoms that worried him, to present to the emergency department for further evaluation and management.  The patient was in agreement with the plan.  Esmond Plants, MD

## 2017-10-02 ENCOUNTER — Ambulatory Visit (INDEPENDENT_AMBULATORY_CARE_PROVIDER_SITE_OTHER): Payer: BLUE CROSS/BLUE SHIELD | Admitting: Pharmacist

## 2017-10-02 DIAGNOSIS — B182 Chronic viral hepatitis C: Secondary | ICD-10-CM

## 2017-10-02 NOTE — Progress Notes (Signed)
[facility] Pharmacy Visit  HPI: Kelly Leonard is a 62 y.o. male who presents to the [facility] pharmacy clinic for Hep C follow-up.  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  Patient Active Problem List   Diagnosis Date Noted  . Gout 09/19/2017  . Rash 09/16/2017  . Acute on chronic systolic heart failure (HCC) 07/07/2017  . Acute renal failure with acute tubular necrosis superimposed on stage 4 chronic kidney disease (HCC)   . Hyponatremia 07/05/2017  . Cramps, muscle, general 07/05/2017  . Weakness generalized 07/05/2017  . Acute exacerbation of CHF (congestive heart failure) (HCC) 06/24/2017  . Acute on chronic systolic (congestive) heart failure (HCC) 05/12/2017  . Insomnia 05/05/2017  . Advanced care planning/counseling discussion 03/24/2017  . 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 Hypertension   . Hyperlipidemia   . Chronic systolic congestive heart failure (HCC)   . Dilated cardiomyopathy (HCC)   . Polycystic kidney   . CKD (chronic kidney disease) stage 4, GFR 15-29 ml/min (HCC)   . 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 Anxiety disorder, unspecified 03/03/2013    Patient's Medications  New Prescriptions   No medications on file  Previous Medications   BUMETANIDE (BUMEX) 1 MG TABLET    Take 3 tablets (3 mg total) daily by mouth. May take additional 3 mg in the PM as needed for swelling/SOB   BUSPIRONE (BUSPAR) 5 MG TABLET    Take 1 tablet (5 mg total) 3 (three) times daily by mouth.   GLECAPREVIR-PIBRENTASVIR (MAVYRET) 100-40 MG TABS    Take 3 tablets by mouth daily with breakfast.   ISOSORBIDE-HYDRALAZINE (BIDIL) 20-37.5 MG TABLET    Take 2 tablets 3 (three) times daily by mouth.   MELATONIN 10 MG CAPS    Take 10 mg by mouth at bedtime.   METOLAZONE (ZAROXOLYN) 2.5 MG TABLET    Take 1 tablet (2.5 mg total) by mouth as directed.   OXYCODONE-ACETAMINOPHEN (ROXICET) 5-325 MG TABLET     Take 1 tablet by mouth every 4 (four) hours as needed for severe pain.   POTASSIUM CHLORIDE SA (K-DUR,KLOR-CON) 20 MEQ TABLET    Take 2 tablets (40 mEq total) by mouth once for 1 dose.   PREDNISONE (STERAPRED UNI-PAK 21 TAB) 10 MG (21) TBPK TABLET    Take as directed  Modified Medications   No medications on file  Discontinued Medications   No medications on file    Allergies: Allergies  Allergen Reactions  . Entresto [Sacubitril-Valsartan] Swelling    Swelling of the face  . Penicillins Anaphylaxis and Swelling    Has patient had a PCN reaction causing immediate rash, facial/tongue/throat swelling, SOB or lightheadedness with hypotension: Yes Has patient had a PCN reaction causing severe rash involving mucus membranes or skin necrosis: No Has patient had a PCN reaction that required hospitalization: No Has patient had a PCN reaction occurring within the last 10 years: No If all of the above answers are "NO", then may proceed with Cephalosporin use.   . Seroquel [Quetiapine] Nausea And Vomiting and Hives    Hives   . Ace Inhibitors Rash  . Corlanor [Ivabradine] Swelling    Facial swelling  . Ambien [Zolpidem Tartrate] Swelling  . Carvedilol Other (See Comments)    "dizziness, light headed, and nausea"  . Hydroxyzine Nausea Only  . Sertraline Diarrhea    itching  . Trazodone And Nefazodone Swelling and Other (See Comments)    "  up for days" and lip swelling  . Benadryl [Diphenhydramine Hcl] Other (See Comments)    "makes me crazy and hyper"  . Gabapentin Swelling    Lip and face swelling  . Lorazepam Rash    Rash and severe anxiety  . Torsemide Hives, Itching, Nausea And Vomiting and Rash    Past Medical History: Past Medical History:  Diagnosis Date  . Anxiety   . CHF (congestive heart failure) (HCC)   . Chronic systolic congestive heart failure (HCC)   . CKD (chronic kidney disease) stage 4, GFR 15-29 ml/min (HCC)   . COPD (chronic obstructive pulmonary disease)  (HCC)   . Dilated cardiomyopathy (HCC)   . Hyperlipidemia   . Hypertension   . MI (myocardial infarction) (HCC)   . Polycystic kidney   . Renal disorder     Social History: Social History   Socioeconomic History  . Marital status: Single    Spouse name: Not on file  . Number of children: Not on file  . Years of education: Not on file  . Highest education level: Not on file  Social Needs  . Financial resource strain: Not on file  . Food insecurity - worry: Not on file  . Food insecurity - inability: Not on file  . Transportation needs - medical: Not on file  . Transportation needs - non-medical: Not on file  Occupational History  . Not on file  Tobacco Use  . Smoking status: Never Smoker  . Smokeless tobacco: Never Used  Substance and Sexual Activity  . Alcohol use: No  . Drug use: No  . Sexual activity: Yes  Other Topics Concern  . Not on file  Social History Narrative  . Not on file    Labs: Hepatitis B Surface Ag (no units)  Date Value  07/08/2017 Negative   HCV Ab (s/co ratio)  Date Value  07/08/2017 >11.0 (H)    Lab Results  Component Value Date   HEPCGENOTYPE 1b 03/31/2017    Hepatitis C RNA quantitative Latest Ref Rng & Units 07/09/2017 04/11/2017  HCV Quantitative >50 IU/mL 1,290,000 1,600,000  HCV Quantitative Log >1.70 log10 IU/mL 6.111 6.204    AST (U/L)  Date Value  07/15/2017 65 (H)  07/07/2017 188 (H)  07/06/2017 146 (H)   SGOT(AST) (Unit/L)  Date Value  11/12/2012 25  08/01/2012 42 (H)   ALT (U/L)  Date Value  07/15/2017 50  07/07/2017 108 (H)  07/06/2017 79 (H)   SGPT (ALT) (U/L)  Date Value  11/12/2012 25  08/01/2012 41   INR (no units)  Date Value  07/07/2017 1.20  05/14/2017 1.15  06/12/2015 0.94    Fibrosis Score: F3/F4 as assessed by [facility]   Child-Pugh Score: A  Assessment: Kelly Leonard is here today to follow-up with me for his Hep C infection.  He started 12 weeks of Mavyret ~1/4.  He has ~6 days left in his  first pack.  He had a few headaches when he first started that quickly resolved and is feeling a little more tired than usual, but other than that, he is tolerating it well.  He takes all three pills together in the morning around 9am after breakfast.  He has missed no doses. I spent some time going over Hep C again with him and his wife. I discussed his fibrosis score with him again and answered his questions.  He used to drink alcohol quite often, but only has a glass of wine every once and awhile now.  I will get a Hep C viral load today and have him come back and see me again when he is done with therapy.  He will follow-up with Dr. Luciana Axe for his [facility] visit.   Plan: - Continue Mavyret x 12 weeks - Hep C viral load today - F/u with me again at [facility] on 3/25 at 9am  Cassie L. Kuppelweiser, PharmD, AAHIVP, CPP Infectious Diseases Clinical Pharmacist [facility] 10/02/2017, 11:17 AM

## 2017-10-03 ENCOUNTER — Other Ambulatory Visit: Payer: Self-pay

## 2017-10-03 ENCOUNTER — Emergency Department: Payer: BLUE CROSS/BLUE SHIELD

## 2017-10-03 ENCOUNTER — Encounter: Payer: Self-pay | Admitting: Emergency Medicine

## 2017-10-03 ENCOUNTER — Emergency Department
Admission: EM | Admit: 2017-10-03 | Discharge: 2017-10-03 | Disposition: A | Discharge status: Home / Self Care | Payer: BLUE CROSS/BLUE SHIELD | Attending: Emergency Medicine | Admitting: Emergency Medicine

## 2017-10-03 DIAGNOSIS — R0602 Shortness of breath: Secondary | ICD-10-CM | POA: Diagnosis present

## 2017-10-03 DIAGNOSIS — Z79899 Other long term (current) drug therapy: Secondary | ICD-10-CM | POA: Diagnosis not present

## 2017-10-03 DIAGNOSIS — I5023 Acute on chronic systolic (congestive) heart failure: Secondary | ICD-10-CM | POA: Insufficient documentation

## 2017-10-03 DIAGNOSIS — R7989 Other specified abnormal findings of blood chemistry: Secondary | ICD-10-CM | POA: Diagnosis not present

## 2017-10-03 DIAGNOSIS — R778 Other specified abnormalities of plasma proteins: Secondary | ICD-10-CM

## 2017-10-03 DIAGNOSIS — I130 Hypertensive heart and chronic kidney disease with heart failure and stage 1 through stage 4 chronic kidney disease, or unspecified chronic kidney disease: Secondary | ICD-10-CM | POA: Insufficient documentation

## 2017-10-03 DIAGNOSIS — I509 Heart failure, unspecified: Secondary | ICD-10-CM

## 2017-10-03 DIAGNOSIS — J449 Chronic obstructive pulmonary disease, unspecified: Secondary | ICD-10-CM | POA: Insufficient documentation

## 2017-10-03 DIAGNOSIS — N184 Chronic kidney disease, stage 4 (severe): Secondary | ICD-10-CM | POA: Insufficient documentation

## 2017-10-03 LAB — CBC WITH DIFFERENTIAL/PLATELET
Basophils Absolute: 0.1 10*3/uL (ref 0–0.1)
Basophils Relative: 2 %
EOS ABS: 0.2 10*3/uL (ref 0–0.7)
Eosinophils Relative: 3 %
HEMATOCRIT: 32.2 % — AB (ref 40.0–52.0)
HEMOGLOBIN: 10.1 g/dL — AB (ref 13.0–18.0)
LYMPHS ABS: 1.7 10*3/uL (ref 1.0–3.6)
LYMPHS PCT: 22 %
MCH: 23.4 pg — AB (ref 26.0–34.0)
MCHC: 31.5 g/dL — AB (ref 32.0–36.0)
MCV: 74.3 fL — AB (ref 80.0–100.0)
MONOS PCT: 9 %
Monocytes Absolute: 0.7 10*3/uL (ref 0.2–1.0)
NEUTROS ABS: 5.1 10*3/uL (ref 1.4–6.5)
NEUTROS PCT: 64 %
Platelets: 280 10*3/uL (ref 150–440)
RBC: 4.33 MIL/uL — AB (ref 4.40–5.90)
RDW: 19.2 % — ABNORMAL HIGH (ref 11.5–14.5)
WBC: 7.8 10*3/uL (ref 3.8–10.6)

## 2017-10-03 LAB — BASIC METABOLIC PANEL
Anion gap: 14 (ref 5–15)
BUN: 84 mg/dL — ABNORMAL HIGH (ref 6–20)
CHLORIDE: 95 mmol/L — AB (ref 101–111)
CO2: 28 mmol/L (ref 22–32)
CREATININE: 3.35 mg/dL — AB (ref 0.61–1.24)
Calcium: 9.3 mg/dL (ref 8.9–10.3)
GFR calc non Af Amer: 19 mL/min — ABNORMAL LOW (ref >60)
GFR, EST AFRICAN AMERICAN: 21 mL/min — AB (ref >60)
Glucose, Bld: 99 mg/dL (ref 65–99)
POTASSIUM: 3.3 mmol/L — AB (ref 3.5–5.1)
Sodium: 137 mmol/L (ref 135–145)

## 2017-10-03 LAB — BRAIN NATRIURETIC PEPTIDE: B Natriuretic Peptide: 1644 pg/mL — ABNORMAL HIGH (ref 0.0–100.0)

## 2017-10-03 LAB — TROPONIN I
Troponin I: 0.07 ng/mL (ref <0.03)
Troponin I: 0.06 ng/mL (ref <0.03)

## 2017-10-03 MED ORDER — BUMETANIDE 0.25 MG/ML IJ SOLN
1.0000 mg | Freq: Once | INTRAMUSCULAR | Status: AC
  Administered 2017-10-03: 1 mg via INTRAVENOUS
  Filled 2017-10-03: qty 4

## 2017-10-03 MED ORDER — OXYCODONE-ACETAMINOPHEN 5-325 MG PO TABS
1.0000 | ORAL_TABLET | Freq: Once | ORAL | Status: AC
  Administered 2017-10-03: 1 via ORAL
  Filled 2017-10-03: qty 1

## 2017-10-03 MED ORDER — PREGABALIN 50 MG PO CAPS
50.0000 mg | ORAL_CAPSULE | Freq: Once | ORAL | Status: AC
  Administered 2017-10-03: 50 mg via ORAL
  Filled 2017-10-03: qty 1

## 2017-10-03 NOTE — ED Provider Notes (Signed)
[facility] Emergency Department Provider Note   ____________________________________________   First MD Initiated Contact with Patient 10/03/17 0118     (approximate)  I have reviewed the triage vital signs and the nursing notes.   HISTORY  Chief Complaint Shortness of Breath    HPI Kelly Leonard is a 62 y.o. male who presents to the ED from home with a chief complaint of shortness of breath and swelling in bilateral lower extremities and abdomen.  Patient has a history of CHF, CKD, COPD, on Bumex who has had increasing shortness of breath over the past 2 days.  Denies associated fever, chills, chest pain, abdominal pain, nausea, vomiting.  States he has gained 3 pounds over the past 2 days and is unable to lay flat to sleep.  Denies recent travel or trauma.   Past Medical History:  Diagnosis Date  . Anxiety   . CHF (congestive heart failure) (HCC)   . Chronic systolic congestive heart failure (HCC)   . CKD (chronic kidney disease) stage 4, GFR 15-29 ml/min (HCC)   . COPD (chronic obstructive pulmonary disease) (HCC)   . Dilated cardiomyopathy (HCC)   . Hyperlipidemia   . Hypertension   . MI (myocardial infarction) (HCC)   . Polycystic kidney   . Renal disorder     Patient Active Problem List   Diagnosis Date Noted  . Gout 09/19/2017  . Rash 09/16/2017  . Acute on chronic systolic heart failure (HCC) 07/07/2017  . Acute renal failure with acute tubular necrosis superimposed on stage 4 chronic kidney disease (HCC)   . Hyponatremia 07/05/2017  . Cramps, muscle, general 07/05/2017  . Weakness generalized 07/05/2017  . Acute exacerbation of CHF (congestive heart failure) (HCC) 06/24/2017  . Acute on chronic systolic (congestive) heart failure (HCC) 05/12/2017  . Insomnia 05/05/2017  . Advanced care planning/counseling discussion 03/24/2017  . 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 Hypertension   . Hyperlipidemia   . Chronic  systolic congestive heart failure (HCC)   . Dilated cardiomyopathy (HCC)   . Polycystic kidney   . CKD (chronic kidney disease) stage 4, GFR 15-29 ml/min (HCC)   . 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 Anxiety disorder, unspecified 03/03/2013    Past Surgical History:  Procedure Laterality Date  . CARDIAC CATHETERIZATION    . IMPLANTABLE CARDIOVERTER DEFIBRILLATOR (ICD) GENERATOR CHANGE Right 06/23/2015   Procedure: ICD GENERATOR  INITIAL IMPLANT;  Surgeon: Sharion Settler, MD;  Location: [facility] ORS;  Service: Cardiovascular;  Laterality: Right;    Prior to Admission medications   Medication Sig Start Date End Date Taking? Authorizing Provider  bumetanide (BUMEX) 1 MG tablet Take 3 tablets (3 mg total) daily by mouth. May take additional 3 mg in the PM as needed for swelling/SOB 07/22/17  Yes Tillery, Mariam Dollar, PA-C  busPIRone (BUSPAR) 5 MG tablet Take 1 tablet (5 mg total) 3 (three) times daily by mouth. 07/15/17  Yes Graciella Freer, PA-C  Glecaprevir-Pibrentasvir (MAVYRET) 100-40 MG TABS Take 3 tablets by mouth daily with breakfast. 08/27/17  Yes Kuppelweiser, Cassie L, RPH-CPP  isosorbide-hydrALAZINE (BIDIL) 20-37.5 MG tablet Take 2 tablets 3 (three) times daily by mouth.   Yes [provider]  Melatonin 10 MG CAPS Take 10 mg by mouth at bedtime.   Yes [provider]  metolazone (ZAROXOLYN) 2.5 MG tablet Take 1 tablet (2.5 mg total) by mouth as directed. 09/16/17 12/15/17 Yes TilleryMariam Dollar, PA-C  oxyCODONE-acetaminophen (ROXICET) 5-325 MG  tablet Take 1 tablet by mouth every 4 (four) hours as needed for severe pain. Patient not taking: Reported on 10/03/2017 09/09/17   Darci Current, MD  potassium chloride SA (K-DUR,KLOR-CON) 20 MEQ tablet Take 2 tablets (40 mEq total) by mouth once for 1 dose. 09/19/17 09/19/17  Bensimhon, Bevelyn Buckles, MD  predniSONE (STERAPRED UNI-PAK 21 TAB) 10 MG (21) TBPK tablet Take as directed Patient  not taking: Reported on 10/03/2017 09/16/17   Graciella Freer, PA-C    Allergies Entresto [sacubitril-valsartan]; Penicillins; Seroquel [quetiapine]; Ace inhibitors; Corlanor [ivabradine]; Ambien [zolpidem tartrate]; Carvedilol; Hydroxyzine; Sertraline; Trazodone and nefazodone; Benadryl [diphenhydramine hcl]; Gabapentin; Lorazepam; and Torsemide  Family History  Problem Relation Age of Onset  . Diabetes Mother   . Hypertension Brother   . Heart disease Maternal Grandmother   . Heart attack Maternal Grandmother     Social History Social History   Tobacco Use  . Smoking status: Never Smoker  . Smokeless tobacco: Never Used  Substance Use Topics  . Alcohol use: No  . Drug use: No    Review of Systems  Constitutional: No fever/chills. Eyes: No visual changes. ENT: No sore throat. Cardiovascular: Denies chest pain. Respiratory: Positive for shortness of breath. Gastrointestinal: No abdominal pain.  No nausea, no vomiting.  No diarrhea.  No constipation. Genitourinary: Negative for dysuria. Musculoskeletal: Negative for back pain. Skin: Negative for rash. Neurological: Negative for headaches, focal weakness or numbness.   ____________________________________________   PHYSICAL EXAM:  VITAL SIGNS: ED Triage Vitals [10/03/17 0106]  Enc Vitals Group     BP      Pulse      Resp      Temp      Temp src      SpO2      Weight 139 lb (63 kg)     Height 5\' 9"  (1.753 m)     Head Circumference      Peak Flow      Pain Score      Pain Loc      Pain Edu?      Excl. in GC?     Constitutional: Alert and oriented. Well appearing and in mild acute distress. Eyes: Conjunctivae are normal. PERRL. EOMI. Head: Atraumatic. Nose: No congestion/rhinnorhea. Mouth/Throat: Mucous membranes are moist.  Oropharynx non-erythematous. Neck: No stridor.   Cardiovascular: Normal rate, regular rhythm. Grossly normal heart sounds.  Good peripheral circulation. Respiratory: Normal  respiratory effort.  No retractions. Lungs with bibasilar rales. Gastrointestinal: Soft and nontender. No distention. No abdominal bruits. No CVA tenderness. Musculoskeletal: No lower extremity tenderness nor edema.  No joint effusions. Neurologic:  Normal speech and language. No gross focal neurologic deficits are appreciated.  Skin:  Skin is warm, dry and intact. No rash noted. Psychiatric: Mood and affect are anxious. Speech and behavior are normal.  ____________________________________________   LABS (all labs ordered are listed, but only abnormal results are displayed)  Labs Reviewed  TROPONIN I - Abnormal; Notable for the following components:      Result Value   Troponin I 0.07 (*)    All other components within normal limits  BASIC METABOLIC PANEL - Abnormal; Notable for the following components:   Potassium 3.3 (*)    Chloride 95 (*)    BUN 84 (*)    Creatinine, Ser 3.35 (*)    GFR calc non Af Amer 19 (*)    GFR calc Af Amer 21 (*)    All other components within normal limits  CBC WITH DIFFERENTIAL/PLATELET - Abnormal; Notable for the following components:   RBC 4.33 (*)    Hemoglobin 10.1 (*)    HCT 32.2 (*)    MCV 74.3 (*)    MCH 23.4 (*)    MCHC 31.5 (*)    RDW 19.2 (*)    All other components within normal limits  BRAIN NATRIURETIC PEPTIDE - Abnormal; Notable for the following components:   B Natriuretic Peptide 1,644.0 (*)    All other components within normal limits  TROPONIN I - Abnormal; Notable for the following components:   Troponin I 0.06 (*)    All other components within normal limits   ____________________________________________  EKG  ED ECG REPORT I, SUNG,JADE J, the attending physician, personally viewed and interpreted this ECG.   Date: 10/03/2017  EKG Time: 0116  Rate: 86  Rhythm: normal EKG, normal sinus rhythm  Axis: Normal  Intervals:none  ST&T Change: Nonspecific  ____________________________________________  RADIOLOGY  Dg  Chest Portable 1 View  Result Date: 10/03/2017 CLINICAL DATA:  Shortness of breath since yesterday. Lower extremity and abdominal swelling. History of CHF. EXAM: PORTABLE CHEST 1 VIEW COMPARISON:  09/09/2017 FINDINGS: Cardiac pacemaker. Cardiac enlargement without vascular congestion. No edema or consolidation. No blunting of costophrenic angles. No pneumothorax. Mediastinal contours appear intact. IMPRESSION: Cardiac enlargement.  No evidence of active pulmonary disease. Electronically Signed   By: Burman Nieves M.D.   On: 10/03/2017 01:25    ____________________________________________   PROCEDURES  Procedure(s) performed: None  Procedures  Critical Care performed: No  ____________________________________________   INITIAL IMPRESSION / ASSESSMENT AND PLAN / ED COURSE  As part of my medical decision making, I reviewed the following data within the electronic MEDICAL RECORD NUMBER History obtained from family, Nursing notes reviewed and incorporated, Labs reviewed, EKG interpreted, Old chart reviewed, Radiograph reviewed and Notes from prior ED visits.   62 year old male with COPD, CHF on Bumex who presents with shortness of breath. Differential includes, but is not limited to, viral syndrome, bronchitis including COPD exacerbation, pneumonia, reactive airway disease including asthma, CHF including exacerbation with or without pulmonary/interstitial edema, pneumothorax, ACS, thoracic trauma, and pulmonary embolism.  Patient also complaining of restless legs.  Will obtain screening lab work, chest x-ray.  Administer IV Bumex, Lyrica and reassess.  Clinical Course as of Oct 03 544  Fri Oct 03, 2017  6122 Patient ambulated briskly around the department, maintained room air saturations 100%.  He has put out some urine, feels much better and eager for discharge.  I discussed with him elevated troponin which is relatively stable from baseline but requires repeat troponin.  Also baseline renal  insufficiency BMP.  Patient understands this and is agreeable to plan.  [JS]  [phone] Patient very eager for discharge home because he has a doctor's appointment later this morning and would like to go home and get some rest.  We discussed giving him an anxiolytic because he is jittery, bouncing around the room and so eager to go home.  Unfortunately, anxiolytics give him rash and swelling.  He has, however taken Percocet previously without adverse reaction.  Will give Percocet now which should help also with his restless legs.  We will go ahead and draw a repeat troponin per patient's request.  [JS]  0404 Repeat troponin trending down.  Updated patient and spouse.  Strict return precautions given.  Both verbalize understanding and agree with plan of care.  [JS]    Clinical Course User Index [JS] Irean Hong, MD  ____________________________________________   FINAL CLINICAL IMPRESSION(S) / ED DIAGNOSES  Final diagnoses:  SOB (shortness of breath)  Acute on chronic congestive heart failure, unspecified heart failure type (HCC)  Elevated troponin     ED Discharge Orders    None       Note:  This document was prepared using Dragon voice recognition software and may include unintentional dictation errors.    Irean Hong, MD 10/03/17 [phone]

## 2017-10-03 NOTE — ED Notes (Signed)

## 2017-10-03 NOTE — ED Notes (Signed)
Red vest being performed by Raquel RN at this time.

## 2017-10-03 NOTE — ED Notes (Signed)
Attempted REDS vest on patient but low quality due to restlessness, MD aware.

## 2017-10-03 NOTE — ED Triage Notes (Signed)
Patient ambulatory to triage with steady gait, without difficulty or distress noted; pt reports SHOB and swelling in LE(s) and abdomen; pt with hx CHF

## 2017-10-03 NOTE — ED Notes (Signed)
Per EDP, redraw troponin at this time

## 2017-10-03 NOTE — ED Notes (Addendum)
Pt ambulated without difficulty around nurses station. Pt's oxygen saturation before and during ambulation was 100%. Pt denies SHOB. EDP aware.

## 2017-10-03 NOTE — Discharge Instructions (Signed)
Take your medicines daily as directed by your doctor.  Return to the ER for worsening symptoms, persistent vomiting, difficulty breathing or other concerns. 

## 2017-10-06 ENCOUNTER — Encounter (HOSPITAL_COMMUNITY): Payer: Self-pay | Admitting: *Deleted

## 2017-10-06 ENCOUNTER — Telehealth: Payer: Self-pay | Admitting: Pharmacist

## 2017-10-06 ENCOUNTER — Ambulatory Visit (HOSPITAL_COMMUNITY)
Admission: RE | Admit: 2017-10-06 | Discharge: 2017-10-06 | Disposition: A | Discharge status: Home / Self Care | Payer: BLUE CROSS/BLUE SHIELD | Source: Ambulatory Visit | Attending: Internal Medicine | Admitting: Internal Medicine

## 2017-10-06 ENCOUNTER — Other Ambulatory Visit: Payer: Self-pay

## 2017-10-06 VITALS — BP 122/72 | HR 85 | Wt 142.5 lb

## 2017-10-06 DIAGNOSIS — Z95810 Presence of automatic (implantable) cardiac defibrillator: Secondary | ICD-10-CM | POA: Diagnosis not present

## 2017-10-06 DIAGNOSIS — E785 Hyperlipidemia, unspecified: Secondary | ICD-10-CM | POA: Diagnosis not present

## 2017-10-06 DIAGNOSIS — I5022 Chronic systolic (congestive) heart failure: Secondary | ICD-10-CM | POA: Insufficient documentation

## 2017-10-06 DIAGNOSIS — J449 Chronic obstructive pulmonary disease, unspecified: Secondary | ICD-10-CM | POA: Diagnosis not present

## 2017-10-06 DIAGNOSIS — Z8249 Family history of ischemic heart disease and other diseases of the circulatory system: Secondary | ICD-10-CM | POA: Insufficient documentation

## 2017-10-06 DIAGNOSIS — M109 Gout, unspecified: Secondary | ICD-10-CM | POA: Insufficient documentation

## 2017-10-06 DIAGNOSIS — I252 Old myocardial infarction: Secondary | ICD-10-CM | POA: Diagnosis not present

## 2017-10-06 DIAGNOSIS — Z888 Allergy status to other drugs, medicaments and biological substances status: Secondary | ICD-10-CM | POA: Insufficient documentation

## 2017-10-06 DIAGNOSIS — I420 Dilated cardiomyopathy: Secondary | ICD-10-CM | POA: Diagnosis not present

## 2017-10-06 DIAGNOSIS — N184 Chronic kidney disease, stage 4 (severe): Secondary | ICD-10-CM | POA: Insufficient documentation

## 2017-10-06 DIAGNOSIS — I130 Hypertensive heart and chronic kidney disease with heart failure and stage 1 through stage 4 chronic kidney disease, or unspecified chronic kidney disease: Secondary | ICD-10-CM | POA: Diagnosis present

## 2017-10-06 DIAGNOSIS — M10071 Idiopathic gout, right ankle and foot: Secondary | ICD-10-CM

## 2017-10-06 DIAGNOSIS — R21 Rash and other nonspecific skin eruption: Secondary | ICD-10-CM | POA: Diagnosis not present

## 2017-10-06 DIAGNOSIS — Z880 Allergy status to penicillin: Secondary | ICD-10-CM | POA: Diagnosis not present

## 2017-10-06 DIAGNOSIS — Q613 Polycystic kidney, unspecified: Secondary | ICD-10-CM | POA: Diagnosis not present

## 2017-10-06 DIAGNOSIS — Z833 Family history of diabetes mellitus: Secondary | ICD-10-CM | POA: Diagnosis not present

## 2017-10-06 DIAGNOSIS — F419 Anxiety disorder, unspecified: Secondary | ICD-10-CM | POA: Diagnosis not present

## 2017-10-06 DIAGNOSIS — Z79899 Other long term (current) drug therapy: Secondary | ICD-10-CM | POA: Diagnosis not present

## 2017-10-06 DIAGNOSIS — N179 Acute kidney failure, unspecified: Secondary | ICD-10-CM | POA: Diagnosis not present

## 2017-10-06 LAB — BASIC METABOLIC PANEL
ANION GAP: 12 (ref 5–15)
BUN: 92 mg/dL — ABNORMAL HIGH (ref 6–20)
CALCIUM: 9 mg/dL (ref 8.9–10.3)
CO2: 26 mmol/L (ref 22–32)
Chloride: 97 mmol/L — ABNORMAL LOW (ref 101–111)
Creatinine, Ser: 3.23 mg/dL — ABNORMAL HIGH (ref 0.61–1.24)
GFR, EST AFRICAN AMERICAN: 22 mL/min — AB (ref >60)
GFR, EST NON AFRICAN AMERICAN: 19 mL/min — AB (ref >60)
Glucose, Bld: 117 mg/dL — ABNORMAL HIGH (ref 65–99)
POTASSIUM: 4.1 mmol/L (ref 3.5–5.1)
Sodium: 135 mmol/L (ref 135–145)

## 2017-10-06 LAB — HEPATITIS C RNA QUANTITATIVE
HCV QUANT LOG: NOT DETECTED {Log_IU}/mL
HCV RNA, PCR, QN: NOT DETECTED [IU]/mL

## 2017-10-06 MED ORDER — ALLOPURINOL 100 MG PO TABS: 100.0000 mg | ORAL_TABLET | Freq: Every day | ORAL | 3 refills | Status: DC

## 2017-10-06 MED ORDER — POTASSIUM CHLORIDE ER 20 MEQ PO TBCR: 20.0000 meq | EXTENDED_RELEASE_TABLET | ORAL | 3 refills | Status: DC | PRN

## 2017-10-06 NOTE — Patient Instructions (Addendum)
Labs today (will call for abnormal results, otherwise no news is good news)  We will refer you to Rheumatology to evaluate your gout.   If weight is greater than 139 lbs, take metolazone 2.5 mg along with 20 mEq of Potassium.   Follow up in 6 weeks

## 2017-10-06 NOTE — Telephone Encounter (Signed)
Called Kelly Leonard and told him his early on treatment Hep C viral load was already undetectable on Mavyret.  He was excited.  Told him to make sure he didn't miss any doses and to finish out the 3 months.

## 2017-10-06 NOTE — Progress Notes (Signed)
Advanced Heart Failure Clinic Note  Primary Physician: Gabriel Cirri NP  Primary Cardiologist:  Horald Chestnut Nephrology: Dr Glenna Fellows  Primary HF: Dr. Gala Romney   HPI: Kelly Leonard is a 62 y.o. 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    Patient admitted to [facility] in 10/18 with low output HF and AKI. Transferred to [facility] for further management.  On arrival to [facility], swan placed (07/07/17) which showed low output physiology. RA = 12 PA =   48/22 (32) PCW = 14 Thermo cardiac output/index = 4.3/2.4 Fick CO/CI = 2.7/1.6 Ao sat = 96% PA sat = 41%  Started on dobutamine. HF meds optimized. Dobutamine stopped since 07/13/17. Creatinine peaked and 4.14 and improved to 2.5 with dobutamine  Hospital course complicated by severe anxiety and seen by Psych who adjusted meds with excellent improvement.  Hepatitis C diagnosis as well as UTI. Plan for ID follow up as outpatient. Pt completed course of levaquin for UTI. D/c weight 139.  Pt presents today for close follow up. Seen 2 weeks ago with worsening volume overloaded. Bumex doubled and metolazone added for several days. Overall much better but had to go to the ER on 1/25 for SOB. Currently taking 3mg  bumex daily. Will take metolazone as needed. Weight 135-139. Walking 1-2 miles a day. No orthopnea or PND. Gout resolved. Edema improved. SBP 115-120.     Review of systems complete and found to be negative unless listed in HPI.    Past Medical History:  Diagnosis Date  . Anxiety   . CHF (congestive heart failure) (HCC)   . Chronic systolic congestive heart failure (HCC)   . CKD (chronic kidney disease) stage 4, GFR 15-29 ml/min (HCC)   . COPD (chronic obstructive pulmonary disease) (HCC)   . Dilated cardiomyopathy (HCC)   . Hyperlipidemia   . Hypertension   . MI (myocardial infarction) (HCC)   . Polycystic kidney   . Renal disorder      Current Outpatient Medications  Medication Sig Dispense Refill  . bumetanide (BUMEX) 1 MG tablet Take 3 tablets (3 mg total) daily by mouth. May take additional 3 mg in the PM as needed for swelling/SOB 90 tablet 3  . busPIRone (BUSPAR) 5 MG tablet Take 1 tablet (5 mg total) 3 (three) times daily by mouth. 90 tablet 3  . Glecaprevir-Pibrentasvir (MAVYRET) 100-40 MG TABS Take 3 tablets by mouth daily with breakfast. 84 tablet 2  . isosorbide-hydrALAZINE (BIDIL) 20-37.5 MG tablet Take 2 tablets 3 (three) times daily by mouth.    . Melatonin 10 MG CAPS Take 10 mg by mouth at bedtime.    . metolazone (ZAROXOLYN) 2.5 MG tablet Take 1 tablet (2.5 mg total) by mouth as directed. 5 tablet 0   No current facility-administered medications for this encounter.    Allergies  Allergen Reactions  . Entresto [Sacubitril-Valsartan] Swelling    Swelling of the face  . Penicillins Anaphylaxis and Swelling    Has patient had a PCN reaction causing immediate rash, facial/tongue/throat swelling, SOB or lightheadedness with hypotension: Yes Has patient had a PCN reaction causing severe rash involving mucus membranes or skin necrosis: No Has patient had a PCN reaction that required hospitalization: No Has patient had a PCN reaction occurring within the last 10 years: No If all of the above answers are "NO", then may proceed with Cephalosporin use.   . Seroquel [Quetiapine] Nausea And Vomiting and  Hives    Hives   . Ace Inhibitors Rash  . Corlanor [Ivabradine] Swelling    Facial swelling  . Ambien [Zolpidem Tartrate] Swelling  . Carvedilol Other (See Comments)    "dizziness, light headed, and nausea"  . Hydroxyzine Nausea Only  . Sertraline Diarrhea    itching  . Trazodone And Nefazodone Swelling and Other (See Comments)    "up for days" and lip swelling  . Benadryl [Diphenhydramine Hcl] Other (See Comments)    "makes me crazy and hyper"  . Gabapentin Swelling    Lip and face swelling  .  Lorazepam Rash    Rash and severe anxiety  . Torsemide Hives, Itching, Nausea And Vomiting and Rash    Social History   Socioeconomic History  . Marital status: Single    Spouse name: Not on file  . Number of children: Not on file  . Years of education: Not on file  . Highest education level: Not on file  Social Needs  . Financial resource strain: Not on file  . Food insecurity - worry: Not on file  . Food insecurity - inability: Not on file  . Transportation needs - medical: Not on file  . Transportation needs - non-medical: Not on file  Occupational History  . Not on file  Tobacco Use  . Smoking status: Never Smoker  . Smokeless tobacco: Never Used  Substance and Sexual Activity  . Alcohol use: No  . Drug use: No  . Sexual activity: Yes  Other Topics Concern  . Not on file  Social History Narrative  . Not on file    Family History  Problem Relation Age of Onset  . Diabetes Mother   . Hypertension Brother   . Heart disease Maternal Grandmother   . Heart attack Maternal Grandmother    Vitals:   10/06/17 0914  BP: 122/72  Pulse: 85  SpO2: 100%  Weight: 142 lb 8 oz (64.6 kg)    Wt Readings from Last 3 Encounters:  10/06/17 142 lb 8 oz (64.6 kg)  10/03/17 139 lb (63 kg)  09/19/17 142 lb 6 oz (64.6 kg)    PHYSICAL EXAM: General:  Well appearing. No resp difficulty HEENT: normal Neck: supple. no JVD. Carotids 2+ bilat; no bruits. No lymphadenopathy or thryomegaly appreciated. Cor: PMI laterally displaced. Regular rate & rhythm. No rubs, gallops or murmurs. Lungs: clear Abdomen: soft, nontender, nondistended. No hepatosplenomegaly. No bruits or masses. Good bowel sounds. Extremities: no cyanosis, clubbing, rash,  Trace edema Neuro: alert & orientedx3, cranial nerves grossly intact. moves all 4 extremities w/o difficulty. Affect pleasant  ASSESSMENT & PLAN: 1. Chronic systolic Heart Failure - NICM. ECHO EF 05/2017 EF 20-25%. S/p ICD - Volume status much  improved.  NYHA II-III - Target weight seems to be 133-137. - Continue bumex 3 mg daily. Take metoalzone 2.5 and kcl 20 for weight 139 or greter - Watch renal function closely - No Spiro/ACE/ARB/ARNI with CKD IV.  - No BB yet with low output.  - Allergic to entresto, carvedilol, torsemide, ace.  - Back on Bidil 2 tabs TID without difficulty.  - Reinforced fluid restriction to < 2 L daily, sodium restriction to less than 2000 mg daily, and the importance of daily weights. - Long talk today about possible heart-kidney transplant eval at [facility]. He wants to think about it some more 2. AKI on CKD stage IV in setting of polycystic KD - Creatinine baseline 3.0-3.5 in setting of PCKD and HF  -  Recheck BMET otday  3. Anxiety  - Severe. Continue home meds. No change.  4. HCV - On Mavyret per ID. No change.  5. Gout  - Unable to take allopurinol due to rash.  - Will refer to Rheumatology to consider Uloric 6. Rash on back - Resolved with steroid dose pack.   Total time spent 45 minutes. Over half that time spent discussing above.    Arvilla Meres, MD 10/06/17

## 2017-10-21 IMAGING — CR DG CHEST 2V
2 series · 2 of 2 positions shown · non-contrast
Comparison: 06/05/2017

CLINICAL DATA: Mid chest pain tonight.

EXAM:
CHEST  2 VIEW

[chest pa]
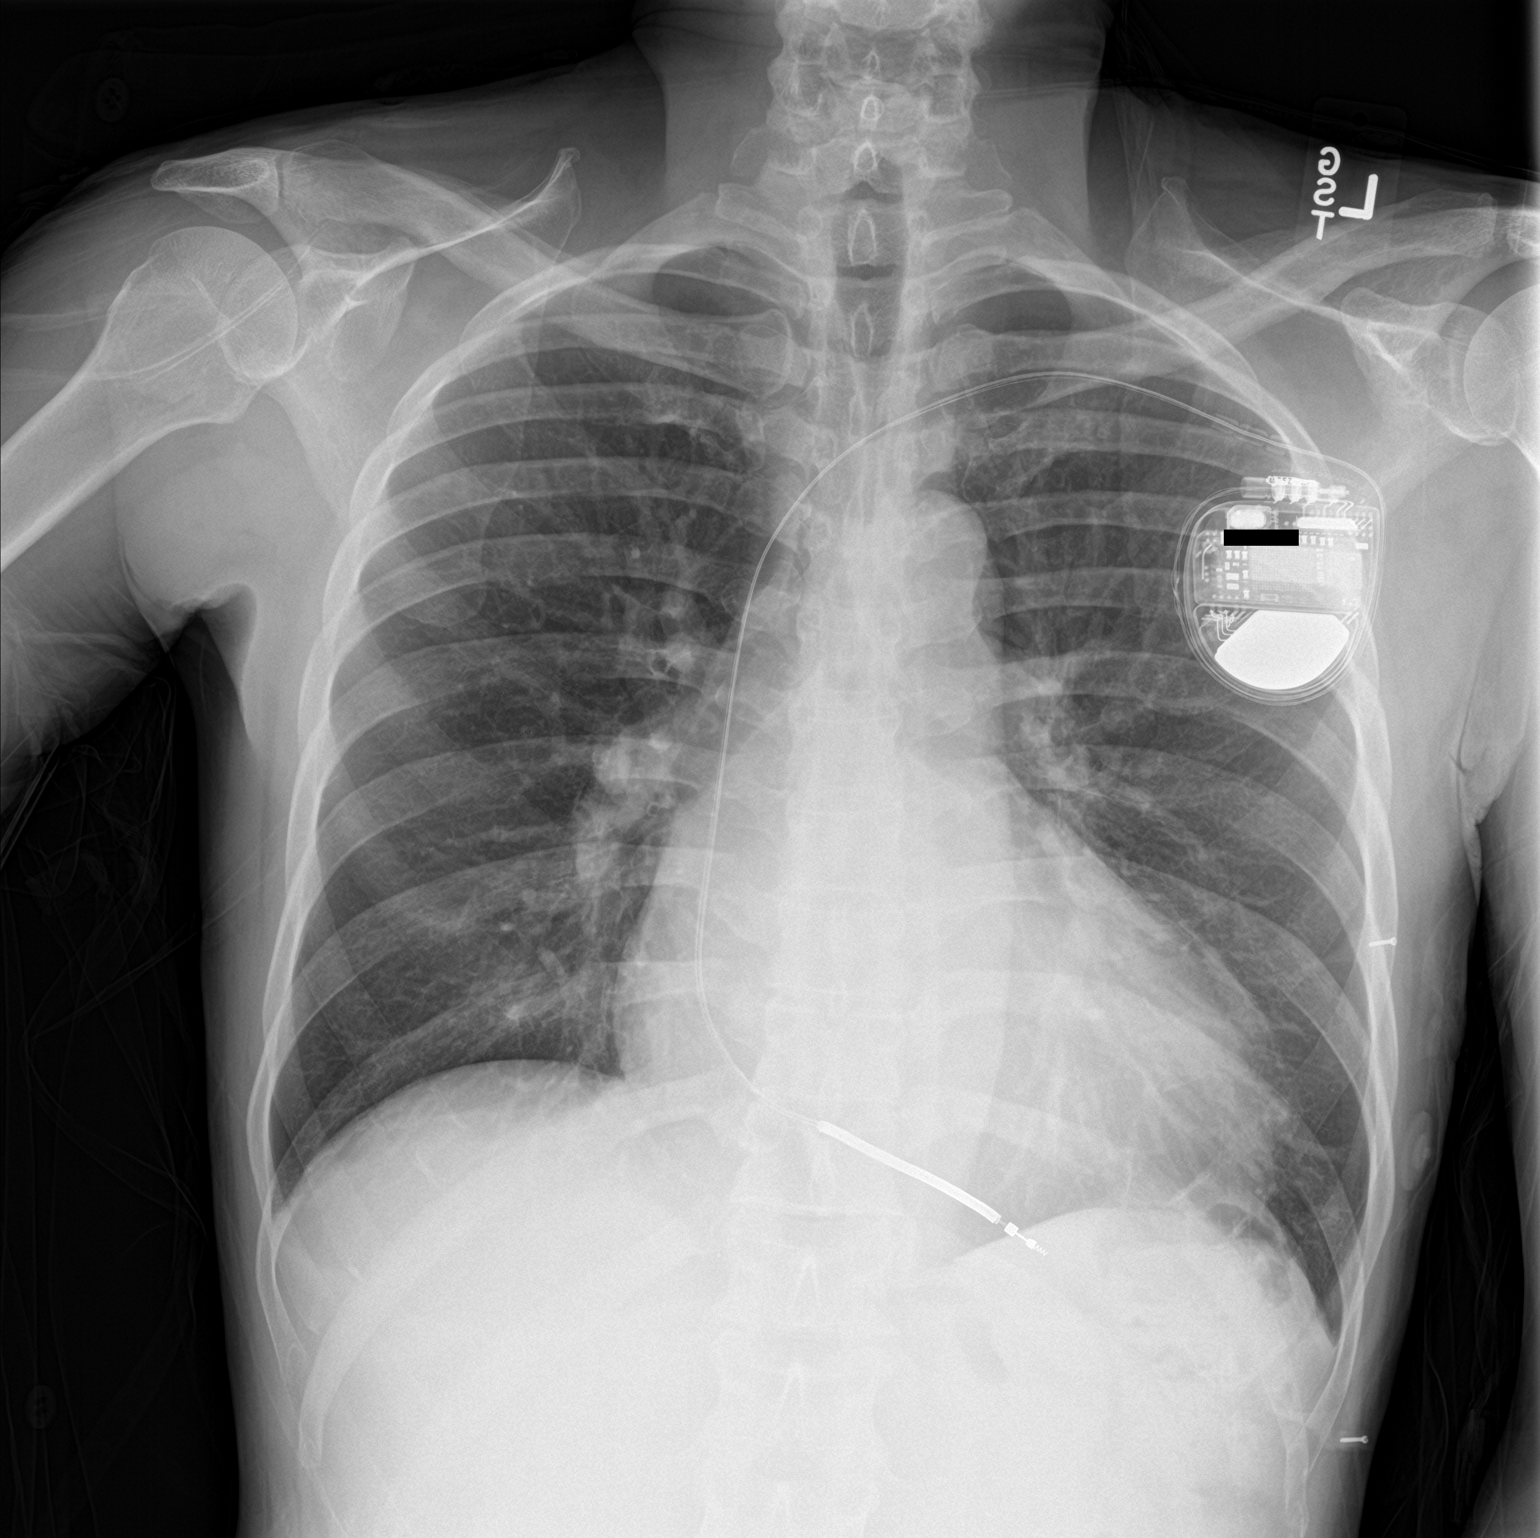

[chest lat]
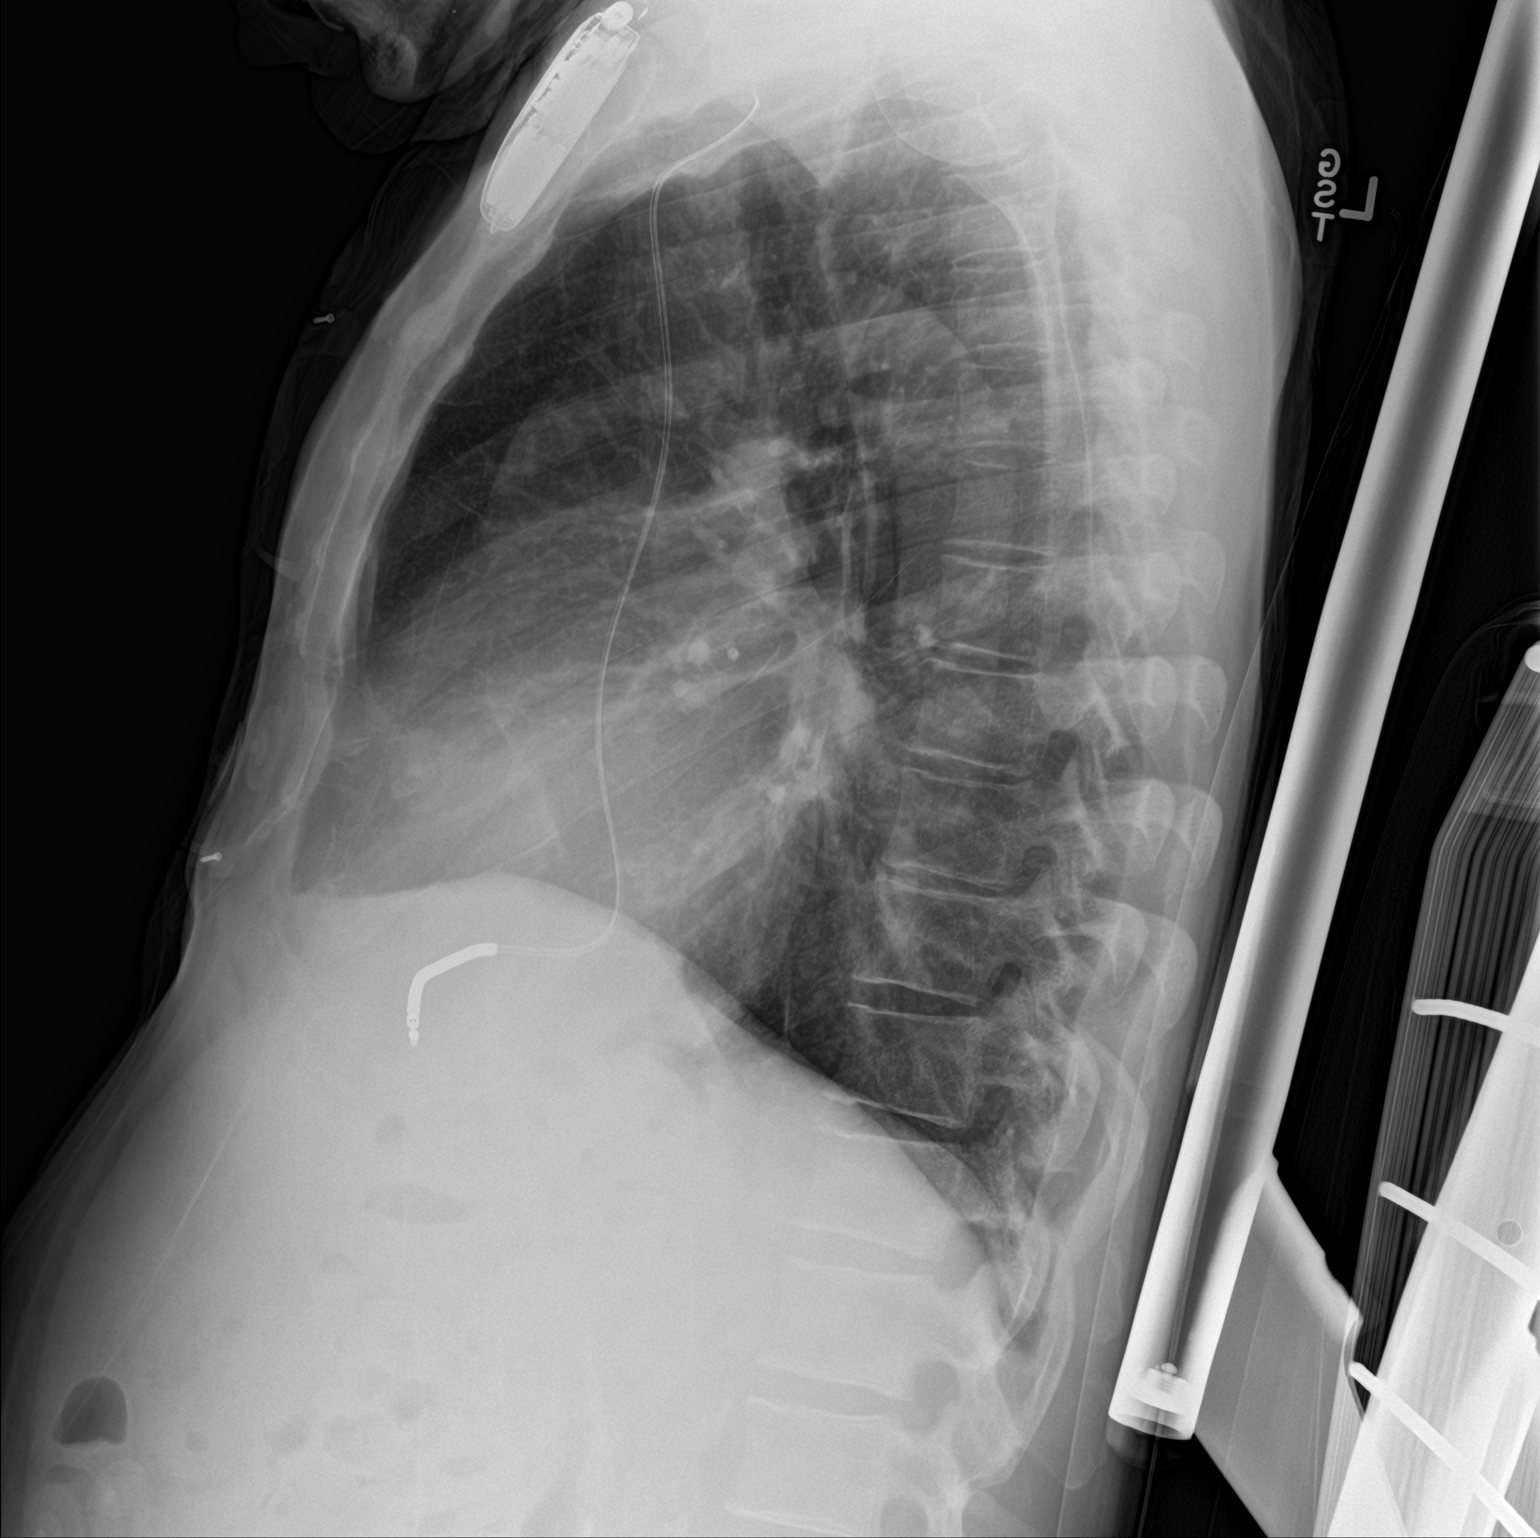

[2 of 2 positions shown; findings below may reference images not displayed]

FINDINGS: Cardiac pacemaker. Cardiac enlargement without vascular congestion.
No edema or consolidation. No blunting of costophrenic angles. No
pneumothorax. Mediastinal contours appear intact.
IMPRESSION: Cardiac enlargement.  No evidence of active pulmonary disease.

## 2017-10-30 ENCOUNTER — Other Ambulatory Visit: Payer: Self-pay | Admitting: Pharmacist

## 2017-10-30 DIAGNOSIS — B182 Chronic viral hepatitis C: Secondary | ICD-10-CM

## 2017-11-06 ENCOUNTER — Ambulatory Visit (INDEPENDENT_AMBULATORY_CARE_PROVIDER_SITE_OTHER): Payer: BLUE CROSS/BLUE SHIELD | Admitting: Pharmacist

## 2017-11-06 ENCOUNTER — Other Ambulatory Visit: Payer: Self-pay | Admitting: Pharmacist

## 2017-11-06 DIAGNOSIS — B182 Chronic viral hepatitis C: Secondary | ICD-10-CM

## 2017-11-06 NOTE — Progress Notes (Signed)
Patient came into clinic to pick up his 3rd month of Mavyret.  We had a hard time getting insurance to cover it, but we were able to provide him with his last month without him missing any doses.

## 2017-11-19 ENCOUNTER — Encounter (HOSPITAL_COMMUNITY): Payer: Self-pay | Admitting: Internal Medicine

## 2017-11-19 ENCOUNTER — Ambulatory Visit (HOSPITAL_COMMUNITY)
Admission: RE | Admit: 2017-11-19 | Discharge: 2017-11-19 | Disposition: A | Discharge status: Home / Self Care | Payer: BLUE CROSS/BLUE SHIELD | Source: Ambulatory Visit | Attending: Internal Medicine | Admitting: Internal Medicine

## 2017-11-19 VITALS — BP 138/90 | HR 94 | Wt 142.0 lb

## 2017-11-19 DIAGNOSIS — I10 Essential (primary) hypertension: Secondary | ICD-10-CM

## 2017-11-19 DIAGNOSIS — I517 Cardiomegaly: Secondary | ICD-10-CM | POA: Insufficient documentation

## 2017-11-19 DIAGNOSIS — I5022 Chronic systolic (congestive) heart failure: Secondary | ICD-10-CM | POA: Diagnosis not present

## 2017-11-19 DIAGNOSIS — N184 Chronic kidney disease, stage 4 (severe): Secondary | ICD-10-CM | POA: Diagnosis not present

## 2017-11-19 DIAGNOSIS — R9431 Abnormal electrocardiogram [ECG] [EKG]: Secondary | ICD-10-CM | POA: Insufficient documentation

## 2017-11-19 DIAGNOSIS — I4581 Long QT syndrome: Secondary | ICD-10-CM | POA: Insufficient documentation

## 2017-11-19 LAB — BASIC METABOLIC PANEL
ANION GAP: 15 (ref 5–15)
BUN: 83 mg/dL — ABNORMAL HIGH (ref 6–20)
CO2: 30 mmol/L (ref 22–32)
CREATININE: 4.12 mg/dL — AB (ref 0.61–1.24)
Calcium: 9.2 mg/dL (ref 8.9–10.3)
Chloride: 94 mmol/L — ABNORMAL LOW (ref 101–111)
GFR calc non Af Amer: 14 mL/min — ABNORMAL LOW (ref >60)
GFR, EST AFRICAN AMERICAN: 17 mL/min — AB (ref >60)
Glucose, Bld: 95 mg/dL (ref 65–99)
POTASSIUM: 4.7 mmol/L (ref 3.5–5.1)
Sodium: 139 mmol/L (ref 135–145)

## 2017-11-19 MED ORDER — METOLAZONE 2.5 MG PO TABS: 2.5000 mg | ORAL_TABLET | ORAL | 0 refills | Status: DC

## 2017-11-19 NOTE — Progress Notes (Signed)
Advanced Heart Failure Clinic Note  Primary Physician: Gabriel Cirri NP  Primary Cardiologist:  Horald Chestnut Nephrology: Dr Glenna Fellows  Primary HF: Dr. Gala Romney   HPI: Kelly Leonard is a 62 y.o. 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    Patient admitted to [facility] in 10/18 with low output HF and AKI. Transferred to [facility] for further management.  On arrival to [facility], swan placed (07/07/17) which showed low output physiology. RA = 12 PA =   48/22 (32) PCW = 14 Thermo cardiac output/index = 4.3/2.4 Fick CO/CI = 2.7/1.6 Ao sat = 96% PA sat = 41%  Started on dobutamine. HF meds optimized. Dobutamine stopped 07/13/17. Creatinine peaked and 4.14 and improved to 2.5 with dobutamine  Hospital course complicated by severe anxiety and seen by Psych who adjusted meds with excellent improvement.  Hepatitis C diagnosis as well as UTI. Plan for ID follow up as outpatient. Pt completed course of levaquin for UTI. D/c weight 139.  He presents today for HF follow up. Overall feeling good. Vomitted and had diarrhea yesterday and went to PCP who said he may have a GI bug. He is walking 2 miles/day with no SOB. Appetite great. Has occasional BLE due to gout. +orthopnea, +PND. No CP or dizziness. Weights 134-142 lbs. Takes double bumex + metolazone when weight >137 lbs. He does this about 1x/month and feels fatigued afterward. BP 110-130s at home. Compliant with medications. Limits fluid and salt intake.  Review of systems complete and found to be negative unless listed in HPI.    Past Medical History:  Diagnosis Date  . Anxiety   . CHF (congestive heart failure) (HCC)   . Chronic systolic congestive heart failure (HCC)   . CKD (chronic kidney disease) stage 4, GFR 15-29 ml/min (HCC)   . COPD (chronic obstructive pulmonary disease) (HCC)   . Dilated cardiomyopathy (HCC)   . Hyperlipidemia   .  Hypertension   . MI (myocardial infarction) (HCC)   . Polycystic kidney   . Renal disorder     Current Outpatient Medications  Medication Sig Dispense Refill  . bumetanide (BUMEX) 1 MG tablet Take 3 tablets (3 mg total) daily by mouth. May take additional 3 mg in the PM as needed for swelling/SOB 90 tablet 3  . busPIRone (BUSPAR) 5 MG tablet Take 1 tablet (5 mg total) 3 (three) times daily by mouth. 90 tablet 3  . Glecaprevir-Pibrentasvir (MAVYRET) 100-40 MG TABS Take 3 tablets by mouth daily with breakfast. 84 tablet 2  . isosorbide-hydrALAZINE (BIDIL) 20-37.5 MG tablet Take 2 tablets 3 (three) times daily by mouth.    . Melatonin 10 MG CAPS Take 10 mg by mouth at bedtime.    . metolazone (ZAROXOLYN) 2.5 MG tablet Take 1 tablet (2.5 mg total) by mouth as directed. 5 tablet 0  . potassium chloride 20 MEQ TBCR Take 20 mEq by mouth as needed. Take 20 mEq only with metolazone. 30 tablet 3  . promethazine (PHENERGAN) 25 MG tablet Take 25 mg by mouth every 6 (six) hours as needed for nausea or vomiting.     No current facility-administered medications for this encounter.    Allergies  Allergen Reactions  . Entresto [Sacubitril-Valsartan] Swelling    Swelling of the face  . Penicillins Anaphylaxis and Swelling    Has patient had a PCN reaction causing immediate rash, facial/tongue/throat swelling, SOB or lightheadedness with hypotension: Yes Has  patient had a PCN reaction causing severe rash involving mucus membranes or skin necrosis: No Has patient had a PCN reaction that required hospitalization: No Has patient had a PCN reaction occurring within the last 10 years: No If all of the above answers are "NO", then may proceed with Cephalosporin use.   . Seroquel [Quetiapine] Nausea And Vomiting and Hives    Hives   . Ace Inhibitors Rash  . Corlanor [Ivabradine] Swelling    Facial swelling  . Ambien [Zolpidem Tartrate] Swelling  . Carvedilol Other (See Comments)    "dizziness, light  headed, and nausea"  . Hydroxyzine Nausea Only  . Sertraline Diarrhea    itching  . Trazodone And Nefazodone Swelling and Other (See Comments)    "up for days" and lip swelling  . Benadryl [Diphenhydramine Hcl] Other (See Comments)    "makes me crazy and hyper"  . Gabapentin Swelling    Lip and face swelling  . Lorazepam Rash    Rash and severe anxiety  . Torsemide Hives, Itching, Nausea And Vomiting and Rash    Social History   Socioeconomic History  . Marital status: Single    Spouse name: Not on file  . Number of children: Not on file  . Years of education: Not on file  . Highest education level: Not on file  Social Needs  . Financial resource strain: Not on file  . Food insecurity - worry: Not on file  . Food insecurity - inability: Not on file  . Transportation needs - medical: Not on file  . Transportation needs - non-medical: Not on file  Occupational History  . Not on file  Tobacco Use  . Smoking status: Never Smoker  . Smokeless tobacco: Never Used  Substance and Sexual Activity  . Alcohol use: No  . Drug use: No  . Sexual activity: Yes  Other Topics Concern  . Not on file  Social History Narrative  . Not on file    Family History  Problem Relation Age of Onset  . Diabetes Mother   . Hypertension Brother   . Heart disease Maternal Grandmother   . Heart attack Maternal Grandmother    Vitals:   11/19/17 1455  BP: 138/90  Pulse: 94  SpO2: 96%  Weight: 142 lb (64.4 kg)    Wt Readings from Last 3 Encounters:  11/19/17 142 lb (64.4 kg)  10/06/17 142 lb 8 oz (64.6 kg)  10/03/17 139 lb (63 kg)    PHYSICAL EXAM: General: Well appearing. No resp difficulty. HEENT: Normal anicteric Neck: Supple. JVP 5-6. Carotids 2+ bilat; no bruits. No thyromegaly or nodule noted. Cor: PMI  Laterally displaced. RRR, 2/6 systolic murmur at apex Lungs: CTAB, normal effort. Abdomen: Soft, non-tender, non-distended, no HSM. No bruits or masses. +BS  Extremities: No  cyanosis, clubbing, or rash. R and LLE no edema.  Neuro: Alert & orientedx3, cranial nerves grossly intact. moves all 4 extremities w/o difficulty. Affect pleasant  NSR: NSR with occasional PVC's, 82 bpm. Possible left atrial enlargement, left axis deviation  ASSESSMENT & PLAN: 1. Chronic systolic Heart Failure - NICM. ECHO EF 06/2017 EF 20-25%. S/p [employer] ICD - Volume status stable.  NYHA II - Target weight seems to be 135-139. - Continue bumex 3 mg daily. Take additional dose at night or metoalzone 2.5 and kcl 20 for weight 140 or greater - BMET today - No Spiro/ACE/ARB/ARNI with CKD IV.  - No BB yet with low output.  - Allergic to entresto, carvedilol,  torsemide, ace.  - Continue Bidil 2 tabs TID without difficulty.  - Reinforced fluid restriction to < 2 L daily, sodium restriction to less than 2000 mg daily, and the importance of daily weights. - Dr Gala Romney spoke with him at last visit about possible heart-kidney transplant eval at [facility].  2. AKI on CKD stage IV in setting of polycystic KD - Creatinine baseline 3.0-3.5 in setting of PCKD and HF  - BMET today 3. Anxiety  - Severe. Continue home meds. No change 4. HCV - On Mavyret per ID. No change.  5. Gout  - Unable to take allopurinol due to rash.  - Will refer to Rheumatology to consider Uloric   BMET  EKG F/u in 3-4 months Take extra 3 mg bumex OR 2.5 metolazone with 20 meq potassium for weight grain >/= 140 lbs.   Alford Highland, NP 11/19/17   Patient seen and examined with the above-signed Advanced Practice Provider and/or Housestaff. I personally reviewed laboratory data, imaging studies and relevant notes. I independently examined the patient and formulated the important aspects of the plan. I have edited the note to reflect any of my changes or salient points. I have personally discussed the plan with the patient and/or family.  Overall stable NYHA II. Volume status looks good. Seems to have gained some lean  body mass so will increase dry weight as above. HF regimen limited by drug intolerances and advanced CKD. Continue Bidil. Recheck BMET. We discussed possible heart-kidney transplant eval but I think social issues will limit.   Arvilla Meres, MD  10:28 PM

## 2017-11-19 NOTE — Patient Instructions (Signed)
If weight is 140 lb or greater you can either take an extra 3 mg of Bumex or take 2.5 mg of Metolazone   Labs today  Your physician recommends that you schedule a follow-up appointment in: 3-4 months

## 2017-11-21 ENCOUNTER — Other Ambulatory Visit: Payer: Self-pay | Admitting: Unknown Physician Specialty

## 2017-11-24 ENCOUNTER — Telehealth (HOSPITAL_COMMUNITY): Payer: Self-pay | Admitting: *Deleted

## 2017-11-24 NOTE — Telephone Encounter (Signed)
Result Notes for Basic metabolic panel   Notes recorded by Georgina Peer, RN on 11/24/2017 at 9:15 AM EDT Patient's wife called back and they would like labs drawn at his PCP in [place]. Bmet prescription sent to Dr. Berniece Andreas office @ [phone]. ------  Notes recorded by Noralee Space, RN on 11/21/2017 at 4:12 PM EDT Left message to call back ------  Notes recorded by Bensimhon, Bevelyn Buckles, MD on 11/20/2017 at 11:51 PM EDT Recheck 2 weeks in [place]

## 2017-11-24 NOTE — Telephone Encounter (Signed)
Please schedule patient a f/up visit with Elnita Maxwell. Thanks.

## 2017-11-24 NOTE — Telephone Encounter (Signed)
This needs f/u according to notes

## 2017-11-26 ENCOUNTER — Encounter: Payer: Self-pay | Admitting: Emergency Medicine

## 2017-11-26 ENCOUNTER — Emergency Department: Payer: BLUE CROSS/BLUE SHIELD

## 2017-11-26 ENCOUNTER — Other Ambulatory Visit: Payer: Self-pay

## 2017-11-26 ENCOUNTER — Emergency Department
Admission: EM | Admit: 2017-11-26 | Discharge: 2017-11-26 | Disposition: A | Discharge status: Home / Self Care | Payer: BLUE CROSS/BLUE SHIELD | Attending: Emergency Medicine | Admitting: Emergency Medicine

## 2017-11-26 DIAGNOSIS — I5022 Chronic systolic (congestive) heart failure: Secondary | ICD-10-CM | POA: Diagnosis not present

## 2017-11-26 DIAGNOSIS — Z79899 Other long term (current) drug therapy: Secondary | ICD-10-CM | POA: Insufficient documentation

## 2017-11-26 DIAGNOSIS — F419 Anxiety disorder, unspecified: Secondary | ICD-10-CM

## 2017-11-26 DIAGNOSIS — I130 Hypertensive heart and chronic kidney disease with heart failure and stage 1 through stage 4 chronic kidney disease, or unspecified chronic kidney disease: Secondary | ICD-10-CM | POA: Insufficient documentation

## 2017-11-26 DIAGNOSIS — F410 Panic disorder [episodic paroxysmal anxiety] without agoraphobia: Secondary | ICD-10-CM | POA: Diagnosis present

## 2017-11-26 DIAGNOSIS — N184 Chronic kidney disease, stage 4 (severe): Secondary | ICD-10-CM | POA: Insufficient documentation

## 2017-11-26 DIAGNOSIS — J449 Chronic obstructive pulmonary disease, unspecified: Secondary | ICD-10-CM | POA: Insufficient documentation

## 2017-11-26 LAB — CBC
HEMATOCRIT: 33.9 % — AB (ref 40.0–52.0)
Hemoglobin: 10.7 g/dL — ABNORMAL LOW (ref 13.0–18.0)
MCH: 22.7 pg — AB (ref 26.0–34.0)
MCHC: 31.5 g/dL — ABNORMAL LOW (ref 32.0–36.0)
MCV: 71.9 fL — ABNORMAL LOW (ref 80.0–100.0)
PLATELETS: 271 10*3/uL (ref 150–440)
RBC: 4.72 MIL/uL (ref 4.40–5.90)
RDW: 17.8 % — AB (ref 11.5–14.5)
WBC: 5.8 10*3/uL (ref 3.8–10.6)

## 2017-11-26 LAB — COMPREHENSIVE METABOLIC PANEL
ALT: 30 U/L (ref 17–63)
AST: 52 U/L — AB (ref 15–41)
Albumin: 3.5 g/dL (ref 3.5–5.0)
Alkaline Phosphatase: 65 U/L (ref 38–126)
Anion gap: 14 (ref 5–15)
BILIRUBIN TOTAL: 0.8 mg/dL (ref 0.3–1.2)
BUN: 78 mg/dL — AB (ref 6–20)
CHLORIDE: 100 mmol/L — AB (ref 101–111)
CO2: 25 mmol/L (ref 22–32)
CREATININE: 3.41 mg/dL — AB (ref 0.61–1.24)
Calcium: 8.8 mg/dL — ABNORMAL LOW (ref 8.9–10.3)
GFR calc non Af Amer: 18 mL/min — ABNORMAL LOW (ref >60)
GFR, EST AFRICAN AMERICAN: 21 mL/min — AB (ref >60)
Glucose, Bld: 99 mg/dL (ref 65–99)
Potassium: 3.8 mmol/L (ref 3.5–5.1)
Sodium: 139 mmol/L (ref 135–145)
Total Protein: 7.3 g/dL (ref 6.5–8.1)

## 2017-11-26 LAB — BRAIN NATRIURETIC PEPTIDE: B NATRIURETIC PEPTIDE 5: 2282 pg/mL — AB (ref 0.0–100.0)

## 2017-11-26 LAB — TROPONIN I: TROPONIN I: 0.07 ng/mL — AB (ref <0.03)

## 2017-11-26 MED ORDER — ALPRAZOLAM 1 MG PO TABS: 1.0000 mg | ORAL_TABLET | Freq: Three times a day (TID) | ORAL | 0 refills | Status: DC | PRN

## 2017-11-26 MED ORDER — LORAZEPAM 2 MG/ML IJ SOLN
INTRAMUSCULAR | Status: AC
  Administered 2017-11-26: 1 mg via INTRAVENOUS
  Filled 2017-11-26: qty 1

## 2017-11-26 MED ORDER — LORAZEPAM 2 MG/ML IJ SOLN: 0.5000 mg | Freq: Once | INTRAMUSCULAR | Status: DC

## 2017-11-26 MED ORDER — LORAZEPAM 2 MG/ML IJ SOLN
1.0000 mg | Freq: Once | INTRAMUSCULAR | Status: AC
  Administered 2017-11-26: 1 mg via INTRAVENOUS

## 2017-11-26 NOTE — ED Notes (Signed)

## 2017-11-26 NOTE — ED Provider Notes (Signed)
[facility] Emergency Department Provider Note   First MD Initiated Contact with Patient 11/26/17 [phone]     (approximate)  I have reviewed the triage vital signs and the nursing notes.   HISTORY  Chief Complaint Anxiety and Shortness of Breath   HPI Kelly Leonard is a 62 y.o. male below list of chronic medical conditions including MI, CHF COPD dilated cardiomyopathy and anxiety presents to the emergency department with "panic attack making it hard for me to breathe".  Patient states that he has felt very anxious in the past 2 days which is accompanied by dyspnea.  Patient denies any chest pain.  Patient does admit to orthopnea.  Patient denies any lower externally pain or swelling.  Patient states that he discontinued taking prescription anxiety medication.  Patient states that he was prescribed 5 medication for anxiety  Past Medical History:  Diagnosis Date  . Anxiety   . CHF (congestive heart failure) (HCC)   . Chronic systolic congestive heart failure (HCC)   . CKD (chronic kidney disease) stage 4, GFR 15-29 ml/min (HCC)   . COPD (chronic obstructive pulmonary disease) (HCC)   . Dilated cardiomyopathy (HCC)   . Hyperlipidemia   . Hypertension   . MI (myocardial infarction) (HCC)   . Polycystic kidney   . Renal disorder     Patient Active Problem List   Diagnosis Date Noted  . Gout 09/19/2017  . Rash 09/16/2017  . Acute on chronic systolic heart failure (HCC) 07/07/2017  . Acute renal failure with acute tubular necrosis superimposed on stage 4 chronic kidney disease (HCC)   . Hyponatremia 07/05/2017  . Cramps, muscle, general 07/05/2017  . Weakness generalized 07/05/2017  . Acute exacerbation of CHF (congestive heart failure) (HCC) 06/24/2017  . Acute on chronic systolic (congestive) heart failure (HCC) 05/12/2017  . Insomnia 05/05/2017  . Advanced care planning/counseling discussion 03/24/2017  . Hepatitis C antibody positive in blood  02/17/2017  . Hypertension   . Hyperlipidemia   . Chronic systolic congestive heart failure (HCC)   . Dilated cardiomyopathy (HCC)   . Polycystic kidney   . CKD (chronic kidney disease) stage 4, GFR 15-29 ml/min (HCC)   . 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 Anxiety disorder, unspecified 03/03/2013    Past Surgical History:  Procedure Laterality Date  . CARDIAC CATHETERIZATION    . IMPLANTABLE CARDIOVERTER DEFIBRILLATOR (ICD) GENERATOR CHANGE Right 06/23/2015   Procedure: ICD GENERATOR  INITIAL IMPLANT;  Surgeon: Sharion Settler, MD;  Location: [facility] ORS;  Service: Cardiovascular;  Laterality: Right;    Prior to Admission medications   Medication Sig Start Date End Date Taking? Authorizing Provider  ALPRAZolam Prudy Feeler) 1 MG tablet Take 1 tablet (1 mg total) by mouth 3 (three) times daily as needed for up to 3 doses for sleep. 11/26/17   Darci Current, MD  bumetanide (BUMEX) 1 MG tablet Take 3 tablets (3 mg total) daily by mouth. May take additional 3 mg in the PM as needed for swelling/SOB 07/22/17   Graciella Freer, PA-C  busPIRone (BUSPAR) 5 MG tablet Take 1 tablet (5 mg total) 3 (three) times daily by mouth. 07/15/17   Graciella Freer, PA-C  Glecaprevir-Pibrentasvir (MAVYRET) 100-40 MG TABS Take 3 tablets by mouth daily with breakfast. 08/27/17   Kuppelweiser, Cassie L, RPH-CPP  isosorbide-hydrALAZINE (BIDIL) 20-37.5 MG tablet Take 2 tablets 3 (three) times daily by mouth.    [provider]  Melatonin 10 MG CAPS Take 10 mg  by mouth at bedtime.    [provider]  metolazone (ZAROXOLYN) 2.5 MG tablet Take 1 tablet (2.5 mg total) by mouth as directed. For weight of 140 lbs or greater 11/19/17 02/17/18  Bensimhon, Bevelyn Buckles, MD  potassium chloride 20 MEQ TBCR Take 20 mEq by mouth as needed. Take 20 mEq only with metolazone. 10/06/17 01/04/18  Bensimhon, Bevelyn Buckles, MD  promethazine (PHENERGAN) 25 MG tablet Take 25 mg by mouth every  6 (six) hours as needed for nausea or vomiting.    [provider]    Allergies Entresto [sacubitril-valsartan]; Penicillins; Seroquel [quetiapine]; Ace inhibitors; Corlanor [ivabradine]; Ambien [zolpidem tartrate]; Carvedilol; Hydroxyzine; Sertraline; Trazodone and nefazodone; Benadryl [diphenhydramine hcl]; Gabapentin; Lorazepam; and Torsemide  Family History  Problem Relation Age of Onset  . Diabetes Mother   . Hypertension Brother   . Heart disease Maternal Grandmother   . Heart attack Maternal Grandmother     Social History Social History   Tobacco Use  . Smoking status: Never Smoker  . Smokeless tobacco: Never Used  Substance Use Topics  . Alcohol use: No  . Drug use: No    Review of Systems Constitutional: No fever/chills Eyes: No visual changes. ENT: No sore throat. Cardiovascular: Denies chest pain. Respiratory: Positive for shortness of breath. Gastrointestinal: No abdominal pain.  No nausea, no vomiting.  No diarrhea.  No constipation. Genitourinary: Negative for dysuria. Musculoskeletal: Negative for neck pain.  Negative for back pain. Integumentary: Negative for rash. Neurological: Negative for headaches, focal weakness or numbness. Psychiatry: Positive for anxiety  ____________________________________________   PHYSICAL EXAM:  VITAL SIGNS: ED Triage Vitals  Enc Vitals Group     BP 11/26/17 0313 136/79     Pulse Rate 11/26/17 0313 95     Resp 11/26/17 0313 20     Temp 11/26/17 0313 97.9 F (36.6 C)     Temp Source 11/26/17 0313 Oral     SpO2 11/26/17 0313 100 %     Weight 11/26/17 0314 62.1 kg (137 lb)     Height 11/26/17 0314 1.753 m (5\' 9" )     Head Circumference --      Peak Flow --      Pain Score 11/26/17 0314 0     Pain Loc --      Pain Edu? --      Excl. in GC? --     Constitutional: Alert and oriented.  Very anxious affect pacing the room  eyes: Conjunctivae are normal. Head: Atraumatic. Mouth/Throat: Mucous membranes are  moist. Oropharynx non-erythematous. Neck: No stridor.   Cardiovascular: Normal rate, regular rhythm. Good peripheral circulation. Grossly normal heart sounds. Respiratory: Normal respiratory effort.  No retractions. Lungs CTAB. Gastrointestinal: Soft and nontender. No distention.  Musculoskeletal: No lower extremity tenderness nor edema. No gross deformities of extremities. Neurologic:  Normal speech and language. No gross focal neurologic deficits are appreciated.  Skin:  Skin is warm, dry and intact. No rash noted. Psychiatric: Very anxious affect speech and behavior are normal.  ____________________________________________   LABS (all labs ordered are listed, but only abnormal results are displayed)  Labs Reviewed  CBC - Abnormal; Notable for the following components:      Result Value   Hemoglobin 10.7 (*)    HCT 33.9 (*)    MCV 71.9 (*)    MCH 22.7 (*)    MCHC 31.5 (*)    RDW 17.8 (*)    All other components within normal limits  COMPREHENSIVE METABOLIC PANEL - Abnormal;  Notable for the following components:   Chloride 100 (*)    BUN 78 (*)    Creatinine, Ser 3.41 (*)    Calcium 8.8 (*)    AST 52 (*)    GFR calc non Af Amer 18 (*)    GFR calc Af Amer 21 (*)    All other components within normal limits  TROPONIN I - Abnormal; Notable for the following components:   Troponin I 0.07 (*)    All other components within normal limits  BRAIN NATRIURETIC PEPTIDE - Abnormal; Notable for the following components:   B Natriuretic Peptide 2,282.0 (*)    All other components within normal limits   ____________________________________________  EKG  ED ECG REPORT I, [place] Faraaz Wolin, the attending physician, personally viewed and interpreted this ECG.   Date: 11/26/2017  EKG Time: 6:57 AM  Rate: 103  Rhythm: Sinus tachycardia  Axis: Normal  Intervals: Normal  ST&T Change: None  ____________________________________________  RADIOLOGY I, [place] Ninah Moccio, personally  viewed and evaluated these images (plain radiographs) as part of my medical decision making, as well as reviewing the written report by the radiologist.  ED MD interpretation: No acute cardiopulmonary process.  Unchanged cardiomegaly per radiologist.  Official radiology report(s): Dg Chest 2 View  Result Date: 11/26/2017 CLINICAL DATA:  Dyspnea. EXAM: CHEST - 2 VIEW COMPARISON:  10/03/2017 FINDINGS: Single lead left-sided pacemaker in place. Chronic cardiomegaly is unchanged. No pulmonary edema, focal consolidation, pleural effusion or pneumothorax. No acute osseous abnormalities. IMPRESSION: Unchanged cardiomegaly. No congestive failure or acute pulmonary process. Electronically Signed   By: Rubye Oaks M.D.   On: 11/26/2017 06:51      Procedures   ____________________________________________   INITIAL IMPRESSION / ASSESSMENT AND PLAN / ED COURSE  As part of my medical decision making, I reviewed the following data within the electronic MEDICAL RECORD NUMBER   62 year old male present with above-stated history and physical exam which the patient and his wife states is secondary to anxiety.  Patient given IV Ativan given market anxiety boat in the waiting room and currently in a treatment room where patient is pacing nonstop.  Following IV Ativan administration patient appears to be less anxious at this time. ____________________________________________  FINAL CLINICAL IMPRESSION(S) / ED DIAGNOSES  Final diagnoses:  Anxiety     MEDICATIONS GIVEN DURING THIS VISIT:  Medications  LORazepam (ATIVAN) injection 1 mg (1 mg Intravenous Given 11/26/17 0618)     ED Discharge Orders        Ordered    ALPRAZolam (XANAX) 1 MG tablet  3 times daily PRN     11/26/17 0704       Note:  This document was prepared using Dragon voice recognition software and may include unintentional dictation errors.    Darci Current, MD 11/26/17 [id number]

## 2017-11-26 NOTE — ED Triage Notes (Signed)
Pt arrived to the ED accompanied by his wife for complaints of having panic attacks and anxiety the causes him to get shortness of breath. Pt reports that he ran out of his anxiety medication and his anxiety is causing him to miss work. Pt is AOx4, anxious and pacing during triage.

## 2017-11-29 ENCOUNTER — Other Ambulatory Visit: Payer: Self-pay

## 2017-11-29 ENCOUNTER — Emergency Department (HOSPITAL_COMMUNITY): Payer: BLUE CROSS/BLUE SHIELD

## 2017-11-29 ENCOUNTER — Encounter (HOSPITAL_COMMUNITY): Payer: Self-pay

## 2017-11-29 ENCOUNTER — Emergency Department (HOSPITAL_COMMUNITY)
Admission: EM | Admit: 2017-11-29 | Discharge: 2017-11-29 | Disposition: A | Discharge status: Home / Self Care | Payer: BLUE CROSS/BLUE SHIELD | Attending: Emergency Medicine | Admitting: Emergency Medicine

## 2017-11-29 DIAGNOSIS — J449 Chronic obstructive pulmonary disease, unspecified: Secondary | ICD-10-CM | POA: Insufficient documentation

## 2017-11-29 DIAGNOSIS — F419 Anxiety disorder, unspecified: Secondary | ICD-10-CM | POA: Diagnosis present

## 2017-11-29 DIAGNOSIS — I5022 Chronic systolic (congestive) heart failure: Secondary | ICD-10-CM | POA: Insufficient documentation

## 2017-11-29 DIAGNOSIS — N184 Chronic kidney disease, stage 4 (severe): Secondary | ICD-10-CM | POA: Diagnosis not present

## 2017-11-29 DIAGNOSIS — Z79899 Other long term (current) drug therapy: Secondary | ICD-10-CM | POA: Diagnosis not present

## 2017-11-29 DIAGNOSIS — G4700 Insomnia, unspecified: Secondary | ICD-10-CM

## 2017-11-29 DIAGNOSIS — I130 Hypertensive heart and chronic kidney disease with heart failure and stage 1 through stage 4 chronic kidney disease, or unspecified chronic kidney disease: Secondary | ICD-10-CM | POA: Insufficient documentation

## 2017-11-29 DIAGNOSIS — R0602 Shortness of breath: Secondary | ICD-10-CM | POA: Diagnosis not present

## 2017-11-29 LAB — BASIC METABOLIC PANEL
ANION GAP: 13 (ref 5–15)
BUN: 72 mg/dL — ABNORMAL HIGH (ref 6–20)
CHLORIDE: 96 mmol/L — AB (ref 101–111)
CO2: 28 mmol/L (ref 22–32)
Calcium: 9.4 mg/dL (ref 8.9–10.3)
Creatinine, Ser: 3.88 mg/dL — ABNORMAL HIGH (ref 0.61–1.24)
GFR calc Af Amer: 18 mL/min — ABNORMAL LOW (ref >60)
GFR, EST NON AFRICAN AMERICAN: 15 mL/min — AB (ref >60)
Glucose, Bld: 139 mg/dL — ABNORMAL HIGH (ref 65–99)
POTASSIUM: 4 mmol/L (ref 3.5–5.1)
SODIUM: 137 mmol/L (ref 135–145)

## 2017-11-29 LAB — CBC WITH DIFFERENTIAL/PLATELET
BASOS PCT: 1 %
Basophils Absolute: 0.1 10*3/uL (ref 0.0–0.1)
EOS ABS: 0.2 10*3/uL (ref 0.0–0.7)
EOS PCT: 4 %
HCT: 36.5 % — ABNORMAL LOW (ref 39.0–52.0)
HEMOGLOBIN: 11.2 g/dL — AB (ref 13.0–17.0)
LYMPHS PCT: 22 %
Lymphs Abs: 1.3 10*3/uL (ref 0.7–4.0)
MCH: 22.8 pg — AB (ref 26.0–34.0)
MCHC: 30.7 g/dL (ref 30.0–36.0)
MCV: 74.2 fL — ABNORMAL LOW (ref 78.0–100.0)
Monocytes Absolute: 0.4 10*3/uL (ref 0.1–1.0)
Monocytes Relative: 6 %
Neutro Abs: 4 10*3/uL (ref 1.7–7.7)
Neutrophils Relative %: 67 %
PLATELETS: 292 10*3/uL (ref 150–400)
RBC: 4.92 MIL/uL (ref 4.22–5.81)
RDW: 17.5 % — ABNORMAL HIGH (ref 11.5–15.5)
WBC: 6 10*3/uL (ref 4.0–10.5)

## 2017-11-29 LAB — I-STAT TROPONIN, ED: TROPONIN I, POC: 0 ng/mL (ref 0.00–0.08)

## 2017-11-29 NOTE — ED Notes (Signed)
ED Provider at bedside. 

## 2017-11-29 NOTE — ED Provider Notes (Signed)
[name] EMERGENCY DEPARTMENT Provider Note   CSN: [id number] Arrival date & time: 11/29/17  1815     History   Chief Complaint Chief Complaint  Patient presents with  . Shortness of Breath    HPI Kelly Leonard is a 62 y.o. male.  The history is provided by the patient and medical records. No language interpreter was used.  Shortness of Breath    Kelly Leonard is a 62 y.o. male  with a PMH of prior MI, HTN, HLD, CKD, COPD who presents to the Emergency Department complaining of "anxiety attacks". He reports that he has not slept in three or four days and this is making his anxiety even worse. He states that he will have episodes of panic which will make it hard for him to breath. He is not experiencing any shortness of breath at this time. He denies any chest pain. He states that he was seen at [facility] three days ago for the same where they tried to give him medication to sleep, but the shot they gave him caused his face to swell. Per chart review, patient was given ativan injection. He had reassuring workup in ED and sxs thought to be 2/2 anxiety. He was then seen by PCP yesterday who arranged for outpatient sleep study and scheduled follow up appointment for next month. He is concerned as he still has not been able to sleep.  Past Medical History:  Diagnosis Date  . Anxiety   . CHF (congestive heart failure) (HCC)   . Chronic systolic congestive heart failure (HCC)   . CKD (chronic kidney disease) stage 4, GFR 15-29 ml/min (HCC)   . COPD (chronic obstructive pulmonary disease) (HCC)   . Dilated cardiomyopathy (HCC)   . Hyperlipidemia   . Hypertension   . MI (myocardial infarction) (HCC)   . Polycystic kidney   . Renal disorder     Patient Active Problem List   Diagnosis Date Noted  . Gout 09/19/2017  . Rash 09/16/2017  . Acute on chronic systolic heart failure (HCC) 07/07/2017  . Acute renal failure with acute tubular necrosis superimposed on  stage 4 chronic kidney disease (HCC)   . Hyponatremia 07/05/2017  . Cramps, muscle, general 07/05/2017  . Weakness generalized 07/05/2017  . Acute exacerbation of CHF (congestive heart failure) (HCC) 06/24/2017  . Acute on chronic systolic (congestive) heart failure (HCC) 05/12/2017  . Insomnia 05/05/2017  . Advanced care planning/counseling discussion 03/24/2017  . 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 Hypertension   . Hyperlipidemia   . Chronic systolic congestive heart failure (HCC)   . Dilated cardiomyopathy (HCC)   . Polycystic kidney   . CKD (chronic kidney disease) stage 4, GFR 15-29 ml/min (HCC)   . 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 Anxiety disorder, unspecified 03/03/2013    Past Surgical History:  Procedure Laterality Date  . CARDIAC CATHETERIZATION    . IMPLANTABLE CARDIOVERTER DEFIBRILLATOR (ICD) GENERATOR CHANGE Right 06/23/2015   Procedure: ICD GENERATOR  INITIAL IMPLANT;  Surgeon: Sharion Settler, MD;  Location: [facility] ORS;  Service: Cardiovascular;  Laterality: Right;        Home Medications    Prior to Admission medications   Medication Sig Start Date End Date Taking? Authorizing Provider  ALPRAZolam Prudy Feeler) 1 MG tablet Take 1 tablet (1 mg total) by mouth 3 (three) times daily as needed for up to 3 doses for sleep. 11/26/17   Darci Current, MD  bumetanide Janalyn Harder) 1  MG tablet Take 3 tablets (3 mg total) daily by mouth. May take additional 3 mg in the PM as needed for swelling/SOB 07/22/17   Graciella Freer, PA-C  busPIRone (BUSPAR) 5 MG tablet Take 1 tablet (5 mg total) 3 (three) times daily by mouth. 07/15/17   Graciella Freer, PA-C  Glecaprevir-Pibrentasvir (MAVYRET) 100-40 MG TABS Take 3 tablets by mouth daily with breakfast. 08/27/17   Kuppelweiser, Cassie L, RPH-CPP  isosorbide-hydrALAZINE (BIDIL) 20-37.5 MG tablet Take 2 tablets 3 (three) times daily by mouth.    [provider]    Melatonin 10 MG CAPS Take 10 mg by mouth at bedtime.    [provider]  metolazone (ZAROXOLYN) 2.5 MG tablet Take 1 tablet (2.5 mg total) by mouth as directed. For weight of 140 lbs or greater 11/19/17 02/17/18  Bensimhon, Bevelyn Buckles, MD  potassium chloride 20 MEQ TBCR Take 20 mEq by mouth as needed. Take 20 mEq only with metolazone. 10/06/17 01/04/18  Bensimhon, Bevelyn Buckles, MD  promethazine (PHENERGAN) 25 MG tablet Take 25 mg by mouth every 6 (six) hours as needed for nausea or vomiting.    [provider]    Family History Family History  Problem Relation Age of Onset  . Diabetes Mother   . Hypertension Brother   . Heart disease Maternal Grandmother   . Heart attack Maternal Grandmother     Social History Social History   Tobacco Use  . Smoking status: Never Smoker  . Smokeless tobacco: Never Used  Substance Use Topics  . Alcohol use: No  . Drug use: No     Allergies   Entresto [sacubitril-valsartan]; Penicillins; Seroquel [quetiapine]; Ace inhibitors; Corlanor [ivabradine]; Ambien [zolpidem tartrate]; Carvedilol; Hydroxyzine; Sertraline; Trazodone and nefazodone; Benadryl [diphenhydramine hcl]; Gabapentin; Lorazepam; and Torsemide   Review of Systems Review of Systems  Respiratory: Positive for shortness of breath.   Psychiatric/Behavioral: Positive for sleep disturbance. Negative for suicidal ideas. The patient is nervous/anxious.   All other systems reviewed and are negative.    Physical Exam Updated Vital Signs BP (!) 130/95 (BP Location: Right Arm)   Pulse 99   Temp 98 F (36.7 C) (Oral)   Resp 20   Ht 5\' 9"  (1.753 m)   Wt 64.9 kg (143 lb)   SpO2 96%   BMI 21.12 kg/m   Physical Exam  Constitutional: He is oriented to person, place, and time. He appears well-developed and well-nourished. No distress.  HENT:  Head: Normocephalic and atraumatic.  Neck: Neck supple. No JVD present.  Cardiovascular: Normal rate, regular rhythm and normal heart  sounds.  No murmur heard. Pulmonary/Chest: Effort normal and breath sounds normal. No respiratory distress.  Abdominal: Soft. He exhibits no distension. There is no tenderness.  Musculoskeletal: He exhibits no edema.  Neurological: He is alert and oriented to person, place, and time.  Skin: Skin is warm and dry.  Nursing note and vitals reviewed.    ED Treatments / Results  Labs (all labs ordered are listed, but only abnormal results are displayed) Labs Reviewed  CBC WITH DIFFERENTIAL/PLATELET - Abnormal; Notable for the following components:      Result Value   Hemoglobin 11.2 (*)    HCT 36.5 (*)    MCV 74.2 (*)    MCH 22.8 (*)    RDW 17.5 (*)    All other components within normal limits  BASIC METABOLIC PANEL - Abnormal; Notable for the following components:   Chloride 96 (*)    Glucose, Bld  139 (*)    BUN 72 (*)    Creatinine, Ser 3.88 (*)    GFR calc non Af Amer 15 (*)    GFR calc Af Amer 18 (*)    All other components within normal limits  I-STAT TROPONIN, ED    EKG EKG Interpretation  Date/Time:  Saturday November 29 2017 18:18:54 EDT Ventricular Rate:  88 PR Interval:  206 QRS Duration: 102 QT Interval:  398 QTC Calculation: 481 R Axis:   140 Text Interpretation:  Normal sinus rhythm Left atrial enlargement Anterolateral infarct , age undetermined T wave abnormality, consider inferior ischemia Abnormal ECG new inferior T wave inversions Confirmed by Frederick Peers [phone]) on 11/29/2017 7:30:27 PM   Radiology Dg Chest 2 View  Result Date: 11/29/2017 CLINICAL DATA:  Shortness of Breath EXAM: CHEST - 2 VIEW COMPARISON:  11/26/2017 FINDINGS: Cardiac shadow remains enlarged. Defibrillator is again seen. The lungs are clear bilaterally. No acute bony abnormality is seen. IMPRESSION: No active cardiopulmonary disease. Electronically Signed   By: Alcide Clever M.D.   On: 11/29/2017 19:26    Procedures Procedures (including critical care time)  Medications Ordered in  ED Medications - No data to display   Initial Impression / Assessment and Plan / ED Course  I have reviewed the triage vital signs and the nursing notes.  Pertinent labs & imaging results that were available during my care of the patient were reviewed by me and considered in my medical decision making (see chart for details).    Kelly Leonard is a 62 y.o. male who presents to ED with main concern of insomnia x 3 days. He reports having intermittent periods of panic where he is short of breath, but this will resolve. He denies any shortness of breath while in the ED today. He is afebrile, hemodynamically stable with benign cardiopulmonary examination. Does not appear volume overloaded. CXR negative. Seen at [facility] three days ago for similar. Troponin negative today. Labs baseline. EKG done upon ER arrival and repeat done a few hours later with no acute changes. He was also seen by PCP yesterday for same. Encouraged him to continue plan of care by PCP and discussed return precautions. All questions answered.   Patient discussed with Dr. Clarene Duke who agrees with treatment plan.    Final Clinical Impressions(s) / ED Diagnoses   Final diagnoses:  Insomnia, unspecified type    ED Discharge Orders    None       Ward, Chase Picket, PA-C 11/29/17 2240    Little, Ambrose Finland, MD 12/01/17 1714

## 2017-11-29 NOTE — ED Triage Notes (Addendum)
Pt presents to the ed with complaints of shortness of breath. Family states the patient was taken to the er at [facility] and was diagnosed with anxiety. They gave a shot of "something" and he had an allergic reaction to that on Thursday morning. Pt reports no problems since then other than lack of sleep. Today he began having severe anxiety and is short of breath. Denies any pain. Pt denies any allergic reaction symptoms at this time, breath sounds clear, no swelling present. Pt speaking in full complete sentences.

## 2017-11-29 NOTE — Discharge Instructions (Signed)
It was my pleasure taking care of you today!   Fortunately, your labs, EKG and x-ray were very reassuring today.   Please keep your scheduled appointment with your primary doctor.   Return to ER for new or worsening symptoms, any additional concerns.

## 2017-11-29 NOTE — ED Notes (Signed)
Patient transported to X-ray 

## 2017-12-01 ENCOUNTER — Ambulatory Visit (INDEPENDENT_AMBULATORY_CARE_PROVIDER_SITE_OTHER): Payer: BLUE CROSS/BLUE SHIELD | Admitting: Pharmacist

## 2017-12-01 DIAGNOSIS — B182 Chronic viral hepatitis C: Secondary | ICD-10-CM | POA: Diagnosis not present

## 2017-12-01 NOTE — Progress Notes (Signed)
[facility] Pharmacy Visit  HPI: Kelly Leonard is a 62 y.o. male who presents to the [facility] pharmacy clinic for Hep C follow-up.  He has geontype 1b, F3/F4 with CP class A, and started 12 weeks of Mavyret on 1/4.  Patient Active Problem List   Diagnosis Date Noted  . Gout 09/19/2017  . Rash 09/16/2017  . Acute on chronic systolic heart failure (HCC) 07/07/2017  . Acute renal failure with acute tubular necrosis superimposed on stage 4 chronic kidney disease (HCC)   . Hyponatremia 07/05/2017  . Cramps, muscle, general 07/05/2017  . Weakness generalized 07/05/2017  . Acute exacerbation of CHF (congestive heart failure) (HCC) 06/24/2017  . Acute on chronic systolic (congestive) heart failure (HCC) 05/12/2017  . Insomnia 05/05/2017  . Advanced care planning/counseling discussion 03/24/2017  . 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 Hypertension   . Hyperlipidemia   . Chronic systolic congestive heart failure (HCC)   . Dilated cardiomyopathy (HCC)   . Polycystic kidney   . CKD (chronic kidney disease) stage 4, GFR 15-29 ml/min (HCC)   . 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 Anxiety disorder, unspecified 03/03/2013    Patient's Medications  New Prescriptions   No medications on file  Previous Medications   ALPRAZOLAM (XANAX) 1 MG TABLET    Take 1 tablet (1 mg total) by mouth 3 (three) times daily as needed for up to 3 doses for sleep.   BUMETANIDE (BUMEX) 1 MG TABLET    Take 3 tablets (3 mg total) daily by mouth. May take additional 3 mg in the PM as needed for swelling/SOB   BUSPIRONE (BUSPAR) 5 MG TABLET    Take 1 tablet (5 mg total) 3 (three) times daily by mouth.   GLECAPREVIR-PIBRENTASVIR (MAVYRET) 100-40 MG TABS    Take 3 tablets by mouth daily with breakfast.   ISOSORBIDE-HYDRALAZINE (BIDIL) 20-37.5 MG TABLET    Take 2 tablets 3 (three) times daily by mouth.   MELATONIN 10 MG CAPS    Take 10 mg by mouth at  bedtime.   METOLAZONE (ZAROXOLYN) 2.5 MG TABLET    Take 1 tablet (2.5 mg total) by mouth as directed. For weight of 140 lbs or greater   POTASSIUM CHLORIDE 20 MEQ TBCR    Take 20 mEq by mouth as needed. Take 20 mEq only with metolazone.   PROMETHAZINE (PHENERGAN) 25 MG TABLET    Take 25 mg by mouth every 6 (six) hours as needed for nausea or vomiting.  Modified Medications   No medications on file  Discontinued Medications   No medications on file    Allergies: Allergies  Allergen Reactions  . Entresto [Sacubitril-Valsartan] Swelling    Swelling of the face  . Penicillins Anaphylaxis and Swelling    Has patient had a PCN reaction causing immediate rash, facial/tongue/throat swelling, SOB or lightheadedness with hypotension: Yes Has patient had a PCN reaction causing severe rash involving mucus membranes or skin necrosis: No Has patient had a PCN reaction that required hospitalization: No Has patient had a PCN reaction occurring within the last 10 years: No If all of the above answers are "NO", then may proceed with Cephalosporin use.   . Seroquel [Quetiapine] Nausea And Vomiting and Hives    Hives   . Ace Inhibitors Rash  . Corlanor [Ivabradine] Swelling    Facial swelling  . Ambien [Zolpidem Tartrate] Swelling  . Carvedilol Other (See Comments)    "dizziness, light headed, and nausea"  .  Hydroxyzine Nausea Only  . Sertraline Diarrhea    itching  . Trazodone And Nefazodone Swelling and Other (See Comments)    "up for days" and lip swelling  . Benadryl [Diphenhydramine Hcl] Other (See Comments)    "makes me crazy and hyper"  . Gabapentin Swelling    Lip and face swelling  . Lorazepam Rash    Rash and severe anxiety  . Torsemide Hives, Itching, Nausea And Vomiting and Rash    Past Medical History: Past Medical History:  Diagnosis Date  . Anxiety   . CHF (congestive heart failure) (HCC)   . Chronic systolic congestive heart failure (HCC)   . CKD (chronic kidney  disease) stage 4, GFR 15-29 ml/min (HCC)   . COPD (chronic obstructive pulmonary disease) (HCC)   . Dilated cardiomyopathy (HCC)   . Hyperlipidemia   . Hypertension   . MI (myocardial infarction) (HCC)   . Polycystic kidney   . Renal disorder     Social History: Social History   Socioeconomic History  . Marital status: Married    Spouse name: Not on file  . Number of children: Not on file  . Years of education: Not on file  . Highest education level: Not on file  Occupational History  . Not on file  Social Needs  . Financial resource strain: Not on file  . Food insecurity:    Worry: Not on file    Inability: Not on file  . Transportation needs:    Medical: Not on file    Non-medical: Not on file  Tobacco Use  . Smoking status: Never Smoker  . Smokeless tobacco: Never Used  Substance and Sexual Activity  . Alcohol use: No  . Drug use: No  . Sexual activity: Yes  Lifestyle  . Physical activity:    Days per week: Not on file    Minutes per session: Not on file  . Stress: Not on file  Relationships  . Social connections:    Talks on phone: Not on file    Gets together: Not on file    Attends religious service: Not on file    Active member of club or organization: Not on file    Attends meetings of clubs or organizations: Not on file    Relationship status: Not on file  Other Topics Concern  . Not on file  Social History Narrative  . Not on file    Labs: Hepatitis B Surface Ag (no units)  Date Value  07/08/2017 Negative   HCV Ab (s/co ratio)  Date Value  07/08/2017 >11.0 (H)    Lab Results  Component Value Date   HEPCGENOTYPE 1b 03/31/2017    Hepatitis C RNA quantitative Latest Ref Rng & Units 10/02/2017 07/09/2017 04/11/2017  HCV Quantitative >50 IU/mL - 1,290,000 1,600,000  HCV Quantitative Log NOT DETECT Log IU/mL <1.18 NOT DETECTED 6.111 6.204    AST (U/L)  Date Value  11/26/2017 52 (H)  07/15/2017 65 (H)  07/07/2017 188 (H)   SGOT(AST)  (Unit/L)  Date Value  11/12/2012 25  08/01/2012 42 (H)   ALT (U/L)  Date Value  11/26/2017 30  07/15/2017 50  07/07/2017 108 (H)   SGPT (ALT) (U/L)  Date Value  11/12/2012 25  08/01/2012 41   INR (no units)  Date Value  07/07/2017 1.20  05/14/2017 1.15  06/12/2015 0.94    Fibrosis Score: F3/F4 as assessed by elastography   Child-Pugh Score: A  Assessment: Livingston is here today to follow-up  for his EOT Hep C visit. He has 2 days left of his Hep C medication, Mavyret.  He has taken it for 12 weeks due to being F4 (CP class A).  He missed one day of the medication last week due to being in the hospital. Otherwise, no missed doses.  No side effects attributed to the medication. His early on treatment viral load was undetectable when last checked in January.  I will check another one today and have him come back in ~3 months to see Dr. Luciana Axe for his [facility] visit since he is cirrhotic. LFTs were checked a few days ago in the hospital and were almost normal with AST of 52 and ALT of 30.   Plan: - Complete Mavyret x 12 weeks - Hep C viral load today - F/u with Dr. Luciana Axe 7/1 at 345pm  Tasheena Wambolt L. Montford Barg, PharmD, AAHIVP, CPP Infectious Diseases Clinical Pharmacist [facility] 12/01/2017, 9:18 AM

## 2017-12-01 NOTE — Telephone Encounter (Signed)
Received fax back from Dr. Berniece Andreas office stating they are unable to draw labs from other providers.  I spoke with patient's wife and she would like to take him to [facility] in [place].  New prescription faxed today to [phone].  Wife is aware he needs bmet drawn next Monday December 08, 2017.

## 2017-12-04 ENCOUNTER — Telehealth: Payer: Self-pay | Admitting: Pharmacist

## 2017-12-04 ENCOUNTER — Other Ambulatory Visit: Payer: Self-pay | Admitting: Pharmacist

## 2017-12-04 DIAGNOSIS — B182 Chronic viral hepatitis C: Secondary | ICD-10-CM

## 2017-12-04 LAB — HEPATITIS C RNA QUANTITATIVE
HCV Quantitative Log: <1.18 Log IU/mL
HCV RNA, PCR, QN: NOT DETECTED [IU]/mL

## 2017-12-04 NOTE — Telephone Encounter (Signed)
Called Kathlene November to let him know that his Hep C viral load was still undetectable when checked on Monday. He has an appt with Dr. Luciana Axe on 7/1.

## 2017-12-15 ENCOUNTER — Telehealth: Payer: Self-pay | Admitting: Cardiology

## 2017-12-15 LAB — CUP PACEART INCLINIC DEVICE CHECK
Date Time Interrogation Session: 20190408095154
Implantable Lead Location: 753860
MDC IDC LEAD IMPLANT DT: 20161014
MDC IDC PG IMPLANT DT: 20161014

## 2017-12-15 NOTE — Telephone Encounter (Signed)
Patient called stating that he has been having some problems with SOB.  He sees Dr. Gala Romney and weighs himself daily.  His dry weight is 137-140lbs and says that his weight has been 139-142lbs and has no LE edema.  His complaint is that he has DOE off and on for the past 3 weeks.  He has been back at work but has missed days because he has been having DOE.  He has chronic SOB at night which has not changed.  I instructed him to call Dr. Gala Romney tomorrow for an appt.  He says that he is not feeling bad at present but he does not think he can continue to work.  He needs disability so he can pay his bills.

## 2017-12-16 ENCOUNTER — Telehealth (HOSPITAL_COMMUNITY): Payer: Self-pay | Admitting: *Deleted

## 2017-12-16 NOTE — Telephone Encounter (Signed)
Advanced Heart Failure Triage Encounter  Patient Name: Kelly Leonard  Date of Call: 12/16/17  Problem:  Patient called complaining on SOB, this has been going on for awhile now.  He stated he was recently started on prednisone and he had a reaction to that and he feels that may be the cause of his SOB.  His weight is stable at 137.4 and no swelling or dizziness.   Plan:  I advised him to call his PCP who prescribed the prednisone and discuss his SOB as this didn't sound cardiac related.  I did tell him that per his BUMEX prescription he may take an extra 3 mg this evening for the SOB but that if this wasn't cardiac related that will not help his SOB.  I advised him to call his PCP and he verbalized he would.  Georgina Peer, RN

## 2017-12-22 ENCOUNTER — Encounter: Payer: BLUE CROSS/BLUE SHIELD | Admitting: *Deleted

## 2017-12-23 ENCOUNTER — Telehealth (HOSPITAL_COMMUNITY): Payer: Self-pay | Admitting: Surgery

## 2017-12-23 ENCOUNTER — Other Ambulatory Visit: Payer: Self-pay | Admitting: Pharmacist

## 2017-12-23 DIAGNOSIS — B182 Chronic viral hepatitis C: Secondary | ICD-10-CM

## 2017-12-23 NOTE — Telephone Encounter (Signed)
I was contacted by Randa Evens HF [employer] Paramedic regarding Mr. Berendt.  He tells me that Mr. Nonaka's weight is up and that he is experiencing increased swelling.  He is also very depressed about his health issues and cried during Ray's visit yesterday.  I advised that we get him in for an appt and have scheduled an appt with Otilio Saber PA at 2pm tomorrow in the HF Clinic.  I contacted Mr. Plucinski and his is in full agreement and grateful for appt.

## 2017-12-23 NOTE — Progress Notes (Signed)
Advanced Heart Failure Clinic Note  Primary Physician: Gabriel Cirri NP  Primary Cardiologist:  Horald Chestnut Nephrology: Dr Glenna Fellows  Primary HF: Dr. Gala Romney   HPI: Kelly Leonard is a 62 y.o. 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    Patient admitted to [facility] in 10/18 with low output HF and AKI. Transferred to [facility] for further management.  On arrival to [facility], swan placed (07/07/17) which showed low output physiology. RA = 12 PA =   48/22 (32) PCW = 14 Thermo cardiac output/index = 4.3/2.4 Fick CO/CI = 2.7/1.6 Ao sat = 96% PA sat = 41%  Started on dobutamine. HF meds optimized. Dobutamine stopped 07/13/17. Creatinine peaked and 4.14 and improved to 2.5 with dobutamine  Hospital course complicated by severe anxiety and seen by Psych who adjusted meds with excellent improvement.  Hepatitis C diagnosis as well as UTI. Plan for ID follow up as outpatient. Pt completed course of levaquin for UTI. D/c weight 139.  He presents today for add on due to volume overload. Long discussion at last visit about advanced therapies and sliding scale diuretics. He was recently prescribed Uloric and "lifelong" prednisone for his severe gout. The pharmacist "turned down" his uloric prescription due to possibly of increased CV mortality.  Since being on prednisone 5 mg daily, his weight has trended up from 140 at home to 151.  It improved somewhat last week with metolazone, but has trended back up. He denies CP or dizziness. He is orthopneic and SOB with minimal exertion. He is very anxious about the increase in his symptoms. At baseline he can walk 2 miles without difficulty.   ICD: MDT Optivol personally interrogated. Optivol maxed out since January. Thoracic impedence is below threshold. Pt active for >8 hrs daily.  With multiple maxed out parameters, Suspect drift and he will likely need  re-calibration.   Review of systems complete and found to be negative unless listed in HPI.    Past Medical History:  Diagnosis Date  . Anxiety   . CHF (congestive heart failure) (HCC)   . Chronic systolic congestive heart failure (HCC)   . CKD (chronic kidney disease) stage 4, GFR 15-29 ml/min (HCC)   . COPD (chronic obstructive pulmonary disease) (HCC)   . Dilated cardiomyopathy (HCC)   . Hyperlipidemia   . Hypertension   . MI (myocardial infarction) (HCC)   . Polycystic kidney   . Renal disorder     Current Outpatient Medications  Medication Sig Dispense Refill  . ALPRAZolam (XANAX) 1 MG tablet Take 1 tablet (1 mg total) by mouth 3 (three) times daily as needed for up to 3 doses for sleep. 3 tablet 0  . bumetanide (BUMEX) 1 MG tablet Take 3 tablets (3 mg total) daily by mouth. May take additional 3 mg in the PM as needed for swelling/SOB 90 tablet 3  . busPIRone (BUSPAR) 5 MG tablet Take 1 tablet (5 mg total) 3 (three) times daily by mouth. 90 tablet 3  . Glecaprevir-Pibrentasvir (MAVYRET) 100-40 MG TABS Take 3 tablets by mouth daily with breakfast. 84 tablet 2  . isosorbide-hydrALAZINE (BIDIL) 20-37.5 MG tablet Take 2 tablets 3 (three) times daily by mouth.    . Melatonin 10 MG CAPS Take 10 mg by mouth at bedtime.    . metolazone (ZAROXOLYN) 2.5 MG tablet Take 1 tablet (2.5 mg total) by mouth as directed. For weight of 140 lbs or  greater 5 tablet 0  . potassium chloride 20 MEQ TBCR Take 20 mEq by mouth as needed. Take 20 mEq only with metolazone. 30 tablet 3  . promethazine (PHENERGAN) 25 MG tablet Take 25 mg by mouth every 6 (six) hours as needed for nausea or vomiting.     No current facility-administered medications for this visit.    Allergies  Allergen Reactions  . Entresto [Sacubitril-Valsartan] Swelling    Swelling of the face  . Penicillins Anaphylaxis and Swelling    Has patient had a PCN reaction causing immediate rash, facial/tongue/throat swelling, SOB or  lightheadedness with hypotension: Yes Has patient had a PCN reaction causing severe rash involving mucus membranes or skin necrosis: No Has patient had a PCN reaction that required hospitalization: No Has patient had a PCN reaction occurring within the last 10 years: No If all of the above answers are "NO", then may proceed with Cephalosporin use.   . Seroquel [Quetiapine] Nausea And Vomiting and Hives    Hives   . Ace Inhibitors Rash  . Corlanor [Ivabradine] Swelling    Facial swelling  . Ambien [Zolpidem Tartrate] Swelling  . Carvedilol Other (See Comments)    "dizziness, light headed, and nausea"  . Hydroxyzine Nausea Only  . Sertraline Diarrhea    itching  . Trazodone And Nefazodone Swelling and Other (See Comments)    "up for days" and lip swelling  . Benadryl [Diphenhydramine Hcl] Other (See Comments)    "makes me crazy and hyper"  . Gabapentin Swelling    Lip and face swelling  . Lorazepam Rash    Rash and severe anxiety  . Torsemide Hives, Itching, Nausea And Vomiting and Rash    Social History   Socioeconomic History  . Marital status: Married    Spouse name: Not on file  . Number of children: Not on file  . Years of education: Not on file  . Highest education level: Not on file  Occupational History  . Not on file  Social Needs  . Financial resource strain: Not on file  . Food insecurity:    Worry: Not on file    Inability: Not on file  . Transportation needs:    Medical: Not on file    Non-medical: Not on file  Tobacco Use  . Smoking status: Never Smoker  . Smokeless tobacco: Never Used  Substance and Sexual Activity  . Alcohol use: No  . Drug use: No  . Sexual activity: Yes  Lifestyle  . Physical activity:    Days per week: Not on file    Minutes per session: Not on file  . Stress: Not on file  Relationships  . Social connections:    Talks on phone: Not on file    Gets together: Not on file    Attends religious service: Not on file    Active  member of club or organization: Not on file    Attends meetings of clubs or organizations: Not on file    Relationship status: Not on file  . Intimate partner violence:    Fear of current or ex partner: Not on file    Emotionally abused: Not on file    Physically abused: Not on file    Forced sexual activity: Not on file  Other Topics Concern  . Not on file  Social History Narrative  . Not on file    Family History  Problem Relation Age of Onset  . Diabetes Mother   . Hypertension Brother   .  Heart disease Maternal Grandmother   . Heart attack Maternal Grandmother    Vitals:   12/24/17 1408  BP: (!) 142/74  Pulse: 95  SpO2: 98%  Weight: 146 lb (66.2 kg)   Wt Readings from Last 3 Encounters:  12/24/17 146 lb (66.2 kg)  11/29/17 143 lb (64.9 kg)  11/26/17 137 lb (62.1 kg)    PHYSICAL EXAM: General: Fatigued appearing. No resp difficulty. HEENT: Normal anicteric Neck: Supple. JVP to jaw. Carotids 2+ bilat; no bruits. No thyromegaly or nodule noted. Cor: PMI laterally displaced. RRR, +s3 Lungs: CTAB, normal effort. Abdomen: Soft, non-tender, Mildly distended, no HSM. No bruits or masses. +BS  Extremities: No cyanosis, clubbing, or rash. 2+ edema into thighs R>L (Chronically). Neuro: Alert & orientedx3, cranial nerves grossly intact. moves all 4 extremities w/o difficulty. Affect pleasant   ASSESSMENT & PLAN: 1. Chronic systolic Heart Failure - NICM. ECHO EF 06/2017 EF 20-25%. S/p Medtronic ICD - NYHA II at baseline, IIIb currently with volume overload.  - Volume status elevated on exam in setting of prednisone.  - Take bumex 3 mg BID for 3 days, then return to 3 mg daily.  BMET today and 1 weeks with CKD IV.  - Take metolazone 2.5 mg for the next 2 days, at least. Can repeat to keep fluid down.  - No Spiro/ACE/ARB/ARNI with CKD IV.  - No BB yet with low output. - Allergic to entresto, carvedilol, torsemide, ace.  - Continue Bidil 2 tabs TID without difficulty.  -  Reinforced fluid restriction to < 2 L daily, sodium restriction to less than 2000 mg daily, and the importance of daily weights.   - Dr Gala Romney spoke with him at last visit about possible heart-kidney transplant eval at [facility]  2. AKI on CKD stage IV in setting of polycystic KD - Creatinine baseline 3.0-3.5 in setting of PCKD and HF  - BMET today.  3. Anxiety  - Severe. Continue home meds. No change.  4. HCV - On Mavyret per ID.  - - No change to current plan.   5. Gout  - Unable to take allopurinol due to rash.  - Per rheumatology has been taking 5 mg prednisone daily "for life" and was unable to get Uloric.  - We have recommend he start Uloric. Benefit outweighs risk in his case. Ideally, would get on uloric and wean prednisone.   Meds as above. Reviewed sliding scale diuretics. Take extra for the next several days as above. Labs today. RTC 4 weeks. Told to call by end of week if fluid not improved for admission.   Graciella Freer, PA-C 12/23/17   Patient seen and examined with the above-signed Advanced Practice Provider and/or Housestaff. I personally reviewed laboratory data, imaging studies and relevant notes. I independently examined the patient and formulated the important aspects of the plan. I have edited the note to reflect any of my changes or salient points. I have personally discussed the plan with the patient and/or family.  He is markedly volume overloaded with NYHA IIIb symptoms. Will increase torsemide to 3bid and add metolazone for 2-3 days. If not improving will need to be admitted. Long talk about Uloric. Although it does increase risk of CHF mortality the risk is small and in the face of his severe gout I would use it. He cannot stay on the prednisone as it severely eacerbates his HF and is likely an even bigger risk for him.   Arvilla Meres, MD  10:56 PM

## 2017-12-24 ENCOUNTER — Encounter: Payer: Self-pay | Admitting: Cardiology

## 2017-12-24 ENCOUNTER — Encounter (HOSPITAL_COMMUNITY): Payer: Self-pay

## 2017-12-24 ENCOUNTER — Ambulatory Visit (HOSPITAL_COMMUNITY)
Admission: RE | Admit: 2017-12-24 | Discharge: 2017-12-24 | Disposition: A | Discharge status: Home / Self Care | Payer: BLUE CROSS/BLUE SHIELD | Source: Ambulatory Visit | Attending: Internal Medicine | Admitting: Internal Medicine

## 2017-12-24 ENCOUNTER — Encounter (HOSPITAL_COMMUNITY): Payer: Self-pay | Admitting: Student

## 2017-12-24 VITALS — BP 142/74 | HR 95 | Wt 146.0 lb

## 2017-12-24 DIAGNOSIS — N184 Chronic kidney disease, stage 4 (severe): Secondary | ICD-10-CM | POA: Diagnosis not present

## 2017-12-24 DIAGNOSIS — Z79899 Other long term (current) drug therapy: Secondary | ICD-10-CM | POA: Insufficient documentation

## 2017-12-24 DIAGNOSIS — M109 Gout, unspecified: Secondary | ICD-10-CM

## 2017-12-24 DIAGNOSIS — I420 Dilated cardiomyopathy: Secondary | ICD-10-CM | POA: Diagnosis not present

## 2017-12-24 DIAGNOSIS — I10 Essential (primary) hypertension: Secondary | ICD-10-CM | POA: Diagnosis not present

## 2017-12-24 DIAGNOSIS — Z888 Allergy status to other drugs, medicaments and biological substances status: Secondary | ICD-10-CM | POA: Insufficient documentation

## 2017-12-24 DIAGNOSIS — Z880 Allergy status to penicillin: Secondary | ICD-10-CM | POA: Insufficient documentation

## 2017-12-24 DIAGNOSIS — Q613 Polycystic kidney, unspecified: Secondary | ICD-10-CM | POA: Insufficient documentation

## 2017-12-24 DIAGNOSIS — E785 Hyperlipidemia, unspecified: Secondary | ICD-10-CM | POA: Insufficient documentation

## 2017-12-24 DIAGNOSIS — I252 Old myocardial infarction: Secondary | ICD-10-CM | POA: Insufficient documentation

## 2017-12-24 DIAGNOSIS — I130 Hypertensive heart and chronic kidney disease with heart failure and stage 1 through stage 4 chronic kidney disease, or unspecified chronic kidney disease: Secondary | ICD-10-CM | POA: Diagnosis present

## 2017-12-24 DIAGNOSIS — J449 Chronic obstructive pulmonary disease, unspecified: Secondary | ICD-10-CM | POA: Insufficient documentation

## 2017-12-24 DIAGNOSIS — F419 Anxiety disorder, unspecified: Secondary | ICD-10-CM | POA: Diagnosis not present

## 2017-12-24 DIAGNOSIS — N179 Acute kidney failure, unspecified: Secondary | ICD-10-CM | POA: Diagnosis not present

## 2017-12-24 DIAGNOSIS — I5022 Chronic systolic (congestive) heart failure: Secondary | ICD-10-CM | POA: Insufficient documentation

## 2017-12-24 LAB — BASIC METABOLIC PANEL
ANION GAP: 13 (ref 5–15)
BUN: 85 mg/dL — ABNORMAL HIGH (ref 6–20)
CALCIUM: 8.9 mg/dL (ref 8.9–10.3)
CHLORIDE: 93 mmol/L — AB (ref 101–111)
CO2: 32 mmol/L (ref 22–32)
Creatinine, Ser: 3.72 mg/dL — ABNORMAL HIGH (ref 0.61–1.24)
GFR calc Af Amer: 19 mL/min — ABNORMAL LOW (ref >60)
GFR calc non Af Amer: 16 mL/min — ABNORMAL LOW (ref >60)
GLUCOSE: 99 mg/dL (ref 65–99)
Potassium: 4.1 mmol/L (ref 3.5–5.1)
Sodium: 138 mmol/L (ref 135–145)

## 2017-12-24 NOTE — Patient Instructions (Signed)
INCREASE Bumex to 3 mg twice daily for THREE DAYS, then reduce back to normal daily dose.  Take metolazone tablet once daily for 2-3 days.  Routine lab work today. Will notify you of abnormal results, otherwise no news is good news!  Follow up 4 weeks with Otilio Saber PA-C.  Take all medication as prescribed the day of your appointment. Bring all medications with you to your appointment.  Do the following things EVERYDAY: 1) Weigh yourself in the morning before breakfast. Write it down and keep it in a log. 2) Take your medicines as prescribed 3) Eat low salt foods-Limit salt (sodium) to 2000 mg per day.  4) Stay as active as you can everyday 5) Limit all fluids for the day to less than 2 liters

## 2017-12-25 ENCOUNTER — Encounter: Payer: Self-pay | Admitting: Cardiology

## 2017-12-31 ENCOUNTER — Telehealth: Payer: Self-pay | Admitting: Cardiology

## 2017-12-31 ENCOUNTER — Emergency Department
Admission: EM | Admit: 2017-12-31 | Discharge: 2017-12-31 | Disposition: A | Discharge status: Home / Self Care | Payer: BLUE CROSS/BLUE SHIELD | Attending: Emergency Medicine | Admitting: Emergency Medicine

## 2017-12-31 ENCOUNTER — Other Ambulatory Visit: Payer: Self-pay

## 2017-12-31 DIAGNOSIS — J449 Chronic obstructive pulmonary disease, unspecified: Secondary | ICD-10-CM | POA: Insufficient documentation

## 2017-12-31 DIAGNOSIS — G2402 Drug induced acute dystonia: Secondary | ICD-10-CM | POA: Insufficient documentation

## 2017-12-31 DIAGNOSIS — I130 Hypertensive heart and chronic kidney disease with heart failure and stage 1 through stage 4 chronic kidney disease, or unspecified chronic kidney disease: Secondary | ICD-10-CM | POA: Diagnosis not present

## 2017-12-31 DIAGNOSIS — Z95810 Presence of automatic (implantable) cardiac defibrillator: Secondary | ICD-10-CM | POA: Diagnosis not present

## 2017-12-31 DIAGNOSIS — I5022 Chronic systolic (congestive) heart failure: Secondary | ICD-10-CM | POA: Insufficient documentation

## 2017-12-31 DIAGNOSIS — N184 Chronic kidney disease, stage 4 (severe): Secondary | ICD-10-CM | POA: Diagnosis not present

## 2017-12-31 DIAGNOSIS — R451 Restlessness and agitation: Secondary | ICD-10-CM | POA: Insufficient documentation

## 2017-12-31 DIAGNOSIS — Z79899 Other long term (current) drug therapy: Secondary | ICD-10-CM | POA: Diagnosis not present

## 2017-12-31 MED ORDER — BENZTROPINE MESYLATE 1 MG PO TABS: 1.0000 mg | ORAL_TABLET | Freq: Every day | ORAL | 0 refills | Status: DC

## 2017-12-31 MED ORDER — CHLORDIAZEPOXIDE HCL 10 MG PO CAPS: 10.0000 mg | ORAL_CAPSULE | Freq: Four times a day (QID) | ORAL | 0 refills | Status: AC | PRN

## 2017-12-31 MED ORDER — CHLORDIAZEPOXIDE HCL 25 MG PO CAPS
25.0000 mg | ORAL_CAPSULE | Freq: Once | ORAL | Status: AC
  Administered 2017-12-31: 25 mg via ORAL
  Filled 2017-12-31: qty 1

## 2017-12-31 MED ORDER — BENZTROPINE MESYLATE 1 MG/ML IJ SOLN
1.0000 mg | Freq: Once | INTRAMUSCULAR | Status: AC
  Administered 2017-12-31: 1 mg via INTRAMUSCULAR
  Filled 2017-12-31: qty 1

## 2017-12-31 MED ORDER — HALOPERIDOL LACTATE 5 MG/ML IJ SOLN
5.0000 mg | Freq: Once | INTRAMUSCULAR | Status: AC
  Administered 2017-12-31: 5 mg via INTRAMUSCULAR
  Filled 2017-12-31: qty 1

## 2017-12-31 NOTE — ED Notes (Signed)
Patient to Room 10.  Rosey Bath RN aware of placement.

## 2017-12-31 NOTE — ED Notes (Signed)
Emailed pharmacy to send cogentin

## 2017-12-31 NOTE — ED Notes (Addendum)
Pt condition remains unchanged - he is moving constantly and jumping up and down - talking fast and unable to sit/lay still - Dr Scotty Court is aware

## 2017-12-31 NOTE — ED Notes (Signed)
Pt in room singing very loudly and pacing from one side of room to the other - occasionally jumping in the air

## 2017-12-31 NOTE — Discharge Instructions (Addendum)
Take cogentin every day for 3 days as prescribed. Take librium as needed for agitation. Stop taking your new medications until you see your doctor again.

## 2017-12-31 NOTE — ED Notes (Signed)
Paitent is active, agitated, but cooperative. Wife at bedside.

## 2017-12-31 NOTE — ED Notes (Addendum)
Pt family states that the rheumatologist placed pt on prednisone for gout - family states that it is making pt very jittery and pt states that he "can't get it off of me" - he is jumping around in room and screaming - behavior is very bizarre - he keeps stating "I need sleep, I'm so tired" - unable to attach pt to monitors because he will not stop moving around room - Dr Scotty Court notified of pt behavior

## 2017-12-31 NOTE — Telephone Encounter (Signed)
Received page from patient.  Patient has been anxious and jittery all night and was wondering why that was the case.  He did say that he recently started treatment for a gout flare with prednisone; I advised him that this was the likely cause.  His rheumatologist prescribed this so I instructed him to follow-up with the rheumatology office in the morning to make further recommendations about management for gout flare and whether prednisone could be discontinued.

## 2017-12-31 NOTE — ED Provider Notes (Signed)
[facility] Emergency Department Provider Note  ____________________________________________  Time seen: Approximately 11:10 AM  I have reviewed the triage vital signs and the nursing notes.   HISTORY  Chief Complaint Allergic Reaction    HPI Maddin Greenlees is a 62 y.o. male who complains of agitation and feeling like he is going to jump out of his skin that started last night about one hour after taking prednisone and another medication for gout. Symptoms are severe, constant, no aggravating or alleviating factors. He's never had anything like this before. Denies SI HI or hallucinations. No visual disturbance. No headache. No trauma     Past Medical History:  Diagnosis Date  . Anxiety   . CHF (congestive heart failure) (HCC)   . Chronic systolic congestive heart failure (HCC)   . CKD (chronic kidney disease) stage 4, GFR 15-29 ml/min (HCC)   . COPD (chronic obstructive pulmonary disease) (HCC)   . Dilated cardiomyopathy (HCC)   . Hyperlipidemia   . Hypertension   . MI (myocardial infarction) (HCC)   . Polycystic kidney   . Renal disorder      Patient Active Problem List   Diagnosis Date Noted  . Gout 09/19/2017  . Rash 09/16/2017  . Acute renal failure with acute tubular necrosis superimposed on stage 4 chronic kidney disease (HCC)   . Hyponatremia 07/05/2017  . Cramps, muscle, general 07/05/2017  . Weakness generalized 07/05/2017  . Insomnia 05/05/2017  . Advanced care planning/counseling discussion 03/24/2017  . 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 Hypertension   . Hyperlipidemia   . Chronic systolic congestive heart failure (HCC)   . Dilated cardiomyopathy (HCC)   . Polycystic kidney   . CKD (chronic kidney disease) stage 4, GFR 15-29 ml/min (HCC)   . 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 Anxiety disorder, unspecified 03/03/2013     Past Surgical History:  Procedure Laterality Date   . CARDIAC CATHETERIZATION    . IMPLANTABLE CARDIOVERTER DEFIBRILLATOR (ICD) GENERATOR CHANGE Right 06/23/2015   Procedure: ICD GENERATOR  INITIAL IMPLANT;  Surgeon: Sharion Settler, MD;  Location: [facility] ORS;  Service: Cardiovascular;  Laterality: Right;     Prior to Admission medications   Medication Sig Start Date End Date Taking? Authorizing Provider  ALPRAZolam Prudy Feeler) 1 MG tablet Take 1 tablet (1 mg total) by mouth 3 (three) times daily as needed for up to 3 doses for sleep. 11/26/17   Darci Current, MD  benztropine (COGENTIN) 1 MG tablet Take 1 tablet (1 mg total) by mouth daily for 3 days. 12/31/17 01/03/18  Sharman Cheek, MD  bumetanide (BUMEX) 1 MG tablet Take 3 tablets (3 mg total) daily by mouth. May take additional 3 mg in the PM as needed for swelling/SOB 07/22/17   Graciella Freer, PA-C  busPIRone (BUSPAR) 5 MG tablet Take 1 tablet (5 mg total) 3 (three) times daily by mouth. 07/15/17   Graciella Freer, PA-C  chlordiazePOXIDE (LIBRIUM) 10 MG capsule Take 1 capsule (10 mg total) by mouth 4 (four) times daily as needed (agitation). 12/31/17   Sharman Cheek, MD  Glecaprevir-Pibrentasvir (MAVYRET) 100-40 MG TABS Take 3 tablets by mouth daily with breakfast. 08/27/17   Kuppelweiser, Cassie L, RPH-CPP  isosorbide-hydrALAZINE (BIDIL) 20-37.5 MG tablet Take 2 tablets 3 (three) times daily by mouth.    [provider]  Melatonin 10 MG CAPS Take 10 mg by mouth at bedtime.    [provider]  metolazone (ZAROXOLYN) 2.5 MG tablet  Take 1 tablet (2.5 mg total) by mouth as directed. For weight of 140 lbs or greater 11/19/17 02/17/18  Bensimhon, Bevelyn Buckles, MD  potassium chloride 20 MEQ TBCR Take 20 mEq by mouth as needed. Take 20 mEq only with metolazone. 10/06/17 01/04/18  Bensimhon, Bevelyn Buckles, MD  promethazine (PHENERGAN) 25 MG tablet Take 25 mg by mouth every 6 (six) hours as needed for nausea or vomiting.    [provider]     Allergies Entresto  [sacubitril-valsartan]; Penicillins; Seroquel [quetiapine]; Ace inhibitors; Corlanor [ivabradine]; Ambien [zolpidem tartrate]; Carvedilol; Hydroxyzine; Sertraline; Trazodone and nefazodone; Benadryl [diphenhydramine hcl]; Gabapentin; Lorazepam; and Torsemide   Family History  Problem Relation Age of Onset  . Diabetes Mother   . Hypertension Brother   . Heart disease Maternal Grandmother   . Heart attack Maternal Grandmother     Social History Social History   Tobacco Use  . Smoking status: Never Smoker  . Smokeless tobacco: Never Used  Substance Use Topics  . Alcohol use: No  . Drug use: No    Review of Systems  Constitutional:   No fever or chills.  ENT:   No sore throat. No rhinorrhea. Cardiovascular:   No chest pain or syncope. Respiratory:   No dyspnea or cough. Gastrointestinal:   Negative for abdominal pain, vomiting and diarrhea.  Musculoskeletal:   Negative for focal pain or swelling All other systems reviewed and are negative except as documented above in ROS and HPI.  ____________________________________________   PHYSICAL EXAM:  VITAL SIGNS: ED Triage Vitals  Enc Vitals Group     BP 12/31/17 0837 (!) 115/43     Pulse Rate 12/31/17 0837 (!) 104     Resp 12/31/17 0837 (!) 24     Temp 12/31/17 0837 97.8 F (36.6 C)     Temp Source 12/31/17 0837 Oral     SpO2 12/31/17 0837 100 %     Weight 12/31/17 0838 142 lb (64.4 kg)     Height 12/31/17 0838 5\' 9"  (1.753 m)     Head Circumference --      Peak Flow --      Pain Score 12/31/17 0838 0     Pain Loc --      Pain Edu? --      Excl. in GC? --     Vital signs reviewed, nursing assessments reviewed.   Constitutional:   Alert and oriented. agitated and uncomfortable, restless. Not in distress. Eyes:   Conjunctivae are normal. EOMI. PERRL.no nystagmus ENT      Head:   Normocephalic and atraumatic.      Nose:   No congestion/rhinnorhea.       Mouth/Throat:   MMM, no pharyngeal erythema. No peritonsillar  mass.       Neck:   No meningismus. Full ROM. Hematological/Lymphatic/Immunilogical:   No cervical lymphadenopathy. Cardiovascular:   RRR. Symmetric bilateral radial and DP pulses.  No murmurs.  Respiratory:   Normal respiratory effort without tachypnea/retractions. Breath sounds are clear and equal bilaterally. No wheezes/rales/rhonchi. Gastrointestinal:   Soft and nontender. Non distended. There is no CVA tenderness.  No rebound, rigidity, or guarding. Genitourinary:   deferred Musculoskeletal:   Normal range of motion in all extremities. No joint effusions.  No lower extremity tenderness.  No edema. Neurologic:   Normal speech and language.  Motor grossly intact. occasional jerking and jumping movements which appear Involuntary..  Skin:    Skin is warm, dry and intact. No rash noted.  No petechiae, purpura,  or bullae.  ____________________________________________    LABS (pertinent positives/negatives) (all labs ordered are listed, but only abnormal results are displayed) Labs Reviewed - No data to display ____________________________________________   EKG    ____________________________________________    RADIOLOGY  No results found.  ____________________________________________   PROCEDURES Procedures  ____________________________________________    CLINICAL IMPRESSION / ASSESSMENT AND PLAN / ED COURSE  Pertinent labs & imaging results that were available during my care of the patient were reviewed by me and considered in my medical decision making (see chart for details).    patient presents with agitation and likely a dystonic reaction. Given his allergy and drug sensitivity profile, we'll give oral Librium, intramuscular Cogentin, intramuscular Haldol, and reassess. No benefit to checking labs or imaging at this time. Doubt infectious process, overdose, or psychosis. Apart from being uncomfortable the patient appears to be medically and psychiatrically stable  and has medical decision-making capacity.  Clinical Course as of Jan 01 1451  Wed Dec 31, 2017  1044 Pt still agitated, restless, exhibiting intermittent dystonia. Repeat librium and cogentin.    [PS]    Clinical Course User Index [PS] Sharman Cheek, MD     ----------------------------------------- 2:52 PM on 12/31/2017 -----------------------------------------  Patient calm and was able to sleep. Well appearing, plan to discharge home, continue Cogentin and Librium, discontinue the new medications until he sees his doctor. Otherwise is medically stable without any specific symptoms.  ____________________________________________   FINAL CLINICAL IMPRESSION(S) / ED DIAGNOSES    Final diagnoses:  Agitation  Dystonic drug reaction     ED Discharge Orders        Ordered    chlordiazePOXIDE (LIBRIUM) 10 MG capsule  4 times daily PRN     12/31/17 1451    benztropine (COGENTIN) 1 MG tablet  Daily     12/31/17 1451      Portions of this note were generated with dragon dictation software. Dictation errors may occur despite best attempts at proofreading.    Sharman Cheek, MD 12/31/17 1452

## 2017-12-31 NOTE — ED Notes (Signed)
Patient is still restless, but is able to stay on the stretcher. Patient's wife states patient dozed off for a short time.

## 2017-12-31 NOTE — ED Notes (Signed)
Drink was given to wife.

## 2017-12-31 NOTE — ED Triage Notes (Signed)
Pt c/o having a reaction to some new meds, states they have him on daily prednisone and uloric . Pt is not able to sit still in triage and is jumping around, agitated.

## 2018-01-13 ENCOUNTER — Other Ambulatory Visit: Payer: Self-pay | Admitting: Cardiovascular Disease

## 2018-01-15 ENCOUNTER — Encounter (HOSPITAL_COMMUNITY): Payer: Self-pay

## 2018-01-15 ENCOUNTER — Telehealth: Payer: Self-pay | Admitting: Licensed Clinical Social Worker

## 2018-01-15 NOTE — Progress Notes (Signed)
S/T and L/T disability paperwork completed on behalf of patient.  Paperwork given to [employer] CHF SW to return to patient.  Ave Filter, RN

## 2018-01-15 NOTE — Telephone Encounter (Signed)
Patient called requesting assistance with disability forms. CSW assisted with clinic staff and provided to patient. CSW available as needed. Lasandra Beech, LCSW, CCSW-MCS [phone]

## 2018-01-21 ENCOUNTER — Encounter (HOSPITAL_COMMUNITY): Payer: Self-pay

## 2018-01-21 ENCOUNTER — Other Ambulatory Visit: Payer: Self-pay

## 2018-01-21 ENCOUNTER — Emergency Department (HOSPITAL_COMMUNITY): Payer: BLUE CROSS/BLUE SHIELD

## 2018-01-21 ENCOUNTER — Encounter (HOSPITAL_COMMUNITY): Payer: Self-pay | Admitting: Emergency Medicine

## 2018-01-21 ENCOUNTER — Ambulatory Visit (HOSPITAL_BASED_OUTPATIENT_CLINIC_OR_DEPARTMENT_OTHER)
Admission: RE | Admit: 2018-01-21 | Discharge: 2018-01-21 | Disposition: A | Discharge status: Home / Self Care | Payer: BLUE CROSS/BLUE SHIELD | Source: Ambulatory Visit | Attending: Cardiology | Admitting: Cardiology

## 2018-01-21 ENCOUNTER — Inpatient Hospital Stay (HOSPITAL_COMMUNITY)
Admission: EM | Admit: 2018-01-21 | Discharge: 2018-01-25 | DRG: 291 | Disposition: A | Discharge status: Home / Self Care | Payer: BLUE CROSS/BLUE SHIELD | Source: Ambulatory Visit | Attending: Family Medicine | Admitting: Family Medicine

## 2018-01-21 VITALS — BP 144/102 | Temp 97.8°F | Wt 141.0 lb

## 2018-01-21 DIAGNOSIS — I10 Essential (primary) hypertension: Secondary | ICD-10-CM | POA: Diagnosis present

## 2018-01-21 DIAGNOSIS — Z79899 Other long term (current) drug therapy: Secondary | ICD-10-CM | POA: Insufficient documentation

## 2018-01-21 DIAGNOSIS — I5023 Acute on chronic systolic (congestive) heart failure: Secondary | ICD-10-CM | POA: Diagnosis not present

## 2018-01-21 DIAGNOSIS — R531 Weakness: Secondary | ICD-10-CM | POA: Diagnosis not present

## 2018-01-21 DIAGNOSIS — I130 Hypertensive heart and chronic kidney disease with heart failure and stage 1 through stage 4 chronic kidney disease, or unspecified chronic kidney disease: Secondary | ICD-10-CM

## 2018-01-21 DIAGNOSIS — N179 Acute kidney failure, unspecified: Secondary | ICD-10-CM | POA: Insufficient documentation

## 2018-01-21 DIAGNOSIS — J449 Chronic obstructive pulmonary disease, unspecified: Secondary | ICD-10-CM | POA: Insufficient documentation

## 2018-01-21 DIAGNOSIS — F419 Anxiety disorder, unspecified: Secondary | ICD-10-CM | POA: Diagnosis not present

## 2018-01-21 DIAGNOSIS — N2581 Secondary hyperparathyroidism of renal origin: Secondary | ICD-10-CM | POA: Diagnosis present

## 2018-01-21 DIAGNOSIS — I132 Hypertensive heart and chronic kidney disease with heart failure and with stage 5 chronic kidney disease, or end stage renal disease: Principal | ICD-10-CM | POA: Diagnosis present

## 2018-01-21 DIAGNOSIS — Q613 Polycystic kidney, unspecified: Secondary | ICD-10-CM | POA: Diagnosis not present

## 2018-01-21 DIAGNOSIS — B1920 Unspecified viral hepatitis C without hepatic coma: Secondary | ICD-10-CM | POA: Diagnosis present

## 2018-01-21 DIAGNOSIS — N184 Chronic kidney disease, stage 4 (severe): Secondary | ICD-10-CM

## 2018-01-21 DIAGNOSIS — A0472 Enterocolitis due to Clostridium difficile, not specified as recurrent: Secondary | ICD-10-CM | POA: Diagnosis present

## 2018-01-21 DIAGNOSIS — I5022 Chronic systolic (congestive) heart failure: Secondary | ICD-10-CM | POA: Diagnosis not present

## 2018-01-21 DIAGNOSIS — I252 Old myocardial infarction: Secondary | ICD-10-CM

## 2018-01-21 DIAGNOSIS — E875 Hyperkalemia: Secondary | ICD-10-CM | POA: Diagnosis present

## 2018-01-21 DIAGNOSIS — Z888 Allergy status to other drugs, medicaments and biological substances status: Secondary | ICD-10-CM | POA: Insufficient documentation

## 2018-01-21 DIAGNOSIS — I420 Dilated cardiomyopathy: Secondary | ICD-10-CM | POA: Diagnosis present

## 2018-01-21 DIAGNOSIS — Z880 Allergy status to penicillin: Secondary | ICD-10-CM | POA: Insufficient documentation

## 2018-01-21 DIAGNOSIS — G8929 Other chronic pain: Secondary | ICD-10-CM | POA: Diagnosis present

## 2018-01-21 DIAGNOSIS — M109 Gout, unspecified: Secondary | ICD-10-CM

## 2018-01-21 DIAGNOSIS — D638 Anemia in other chronic diseases classified elsewhere: Secondary | ICD-10-CM | POA: Diagnosis present

## 2018-01-21 DIAGNOSIS — I509 Heart failure, unspecified: Secondary | ICD-10-CM

## 2018-01-21 DIAGNOSIS — Z95810 Presence of automatic (implantable) cardiac defibrillator: Secondary | ICD-10-CM

## 2018-01-21 DIAGNOSIS — E785 Hyperlipidemia, unspecified: Secondary | ICD-10-CM

## 2018-01-21 DIAGNOSIS — Z833 Family history of diabetes mellitus: Secondary | ICD-10-CM

## 2018-01-21 DIAGNOSIS — Z8249 Family history of ischemic heart disease and other diseases of the circulatory system: Secondary | ICD-10-CM

## 2018-01-21 DIAGNOSIS — J810 Acute pulmonary edema: Secondary | ICD-10-CM

## 2018-01-21 LAB — COMPREHENSIVE METABOLIC PANEL
ALT: 48 U/L (ref 17–63)
AST: 73 U/L — AB (ref 15–41)
Albumin: 3.2 g/dL — ABNORMAL LOW (ref 3.5–5.0)
Alkaline Phosphatase: 81 U/L (ref 38–126)
Anion gap: 21 — ABNORMAL HIGH (ref 5–15)
BUN: 162 mg/dL — ABNORMAL HIGH (ref 6–20)
CHLORIDE: 89 mmol/L — AB (ref 101–111)
CO2: 21 mmol/L — AB (ref 22–32)
Calcium: 9.4 mg/dL (ref 8.9–10.3)
Creatinine, Ser: 5.88 mg/dL — ABNORMAL HIGH (ref 0.61–1.24)
GFR, EST AFRICAN AMERICAN: 11 mL/min — AB (ref >60)
GFR, EST NON AFRICAN AMERICAN: 9 mL/min — AB (ref >60)
Glucose, Bld: 93 mg/dL (ref 65–99)
POTASSIUM: 5.8 mmol/L — AB (ref 3.5–5.1)
SODIUM: 131 mmol/L — AB (ref 135–145)
Total Bilirubin: 2.4 mg/dL — ABNORMAL HIGH (ref 0.3–1.2)
Total Protein: 6.8 g/dL (ref 6.5–8.1)

## 2018-01-21 LAB — URINALYSIS, ROUTINE W REFLEX MICROSCOPIC
Bilirubin Urine: NEGATIVE
GLUCOSE, UA: NEGATIVE mg/dL
HGB URINE DIPSTICK: NEGATIVE
KETONES UR: NEGATIVE mg/dL
LEUKOCYTES UA: NEGATIVE
NITRITE: NEGATIVE
PROTEIN: 100 mg/dL — AB
Specific Gravity, Urine: 1.011 (ref 1.005–1.030)
pH: 6 (ref 5.0–8.0)

## 2018-01-21 LAB — I-STAT TROPONIN, ED: TROPONIN I, POC: 0.07 ng/mL (ref 0.00–0.08)

## 2018-01-21 LAB — CBC WITH DIFFERENTIAL/PLATELET
BASOS ABS: 0 10*3/uL (ref 0.0–0.1)
Basophils Relative: 0 %
Eosinophils Absolute: 0.1 10*3/uL (ref 0.0–0.7)
Eosinophils Relative: 1 %
HEMATOCRIT: 36.2 % — AB (ref 39.0–52.0)
HEMOGLOBIN: 10.9 g/dL — AB (ref 13.0–17.0)
LYMPHS PCT: 9 %
Lymphs Abs: 0.8 10*3/uL (ref 0.7–4.0)
MCH: 20.5 pg — ABNORMAL LOW (ref 26.0–34.0)
MCHC: 30.1 g/dL (ref 30.0–36.0)
MCV: 68.2 fL — ABNORMAL LOW (ref 78.0–100.0)
MONOS PCT: 7 %
Monocytes Absolute: 0.6 10*3/uL (ref 0.1–1.0)
Neutro Abs: 7.1 10*3/uL (ref 1.7–7.7)
Neutrophils Relative %: 83 %
Platelets: 485 10*3/uL — ABNORMAL HIGH (ref 150–400)
RBC: 5.31 MIL/uL (ref 4.22–5.81)
RDW: 19.2 % — ABNORMAL HIGH (ref 11.5–15.5)
WBC: 8.6 10*3/uL (ref 4.0–10.5)

## 2018-01-21 LAB — BASIC METABOLIC PANEL
ANION GAP: 18 — AB (ref 5–15)
BUN: 160 mg/dL — ABNORMAL HIGH (ref 6–20)
CHLORIDE: 90 mmol/L — AB (ref 101–111)
CO2: 24 mmol/L (ref 22–32)
CREATININE: 5.65 mg/dL — AB (ref 0.61–1.24)
Calcium: 9.4 mg/dL (ref 8.9–10.3)
GFR calc non Af Amer: 10 mL/min — ABNORMAL LOW (ref >60)
GFR, EST AFRICAN AMERICAN: 11 mL/min — AB (ref >60)
Glucose, Bld: 142 mg/dL — ABNORMAL HIGH (ref 65–99)
Potassium: 5.7 mmol/L — ABNORMAL HIGH (ref 3.5–5.1)
SODIUM: 132 mmol/L — AB (ref 135–145)

## 2018-01-21 LAB — BRAIN NATRIURETIC PEPTIDE: B Natriuretic Peptide: >4500 pg/mL — ABNORMAL HIGH (ref 0.0–100.0)

## 2018-01-21 MED ORDER — INSULIN ASPART 100 UNIT/ML IV SOLN
10.0000 [IU] | Freq: Once | INTRAVENOUS | Status: AC
  Administered 2018-01-22: 10 [IU] via INTRAVENOUS

## 2018-01-21 MED ORDER — DEXTROSE 50 % IV SOLN
1.0000 | Freq: Once | INTRAVENOUS | Status: AC
  Administered 2018-01-22: 50 mL via INTRAVENOUS
  Filled 2018-01-21: qty 50

## 2018-01-21 NOTE — ED Triage Notes (Signed)
Pt states called by heart and vascular to return to ED for abnormal labs of K. Pt was told he is retaining fluid. Pt reports abdominal edema, bilateral leg edema. Pt endorses shortness of breath but no chest pain. Pt also states neck vein distention was reported to him by his MD today.

## 2018-01-21 NOTE — ED Notes (Signed)
Pt in room with family @ bedside Vital signs documented

## 2018-01-21 NOTE — ED Provider Notes (Signed)
[name] EMERGENCY DEPARTMENT Provider Note   CSN: [id number] Arrival date & time: 01/21/18  1906     History   Chief Complaint No chief complaint on file.   HPI Kelly Leonard is a 62 y.o. male.  HPI  This is a 62 year old male with complicated medical history including systolic heart failure, chronic kidney disease, COPD, dilated cardiomyopathy who presents with shortness of breath, abdominal swelling, scrotal swelling, and abnormal lab work from his doctor's office.  Patient reports over the last week he has had increasing shortness of breath, orthopnea, abdominal distention and scrotal/penile swelling.  He was seen at his cardiologist office today.  Will basic lab work was taken.  Patient states that prior to his visit today he had vomiting and diarrhea.  He continued to take his daily Bumex.  And took an extra dose of diuretic on Monday because of decreased urine output.  There was concern that this may affect his kidney function and he was told to hold his Bumex today.  He denies any recent fevers.  He denies any pain.  He does have a nonproductive cough which is worse with lying flat.  At baseline he does not lay flat to sleep.  Patient was hospitalized in the fall for dilated cardiomyopathy requiring dobutamine.  At that time he had acute on chronic kidney failure and was followed by nephrology in the hospital.  He does not have a current nephrologist.  He is not on dialysis.  Past Medical History:  Diagnosis Date  . Anxiety   . CHF (congestive heart failure) (HCC)   . Chronic systolic congestive heart failure (HCC)   . CKD (chronic kidney disease) stage 4, GFR 15-29 ml/min (HCC)   . COPD (chronic obstructive pulmonary disease) (HCC)   . Dilated cardiomyopathy (HCC)   . Hyperlipidemia   . Hypertension   . MI (myocardial infarction) (HCC)   . Polycystic kidney   . Renal disorder     Patient Active Problem List   Diagnosis Date Noted  . Gout  09/19/2017  . Rash 09/16/2017  . Acute renal failure with acute tubular necrosis superimposed on stage 4 chronic kidney disease (HCC)   . Hyponatremia 07/05/2017  . Cramps, muscle, general 07/05/2017  . Weakness generalized 07/05/2017  . Insomnia 05/05/2017  . Advanced care planning/counseling discussion 03/24/2017  . 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 Hypertension   . Hyperlipidemia   . Chronic systolic congestive heart failure (HCC)   . Dilated cardiomyopathy (HCC)   . Polycystic kidney   . CKD (chronic kidney disease) stage 4, GFR 15-29 ml/min (HCC)   . 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 Anxiety disorder, unspecified 03/03/2013    Past Surgical History:  Procedure Laterality Date  . CARDIAC CATHETERIZATION    . IMPLANTABLE CARDIOVERTER DEFIBRILLATOR (ICD) GENERATOR CHANGE Right 06/23/2015   Procedure: ICD GENERATOR  INITIAL IMPLANT;  Surgeon: Sharion Settler, MD;  Location: [facility] ORS;  Service: Cardiovascular;  Laterality: Right;        Home Medications    Prior to Admission medications   Medication Sig Start Date End Date Taking? Authorizing Provider  B Complex Vitamins (B COMPLEX PO) Take 1 tablet by mouth daily.   Yes [provider]  benztropine (COGENTIN) 1 MG tablet Take 1 tablet (1 mg total) by mouth daily for 3 days. Patient taking differently: Take 1 mg by mouth every 3 (three) days. AS NEEDED 12/31/17 01/21/18 Yes Sharman Cheek, MD  bumetanide (BUMEX) 1 MG tablet Take 3 tablets (3 mg total) daily by mouth. May take additional 3 mg in the PM as needed for swelling/SOB 07/22/17  Yes Tillery, Mariam Dollar, PA-C  chlordiazePOXIDE (LIBRIUM) 10 MG capsule Take 1 capsule (10 mg total) by mouth 4 (four) times daily as needed (agitation). Patient taking differently: Take 10 mg by mouth 2 (two) times daily.  12/31/17  Yes Sharman Cheek, MD  Cholecalciferol (VITAMIN D3) 1000 units CAPS Take 1,000 Units by mouth  daily.   Yes [provider]  colchicine 0.6 MG tablet Take 0.6 mg by mouth 2 (two) times daily as needed. Gout flare up 01/09/18  Yes [provider]  isosorbide-hydrALAZINE (BIDIL) 20-37.5 MG tablet Take 2 tablets 3 (three) times daily by mouth.   Yes [provider]  metolazone (ZAROXOLYN) 2.5 MG tablet Take 1 tablet (2.5 mg total) by mouth as directed. For weight of 140 lbs or greater Patient taking differently: Take 2.5 mg by mouth as needed (fluid). For weight of 140 lbs or greater 11/19/17 02/17/18 Yes Bensimhon, Bevelyn Buckles, MD  potassium chloride 20 MEQ TBCR Take 20 mEq by mouth as needed. Take 20 mEq only with metolazone. 10/06/17 01/21/18 Yes Bensimhon, Bevelyn Buckles, MD  promethazine (PHENERGAN) 25 MG tablet Take 25 mg by mouth every 6 (six) hours as needed for nausea or vomiting.   Yes [provider]  ALPRAZolam Prudy Feeler) 1 MG tablet Take 1 tablet (1 mg total) by mouth 3 (three) times daily as needed for up to 3 doses for sleep. Patient not taking: Reported on 01/21/2018 11/26/17   Darci Current, MD  busPIRone (BUSPAR) 5 MG tablet Take 1 tablet (5 mg total) 3 (three) times daily by mouth. Patient not taking: Reported on 01/21/2018 07/15/17   Graciella Freer, PA-C  Glecaprevir-Pibrentasvir (MAVYRET) 100-40 MG TABS Take 3 tablets by mouth daily with breakfast. Patient not taking: Reported on 01/21/2018 08/27/17   Kuppelweiser, Cassie L, RPH-CPP  metolazone (ZAROXOLYN) 2.5 MG tablet TAKE 1 TABLET (2.5 MG TOTAL) BY MOUTH DAILY AS NEEDED. Patient not taking: Reported on 01/21/2018 01/13/18   Antonieta Iba, MD    Family History Family History  Problem Relation Age of Onset  . Diabetes Mother   . Hypertension Brother   . Heart disease Maternal Grandmother   . Heart attack Maternal Grandmother     Social History Social History   Tobacco Use  . Smoking status: Never Smoker  . Smokeless tobacco: Never Used  Substance Use Topics  . Alcohol use: No  .  Drug use: No     Allergies   Entresto [sacubitril-valsartan]; Penicillins; Seroquel [quetiapine]; Ace inhibitors; Corlanor [ivabradine]; Ambien [zolpidem tartrate]; Carvedilol; Hydroxyzine; Prednisone; Sertraline; Trazodone and nefazodone; Uloric [febuxostat]; Benadryl [diphenhydramine hcl]; Gabapentin; Lorazepam; and Torsemide   Review of Systems Review of Systems  Constitutional: Negative for fever.  Respiratory: Positive for cough and shortness of breath.   Cardiovascular: Positive for leg swelling. Negative for chest pain.  Gastrointestinal: Positive for diarrhea, nausea and vomiting. Negative for abdominal pain.  Genitourinary: Negative for dysuria.  Neurological: Positive for light-headedness.  All other systems reviewed and are negative.    Physical Exam Updated Vital Signs BP (!) 136/116 (BP Location: Right Arm)   Pulse 88   Temp 98.1 F (36.7 C)   Resp 20   SpO2 93%   Physical Exam  Constitutional: He is oriented to person, place, and time.  Ill-appearing but nontoxic, no acute distress  HENT:  Head:  Normocephalic and atraumatic.  Eyes: Pupils are equal, round, and reactive to light.  Neck: Neck supple. JVD present.  Cardiovascular: Normal rate, regular rhythm and normal heart sounds.  No murmur heard. Pulmonary/Chest: Effort normal and breath sounds normal. No respiratory distress. He has no wheezes.  Coarse breath sounds crackles bilateral bases, occasional cough noted  Abdominal: Soft. Bowel sounds are normal. He exhibits distension. There is no tenderness. There is no rebound and no guarding.  Distention with positive fluid wave  Genitourinary:  Genitourinary Comments: Swelling noted of the foreskin of the penile shaft, able to retract, no skin significant scrotal edema or crepitus  Musculoskeletal: He exhibits no edema.  1+ bilateral lower extremity pitting edema  Lymphadenopathy:    He has no cervical adenopathy.  Neurological: He is alert and oriented  to person, place, and time.  Skin: Skin is warm and dry.  Psychiatric: He has a normal mood and affect.  Nursing note and vitals reviewed.    ED Treatments / Results  Labs (all labs ordered are listed, but only abnormal results are displayed) Labs Reviewed  COMPREHENSIVE METABOLIC PANEL - Abnormal; Notable for the following components:      Result Value   Sodium 131 (*)    Potassium 5.8 (*)    Chloride 89 (*)    CO2 21 (*)    BUN 162 (*)    Creatinine, Ser 5.88 (*)    Albumin 3.2 (*)    AST 73 (*)    Total Bilirubin 2.4 (*)    GFR calc non Af Amer 9 (*)    GFR calc Af Amer 11 (*)    Anion gap 21 (*)    All other components within normal limits  BRAIN NATRIURETIC PEPTIDE - Abnormal; Notable for the following components:   B Natriuretic Peptide >4,500.0 (*)    All other components within normal limits  CBC WITH DIFFERENTIAL/PLATELET - Abnormal; Notable for the following components:   Hemoglobin 10.9 (*)    HCT 36.2 (*)    MCV 68.2 (*)    MCH 20.5 (*)    RDW 19.2 (*)    Platelets 485 (*)    All other components within normal limits  URINALYSIS, ROUTINE W REFLEX MICROSCOPIC - Abnormal; Notable for the following components:   APPearance HAZY (*)    Protein, ur 100 (*)    Bacteria, UA RARE (*)    All other components within normal limits  I-STAT TROPONIN, ED    EKG EKG Interpretation  Date/Time:  Wednesday Jan 21 2018 19:41:17 EDT Ventricular Rate:  83 PR Interval:  224 QRS Duration: 104 QT Interval:  386 QTC Calculation: 453 R Axis:   -95 Text Interpretation:  Sinus rhythm with 1st degree A-V block Possible Left atrial enlargement Anterolateral infarct , age undetermined Abnormal ECG T waves more prominent Confirmed by Ross Marcus ([id number]) on 01/21/2018 11:20:34 PM   Radiology Dg Chest 2 View  Result Date: 01/21/2018 CLINICAL DATA:  Abdominal and lower extremity edema. Abnormal labs. Shortness of breath. EXAM: CHEST - 2 VIEW COMPARISON:  Chest x-rays dated  11/29/2017 and 10/03/2017 FINDINGS: Stable cardiomegaly. LEFT chest wall ICD apparatus appears stable in position with intact lead. Subtle new opacity within the RIGHT lower lobe, suspicious for pneumonia, alternatively atelectasis or asymmetric edema. LEFT lung is clear. No acute or suspicious osseous finding. IMPRESSION: New RIGHT lower lobe opacity, suspicious for pneumonia, alternatively atelectasis or asymmetric edema. Recommend follow-up chest x-ray to ensure resolution. Stable cardiomegaly. Electronically Signed  By: Bary Richard M.D.   On: 01/21/2018 21:15    Procedures Procedures (including critical care time)  CRITICAL CARE Performed by: Shon Baton   Total critical care time: 40 minutes  Critical care time was exclusive of separately billable procedures and treating other patients.  Critical care was necessary to treat or prevent imminent or life-threatening deterioration.  Critical care was time spent personally by me on the following activities: development of treatment plan with patient and/or surrogate as well as nursing, discussions with consultants, evaluation of patient's response to treatment, examination of patient, obtaining history from patient or surrogate, ordering and performing treatments and interventions, ordering and review of laboratory studies, ordering and review of radiographic studies, pulse oximetry and re-evaluation of patient's condition.   Medications Ordered in ED Medications  insulin aspart (novoLOG) injection 10 Units (has no administration in time range)  dextrose 50 % solution 50 mL (has no administration in time range)  furosemide (LASIX) 160 mg in dextrose 5 % 50 mL IVPB (has no administration in time range)     Initial Impression / Assessment and Plan / ED Course  I have reviewed the triage vital signs and the nursing notes.  Pertinent labs & imaging results that were available during my care of the patient were reviewed by me and  considered in my medical decision making (see chart for details).  Clinical Course as of Jan 22 45  Thu Jan 22, 2018  0006 Spoke with Dr. Juel Burrow regarding patient's hyperkalemia, AKI, and clinical evidence of volume overload.  He recommends attempting large dose of Lasix to see if he responds and diuresis.  Agrees with insulin, glucose for hyperkalemia.   [CH]  [id number] Spoke with Dr. Mayford Knife, cardiology.  Given complexity and primary AKI with hyperkalemia, recommends hospitalist admission.  Will have heart failure team consult in the morning.   [CH]    Clinical Course User Index [CH] Oswin Johal, Mayer Masker, MD    Patient presents with abnormal labs from his office.  Recent GI illness with nausea and vomiting.  Also has been taking his daily Bumex.  Creatinine is acutely increased to 5.88.  Baseline around 3 mid threes.  Additionally potassium is 5.8.  EKG shows no evidence of QRS widening but does show some peaked T waves.  Patient was given insulin and glucose for hyperkalemia.  Clinically he appears volume overloaded and third spacing with abdominal distention and groin swelling.  He also has JVD.  Chest x-ray is notable for pleural effusion.  Favor edema over pneumonia.  Patient was discussed with nephrology.  He is not currently on dialysis.  Nephrology recommends large dose of Lasix to see if patient responds and diuresis.  Patient was also given insulin and glucose for potassium shifts.    Spoke with cardiology, Dr. Mayford Knife.  See above.  Will discuss with admitting hospitalist.  At this time he is clinically stable.  O2 sats 93% on room air.  Not requiring BiPAP.  However, would recommend stepdown admission given metabolic derangements and volume status. Final Clinical Impressions(s) / ED Diagnoses   Final diagnoses:  Acute renal failure superimposed on stage 4 chronic kidney disease, unspecified acute renal failure type (HCC)  Acute on chronic systolic heart failure (HCC)  Acute pulmonary edema  [facility])    ED Discharge Orders    None       Shon Baton, MD 01/22/18 [phone]

## 2018-01-21 NOTE — Progress Notes (Signed)
Advanced Heart Failure Clinic Note  Primary Physician: Gabriel Cirri NP  Primary Cardiologist:  Horald Chestnut Nephrology: Dr Glenna Fellows  Primary HF: Dr. Gala Romney   HPI: Kelly Leonard is a 62 y.o. 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    Patient admitted to [facility] in 10/18 with low output HF and AKI. Transferred to [facility] for further management.  On arrival to [facility], swan placed (07/07/17) which showed low output physiology. RA = 12 PA =   48/22 (32) PCW = 14 Thermo cardiac output/index = 4.3/2.4 Fick CO/CI = 2.7/1.6 Ao sat = 96% PA sat = 41%  Started on dobutamine. HF meds optimized. Dobutamine stopped 07/13/17. Creatinine peaked and 4.14 and improved to 2.5 with dobutamine  Hospital course complicated by severe anxiety and seen by Psych who adjusted meds with excellent improvement.  Hepatitis C diagnosis as well as UTI. Plan for ID follow up as outpatient. Pt completed course of levaquin for UTI. D/c weight 139.  Seen in ED 12/31/17 for agitation and possible allergic reaction to prednisone/?uloric.  He presents today for HF follow up. He went to ED last month with dystonic drug reaction. Called EMS earlier this week with CP. Was thought to be severe GERD. Pt refused transport to ED.  He has been feeling tired. He has been eating again the past two days, but prior to that had 3-4 days of N/V diarrhea, and chills. He continued to take his bumex throughout this. He says he is lightheaded most days. Has been more SOB with illness and gout flare this week. Weight at home has been stable between 138 - 141. Took a metolazone yesterday due to decreased UOP this week.  He has been taking all medications as directed.   Review of systems complete and found to be negative unless listed in HPI.    Past Medical History:  Diagnosis Date  . Anxiety   . CHF (congestive heart failure) (HCC)   .  Chronic systolic congestive heart failure (HCC)   . CKD (chronic kidney disease) stage 4, GFR 15-29 ml/min (HCC)   . COPD (chronic obstructive pulmonary disease) (HCC)   . Dilated cardiomyopathy (HCC)   . Hyperlipidemia   . Hypertension   . MI (myocardial infarction) (HCC)   . Polycystic kidney   . Renal disorder     Current Outpatient Medications  Medication Sig Dispense Refill  . ALPRAZolam (XANAX) 1 MG tablet Take 1 tablet (1 mg total) by mouth 3 (three) times daily as needed for up to 3 doses for sleep. 3 tablet 0  . bumetanide (BUMEX) 1 MG tablet Take 3 tablets (3 mg total) daily by mouth. May take additional 3 mg in the PM as needed for swelling/SOB 90 tablet 3  . busPIRone (BUSPAR) 5 MG tablet Take 1 tablet (5 mg total) 3 (three) times daily by mouth. 90 tablet 3  . chlordiazePOXIDE (LIBRIUM) 10 MG capsule Take 1 capsule (10 mg total) by mouth 4 (four) times daily as needed (agitation). 8 capsule 0  . Glecaprevir-Pibrentasvir (MAVYRET) 100-40 MG TABS Take 3 tablets by mouth daily with breakfast. 84 tablet 2  . isosorbide-hydrALAZINE (BIDIL) 20-37.5 MG tablet Take 2 tablets 3 (three) times daily by mouth.    . Melatonin 10 MG CAPS Take 10 mg by mouth at bedtime.    . metolazone (ZAROXOLYN) 2.5 MG tablet Take 1 tablet (2.5 mg total) by mouth as directed.  For weight of 140 lbs or greater 5 tablet 0  . metolazone (ZAROXOLYN) 2.5 MG tablet TAKE 1 TABLET (2.5 MG TOTAL) BY MOUTH DAILY AS NEEDED. 30 tablet 0  . promethazine (PHENERGAN) 25 MG tablet Take 25 mg by mouth every 6 (six) hours as needed for nausea or vomiting.    . benztropine (COGENTIN) 1 MG tablet Take 1 tablet (1 mg total) by mouth daily for 3 days. 3 tablet 0  . potassium chloride 20 MEQ TBCR Take 20 mEq by mouth as needed. Take 20 mEq only with metolazone. 30 tablet 3   No current facility-administered medications for this encounter.    Allergies  Allergen Reactions  . Entresto [Sacubitril-Valsartan] Swelling    Swelling  of the face  . Penicillins Anaphylaxis and Swelling    Has patient had a PCN reaction causing immediate rash, facial/tongue/throat swelling, SOB or lightheadedness with hypotension: Yes Has patient had a PCN reaction causing severe rash involving mucus membranes or skin necrosis: No Has patient had a PCN reaction that required hospitalization: No Has patient had a PCN reaction occurring within the last 10 years: No If all of the above answers are "NO", then may proceed with Cephalosporin use.   . Seroquel [Quetiapine] Nausea And Vomiting and Hives    Hives   . Ace Inhibitors Rash  . Corlanor [Ivabradine] Swelling    Facial swelling  . Ambien [Zolpidem Tartrate] Swelling  . Carvedilol Other (See Comments)    "dizziness, light headed, and nausea"  . Hydroxyzine Nausea Only  . Sertraline Diarrhea    itching  . Trazodone And Nefazodone Swelling and Other (See Comments)    "up for days" and lip swelling  . Benadryl [Diphenhydramine Hcl] Other (See Comments)    "makes me crazy and hyper"  . Gabapentin Swelling    Lip and face swelling  . Lorazepam Rash    Rash and severe anxiety  . Torsemide Hives, Itching, Nausea And Vomiting and Rash    Social History   Socioeconomic History  . Marital status: Married    Spouse name: Not on file  . Number of children: Not on file  . Years of education: Not on file  . Highest education level: Not on file  Occupational History  . Not on file  Social Needs  . Financial resource strain: Not on file  . Food insecurity:    Worry: Not on file    Inability: Not on file  . Transportation needs:    Medical: Not on file    Non-medical: Not on file  Tobacco Use  . Smoking status: Never Smoker  . Smokeless tobacco: Never Used  Substance and Sexual Activity  . Alcohol use: No  . Drug use: No  . Sexual activity: Yes  Lifestyle  . Physical activity:    Days per week: Not on file    Minutes per session: Not on file  . Stress: Not on file    Relationships  . Social connections:    Talks on phone: Not on file    Gets together: Not on file    Attends religious service: Not on file    Active member of club or organization: Not on file    Attends meetings of clubs or organizations: Not on file    Relationship status: Not on file  . Intimate partner violence:    Fear of current or ex partner: Not on file    Emotionally abused: Not on file    Physically abused:  Not on file    Forced sexual activity: Not on file  Other Topics Concern  . Not on file  Social History Narrative  . Not on file    Family History  Problem Relation Age of Onset  . Diabetes Mother   . Hypertension Brother   . Heart disease Maternal Grandmother   . Heart attack Maternal Grandmother    Vitals:   01/21/18 0950  BP: (!) 144/102  Temp: 97.8 F (36.6 C)  Weight: 141 lb (64 kg)    Wt Readings from Last 3 Encounters:  01/21/18 141 lb (64 kg)  12/31/17 142 lb (64.4 kg)  12/24/17 146 lb (66.2 kg)    PHYSICAL EXAM: General: Fatigued and weak. NAD.  HEENT: Normal Neck: Supple. JVP 8-9 Carotids 2+ bilat; no bruits. No thyromegaly or nodule noted. Cor: PMI nondisplaced. RRR, 2/6 systolic murmur at apex.  Lungs: CTAB, normal effort. Abdomen: Soft, non-tender, non-distended, no HSM. No bruits or masses. +BS  Extremities: No cyanosis, clubbing, or rash. R and LLE no edema.  Neuro: Alert & orientedx3, cranial nerves grossly intact. moves all 4 extremities w/o difficulty. Affect pleasant   ASSESSMENT & PLAN: 1. Chronic systolic Heart Failure - NICM. ECHO EF 06/2017 EF 20-25%. S/p [employer] ICD - NYHA III-IIIb, confounded by acute illness. - Volume status difficult.  Weight down 5 lbs from last visit.  - Target weight seems to be 135-139 lbs. - Continue bumex 3 mg daily. Take additional dose at night or metoalzone 2.5 and kcl 20 for weight 140 or greater - Encouraged compression hose.  - BMET now.  - No Spiro/ACE/ARB/ARNI with CKD IV.  - No BB  yet with low output.  - Allergic to entresto, carvedilol, torsemide, ACE-I.   - Continue Bidil 2 tabs TID  - Reinforced fluid restriction to < 2 L daily, sodium restriction to less than 2000 mg daily, and the importance of daily weights.   - Last visit, Dr Gala Romney spoke with him at last visit about possible heart-kidney transplant eval at [facility]. May be limited by social issues.  2. AKI on CKD stage IV in setting of polycystic KD - Creatinine baseline 3.0-3.5 in setting of PCKD and HF  - BMET today.  3. Anxiety  - Severe. Continue home meds.  - Per PCP.  4. HCV - On Mavyret per ID. Has follow up in July. No change.  5. Gout  - Unable to take allopurinol due to rash.  - Now has had allergic reaction to uloric.  - Rheumatology following and trying on Tart Cherry Juice for his gout.  6. N/V and diarrhea - Resolved over past two days. Needs to watch fluid intake and bumex use closely in setting of GI illness.   Labs today. Worry about AKI with continuing to take his bumex despite GI bug.  If creatinine significantly worse will need admission. If stable, told to hold bumex today while he is equilibrating and resume tomorrow. Keep 4 week follow up. RTC sooner with symptoms.   Graciella Freer, PA-C 01/21/18   Greater than 50% of the 25 minute visit was spent in counseling/coordination of care regarding disease state education, salt/fluid restriction, sliding scale diuretics, and medication compliance.

## 2018-01-21 NOTE — Patient Instructions (Signed)
Routine lab work today. Will notify you of abnormal results, otherwise no news is good news!  No changes to medication at this time.  Follow up as scheduled.  Take all medication as prescribed the day of your appointment. Bring all medications with you to your appointment.  Do the following things EVERYDAY: 1) Weigh yourself in the morning before breakfast. Write it down and keep it in a log. 2) Take your medicines as prescribed 3) Eat low salt foods-Limit salt (sodium) to 2000 mg per day.  4) Stay as active as you can everyday 5) Limit all fluids for the day to less than 2 liters  

## 2018-01-22 ENCOUNTER — Inpatient Hospital Stay (HOSPITAL_COMMUNITY): Payer: BLUE CROSS/BLUE SHIELD

## 2018-01-22 ENCOUNTER — Encounter (HOSPITAL_COMMUNITY): Payer: Self-pay | Admitting: Internal Medicine

## 2018-01-22 DIAGNOSIS — Z8249 Family history of ischemic heart disease and other diseases of the circulatory system: Secondary | ICD-10-CM | POA: Diagnosis not present

## 2018-01-22 DIAGNOSIS — D638 Anemia in other chronic diseases classified elsewhere: Secondary | ICD-10-CM | POA: Diagnosis present

## 2018-01-22 DIAGNOSIS — Z833 Family history of diabetes mellitus: Secondary | ICD-10-CM | POA: Diagnosis not present

## 2018-01-22 DIAGNOSIS — I10 Essential (primary) hypertension: Secondary | ICD-10-CM | POA: Diagnosis not present

## 2018-01-22 DIAGNOSIS — M1000 Idiopathic gout, unspecified site: Secondary | ICD-10-CM

## 2018-01-22 DIAGNOSIS — N184 Chronic kidney disease, stage 4 (severe): Secondary | ICD-10-CM

## 2018-01-22 DIAGNOSIS — Z888 Allergy status to other drugs, medicaments and biological substances status: Secondary | ICD-10-CM | POA: Diagnosis not present

## 2018-01-22 DIAGNOSIS — I5023 Acute on chronic systolic (congestive) heart failure: Secondary | ICD-10-CM

## 2018-01-22 DIAGNOSIS — G8929 Other chronic pain: Secondary | ICD-10-CM | POA: Diagnosis present

## 2018-01-22 DIAGNOSIS — N179 Acute kidney failure, unspecified: Secondary | ICD-10-CM | POA: Diagnosis present

## 2018-01-22 DIAGNOSIS — Z880 Allergy status to penicillin: Secondary | ICD-10-CM | POA: Diagnosis not present

## 2018-01-22 DIAGNOSIS — N170 Acute kidney failure with tubular necrosis: Secondary | ICD-10-CM

## 2018-01-22 DIAGNOSIS — I132 Hypertensive heart and chronic kidney disease with heart failure and with stage 5 chronic kidney disease, or end stage renal disease: Secondary | ICD-10-CM | POA: Diagnosis present

## 2018-01-22 DIAGNOSIS — Z95810 Presence of automatic (implantable) cardiac defibrillator: Secondary | ICD-10-CM | POA: Diagnosis not present

## 2018-01-22 DIAGNOSIS — J449 Chronic obstructive pulmonary disease, unspecified: Secondary | ICD-10-CM | POA: Diagnosis present

## 2018-01-22 DIAGNOSIS — Q613 Polycystic kidney, unspecified: Secondary | ICD-10-CM | POA: Diagnosis not present

## 2018-01-22 DIAGNOSIS — E875 Hyperkalemia: Secondary | ICD-10-CM | POA: Diagnosis present

## 2018-01-22 DIAGNOSIS — N2581 Secondary hyperparathyroidism of renal origin: Secondary | ICD-10-CM | POA: Diagnosis present

## 2018-01-22 DIAGNOSIS — A0472 Enterocolitis due to Clostridium difficile, not specified as recurrent: Secondary | ICD-10-CM | POA: Diagnosis present

## 2018-01-22 DIAGNOSIS — I420 Dilated cardiomyopathy: Secondary | ICD-10-CM | POA: Diagnosis present

## 2018-01-22 DIAGNOSIS — I252 Old myocardial infarction: Secondary | ICD-10-CM | POA: Diagnosis not present

## 2018-01-22 DIAGNOSIS — B1920 Unspecified viral hepatitis C without hepatic coma: Secondary | ICD-10-CM | POA: Diagnosis present

## 2018-01-22 DIAGNOSIS — M109 Gout, unspecified: Secondary | ICD-10-CM | POA: Diagnosis present

## 2018-01-22 LAB — BASIC METABOLIC PANEL
Anion gap: 21 — ABNORMAL HIGH (ref 5–15)
BUN: 163 mg/dL — AB (ref 6–20)
CALCIUM: 9.4 mg/dL (ref 8.9–10.3)
CO2: 20 mmol/L — ABNORMAL LOW (ref 22–32)
Chloride: 92 mmol/L — ABNORMAL LOW (ref 101–111)
Creatinine, Ser: 6 mg/dL — ABNORMAL HIGH (ref 0.61–1.24)
GFR calc Af Amer: 11 mL/min — ABNORMAL LOW (ref >60)
GFR, EST NON AFRICAN AMERICAN: 9 mL/min — AB (ref >60)
Glucose, Bld: 55 mg/dL — ABNORMAL LOW (ref 65–99)
POTASSIUM: 5.2 mmol/L — AB (ref 3.5–5.1)
SODIUM: 133 mmol/L — AB (ref 135–145)

## 2018-01-22 LAB — C DIFFICILE QUICK SCREEN W PCR REFLEX
C DIFFICLE (CDIFF) ANTIGEN: POSITIVE — AB
C Diff toxin: POSITIVE — AB

## 2018-01-22 LAB — CBC WITH DIFFERENTIAL/PLATELET
BASOS ABS: 0 10*3/uL (ref 0.0–0.1)
Basophils Relative: 0 %
EOS ABS: 0.1 10*3/uL (ref 0.0–0.7)
Eosinophils Relative: 1 %
HEMATOCRIT: 34.4 % — AB (ref 39.0–52.0)
Hemoglobin: 10.9 g/dL — ABNORMAL LOW (ref 13.0–17.0)
LYMPHS ABS: 0.4 10*3/uL — AB (ref 0.7–4.0)
Lymphocytes Relative: 6 %
MCH: 20.9 pg — ABNORMAL LOW (ref 26.0–34.0)
MCHC: 31.7 g/dL (ref 30.0–36.0)
MCV: 65.9 fL — ABNORMAL LOW (ref 78.0–100.0)
MONO ABS: 0.7 10*3/uL (ref 0.1–1.0)
MONOS PCT: 9 %
Neutro Abs: 6.2 10*3/uL (ref 1.7–7.7)
Neutrophils Relative %: 84 %
PLATELETS: 368 10*3/uL (ref 150–400)
RBC: 5.22 MIL/uL (ref 4.22–5.81)
RDW: 18.1 % — AB (ref 11.5–15.5)
WBC: 7.4 10*3/uL (ref 4.0–10.5)

## 2018-01-22 LAB — TROPONIN I
TROPONIN I: 0.09 ng/mL — AB (ref <0.03)
TROPONIN I: 0.1 ng/mL — AB (ref <0.03)
Troponin I: 0.08 ng/mL (ref <0.03)

## 2018-01-22 LAB — PROCALCITONIN: PROCALCITONIN: 2.12 ng/mL

## 2018-01-22 LAB — HEPATIC FUNCTION PANEL
ALK PHOS: 72 U/L (ref 38–126)
ALT: 47 U/L (ref 17–63)
AST: 66 U/L — ABNORMAL HIGH (ref 15–41)
Albumin: 3.1 g/dL — ABNORMAL LOW (ref 3.5–5.0)
BILIRUBIN DIRECT: 0.8 mg/dL — AB (ref 0.1–0.5)
BILIRUBIN TOTAL: 1.8 mg/dL — AB (ref 0.3–1.2)
Indirect Bilirubin: 1 mg/dL — ABNORMAL HIGH (ref 0.3–0.9)
Total Protein: 6.3 g/dL — ABNORMAL LOW (ref 6.5–8.1)

## 2018-01-22 LAB — CBG MONITORING, ED: Glucose-Capillary: 98 mg/dL (ref 65–99)

## 2018-01-22 LAB — LIPASE, BLOOD: LIPASE: 47 U/L (ref 11–51)

## 2018-01-22 LAB — MRSA PCR SCREENING: MRSA by PCR: NEGATIVE

## 2018-01-22 MED ORDER — SACCHAROMYCES BOULARDII 250 MG PO CAPS
250.0000 mg | ORAL_CAPSULE | Freq: Two times a day (BID) | ORAL | Status: DC
  Administered 2018-01-22 – 2018-01-25 (×6): 250 mg via ORAL
  Filled 2018-01-22 (×6): qty 1

## 2018-01-22 MED ORDER — ACETAMINOPHEN 325 MG PO TABS
650.0000 mg | ORAL_TABLET | Freq: Four times a day (QID) | ORAL | Status: DC | PRN
  Administered 2018-01-25: 650 mg via ORAL
  Filled 2018-01-22: qty 2

## 2018-01-22 MED ORDER — VANCOMYCIN 50 MG/ML ORAL SOLUTION
125.0000 mg | Freq: Four times a day (QID) | ORAL | Status: DC
  Administered 2018-01-23 – 2018-01-25 (×11): 125 mg via ORAL
  Filled 2018-01-22 (×12): qty 2.5

## 2018-01-22 MED ORDER — FUROSEMIDE 10 MG/ML IJ SOLN
160.0000 mg | Freq: Two times a day (BID) | INTRAVENOUS | Status: DC
  Administered 2018-01-22: 160 mg via INTRAVENOUS
  Filled 2018-01-22: qty 16
  Filled 2018-01-22: qty 10

## 2018-01-22 MED ORDER — HEPARIN SODIUM (PORCINE) 5000 UNIT/ML IJ SOLN
5000.0000 [IU] | Freq: Three times a day (TID) | INTRAMUSCULAR | Status: DC
  Administered 2018-01-22 – 2018-01-25 (×9): 5000 [IU] via SUBCUTANEOUS
  Filled 2018-01-22 (×8): qty 1

## 2018-01-22 MED ORDER — CHOLESTYRAMINE LIGHT 4 G PO PACK
4.0000 g | PACK | Freq: Three times a day (TID) | ORAL | Status: DC
  Administered 2018-01-22 – 2018-01-25 (×8): 4 g via ORAL
  Filled 2018-01-22 (×10): qty 1

## 2018-01-22 MED ORDER — ISOSORB DINITRATE-HYDRALAZINE 20-37.5 MG PO TABS
2.0000 | ORAL_TABLET | Freq: Three times a day (TID) | ORAL | Status: DC
  Administered 2018-01-22 – 2018-01-25 (×9): 2 via ORAL
  Filled 2018-01-22 (×10): qty 2

## 2018-01-22 MED ORDER — CHLORDIAZEPOXIDE HCL 5 MG PO CAPS: 10.0000 mg | ORAL_CAPSULE | Freq: Three times a day (TID) | ORAL | Status: DC | PRN

## 2018-01-22 MED ORDER — FUROSEMIDE 10 MG/ML IJ SOLN
120.0000 mg | Freq: Three times a day (TID) | INTRAVENOUS | Status: DC
  Administered 2018-01-22 – 2018-01-23 (×2): 120 mg via INTRAVENOUS
  Filled 2018-01-22: qty 12
  Filled 2018-01-22: qty 10
  Filled 2018-01-22 (×2): qty 12

## 2018-01-22 MED ORDER — ACETAMINOPHEN 650 MG RE SUPP: 650.0000 mg | Freq: Four times a day (QID) | RECTAL | Status: DC | PRN

## 2018-01-22 MED ORDER — FUROSEMIDE 10 MG/ML IJ SOLN
160.0000 mg | Freq: Once | INTRAVENOUS | Status: AC
  Administered 2018-01-22: 160 mg via INTRAVENOUS
  Filled 2018-01-22: qty 16

## 2018-01-22 NOTE — Consult Note (Signed)
HPI: I was asked by Dr. Rodena Piety to see Kelly Leonard who is a 62 y.o. male with known CKD 4(creat 3.72 on 12/24/17, 3.34 on 09/19/17 and 2.54 on 07/15/17).  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  Recently, over the past week he had some nausea, vomiting and diarrhea. PO intake has been down.  He has known CHF due to DCM and an AICD.  His weight went up a couple of weeks ago and per his sliding scale instructions he took doses of metolazone with weight improvement. He was admitted by cardiology due to concerns of worsening renal function. On 5/15 BUN was 160 and creat 5.65 and today 163/6.0.  He does not have a dialysis access. CXR 5/15 revealed cardiomeg, RLL infiltrate(? Inf v edema).  His skin is itching. There was hyperkalemia to 5.8 on home potassium supp.   Past Medical History:  Diagnosis Date  . Anxiety   . CHF (congestive heart failure) ([place])   . Chronic systolic congestive heart failure ([place])   . CKD (chronic kidney disease) stage 4, GFR 15-29 ml/min (HCC)   . COPD (chronic obstructive pulmonary disease) ([place])   . Dilated cardiomyopathy ([place])   . Hyperlipidemia   . Hypertension   . MI (myocardial infarction) ([place])   . Polycystic kidney   . Renal disorder    Past Surgical History:  Procedure Laterality Date  . CARDIAC CATHETERIZATION    . IMPLANTABLE CARDIOVERTER DEFIBRILLATOR (ICD) GENERATOR CHANGE Right 06/23/2015   Procedure: ICD GENERATOR  INITIAL IMPLANT;  Surgeon: Marzetta Board, MD;  Location: [facility] ORS;  Service: Cardiovascular;  Laterality: Right;   Social History:  reports that he has never smoked. He has never used smokeless tobacco. He reports that he does not drink alcohol or use drugs. Allergies:  Allergies  Allergen Reactions  . Entresto [Sacubitril-Valsartan] Swelling    Swelling of the face  . Penicillins Anaphylaxis and Swelling    Has patient had a PCN reaction causing immediate rash, facial/tongue/throat swelling, SOB or  lightheadedness with hypotension: Yes Has patient had a PCN reaction causing severe rash involving mucus membranes or skin necrosis: No Has patient had a PCN reaction that required hospitalization: No Has patient had a PCN reaction occurring within the last 10 years: No If all of the above answers are "NO", then may proceed with Cephalosporin use.   . Seroquel [Quetiapine] Nausea And Vomiting and Hives    Hives   . Ace Inhibitors Rash  . Corlanor [Ivabradine] Swelling    Facial swelling  . Ambien [Zolpidem Tartrate] Swelling  . Carvedilol Other (See Comments)    "dizziness, light headed, and nausea"  . Hydroxyzine Nausea Only  . Prednisone Other (See Comments)    Causes fluid build up  . Sertraline Diarrhea    itching  . Trazodone And Nefazodone Swelling and Other (See Comments)    "up for days" and lip swelling  . Uloric [Febuxostat] Other (See Comments)    Jittery, jerky, makes him lose his mind  . Benadryl [Diphenhydramine Hcl] Other (See Comments)    "makes me crazy and hyper"  . Gabapentin Swelling    Lip and face swelling  . Lorazepam Rash    Rash and severe anxiety  . Torsemide Hives, Itching, Nausea And Vomiting and Rash   Family History  Problem Relation Age of Onset  . Diabetes Mother   . Hypertension Brother   . Heart disease Maternal Grandmother   . Heart attack Maternal Grandmother  Medications:  Prior to Admission:  Medications Prior to Admission  Medication Sig Dispense Refill Last Dose  . B Complex Vitamins (B COMPLEX PO) Take 1 tablet by mouth daily.   01/20/2018 at Unknown time  . benztropine (COGENTIN) 1 MG tablet Take 1 tablet (1 mg total) by mouth daily for 3 days. (Patient taking differently: Take 1 mg by mouth every 3 (three) days. AS NEEDED) 3 tablet 0 01/17/2018  . bumetanide (BUMEX) 1 MG tablet Take 3 tablets (3 mg total) daily by mouth. May take additional 3 mg in the PM as needed for swelling/SOB 90 tablet 3 01/20/2018 at Unknown time  .  chlordiazePOXIDE (LIBRIUM) 10 MG capsule Take 1 capsule (10 mg total) by mouth 4 (four) times daily as needed (agitation). (Patient taking differently: Take 10 mg by mouth 2 (two) times daily. ) 8 capsule 0 01/20/2018 at Unknown time  . Cholecalciferol (VITAMIN D3) 1000 units CAPS Take 1,000 Units by mouth daily.   01/20/2018 at Unknown time  . colchicine 0.6 MG tablet Take 0.6 mg by mouth 2 (two) times daily as needed. Gout flare up  2 unk at prn  . isosorbide-hydrALAZINE (BIDIL) 20-37.5 MG tablet Take 2 tablets 3 (three) times daily by mouth.   01/20/2018 at Unknown time  . metolazone (ZAROXOLYN) 2.5 MG tablet Take 1 tablet (2.5 mg total) by mouth as directed. For weight of 140 lbs or greater (Patient taking differently: Take 2.5 mg by mouth as needed (fluid). For weight of 140 lbs or greater) 5 tablet 0 unk at prn  . potassium chloride 20 MEQ TBCR Take 20 mEq by mouth as needed. Take 20 mEq only with metolazone. 30 tablet 3 unk at prn  . promethazine (PHENERGAN) 25 MG tablet Take 25 mg by mouth every 6 (six) hours as needed for nausea or vomiting.   unk at prn  . ALPRAZolam (XANAX) 1 MG tablet Take 1 tablet (1 mg total) by mouth 3 (three) times daily as needed for up to 3 doses for sleep. (Patient not taking: Reported on 01/21/2018) 3 tablet 0 Not Taking at Unknown time  . busPIRone (BUSPAR) 5 MG tablet Take 1 tablet (5 mg total) 3 (three) times daily by mouth. (Patient not taking: Reported on 01/21/2018) 90 tablet 3 Not Taking at Unknown time  . Glecaprevir-Pibrentasvir (MAVYRET) 100-40 MG TABS Take 3 tablets by mouth daily with breakfast. (Patient not taking: Reported on 01/21/2018) 84 tablet 2 Not Taking at Unknown time  . metolazone (ZAROXOLYN) 2.5 MG tablet TAKE 1 TABLET (2.5 MG TOTAL) BY MOUTH DAILY AS NEEDED. (Patient not taking: Reported on 01/21/2018) 30 tablet 0 Not Taking at Unknown time   Scheduled: . cholestyramine light  4 g Oral TID  . heparin  5,000 Units Subcutaneous Q8H  .  isosorbide-hydrALAZINE  2 tablet Oral TID  . saccharomyces boulardii  250 mg Oral BID    ROS: as per HPI Blood pressure (!) 138/100, pulse 87, temperature (!) 97.4 F (36.3 C), temperature source Oral, resp. rate 14, weight 64.1 kg (141 lb 4.8 oz), SpO2 (!) 35 %.  General appearance: thin chronically illl appearing man Head: Normocephalic, without obvious abnormality, atraumatic  Neck veins pulsatile  Eyes: EOMI, anicteric Ears: normal TM's and external ear canals both ears Nose: Nares normal. Septum midline. Mucosa normal. No drainage or sinus tenderness. Resp: clear to auscultation bilaterally Chest wall: no tenderness there is 1+ presacral edema Cardio: regular rate and rhythm, S1, S2 normal, no murmur, click, rub or gallop  GI: soft, non-tender; bowel sounds normal; no masses,  no organomegaly abd protuberance  Extremities: edema 1+ RLE edema, tr LLE edema Skin: Skin color, texture, turgor normal. No rashes or lesions Neurologic: Grossly normal Results for orders placed or performed during the hospital encounter of 01/21/18 (from the past 48 hour(s))  Urinalysis, Routine w reflex microscopic     Status: Abnormal   Collection Time: 01/21/18  7:34 PM  Result Value Ref Range   Color, Urine YELLOW YELLOW   APPearance HAZY (A) CLEAR   Specific Gravity, Urine 1.011 1.005 - 1.030   pH 6.0 5.0 - 8.0   Glucose, UA NEGATIVE NEGATIVE mg/dL   Hgb urine dipstick NEGATIVE NEGATIVE   Bilirubin Urine NEGATIVE NEGATIVE   Ketones, ur NEGATIVE NEGATIVE mg/dL   Protein, ur 100 (A) NEGATIVE mg/dL   Nitrite NEGATIVE NEGATIVE   Leukocytes, UA NEGATIVE NEGATIVE   RBC / HPF 0-5 0 - 5 RBC/hpf   WBC, UA 0-5 0 - 5 WBC/hpf   Bacteria, UA RARE (A) NONE SEEN   Squamous Epithelial / LPF 0-5 0 - 5   Mucus PRESENT    Sperm, UA PRESENT     Comment: Performed at [facility], [address] [place], [place] [address]  Comprehensive metabolic panel     Status: Abnormal   Collection Time: 01/21/18   7:40 PM  Result Value Ref Range   Sodium 131 (L) 135 - 145 mmol/L   Potassium 5.8 (H) 3.5 - 5.1 mmol/L   Chloride 89 (L) 101 - 111 mmol/L   CO2 21 (L) 22 - 32 mmol/L   Glucose, Bld 93 65 - 99 mg/dL   BUN 162 (H) 6 - 20 mg/dL   Creatinine, Ser 5.88 (H) 0.61 - 1.24 mg/dL   Calcium 9.4 8.9 - 10.3 mg/dL   Total Protein 6.8 6.5 - 8.1 g/dL   Albumin 3.2 (L) 3.5 - 5.0 g/dL   AST 73 (H) 15 - 41 U/L   ALT 48 17 - 63 U/L   Alkaline Phosphatase 81 38 - 126 U/L   Total Bilirubin 2.4 (H) 0.3 - 1.2 mg/dL   GFR calc non Af Amer 9 (L) >60 mL/min   GFR calc Af Amer 11 (L) >60 mL/min    Comment: (NOTE) The eGFR has been calculated using the CKD EPI equation. This calculation has not been validated in all clinical situations. eGFR's persistently <60 mL/min signify possible Chronic Kidney Disease.    Anion gap 21 (H) 5 - 15    Comment: RESULT CHECKED Performed at [facility], [place] [address], [place], [place] [address]   Brain natriuretic peptide     Status: Abnormal   Collection Time: 01/21/18  7:40 PM  Result Value Ref Range   B Natriuretic Peptide >4,500.0 (H) 0.0 - 100.0 pg/mL    Comment: Performed at [facility] [address], [place], [place] [address]  CBC with Differential     Status: Abnormal   Collection Time: 01/21/18  7:41 PM  Result Value Ref Range   WBC 8.6 4.0 - 10.5 K/uL   RBC 5.31 4.22 - 5.81 MIL/uL   Hemoglobin 10.9 (L) 13.0 - 17.0 g/dL   HCT 36.2 (L) 39.0 - 52.0 %   MCV 68.2 (L) 78.0 - 100.0 fL   MCH 20.5 (L) 26.0 - 34.0 pg   MCHC 30.1 30.0 - 36.0 g/dL   RDW 19.2 (H) 11.5 - 15.5 %   Platelets 485 (H) 150 -  400 K/uL   Neutrophils Relative % 83 %   Lymphocytes Relative 9 %   Monocytes Relative 7 %   Eosinophils Relative 1 %   Basophils Relative 0 %   Neutro Abs 7.1 1.7 - 7.7 K/uL   Lymphs Abs 0.8 0.7 - 4.0 K/uL   Monocytes Absolute 0.6 0.1 - 1.0 K/uL   Eosinophils Absolute 0.1 0.0 - 0.7 K/uL   Basophils Absolute 0.0 0.0 - 0.1 K/uL   RBC Morphology  POLYCHROMASIA PRESENT     Comment: TARGET CELLS Performed at [facility] [address] [place], [place] [address]   I-stat troponin, ED     Status: None   Collection Time: 01/21/18  8:07 PM  Result Value Ref Range   Troponin i, poc 0.07 0.00 - 0.08 ng/mL   Comment 3            Comment: Due to the release kinetics of cTnI, a negative result within the first hours of the onset of symptoms does not rule out myocardial infarction with certainty. If myocardial infarction is still suspected, repeat the test at appropriate intervals.   CBG monitoring, ED     Status: None   Collection Time: 01/22/18 12:53 AM  Result Value Ref Range   Glucose-Capillary 98 65 - 99 mg/dL  Troponin I (q 6hr x 3)     Status: Abnormal   Collection Time: 01/22/18  5:02 AM  Result Value Ref Range   Troponin I 0.09 (HH) <0.03 ng/mL    Comment: CRITICAL RESULT CALLED TO, READ BACK BY AND VERIFIED WITH: BERNARDO,L RN 01/22/2018 [id number] JORDANS Performed at [facility], [place] [address] [place], [place] [address]   Hepatic function panel     Status: Abnormal   Collection Time: 01/22/18  5:02 AM  Result Value Ref Range   Total Protein 6.3 (L) 6.5 - 8.1 g/dL   Albumin 3.1 (L) 3.5 - 5.0 g/dL   AST 66 (H) 15 - 41 U/L   ALT 47 17 - 63 U/L   Alkaline Phosphatase 72 38 - 126 U/L   Total Bilirubin 1.8 (H) 0.3 - 1.2 mg/dL   Bilirubin, Direct 0.8 (H) 0.1 - 0.5 mg/dL   Indirect Bilirubin 1.0 (H) 0.3 - 0.9 mg/dL    Comment: Performed at [facility] [address] [place], [place] [address]  Lipase, blood     Status: None   Collection Time: 01/22/18  5:02 AM  Result Value Ref Range   Lipase 47 11 - 51 U/L    Comment: Performed at [facility] [address], [place], [place] [address]  Basic metabolic panel     Status: Abnormal   Collection Time: 01/22/18  5:02 AM  Result Value Ref Range   Sodium 133 (L) 135 - 145 mmol/L   Potassium 5.2 (H) 3.5 - 5.1 mmol/L   Chloride 92 (L) 101 - 111  mmol/L   CO2 20 (L) 22 - 32 mmol/L   Glucose, Bld 55 (L) 65 - 99 mg/dL   BUN 163 (H) 6 - 20 mg/dL   Creatinine, Ser 6.00 (H) 0.61 - 1.24 mg/dL   Calcium 9.4 8.9 - 10.3 mg/dL   GFR calc non Af Amer 9 (L) >60 mL/min   GFR calc Af Amer 11 (L) >60 mL/min    Comment: (NOTE) The eGFR has been calculated using the CKD EPI equation. This calculation has not been validated in all clinical situations. eGFR's persistently <60  mL/min signify possible Chronic Kidney Disease.    Anion gap 21 (H) 5 - 15    Comment: RESULT CHECKED Performed at [facility], [place] [address] [place], [place] [address]   CBC with Differential/Platelet     Status: Abnormal   Collection Time: 01/22/18  5:02 AM  Result Value Ref Range   WBC 7.4 4.0 - 10.5 K/uL   RBC 5.22 4.22 - 5.81 MIL/uL   Hemoglobin 10.9 (L) 13.0 - 17.0 g/dL   HCT 34.4 (L) 39.0 - 52.0 %   MCV 65.9 (L) 78.0 - 100.0 fL   MCH 20.9 (L) 26.0 - 34.0 pg   MCHC 31.7 30.0 - 36.0 g/dL   RDW 18.1 (H) 11.5 - 15.5 %   Platelets 368 150 - 400 K/uL   Neutrophils Relative % 84 %   Lymphocytes Relative 6 %   Monocytes Relative 9 %   Eosinophils Relative 1 %   Basophils Relative 0 %   Neutro Abs 6.2 1.7 - 7.7 K/uL   Lymphs Abs 0.4 (L) 0.7 - 4.0 K/uL   Monocytes Absolute 0.7 0.1 - 1.0 K/uL   Eosinophils Absolute 0.1 0.0 - 0.7 K/uL   Basophils Absolute 0.0 0.0 - 0.1 K/uL   RBC Morphology POLYCHROMASIA PRESENT     Comment: TARGET CELLS RARE NRBCs BURR CELLS Performed at [facility] [address] [place], [place] [address]   Procalcitonin - Baseline     Status: None   Collection Time: 01/22/18  5:50 AM  Result Value Ref Range   Procalcitonin 2.12 ng/mL    Comment:        Interpretation: PCT > 2 ng/mL: Systemic infection (sepsis) is likely, unless other causes are known. (NOTE)       Sepsis PCT Algorithm           Lower Respiratory Tract                                      Infection PCT Algorithm    ----------------------------      ----------------------------         PCT < 0.25 ng/mL                PCT < 0.10 ng/mL         Strongly encourage             Strongly discourage   discontinuation of antibiotics    initiation of antibiotics    ----------------------------     -----------------------------       PCT 0.25 - 0.50 ng/mL            PCT 0.10 - 0.25 ng/mL               OR       >80% decrease in PCT            Discourage initiation of                                            antibiotics      Encourage discontinuation           of antibiotics    ----------------------------     -----------------------------         PCT >= 0.50 ng/mL  PCT 0.26 - 0.50 ng/mL               AND       <80% decrease in PCT              Encourage initiation of                                             antibiotics       Encourage continuation           of antibiotics    ----------------------------     -----------------------------        PCT >= 0.50 ng/mL                  PCT > 0.50 ng/mL               AND         increase in PCT                  Strongly encourage                                      initiation of antibiotics    Strongly encourage escalation           of antibiotics                                     -----------------------------                                           PCT <= 0.25 ng/mL                                                 OR                                        > 80% decrease in PCT                                     Discontinue / Do not initiate                                             antibiotics Performed at [facility], [address] [place], [place] [address]   Troponin I (q 6hr x 3)     Status: Abnormal   Collection Time: 01/22/18 10:47 AM  Result Value Ref Range   Troponin I 0.10 (HH) <0.03 ng/mL    Comment: CRITICAL VALUE NOTED.  VALUE IS CONSISTENT WITH PREVIOUSLY REPORTED AND CALLED VALUE. Performed at [facility], [place] [address]  [place], [place] [address]    Ct Abdomen  Pelvis Wo Contrast  Result Date: 01/22/2018 CLINICAL DATA:  Acute onset of nausea and vomiting. EXAM: CT ABDOMEN AND PELVIS WITHOUT CONTRAST TECHNIQUE: Multidetector CT imaging of the abdomen and pelvis was performed following the standard protocol without IV contrast. COMPARISON:  Renal ultrasound performed 07/08/2017 FINDINGS: Lower chest: Moderate right and small left pleural effusions are noted, with mild atelectasis at the lung bases. The heart is mildly enlarged. An AICD lead is noted. Hepatobiliary: Scattered nonspecific hypodensities are seen within the liver. The liver is otherwise grossly unremarkable. The gallbladder is not well assessed given surrounding ascites. The common bile duct remains normal in caliber. Pancreas: The pancreas is within normal limits. Spleen: The spleen is unremarkable in appearance. Adrenals/Urinary Tract: The adrenal glands are grossly unremarkable in appearance. Innumerable cysts are seen throughout both kidneys. Multiple heterogeneous masses are seen arising from both kidneys, the largest of which measures 3.9 cm at the upper pole of the left kidney. Malignancy cannot be excluded. There is no evidence of hydronephrosis. No renal or ureteral stones are identified. Scattered parenchymal calcifications are noted bilaterally. Stomach/Bowel: The stomach is unremarkable in appearance. The small bowel is within normal limits. The appendix is normal in caliber, without evidence of appendicitis. The colon is unremarkable in appearance. Vascular/Lymphatic: Diffuse calcification is seen along the abdominal aorta and its branches. The abdominal aorta is otherwise grossly unremarkable. The inferior vena cava is grossly unremarkable. No retroperitoneal lymphadenopathy is seen. No pelvic sidewall lymphadenopathy is identified. Reproductive: The bladder is mildly distended and grossly unremarkable. The prostate remains normal in size. Other: Moderate  volume ascites is seen within the abdomen and pelvis. Musculoskeletal: No acute osseous abnormalities are identified. The visualized musculature is unremarkable in appearance. IMPRESSION: 1. No definite acute abnormality seen to explain the patient's symptoms. 2. Moderate right and small left pleural effusions, with mild atelectasis at the lung bases. 3. Multiple heterogeneous masses arising from both kidneys, the largest of which measures 3.9 cm at the upper pole of the left kidney. Malignancy cannot be excluded. These could be further assessed on MRI of the abdomen with and without contrast, as deemed clinically appropriate. 4. Moderate volume ascites within the abdomen and pelvis. 5. Scattered nonspecific hypodensities within the liver. 6. Mild cardiomegaly. Aortic Atherosclerosis (ICD10-I70.0). Electronically Signed   By: Garald Balding M.D.   On: 01/22/2018 03:57   Dg Chest 2 View  Result Date: 01/21/2018 CLINICAL DATA:  Abdominal and lower extremity edema. Abnormal labs. Shortness of breath. EXAM: CHEST - 2 VIEW COMPARISON:  Chest x-rays dated 11/29/2017 and 10/03/2017 FINDINGS: Stable cardiomegaly. LEFT chest wall ICD apparatus appears stable in position with intact lead. Subtle new opacity within the RIGHT lower lobe, suspicious for pneumonia, alternatively atelectasis or asymmetric edema. LEFT lung is clear. No acute or suspicious osseous finding. IMPRESSION: New RIGHT lower lobe opacity, suspicious for pneumonia, alternatively atelectasis or asymmetric edema. Recommend follow-up chest x-ray to ensure resolution. Stable cardiomegaly. Electronically Signed   By: Franki Cabot M.D.   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

## 2018-01-22 NOTE — Progress Notes (Signed)
62 year old male admitted early this morning with shortness of breath and acute on chronic CHF exacerbation with ejection fraction 25% currently being treated with Lasix 160 mg twice a day.  Cardiology nephrology has been consulted.  Labs were significant for hyperkalemia with stage IV to stage V CKD.  Follow BMP.  He was treated with D50 insulin.  Potassium has came down from 5.8-5.2.  His BNP upon admission was over 4500.  Procalcitonin level was 2.12, chest x-ray new right lower lobe opacity suspicious for pneumonia versus atelectasis versus asymmetric edema.  CT of the abdomen and pelvis shows1. No definite acute abnormality seen to explain the patient's symptoms. 2. Moderate right and small left pleural effusions, with mild atelectasis at the lung bases. 3. Multiple heterogeneous masses arising from both kidneys, the largest of which measures 3.9 cm at the upper pole of the left kidney. Malignancy cannot be excluded. These could be further assessed on MRI of the abdomen with and without contrast, as deemed clinically appropriate. 4. Moderate volume ascites within the abdomen and pelvis. 5. Scattered nonspecific hypodensities within the liver. 6. Mild cardiomegaly  We will hold off on starting antibiotics at this time.

## 2018-01-22 NOTE — Plan of Care (Signed)
  Problem: Clinical Measurements: Goal: Respiratory complications will improve Outcome: Progressing   Problem: Clinical Measurements: Goal: Cardiovascular complication will be avoided Outcome: Progressing   

## 2018-01-22 NOTE — Progress Notes (Signed)
Inpatient Diabetes Program Recommendations  AACE/ADA: New Consensus Statement on Inpatient Glycemic Control (2015)  Target Ranges:  Prepandial:   less than 140 mg/dL      Peak postprandial:   less than 180 mg/dL (1-2 hours)      Critically ill patients:  140 - 180 mg/dL   Results for Kelly Leonard, Kelly Leonard (MRN [id number]) as of 01/22/2018 09:20  Ref. Range 01/22/2018 05:02  Glucose Latest Ref Range: 65 - 99 mg/dL 55 (L)   Review of Glycemic Control  Diabetes history: None  Inpatient Diabetes Program Recommendations:    Lab glucose at 0502 indicated a level of 55 mg/dl. Please consider Checking another CBG. SW  Thanks,  Christena Deem RN, MSN, BC-ADM, [facility] Inpatient Diabetes Coordinator Team Pager [phone] (8a-5p)

## 2018-01-22 NOTE — ED Notes (Signed)
Pt. ambulate to bathroom with assist 

## 2018-01-22 NOTE — ED Notes (Signed)
Pt placed on enteric precautions   

## 2018-01-22 NOTE — ED Notes (Signed)
Pt ambulated to the RR and back to the room

## 2018-01-22 NOTE — Consult Note (Addendum)
Advanced Heart Failure Team Consult Note   Primary Physician: Corky Downs, MD PCP-Cardiologist:  No primary care provider on file.  Reason for Consultation: Heart Failure   HPI:    Kelly Leonard is seen today for evaluation of heart failure  at the request of Dr Toniann Fail.  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   Admitted in October 2018 for A/C systolic heart failure with low output heart failure. Hospital course complicated by hepatitis c , UTI, and severe anxiety. He was on dobutamine but later weaned off.   Seen in ED 12/31/17 for agitation and possible allergic reaction to prednisone/?uloric.  He has been followed in the HF clinic and was last seen yesterday. He had been having episodes of N/V and diarrhea. He continued to take bumex despite N/V.  Earlier this week he took metolazone. Lab work was obtained and showed creatinine >5 so he was instructed to go to [facility] for evaluation.   Admitted for [facility] with AKI. Creatinine on admit was 5.88 and potassium 5.8. BNP was >4500. In ED he received D50 and insulin. On call Nephrologist recommended 160 mg IV lasix.    Feels uncomfortable. Says he is swollen. No orthopnea or PND.   Cardiology consulted. Dr Mayford Knife evaluated this morning with recommendations to continue bidil. Diuresis per nephrology.   Todays creatinine is up slightly from 5.88>6.   RHC 06/2017 RA =12 PA =48/22 (32) PCW =14 Thermocardiac output/index = 4.3/2.4 Fick CO/CI = 2.7/1.6 Ao sat =96% PA sat =41%    Echo 5/16/019  Left ventricle: Systolic function was severely reduced. The   estimated ejection fraction was in the range of 20% to 25%.   Diffuse hypokinesis. - Mitral valve: There was moderate to severe regurgitation. - Left atrium: The atrium was moderately dilated. - Right atrium: The atrium  was mildly dilated. - Atrial septum: No defect or patent foramen ovale was identified. - Pulmonary arteries: PA peak pressure: 50 mm Hg (S).  Review of Systems: [y] = yes, [ ]  = no   General: Weight gain [Y ]; Weight loss [ ] ; Anorexia [ ] ; Fatigue [ ] ; Fever [ ] ; Chills [ ] ; Weakness [Y ]  Cardiac: Chest pain/pressure [ ] ; Resting SOB [ ] ; Exertional SOB [ Y]; Orthopnea [Y ]; Pedal Edema [Y ]; Palpitations [ ] ; Syncope [ ] ; Presyncope [ ] ; Paroxysmal nocturnal dyspnea[ ]   Pulmonary: Cough [ ] ; Wheezing[ ] ; Hemoptysis[ ] ; Sputum [ ] ; Snoring [ ]   GI: Vomiting[Y ]; Dysphagia[ ] ; Melena[ ] ; Hematochezia [ ] ; Heartburn[ ] ; Abdominal pain [ ] ; Constipation [ ] ; Diarrhea [Y ]; BRBPR [ ]   GU: Hematuria[ ] ; Dysuria [ ] ; Nocturia[ ]   Vascular: Pain in legs with walking [ ] ; Pain in feet with lying flat [ ] ; Non-healing sores [ ] ; Stroke [ ] ; TIA [ ] ; Slurred speech [ ] ;  Neuro: Headaches[ ] ; Vertigo[ ] ; Seizures[ ] ; Paresthesias[ ] ;Blurred vision [ ] ; Diplopia [ ] ; Vision changes [ ]   Ortho/Skin: Arthritis [ Y]; Joint pain Y[ ] ; Muscle pain [ ] ; Joint swelling [ ] ; Back Pain [ ] ; Rash [ ]   Psych: Depression[Y ]; Anxiety[Y ]  Heme: Bleeding problems [ ] ; Clotting disorders [ ] ; Anemia [ ]   Endocrine: Diabetes [ ] ; Thyroid dysfunction[ ]   Home Medications Prior to Admission medications   Medication Sig  Start Date End Date Taking? Authorizing Provider  B Complex Vitamins (B COMPLEX PO) Take 1 tablet by mouth daily.   Yes [provider]  benztropine (COGENTIN) 1 MG tablet Take 1 tablet (1 mg total) by mouth daily for 3 days. Patient taking differently: Take 1 mg by mouth every 3 (three) days. AS NEEDED 12/31/17 01/21/18 Yes Sharman Cheek, MD  bumetanide (BUMEX) 1 MG tablet Take 3 tablets (3 mg total) daily by mouth. May take additional 3 mg in the PM as needed for swelling/SOB 07/22/17  Yes Tillery, Mariam Dollar, PA-C  chlordiazePOXIDE (LIBRIUM) 10 MG capsule Take 1 capsule (10 mg total)  by mouth 4 (four) times daily as needed (agitation). Patient taking differently: Take 10 mg by mouth 2 (two) times daily.  12/31/17  Yes Sharman Cheek, MD  Cholecalciferol (VITAMIN D3) 1000 units CAPS Take 1,000 Units by mouth daily.   Yes [provider]  colchicine 0.6 MG tablet Take 0.6 mg by mouth 2 (two) times daily as needed. Gout flare up 01/09/18  Yes [provider]  isosorbide-hydrALAZINE (BIDIL) 20-37.5 MG tablet Take 2 tablets 3 (three) times daily by mouth.   Yes [provider]  metolazone (ZAROXOLYN) 2.5 MG tablet Take 1 tablet (2.5 mg total) by mouth as directed. For weight of 140 lbs or greater Patient taking differently: Take 2.5 mg by mouth as needed (fluid). For weight of 140 lbs or greater 11/19/17 02/17/18 Yes Bensimhon, Bevelyn Buckles, MD  potassium chloride 20 MEQ TBCR Take 20 mEq by mouth as needed. Take 20 mEq only with metolazone. 10/06/17 01/21/18 Yes Bensimhon, Bevelyn Buckles, MD  promethazine (PHENERGAN) 25 MG tablet Take 25 mg by mouth every 6 (six) hours as needed for nausea or vomiting.   Yes [provider]  ALPRAZolam Prudy Feeler) 1 MG tablet Take 1 tablet (1 mg total) by mouth 3 (three) times daily as needed for up to 3 doses for sleep. Patient not taking: Reported on 01/21/2018 11/26/17   Darci Current, MD  busPIRone (BUSPAR) 5 MG tablet Take 1 tablet (5 mg total) 3 (three) times daily by mouth. Patient not taking: Reported on 01/21/2018 07/15/17   Graciella Freer, PA-C  Glecaprevir-Pibrentasvir (MAVYRET) 100-40 MG TABS Take 3 tablets by mouth daily with breakfast. Patient not taking: Reported on 01/21/2018 08/27/17   Kuppelweiser, Cassie L, RPH-CPP  metolazone (ZAROXOLYN) 2.5 MG tablet TAKE 1 TABLET (2.5 MG TOTAL) BY MOUTH DAILY AS NEEDED. Patient not taking: Reported on 01/21/2018 01/13/18   Antonieta Iba, MD    Past Medical History: Past Medical History:  Diagnosis Date  . Anxiety   . CHF (congestive heart failure) (HCC)   .  Chronic systolic congestive heart failure (HCC)   . CKD (chronic kidney disease) stage 4, GFR 15-29 ml/min (HCC)   . COPD (chronic obstructive pulmonary disease) (HCC)   . Dilated cardiomyopathy (HCC)   . Hyperlipidemia   . Hypertension   . MI (myocardial infarction) (HCC)   . Polycystic kidney   . Renal disorder     Past Surgical History: Past Surgical History:  Procedure Laterality Date  . CARDIAC CATHETERIZATION    . IMPLANTABLE CARDIOVERTER DEFIBRILLATOR (ICD) GENERATOR CHANGE Right 06/23/2015   Procedure: ICD GENERATOR  INITIAL IMPLANT;  Surgeon: Sharion Settler, MD;  Location: [facility] ORS;  Service: Cardiovascular;  Laterality: Right;    Family History: Family History  Problem Relation Age of Onset  . Diabetes Mother   . Hypertension Brother   . Heart disease  Maternal Grandmother   . Heart attack Maternal Grandmother     Social History: Social History   Socioeconomic History  . Marital status: Married    Spouse name: Not on file  . Number of children: Not on file  . Years of education: Not on file  . Highest education level: Not on file  Occupational History  . Not on file  Social Needs  . Financial resource strain: Not on file  . Food insecurity:    Worry: Not on file    Inability: Not on file  . Transportation needs:    Medical: Not on file    Non-medical: Not on file  Tobacco Use  . Smoking status: Never Smoker  . Smokeless tobacco: Never Used  Substance and Sexual Activity  . Alcohol use: No  . Drug use: No  . Sexual activity: Yes  Lifestyle  . Physical activity:    Days per week: Not on file    Minutes per session: Not on file  . Stress: Not on file  Relationships  . Social connections:    Talks on phone: Not on file    Gets together: Not on file    Attends religious service: Not on file    Active member of club or organization: Not on file    Attends meetings of clubs or organizations: Not on file    Relationship status: Not on file  Other  Topics Concern  . Not on file  Social History Narrative  . Not on file    Allergies:  Allergies  Allergen Reactions  . Entresto [Sacubitril-Valsartan] Swelling    Swelling of the face  . Penicillins Anaphylaxis and Swelling    Has patient had a PCN reaction causing immediate rash, facial/tongue/throat swelling, SOB or lightheadedness with hypotension: Yes Has patient had a PCN reaction causing severe rash involving mucus membranes or skin necrosis: No Has patient had a PCN reaction that required hospitalization: No Has patient had a PCN reaction occurring within the last 10 years: No If all of the above answers are "NO", then may proceed with Cephalosporin use.   . Seroquel [Quetiapine] Nausea And Vomiting and Hives    Hives   . Ace Inhibitors Rash  . Corlanor [Ivabradine] Swelling    Facial swelling  . Ambien [Zolpidem Tartrate] Swelling  . Carvedilol Other (See Comments)    "dizziness, light headed, and nausea"  . Hydroxyzine Nausea Only  . Prednisone Other (See Comments)    Causes fluid build up  . Sertraline Diarrhea    itching  . Trazodone And Nefazodone Swelling and Other (See Comments)    "up for days" and lip swelling  . Uloric [Febuxostat] Other (See Comments)    Jittery, jerky, makes him lose his mind  . Benadryl [Diphenhydramine Hcl] Other (See Comments)    "makes me crazy and hyper"  . Gabapentin Swelling    Lip and face swelling  . Lorazepam Rash    Rash and severe anxiety  . Torsemide Hives, Itching, Nausea And Vomiting and Rash    Objective:    Vital Signs:   Temp:  [97.4 F (36.3 C)-98.1 F (36.7 C)] 97.4 F (36.3 C) (05/16 1445) Pulse Rate:  [81-94] 87 (05/16 1445) Resp:  [8-29] 14 (05/16 1445) BP: (114-147)/(83-125) 138/100 (05/16 1445) SpO2:  [35 %-100 %] 35 % (05/16 1445) Weight:  [141 lb 4.8 oz (64.1 kg)] 141 lb 4.8 oz (64.1 kg) (05/16 1450) Last BM Date: 01/22/18  Weight change: [employer]  01/22/18 1450  Weight: 141 lb 4.8 oz  (64.1 kg)    Intake/Output:   Intake/Output Summary (Last 24 hours) at 01/22/2018 1539 Last data filed at 01/22/2018 1500 Gross per 24 hour  Intake 132 ml  Output 1200 ml  Net -1068 ml      Physical Exam    General:  Appears chronically ill. Cachetic  No resp difficulty HEENT: normal anicteric   Neck: supple. JVP to jaw . Carotids 2+ bilat; no bruits. No lymphadenopathy or thyromegaly appreciated. Cor: PMI laterally displaced. Regular rate & rhythm. + s3 Lungs: clear Abdomen: soft, tender, distended. No hepatosplenomegaly. No bruits or masses. Good bowel sounds. Extremities: no cyanosis, clubbing, rash, R and LLE 2+ edema Neuro: alert & orientedx3, cranial nerves grossly intact. moves all 4 extremities w/o difficulty. Affect flat  Telemetry   SR 70-80s   EKG    SR 83 bpm IVCD lateral TEI Discussed dosing with PharmD personally.   Labs   Basic Metabolic Panel: Recent Labs  Lab 01/21/18 1003 01/21/18 1940 01/22/18 0502  NA 132* 131* 133*  K 5.7* 5.8* 5.2*  CL 90* 89* 92*  CO2 24 21* 20*  GLUCOSE 142* 93 55*  BUN 160* 162* 163*  CREATININE 5.65* 5.88* 6.00*  CALCIUM 9.4 9.4 9.4    Liver Function Tests: Recent Labs  Lab 01/21/18 1940 01/22/18 0502  AST 73* 66*  ALT 48 47  ALKPHOS 81 72  BILITOT 2.4* 1.8*  PROT 6.8 6.3*  ALBUMIN 3.2* 3.1*   Recent Labs  Lab 01/22/18 0502  LIPASE 47   No results for input(s): AMMONIA in the last 168 hours.  CBC: Recent Labs  Lab 01/21/18 1941 01/22/18 0502  WBC 8.6 7.4  NEUTROABS 7.1 6.2  HGB 10.9* 10.9*  HCT 36.2* 34.4*  MCV 68.2* 65.9*  PLT 485* 368    Cardiac Enzymes: Recent Labs  Lab 01/22/18 0502 01/22/18 1047  TROPONINI 0.09* 0.10*    BNP: BNP (last 3 results) Recent Labs    10/03/17 0111 11/26/17 0610 01/21/18 1940  BNP 1,644.0* 2,282.0* >4,500.0*    ProBNP (last 3 results) No results for input(s): PROBNP in the last 8760 hours.   CBG: Recent Labs  Lab 01/22/18 0053  GLUCAP  98    Coagulation Studies: No results for input(s): LABPROT, INR in the last 72 hours.   Imaging   Ct Abdomen Pelvis Wo Contrast  Result Date: 01/22/2018 CLINICAL DATA:  Acute onset of nausea and vomiting. EXAM: CT ABDOMEN AND PELVIS WITHOUT CONTRAST TECHNIQUE: Multidetector CT imaging of the abdomen and pelvis was performed following the standard protocol without IV contrast. COMPARISON:  Renal ultrasound performed 07/08/2017 FINDINGS: Lower chest: Moderate right and small left pleural effusions are noted, with mild atelectasis at the lung bases. The heart is mildly enlarged. An AICD lead is noted. Hepatobiliary: Scattered nonspecific hypodensities are seen within the liver. The liver is otherwise grossly unremarkable. The gallbladder is not well assessed given surrounding ascites. The common bile duct remains normal in caliber. Pancreas: The pancreas is within normal limits. Spleen: The spleen is unremarkable in appearance. Adrenals/Urinary Tract: The adrenal glands are grossly unremarkable in appearance. Innumerable cysts are seen throughout both kidneys. Multiple heterogeneous masses are seen arising from both kidneys, the largest of which measures 3.9 cm at the upper pole of the left kidney. Malignancy cannot be excluded. There is no evidence of hydronephrosis. No renal or ureteral stones are identified. Scattered parenchymal calcifications are noted bilaterally. Stomach/Bowel: The stomach is unremarkable  in appearance. The small bowel is within normal limits. The appendix is normal in caliber, without evidence of appendicitis. The colon is unremarkable in appearance. Vascular/Lymphatic: Diffuse calcification is seen along the abdominal aorta and its branches. The abdominal aorta is otherwise grossly unremarkable. The inferior vena cava is grossly unremarkable. No retroperitoneal lymphadenopathy is seen. No pelvic sidewall lymphadenopathy is identified. Reproductive: The bladder is mildly distended  and grossly unremarkable. The prostate remains normal in size. Other: Moderate volume ascites is seen within the abdomen and pelvis. Musculoskeletal: No acute osseous abnormalities are identified. The visualized musculature is unremarkable in appearance. IMPRESSION: 1. No definite acute abnormality seen to explain the patient's symptoms. 2. Moderate right and small left pleural effusions, with mild atelectasis at the lung bases. 3. Multiple heterogeneous masses arising from both kidneys, the largest of which measures 3.9 cm at the upper pole of the left kidney. Malignancy cannot be excluded. These could be further assessed on MRI of the abdomen with and without contrast, as deemed clinically appropriate. 4. Moderate volume ascites within the abdomen and pelvis. 5. Scattered nonspecific hypodensities within the liver. 6. Mild cardiomegaly. Aortic Atherosclerosis (ICD10-I70.0). Electronically Signed   By: Roanna Raider M.D.   On: 01/22/2018 03:57   Dg Chest 2 View  Result Date: 01/21/2018 CLINICAL DATA:  Abdominal and lower extremity edema. Abnormal labs. Shortness of breath. EXAM: CHEST - 2 VIEW COMPARISON:  Chest x-rays dated 11/29/2017 and 10/03/2017 FINDINGS: Stable cardiomegaly. LEFT chest wall ICD apparatus appears stable in position with intact lead. Subtle new opacity within the RIGHT lower lobe, suspicious for pneumonia, alternatively atelectasis or asymmetric edema. LEFT lung is clear. No acute or suspicious osseous finding. IMPRESSION: New RIGHT lower lobe opacity, suspicious for pneumonia, alternatively atelectasis or asymmetric edema. Recommend follow-up chest x-ray to ensure resolution. Stable cardiomegaly. Electronically Signed   By: Bary Richard M.D.   On: 01/21/2018 21:15      Medications:     Current Medications: . cholestyramine light  4 g Oral TID  . heparin  5,000 Units Subcutaneous Q8H  . isosorbide-hydrALAZINE  2 tablet Oral TID  . saccharomyces boulardii  250 mg Oral BID      Infusions: . furosemide Stopped (01/22/18 1135)       Patient Profile    Kelly Leonard is a 62 year old with a history of   h/o NICM, CKD stage IV due to severe polycystic KD (baseline creatinine 2.8-3.0), HTN, anxiety, ICD, and chronic systolic heart failure.   Admitted with AKI on CKD and A/C systolic heart failure.   Assessment/Plan   1. AKI on CKD Stage IV Polycystic Kidney Disease Creatinine on admit 5.88>6 BUN 160  -Follow BMET  -Nephrology was consulted.   2. A/C Systolic HF, NICM. ECHO 06/2017 EF 25% -Volume overloaded. Received IV lasix per nephrology. Sluggish urine output.  No bb with recent low output.  Allergic to entresto, carvedilol, torsemide, and ace Continue bidil.    3. Hyperkalemia K on admit 5.8 --> after D50 and insulin. K down to 5.2   4. HCV He has been followed by ID and has been on mavyret per ID  5. Gout  Allergic to allopurinol, prednisone, and uloric.   Medication concerns reviewed with patient and pharmacy team. Barriers identified: none  Length of Stay: 0  Tonye Becket, NP  01/22/2018, 3:39 PM  Advanced Heart Failure Team Pager [phone] (M-F; 7a - 4p)  Please contact [facility] for night-coverage after hours (4p -7a ) and weekends on  [email or web address]  Patient seen and examined with Tonye Becket, NP. We discussed all aspects of the encounter. I agree with the assessment and plan as stated above.   62 y/o male with advanced systolic heart failure EF 35% previously on dobutamine and CKD stage IV with baseline creatinine around 3.5. Now admitted with volume overload and AKI. BUN 160. Appears uremic on exam. BP ok but suspect he has component of cardiorenal syndrome as well. Has not been candidate for advanced therapies due to CKD and noncompliance.   On exam ill appearing JVP to jaw  Cor RRR +s3 Ab distended Ext warm 2+ edema  He is uremic and volume overloaded on exam. Creatinine 6.00. K 5.2. Suspect he will need HD very soon. D/w Dr.  Lowell Guitar. I worry that with his EF he won't tolerate HD well long-term but other options are limited. If BP drops low threshold to add dobutamine.   GOC discussions also reasonable.   Arvilla Meres, MD  4:22 PM

## 2018-01-22 NOTE — Consult Note (Signed)
Admit date: 01/21/2018 Referring Physician  Dr. Wilkie Aye Primary Physician  Dr. Corky Downs Primary Cardiologist  Dr. Gala Romney Reason for Consultation  Acute on chronic renal failure in setting of volume overload  HPI: Kelly Leonard is a 62 y.o. male who is being seen today for the evaluation of worsening renal function in the setting of volume overload at the request of [facility] Dr. Toniann Fail.  Kelly Leonard is a 62 y.o. 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 Initially was diagnosed with low output heart failure and acute kidney injury in October 2018 and had a right heart catheterization done which showed a low cardiac output of 2.7 with cardiac index 1.6 by sick and cardiac up at 4.3 by thermodilution with a pulmonary capillary wedge of 14.  He was started on dobutamine and heart failure meds were optimized.  His creatinine improved to 2.5 after peaking at 4.14.  He was seen in the heart failure clinic yesterday morning for follow-up.  Earlier that week he had apparently had an episode of chest pain and EMS was called but patient thought it was severe GERD and refused to go to the emergency room.  He says that he has been feeling poorly over the past week.  He tells me that he has had nausea, vomiting and diarrhea as well as chills and possible fever for the past week and has had poor p.o. intake for several days.  He is also had a cough.  His gout has also been flaring with severe lower extremity pain to the point he can barely walk.  He says despite the nausea and vomiting over the past several days he is continue to take his diuretics.  He has noted increased shortness of breath but weight has been stable at home around 138 141 pounds.  He is also noted decreased urine output this week and actually took an extra metolazone yesterday today.  In the heart failure office yesterday his  weight was down 5 pounds from his prior visit with his target weight being 135 to 139 pounds.  Exam at that time he did have evidence of JVP and lower extremity edema.  Plan was to check a bmet and if creatinine was worsening then he would need to be sent to the emergency room.  In the meantime he was told to hold his Bumex and resume the next day.  Patient was called later that day to state that he was in acute renal failure they creatinine of 5.65 and a potassium of 5.7 and was instructed to go the emergency room.  In the ER labs were repeated showing a potassium of 5.8 and a creatinine of 5.88.  BNP was greater than 4500.  Nephrology was called and recommended giving high-dose IV Lasix and if patient did not respond with adequate urine output then would need emergent dialysis.  He was treated for his hyperkalemia.  Cardiology was notified of patient's admission.       PMH:   Past Medical History:  Diagnosis Date  . Anxiety   . CHF (congestive heart failure) (HCC)   . Chronic systolic congestive heart failure (HCC)   . CKD (chronic kidney disease) stage 4, GFR 15-29 ml/min (HCC)   . COPD (chronic obstructive pulmonary disease) (HCC)   . Dilated cardiomyopathy (HCC)   . Hyperlipidemia   . Hypertension   . MI (myocardial infarction) (HCC)   .  Polycystic kidney   . Renal disorder      PSH:   Past Surgical History:  Procedure Laterality Date  . CARDIAC CATHETERIZATION    . IMPLANTABLE CARDIOVERTER DEFIBRILLATOR (ICD) GENERATOR CHANGE Right 06/23/2015   Procedure: ICD GENERATOR  INITIAL IMPLANT;  Surgeon: Sharion Settler, MD;  Location: [facility] ORS;  Service: Cardiovascular;  Laterality: Right;    Allergies:  Entresto [sacubitril-valsartan]; Penicillins; Seroquel [quetiapine]; Ace inhibitors; Corlanor [ivabradine]; Ambien [zolpidem tartrate]; Carvedilol; Hydroxyzine; Prednisone; Sertraline; Trazodone and nefazodone; Uloric [febuxostat]; Benadryl [diphenhydramine hcl]; Gabapentin; Lorazepam; and  Torsemide Prior to Admit Meds:   (Not in a hospital admission) Fam HX:    Family History  Problem Relation Age of Onset  . Diabetes Mother   . Hypertension Brother   . Heart disease Maternal Grandmother   . Heart attack Maternal Grandmother    Social HX:    Social History   Socioeconomic History  . Marital status: Married    Spouse name: Not on file  . Number of children: Not on file  . Years of education: Not on file  . Highest education level: Not on file  Occupational History  . Not on file  Social Needs  . Financial resource strain: Not on file  . Food insecurity:    Worry: Not on file    Inability: Not on file  . Transportation needs:    Medical: Not on file    Non-medical: Not on file  Tobacco Use  . Smoking status: Never Smoker  . Smokeless tobacco: Never Used  Substance and Sexual Activity  . Alcohol use: No  . Drug use: No  . Sexual activity: Yes  Lifestyle  . Physical activity:    Days per week: Not on file    Minutes per session: Not on file  . Stress: Not on file  Relationships  . Social connections:    Talks on phone: Not on file    Gets together: Not on file    Attends religious service: Not on file    Active member of club or organization: Not on file    Attends meetings of clubs or organizations: Not on file    Relationship status: Not on file  . Intimate partner violence:    Fear of current or ex partner: Not on file    Emotionally abused: Not on file    Physically abused: Not on file    Forced sexual activity: Not on file  Other Topics Concern  . Not on file  Social History Narrative  . Not on file     ROS:  All  ROS were addressed and are negative except what is stated in the HPI  Physical Exam: Blood pressure (!) 123/101, pulse 84, temperature 98.1 F (36.7 C), resp. rate (!) 21, SpO2 97 %.    General: Well developed, well nourished, in no acute distress Head: Eyes PERRLA, No xanthomas.   Normal cephalic and atramatic  Lungs:    Scattered rhonchi. Heart:   HRRR S1 S2 Pulses are 2+ & equal.            No carotid bruit.  JVD noted to the jaw . No abdominal bruits. No femoral bruits. Abdomen: Bowel sounds are positive, abdomen is firm and mildly tender to palpation  Msk:  Back normal, normal gait. Normal strength and tone for age. Extremities:   No clubbing, cyanosis.  2+ lower extremity edema R>L.  DP +1 Neuro: Alert and oriented X 3. Psych:  Good affect, responds  appropriately    Labs:   Lab Results  Component Value Date   WBC 7.4 01/22/2018   HGB 10.9 (L) 01/22/2018   HCT 34.4 (L) 01/22/2018   MCV 65.9 (L) 01/22/2018   PLT 368 01/22/2018   Recent Labs  Lab 01/22/18 0502  NA 133*  K 5.2*  CL 92*  CO2 20*  BUN 163*  CREATININE 6.00*  CALCIUM 9.4  PROT 6.3*  BILITOT 1.8*  ALKPHOS 72  ALT 47  AST 66*  GLUCOSE 55*   No results found for: PTT Lab Results  Component Value Date   INR 1.20 07/07/2017   INR 1.15 05/14/2017   INR 0.94 06/12/2015   Lab Results  Component Value Date   CKTOTAL 103 11/12/2012   CKMB 1.9 11/12/2012   TROPONINI 0.09 (HH) 01/22/2018     Lab Results  Component Value Date   CHOL 201 (H) 02/12/2017   Lab Results  Component Value Date   HDL 63 02/12/2017   Lab Results  Component Value Date   LDLCALC 120 (H) 02/12/2017   Lab Results  Component Value Date   TRIG 88 02/12/2017   No results found for: CHOLHDL No results found for: LDLDIRECT    Radiology:  Ct Abdomen Pelvis Wo Contrast  Result Date: 01/22/2018 CLINICAL DATA:  Acute onset of nausea and vomiting. EXAM: CT ABDOMEN AND PELVIS WITHOUT CONTRAST TECHNIQUE: Multidetector CT imaging of the abdomen and pelvis was performed following the standard protocol without IV contrast. COMPARISON:  Renal ultrasound performed 07/08/2017 FINDINGS: Lower chest: Moderate right and small left pleural effusions are noted, with mild atelectasis at the lung bases. The heart is mildly enlarged. An AICD lead is noted.  Hepatobiliary: Scattered nonspecific hypodensities are seen within the liver. The liver is otherwise grossly unremarkable. The gallbladder is not well assessed given surrounding ascites. The common bile duct remains normal in caliber. Pancreas: The pancreas is within normal limits. Spleen: The spleen is unremarkable in appearance. Adrenals/Urinary Tract: The adrenal glands are grossly unremarkable in appearance. Innumerable cysts are seen throughout both kidneys. Multiple heterogeneous masses are seen arising from both kidneys, the largest of which measures 3.9 cm at the upper pole of the left kidney. Malignancy cannot be excluded. There is no evidence of hydronephrosis. No renal or ureteral stones are identified. Scattered parenchymal calcifications are noted bilaterally. Stomach/Bowel: The stomach is unremarkable in appearance. The small bowel is within normal limits. The appendix is normal in caliber, without evidence of appendicitis. The colon is unremarkable in appearance. Vascular/Lymphatic: Diffuse calcification is seen along the abdominal aorta and its branches. The abdominal aorta is otherwise grossly unremarkable. The inferior vena cava is grossly unremarkable. No retroperitoneal lymphadenopathy is seen. No pelvic sidewall lymphadenopathy is identified. Reproductive: The bladder is mildly distended and grossly unremarkable. The prostate remains normal in size. Other: Moderate volume ascites is seen within the abdomen and pelvis. Musculoskeletal: No acute osseous abnormalities are identified. The visualized musculature is unremarkable in appearance. IMPRESSION: 1. No definite acute abnormality seen to explain the patient's symptoms. 2. Moderate right and small left pleural effusions, with mild atelectasis at the lung bases. 3. Multiple heterogeneous masses arising from both kidneys, the largest of which measures 3.9 cm at the upper pole of the left kidney. Malignancy cannot be excluded. These could be  further assessed on MRI of the abdomen with and without contrast, as deemed clinically appropriate. 4. Moderate volume ascites within the abdomen and pelvis. 5. Scattered nonspecific hypodensities within the liver. 6. Mild  cardiomegaly. Aortic Atherosclerosis (ICD10-I70.0). Electronically Signed   By: Roanna Raider M.D.   On: 01/22/2018 03:57   Dg Chest 2 View  Result Date: 01/21/2018 CLINICAL DATA:  Abdominal and lower extremity edema. Abnormal labs. Shortness of breath. EXAM: CHEST - 2 VIEW COMPARISON:  Chest x-rays dated 11/29/2017 and 10/03/2017 FINDINGS: Stable cardiomegaly. LEFT chest wall ICD apparatus appears stable in position with intact lead. Subtle new opacity within the RIGHT lower lobe, suspicious for pneumonia, alternatively atelectasis or asymmetric edema. LEFT lung is clear. No acute or suspicious osseous finding. IMPRESSION: New RIGHT lower lobe opacity, suspicious for pneumonia, alternatively atelectasis or asymmetric edema. Recommend follow-up chest x-ray to ensure resolution. Stable cardiomegaly. Electronically Signed   By: Bary Richard M.D.   On: 01/21/2018 21:15     Telemetry    NSR - Personally Reviewed  ECG    Sinus rhythm with first-degree AV block and anterior infarct no acute ST changes- Personally Reviewed   ASSESSMENT/PLAN:   1.  Acute on chronic systolic heart failure -NICM. ECHO EF 06/2017 EF 20-25%. S/p [employer] ICD -NYHA III-IIIb, confounded by acute illness. -He continues to appear volume overloaded on exam with evidence of JVD as well as firm abdomen on exam.  He still has at least 2-3+ lower extremity edema as well. -Baseline weight is 135 to 139 pounds and weight at office visit 5/15 was 141 pounds. -Creatinine now bumped to 1.88 and likely related to a combination of low output heart failure in the setting of decreased p.o. intake while continuing diuretic therapy -Nephrology has been called and has ordered high dose IV Lasix.  If patient does not  respond with adequate urine output and patient will likely undergo hemodialysis. -Diuretic therapy per nephrology -No Spiro/ACE/ARB/ARNI with CKD IV and now stage 5. -He is apparently allergic to Entresto, carvedilol, torsemide and ACE inhibitors -Blocker has not been added due to low cardiac output syndrome -He has been maintained on BiDil 2 tabs 3 times daily. -Review of last office note Dr. Augustina Mood has apparently discussed possible heart kidney transplant evaluation at [facility]  2.  Acute on chronic kidney disease now stage V in the setting of polycystic kidney disease -This is likely been exacerbated by decreased p.o. intake with possible GI illness over the past few days. -Plan creatinine has been 3-3.5 in the setting of PCKD and heart failure -Nephrology managing  3.  Gout -He has had a flare of his gout over the past week with severe lower extremity pain to the point he says he can barely walk -He is allergic to Uloric and allopurinol has caused a rash in the past -Is followed by rheumatology  4.  Recent fever, chills and cough with chest x-ray showing new right lower lobe infiltrate -Calcitonin and W -Per [facility] BC are normal  5.  Elevated troponin -This is only minimally elevated at 0.07 and 0.09 and likely reflects his underlying worsening renal function in the setting of heart failure -Continue to follow  6.  Hypertension -BP has been anywhere from 120/89-130 4/110 mmHg. -We will continue BiDil 2 tabs twice daily for now -Continue to monitor blood pressure for now    Armanda Magic, MD  01/22/2018  6:05am

## 2018-01-22 NOTE — ED Notes (Signed)
Condom cath placed per RN order

## 2018-01-22 NOTE — ED Notes (Signed)
Alert and oriented x4. Skin warm and dry. Respirations equal and unlabored. Pt denies pain. Sacral area redden and inflamed, pt express some discomfort from hemorrhoids. Scrotum and penis swollen and painful to touch.

## 2018-01-22 NOTE — H&P (Signed)
History and Physical    Rexall Maybank UYQ:[id number] DOB: 1956-08-22 DOA: 01/21/2018  PCP: Corky Downs, MD  Patient coming from: Home.  Chief Complaint: Shortness of breath.  HPI: Kelly Leonard is a 62 y.o. male with history of chronic systolic heart failure status post ICD, COPD, hypertension, polycystic kidney with stage IV kidney disease, anemia presents to the ER with complaint of worsening shortness of breath over the last 1 week.  Patient also has gained increasing fluid in the abdomen genitals and lower extremity.  Prior to last week patient has benign persistent nausea vomiting for last 2 weeks.  But over the last 2 to 3 days patient has been able to keep in food.  Has chronic abdominal pain from hernia in the epigastric area.  Denies any chest pain has some productive cough.  Denies any fever chills.  Has been taking his Bumex.  2 weeks ago patient had taken Uloric for gout following which patient had some tremors and had to discontinue.  Patient was briefly placed on Cogentin and chlordiazepoxide which patient has already taken 1 or 2 doses.  ED Course: In the ER patient appears volume overloaded with chest x-ray showing congestion versus infiltrates BNP was more than 4500 creatinine has worsened from last month of 3.7-5.8 with potassium of 5.8.  EKG shows normal sinus rhythm with some tall T waves.  Was given D50 and insulin for the hyperkalemia and ER physician had discussed with on-call nephrologist about the worsening renal function and CHF who advised to give Lasix 160 mg which has been ordered.  On exam patient has mild abdominal tenderness.  Review of Systems: As per HPI, rest all negative.   Past Medical History:  Diagnosis Date  . Anxiety   . CHF (congestive heart failure) (HCC)   . Chronic systolic congestive heart failure (HCC)   . CKD (chronic kidney disease) stage 4, GFR 15-29 ml/min (HCC)   . COPD (chronic obstructive pulmonary disease) (HCC)   . Dilated  cardiomyopathy (HCC)   . Hyperlipidemia   . Hypertension   . MI (myocardial infarction) (HCC)   . Polycystic kidney   . Renal disorder     Past Surgical History:  Procedure Laterality Date  . CARDIAC CATHETERIZATION    . IMPLANTABLE CARDIOVERTER DEFIBRILLATOR (ICD) GENERATOR CHANGE Right 06/23/2015   Procedure: ICD GENERATOR  INITIAL IMPLANT;  Surgeon: Sharion Settler, MD;  Location: [facility] ORS;  Service: Cardiovascular;  Laterality: Right;     reports that he has never smoked. He has never used smokeless tobacco. He reports that he does not drink alcohol or use drugs.  Allergies  Allergen Reactions  . Entresto [Sacubitril-Valsartan] Swelling    Swelling of the face  . Penicillins Anaphylaxis and Swelling    Has patient had a PCN reaction causing immediate rash, facial/tongue/throat swelling, SOB or lightheadedness with hypotension: Yes Has patient had a PCN reaction causing severe rash involving mucus membranes or skin necrosis: No Has patient had a PCN reaction that required hospitalization: No Has patient had a PCN reaction occurring within the last 10 years: No If all of the above answers are "NO", then may proceed with Cephalosporin use.   . Seroquel [Quetiapine] Nausea And Vomiting and Hives    Hives   . Ace Inhibitors Rash  . Corlanor [Ivabradine] Swelling    Facial swelling  . Ambien [Zolpidem Tartrate] Swelling  . Carvedilol Other (See Comments)    "dizziness, light headed, and nausea"  . Hydroxyzine Nausea Only  .  Prednisone Other (See Comments)    Causes fluid build up  . Sertraline Diarrhea    itching  . Trazodone And Nefazodone Swelling and Other (See Comments)    "up for days" and lip swelling  . Uloric [Febuxostat] Other (See Comments)    Jittery, jerky, makes him lose his mind  . Benadryl [Diphenhydramine Hcl] Other (See Comments)    "makes me crazy and hyper"  . Gabapentin Swelling    Lip and face swelling  . Lorazepam Rash    Rash and severe anxiety    . Torsemide Hives, Itching, Nausea And Vomiting and Rash    Family History  Problem Relation Age of Onset  . Diabetes Mother   . Hypertension Brother   . Heart disease Maternal Grandmother   . Heart attack Maternal Grandmother     Prior to Admission medications   Medication Sig Start Date End Date Taking? Authorizing Provider  B Complex Vitamins (B COMPLEX PO) Take 1 tablet by mouth daily.   Yes [provider]  benztropine (COGENTIN) 1 MG tablet Take 1 tablet (1 mg total) by mouth daily for 3 days. Patient taking differently: Take 1 mg by mouth every 3 (three) days. AS NEEDED 12/31/17 01/21/18 Yes Sharman Cheek, MD  bumetanide (BUMEX) 1 MG tablet Take 3 tablets (3 mg total) daily by mouth. May take additional 3 mg in the PM as needed for swelling/SOB 07/22/17  Yes Tillery, Mariam Dollar, PA-C  chlordiazePOXIDE (LIBRIUM) 10 MG capsule Take 1 capsule (10 mg total) by mouth 4 (four) times daily as needed (agitation). Patient taking differently: Take 10 mg by mouth 2 (two) times daily.  12/31/17  Yes Sharman Cheek, MD  Cholecalciferol (VITAMIN D3) 1000 units CAPS Take 1,000 Units by mouth daily.   Yes [provider]  colchicine 0.6 MG tablet Take 0.6 mg by mouth 2 (two) times daily as needed. Gout flare up 01/09/18  Yes [provider]  isosorbide-hydrALAZINE (BIDIL) 20-37.5 MG tablet Take 2 tablets 3 (three) times daily by mouth.   Yes [provider]  metolazone (ZAROXOLYN) 2.5 MG tablet Take 1 tablet (2.5 mg total) by mouth as directed. For weight of 140 lbs or greater Patient taking differently: Take 2.5 mg by mouth as needed (fluid). For weight of 140 lbs or greater 11/19/17 02/17/18 Yes Bensimhon, Bevelyn Buckles, MD  potassium chloride 20 MEQ TBCR Take 20 mEq by mouth as needed. Take 20 mEq only with metolazone. 10/06/17 01/21/18 Yes Bensimhon, Bevelyn Buckles, MD  promethazine (PHENERGAN) 25 MG tablet Take 25 mg by mouth every 6 (six) hours as needed for nausea  or vomiting.   Yes [provider]  ALPRAZolam Prudy Feeler) 1 MG tablet Take 1 tablet (1 mg total) by mouth 3 (three) times daily as needed for up to 3 doses for sleep. Patient not taking: Reported on 01/21/2018 11/26/17   Darci Current, MD  busPIRone (BUSPAR) 5 MG tablet Take 1 tablet (5 mg total) 3 (three) times daily by mouth. Patient not taking: Reported on 01/21/2018 07/15/17   Graciella Freer, PA-C  Glecaprevir-Pibrentasvir (MAVYRET) 100-40 MG TABS Take 3 tablets by mouth daily with breakfast. Patient not taking: Reported on 01/21/2018 08/27/17   Kuppelweiser, Cassie L, RPH-CPP  metolazone (ZAROXOLYN) 2.5 MG tablet TAKE 1 TABLET (2.5 MG TOTAL) BY MOUTH DAILY AS NEEDED. Patient not taking: Reported on 01/21/2018 01/13/18   Antonieta Iba, MD    Physical Exam: Vitals:   01/21/18 2322 01/22/18 0030 01/22/18 0130 01/22/18 0145  BP: (!) 136/116 (!) 137/103 (!) 137/125 (!) 126/100  Pulse: 88     Resp: 20 (!) 8 (!) 9 16  Temp:      SpO2: 93%         Constitutional: Moderately built and nourished. Vitals:   01/21/18 2322 01/22/18 0030 01/22/18 0130 01/22/18 0145  BP: (!) 136/116 (!) 137/103 (!) 137/125 (!) 126/100  Pulse: 88     Resp: 20 (!) 8 (!) 9 16  Temp:      SpO2: 93%      Eyes: Anicteric no pallor. ENMT: No discharge from the ears eyes nose or mouth. Neck: JVD elevated no mass felt. Respiratory: No rhonchi or crepitations. Cardiovascular: S1-S2 heard no murmurs appreciated. Abdomen: Mildly distended mild epigastric tenderness.  No guarding or rigidity.  Edematous genitals. Musculoskeletal: Bilateral lower extremity edema. Skin: No rash. Neurologic: Alert awake oriented to time place and person.  Moves all extremities. Psychiatric: Appears normal.  Normal affect.   Labs on Admission: I have personally reviewed following labs and imaging studies  CBC: Recent Labs  Lab 01/21/18 1941  WBC 8.6  NEUTROABS 7.1  HGB 10.9*  HCT 36.2*  MCV 68.2*  PLT 485*     Basic Metabolic Panel: Recent Labs  Lab 01/21/18 1003 01/21/18 1940  NA 132* 131*  K 5.7* 5.8*  CL 90* 89*  CO2 24 21*  GLUCOSE 142* 93  BUN 160* 162*  CREATININE 5.65* 5.88*  CALCIUM 9.4 9.4   GFR: Estimated Creatinine Clearance: 11.9 mL/min (A) (by C-G formula based on SCr of 5.88 mg/dL (H)). Liver Function Tests: Recent Labs  Lab 01/21/18 1940  AST 73*  ALT 48  ALKPHOS 81  BILITOT 2.4*  PROT 6.8  ALBUMIN 3.2*   No results for input(s): LIPASE, AMYLASE in the last 168 hours. No results for input(s): AMMONIA in the last 168 hours. Coagulation Profile: No results for input(s): INR, PROTIME in the last 168 hours. Cardiac Enzymes: No results for input(s): CKTOTAL, CKMB, CKMBINDEX, TROPONINI in the last 168 hours. BNP (last 3 results) No results for input(s): PROBNP in the last 8760 hours. HbA1C: No results for input(s): HGBA1C in the last 72 hours. CBG: Recent Labs  Lab 01/22/18 0053  GLUCAP 98   Lipid Profile: No results for input(s): CHOL, HDL, LDLCALC, TRIG, CHOLHDL, LDLDIRECT in the last 72 hours. Thyroid Function Tests: No results for input(s): TSH, T4TOTAL, FREET4, T3FREE, THYROIDAB in the last 72 hours. Anemia Panel: No results for input(s): VITAMINB12, FOLATE, FERRITIN, TIBC, IRON, RETICCTPCT in the last 72 hours. Urine analysis:    Component Value Date/Time   COLORURINE YELLOW 01/21/2018 1934   APPEARANCEUR HAZY (A) 01/21/2018 1934   LABSPEC 1.011 01/21/2018 1934   PHURINE 6.0 01/21/2018 1934   GLUCOSEU NEGATIVE 01/21/2018 1934   HGBUR NEGATIVE 01/21/2018 1934   BILIRUBINUR NEGATIVE 01/21/2018 1934   KETONESUR NEGATIVE 01/21/2018 1934   PROTEINUR 100 (A) 01/21/2018 1934   NITRITE NEGATIVE 01/21/2018 1934   LEUKOCYTESUR NEGATIVE 01/21/2018 1934   Sepsis Labs: @LABRCNTIP (procalcitonin:4,lacticidven:4) )No results found for this or any previous visit (from the past 240 hour(s)).   Radiological Exams on Admission: Dg Chest 2 View  Result  Date: 01/21/2018 CLINICAL DATA:  Abdominal and lower extremity edema. Abnormal labs. Shortness of breath. EXAM: CHEST - 2 VIEW COMPARISON:  Chest x-rays dated 11/29/2017 and 10/03/2017 FINDINGS: Stable cardiomegaly. LEFT chest wall ICD apparatus appears stable in position with intact lead. Subtle new opacity within the RIGHT lower lobe, suspicious for pneumonia,  alternatively atelectasis or asymmetric edema. LEFT lung is clear. No acute or suspicious osseous finding. IMPRESSION: New RIGHT lower lobe opacity, suspicious for pneumonia, alternatively atelectasis or asymmetric edema. Recommend follow-up chest x-ray to ensure resolution. Stable cardiomegaly. Electronically Signed   By: Bary Richard M.D.   On: 01/21/2018 21:15    EKG: Independently reviewed.  Normal sinus rhythm with poor R wave progression and tall T waves.  Assessment/Plan Principal Problem:   Acute on chronic systolic CHF (congestive heart failure) (HCC) Active Problems:   Hypertension   Polycystic kidney   ICD (implantable cardioverter-defibrillator) in place   Acute renal failure superimposed on stage 4 chronic kidney disease (HCC)   Gout    1. Acute on chronic systolic heart failure last EF measured was 20 to 25% with ICD placement.  Patient is on Lasix had received Lasix 160 mg in the ER and I have placed on 160 every 12 hourly.  Closely follow intake output metabolic panel daily weights.  Please notify heart failure team in the morning.  Not on ARB or ACE inhibitor due to renal failure.  Patient is on BiDil.  Chest x-ray was showing possible infiltrate and patient has some cough for which I have ordered procalcitonin to see if there is any infectious source. 2. Acute on chronic renal failure stage IV-V with hyperkalemia patient has received D50 and IV insulin for the hyperkalemia and also received Lasix.  Follow metabolic panel.  Nephrology was notified by ER physician.  Patient has history of polycystic kidney disease.  Patient  may be progressing to end-stage renal. 3. Hypertension on BiDil. 4. Anemia likely from chronic disease.  Follow CBC. 5. Abdominal pain I have ordered CT abdomen pelvis.   DVT prophylaxis: Heparin. Code Status: Full code. Family Communication: Patient's family. Disposition Plan: Home. Consults called: Nephrology was notified by the ER physician. Admission status: Inpatient.   Eduard Clos MD [facility] Pager [phone].  If 7PM-7AM, please contact night-coverage [email or web address] Password [email or web address]  01/22/2018, 2:17 AM

## 2018-01-22 NOTE — ED Notes (Signed)
Provider Dr. Toniann Fail paged and made aware of pt critical troponin.

## 2018-01-22 NOTE — ED Notes (Signed)
Nurse aware patient has room

## 2018-01-23 LAB — RENAL FUNCTION PANEL
Albumin: 3.3 g/dL — ABNORMAL LOW (ref 3.5–5.0)
Anion gap: 18 — ABNORMAL HIGH (ref 5–15)
BUN: 170 mg/dL — AB (ref 6–20)
CALCIUM: 9.6 mg/dL (ref 8.9–10.3)
CHLORIDE: 89 mmol/L — AB (ref 101–111)
CO2: 28 mmol/L (ref 22–32)
CREATININE: 6.07 mg/dL — AB (ref 0.61–1.24)
GFR, EST AFRICAN AMERICAN: 10 mL/min — AB (ref >60)
GFR, EST NON AFRICAN AMERICAN: 9 mL/min — AB (ref >60)
Glucose, Bld: 119 mg/dL — ABNORMAL HIGH (ref 65–99)
Phosphorus: 6.7 mg/dL — ABNORMAL HIGH (ref 2.5–4.6)
Potassium: 4.5 mmol/L (ref 3.5–5.1)
SODIUM: 135 mmol/L (ref 135–145)

## 2018-01-23 LAB — CBC WITH DIFFERENTIAL/PLATELET
Basophils Absolute: 0 10*3/uL (ref 0.0–0.1)
Basophils Relative: 0 %
EOS ABS: 0.2 10*3/uL (ref 0.0–0.7)
Eosinophils Relative: 2 %
HCT: 35.1 % — ABNORMAL LOW (ref 39.0–52.0)
Hemoglobin: 10.6 g/dL — ABNORMAL LOW (ref 13.0–17.0)
LYMPHS ABS: 0.9 10*3/uL (ref 0.7–4.0)
Lymphocytes Relative: 9 %
MCH: 20.5 pg — ABNORMAL LOW (ref 26.0–34.0)
MCHC: 30.2 g/dL (ref 30.0–36.0)
MCV: 67.8 fL — ABNORMAL LOW (ref 78.0–100.0)
MONO ABS: 0.9 10*3/uL (ref 0.1–1.0)
Monocytes Relative: 9 %
NEUTROS PCT: 80 %
Neutro Abs: 7.9 10*3/uL — ABNORMAL HIGH (ref 1.7–7.7)
PLATELETS: 444 10*3/uL — AB (ref 150–400)
RBC: 5.18 MIL/uL (ref 4.22–5.81)
RDW: 18.8 % — AB (ref 11.5–15.5)
WBC: 9.9 10*3/uL (ref 4.0–10.5)

## 2018-01-23 MED ORDER — FUROSEMIDE 10 MG/ML IJ SOLN
120.0000 mg | Freq: Three times a day (TID) | INTRAVENOUS | Status: DC
  Administered 2018-01-23: 120 mg via INTRAVENOUS
  Filled 2018-01-23: qty 10
  Filled 2018-01-23: qty 12

## 2018-01-23 MED ORDER — FUROSEMIDE 10 MG/ML IJ SOLN
40.0000 mg | Freq: Two times a day (BID) | INTRAMUSCULAR | Status: DC
  Administered 2018-01-24: 40 mg via INTRAVENOUS
  Filled 2018-01-23: qty 4

## 2018-01-23 NOTE — Progress Notes (Signed)
PROGRESS NOTE    Tysheem Accardo  WUJ:[id number] DOB: June 16, 1956 DOA: 01/21/2018 PCP: Corky Downs, MD   Brief Narrative: 62 y.o. male with history of chronic systolic heart failure status post ICD, COPD, hypertension, polycystic kidney with stage IV kidney disease, anemia presents to the ER with complaint of worsening shortness of breath over the last 1 week.  Patient also has gained increasing fluid in the abdomen genitals and lower extremity.  Prior to last week patient has benign persistent nausea vomiting for last 2 weeks.  But over the last 2 to 3 days patient has been able to keep in food.  Has chronic abdominal pain from hernia in the epigastric area.  Denies any chest pain has some productive cough.  Denies any fever chills.  Has been taking his Bumex.  2 weeks ago patient had taken Uloric for gout following which patient had some tremors and had to discontinue.  Patient was briefly placed on Cogentin and chlordiazepoxide which patient has already taken 1 or 2 doses.  ED Course: In the ER patient appears volume overloaded with chest x-ray showing congestion versus infiltrates BNP was more than 4500 creatinine has worsened from last month of 3.7-5.8 with potassium of 5.8.  EKG shows normal sinus rhythm with some tall T waves.  Was given D50 and insulin for the hyperkalemia and ER physician had discussed with on-call nephrologist about the worsening renal function and CHF who advised to give Lasix 160 mg which has been ordered.  On exam patient has mild abdominal tenderness.   Assessment & Plan:   Principal Problem:   Acute on chronic systolic CHF (congestive heart failure) (HCC) Active Problems:   Hypertension   Polycystic kidney   ICD (implantable cardioverter-defibrillator) in place   Acute renal failure superimposed on stage 4 chronic kidney disease (HCC)   Gout  1. Acute on chronic systolic heart failure last EF measured was 20 to 25% with ICD placement.    He is on Lasix every 8  hrs 120 mg.  Unfortunately his BUN and creatinine is climbed up again.  Still appears volume overloaded.  Renew BiDil.  Heart failure team following await recommendations.  Repeat chest x-ray 516 shows persistent opacity at the right lung base which is stable.  Again suspicious for  pneumonia. 2. Acute on chronic renal failure stage IV-V/polycystic kidney disease-potassium normalized.  Follow metabolic panel.  Nephrology followingHypertension on BiDil.  Might need dialysis if no improvement shown.  Daily BMP. 3. Anemia likely from chronic disease.  Follow CBC.   DVT prophylaxis: Heparin Code Status: Full code Family Communication: No family available Disposition Plan: To be determined  Consultants: Cardiology, nephrology  Procedures: None Antimicrobials: None  Subjective: Patient resting on in no acute distress. he was sleeping when I walked into the room I was able to wake him up without any difficulty.  Requesting he wanted was he was hungry and asked for food.  He denied any shortness of breath chest pain or discomfort nausea or vomiting at this time he reports his diarrhea is better today compared to yesterday.  Objective: Vitals:   01/22/18 2120 01/22/18 2302 01/23/18 0541 01/23/18 0820  BP: 104/71 (!) 140/100 (!) 133/94 (!) 141/83  Pulse: 87  97 90  Resp: (!) 23 (!) 23 15 18   Temp: (!) 97.5 F (36.4 C)  98 F (36.7 C) 97.7 F (36.5 C)  TempSrc: Oral  Oral Oral  SpO2:    100%  Weight:   60.7 kg (133 lb  12.8 oz)     Intake/Output Summary (Last 24 hours) at 01/23/2018 1129 Last data filed at 01/23/2018 1000 Gross per 24 hour  Intake 456 ml  Output 4750 ml  Net -4294 ml   Filed Weights   01/22/18 1450 01/23/18 0541  Weight: 64.1 kg (141 lb 4.8 oz) 60.7 kg (133 lb 12.8 oz)    Examination:  General exam: Appears calm and comfortable  Respiratory system: Crackles at the bases auscultation. Respiratory effort normal. Cardiovascular system: S1 & S2 heard, RRR. No JVD,  murmurs, rubs, gallops or clicks. No pedal edema. Gastrointestinal system: Abdomen is nondistended, soft and nontender. No organomegaly or masses felt. Normal bowel sounds heard. Central nervous system: Alert and oriented. No focal neurological deficits. Extremities:edema 1 plus Skin: No rashes, lesions or ulcers Psychiatry: Judgement and insight appear normal. Mood & affect appropriate.     Data Reviewed: I have personally reviewed following labs and imaging studies  CBC: Recent Labs  Lab 01/21/18 1941 01/22/18 0502 01/23/18 0601  WBC 8.6 7.4 9.9  NEUTROABS 7.1 6.2 7.9*  HGB 10.9* 10.9* 10.6*  HCT 36.2* 34.4* 35.1*  MCV 68.2* 65.9* 67.8*  PLT 485* 368 444*   Basic Metabolic Panel: Recent Labs  Lab 01/21/18 1003 01/21/18 1940 01/22/18 0502 01/23/18 0601  NA 132* 131* 133* 135  K 5.7* 5.8* 5.2* 4.5  CL 90* 89* 92* 89*  CO2 24 21* 20* 28  GLUCOSE 142* 93 55* 119*  BUN 160* 162* 163* 170*  CREATININE 5.65* 5.88* 6.00* 6.07*  CALCIUM 9.4 9.4 9.4 9.6  PHOS  --   --   --  6.7*   GFR: Estimated Creatinine Clearance: 11 mL/min (A) (by C-G formula based on SCr of 6.07 mg/dL (H)). Liver Function Tests: Recent Labs  Lab 01/21/18 1940 01/22/18 0502 01/23/18 0601  AST 73* 66*  --   ALT 48 47  --   ALKPHOS 81 72  --   BILITOT 2.4* 1.8*  --   PROT 6.8 6.3*  --   ALBUMIN 3.2* 3.1* 3.3*   Recent Labs  Lab 01/22/18 0502  LIPASE 47   No results for input(s): AMMONIA in the last 168 hours. Coagulation Profile: No results for input(s): INR, PROTIME in the last 168 hours. Cardiac Enzymes: Recent Labs  Lab 01/22/18 0502 01/22/18 1047 01/22/18 1641  TROPONINI 0.09* 0.10* 0.08*   BNP (last 3 results) No results for input(s): PROBNP in the last 8760 hours. HbA1C: No results for input(s): HGBA1C in the last 72 hours. CBG: Recent Labs  Lab 01/22/18 0053  GLUCAP 98   Lipid Profile: No results for input(s): CHOL, HDL, LDLCALC, TRIG, CHOLHDL, LDLDIRECT in the last  72 hours. Thyroid Function Tests: No results for input(s): TSH, T4TOTAL, FREET4, T3FREE, THYROIDAB in the last 72 hours. Anemia Panel: No results for input(s): VITAMINB12, FOLATE, FERRITIN, TIBC, IRON, RETICCTPCT in the last 72 hours. Sepsis Labs: Recent Labs  Lab 01/22/18 0550  PROCALCITON 2.12    Recent Results (from the past 240 hour(s))  C difficile quick scan w PCR reflex     Status: Abnormal   Collection Time: 01/22/18  2:48 PM  Result Value Ref Range Status   C Diff antigen POSITIVE (A) NEGATIVE Final   C Diff toxin POSITIVE (A) NEGATIVE Final   C Diff interpretation   Final    CRITICAL RESULT CALLED TO, READ BACK BY AND VERIFIED WITH:    Comment: R.TURGOTT,RN AT 2000 BY L.PITT  01/22/18  MRSA PCR Screening  Status: None   Collection Time: 01/22/18  2:50 PM  Result Value Ref Range Status   MRSA by PCR NEGATIVE NEGATIVE Final    Comment:        The GeneXpert MRSA Assay (FDA approved for NASAL specimens only), is one component of a comprehensive MRSA colonization surveillance program. It is not intended to diagnose MRSA infection nor to guide or monitor treatment for MRSA infections. Performed at [facility], [address] [place], [place] [address]          Radiology Studies: Ct Abdomen Pelvis Wo Contrast  Result Date: 01/22/2018 CLINICAL DATA:  Acute onset of nausea and vomiting. EXAM: CT ABDOMEN AND PELVIS WITHOUT CONTRAST TECHNIQUE: Multidetector CT imaging of the abdomen and pelvis was performed following the standard protocol without IV contrast. COMPARISON:  Renal ultrasound performed 07/08/2017 FINDINGS: Lower chest: Moderate right and small left pleural effusions are noted, with mild atelectasis at the lung bases. The heart is mildly enlarged. An AICD lead is noted. Hepatobiliary: Scattered nonspecific hypodensities are seen within the liver. The liver is otherwise grossly unremarkable. The gallbladder is not well assessed given surrounding  ascites. The common bile duct remains normal in caliber. Pancreas: The pancreas is within normal limits. Spleen: The spleen is unremarkable in appearance. Adrenals/Urinary Tract: The adrenal glands are grossly unremarkable in appearance. Innumerable cysts are seen throughout both kidneys. Multiple heterogeneous masses are seen arising from both kidneys, the largest of which measures 3.9 cm at the upper pole of the left kidney. Malignancy cannot be excluded. There is no evidence of hydronephrosis. No renal or ureteral stones are identified. Scattered parenchymal calcifications are noted bilaterally. Stomach/Bowel: The stomach is unremarkable in appearance. The small bowel is within normal limits. The appendix is normal in caliber, without evidence of appendicitis. The colon is unremarkable in appearance. Vascular/Lymphatic: Diffuse calcification is seen along the abdominal aorta and its branches. The abdominal aorta is otherwise grossly unremarkable. The inferior vena cava is grossly unremarkable. No retroperitoneal lymphadenopathy is seen. No pelvic sidewall lymphadenopathy is identified. Reproductive: The bladder is mildly distended and grossly unremarkable. The prostate remains normal in size. Other: Moderate volume ascites is seen within the abdomen and pelvis. Musculoskeletal: No acute osseous abnormalities are identified. The visualized musculature is unremarkable in appearance. IMPRESSION: 1. No definite acute abnormality seen to explain the patient's symptoms. 2. Moderate right and small left pleural effusions, with mild atelectasis at the lung bases. 3. Multiple heterogeneous masses arising from both kidneys, the largest of which measures 3.9 cm at the upper pole of the left kidney. Malignancy cannot be excluded. These could be further assessed on MRI of the abdomen with and without contrast, as deemed clinically appropriate. 4. Moderate volume ascites within the abdomen and pelvis. 5. Scattered nonspecific  hypodensities within the liver. 6. Mild cardiomegaly. Aortic Atherosclerosis (ICD10-I70.0). Electronically Signed   By: Roanna Raider M.D.   On: 01/22/2018 03:57   Dg Chest 2 View  Result Date: 01/22/2018 CLINICAL DATA:  Pt complains of SOB x 1 week. Past week he had some nausea, vomiting and diarrhea. Hx CHF, HTN, COPD. EXAM: CHEST - 2 VIEW COMPARISON:  Chest x-rays dated 01/21/2018, 11/29/2017 and 09/09/2017. FINDINGS: Grossly stable cardiomegaly. LEFT chest wall pacemaker/ICD hardware appears stable in position with intact lead. Persistent opacity at the RIGHT lung base, not significantly changed compared to yesterday's exam. Lungs otherwise clear. No pneumothorax or significant pleural effusions seen. Blunting of the costophrenic angles could represent small bilateral pleural effusions or  chronic pleural thickening. No acute or suspicious osseous finding. IMPRESSION: 1. Persistent opacity at the RIGHT lung base, stable compared to yesterday's exam, new compared to earlier chest x-rays, again suspicious for pneumonia. 2. Stable cardiomegaly. Electronically Signed   By: Bary Richard M.D.   On: 01/22/2018 20:00   Dg Chest 2 View  Result Date: 01/21/2018 CLINICAL DATA:  Abdominal and lower extremity edema. Abnormal labs. Shortness of breath. EXAM: CHEST - 2 VIEW COMPARISON:  Chest x-rays dated 11/29/2017 and 10/03/2017 FINDINGS: Stable cardiomegaly. LEFT chest wall ICD apparatus appears stable in position with intact lead. Subtle new opacity within the RIGHT lower lobe, suspicious for pneumonia, alternatively atelectasis or asymmetric edema. LEFT lung is clear. No acute or suspicious osseous finding. IMPRESSION: New RIGHT lower lobe opacity, suspicious for pneumonia, alternatively atelectasis or asymmetric edema. Recommend follow-up chest x-ray to ensure resolution. Stable cardiomegaly. Electronically Signed   By: Bary Richard M.D.   On: 01/21/2018 21:15        Scheduled Meds: . cholestyramine  light  4 g Oral TID  . heparin  5,000 Units Subcutaneous Q8H  . isosorbide-hydrALAZINE  2 tablet Oral TID  . saccharomyces boulardii  250 mg Oral BID  . vancomycin  125 mg Oral QID   Continuous Infusions: . furosemide       LOS: 1 day     Alwyn Ren, MD [facility] If 7PM-7AM, please contact night-coverage [email or web address] Password [facility] 01/23/2018, 11:29 AM

## 2018-01-23 NOTE — Progress Notes (Addendum)
Advanced Heart Failure Rounding Note  PCP-Cardiologist: No primary care provider on file.   Subjective:    Negative 2.7 L and weight down 8 lbs.   Sleepy but arouses easily. A&O x 3. No tremor at the moment. Denies SOB lying in bed. Nausea improved. No orthopnea or PND.   Cr up to 6.07. BUN 170. K 4.5  Objective:   Weight Range: 133 lb 12.8 oz (60.7 kg) Body mass index is 19.76 kg/m.   Vital Signs:   Temp:  [97.4 F (36.3 C)-98 F (36.7 C)] 98 F (36.7 C) (05/17 0541) Pulse Rate:  [85-97] 97 (05/17 0541) Resp:  [14-29] 15 (05/17 0541) BP: (104-142)/(71-109) 133/94 (05/17 0541) SpO2:  [35 %-100 %] 35 % (05/16 1445) Weight:  [133 lb 12.8 oz (60.7 kg)-141 lb 4.8 oz (64.1 kg)] 133 lb 12.8 oz (60.7 kg) (05/17 0541) Last BM Date: 01/22/18  Weight change: Filed Weights   01/22/18 1450 01/23/18 0541  Weight: 141 lb 4.8 oz (64.1 kg) 133 lb 12.8 oz (60.7 kg)    Intake/Output:   Intake/Output Summary (Last 24 hours) at 01/23/2018 0748 Last data filed at 01/23/2018 0537 Gross per 24 hour  Intake 456 ml  Output 3200 ml  Net -2744 ml      Physical Exam    General:  Appears chronically ill. Cachectic. NAD  HEENT: Normal. anicteric Neck: Supple. JVP to jaw. Carotids 2+ bilat; no bruits. No lymphadenopathy or thyromegaly appreciated. Cor: PMI laterally displaced. Regular mildly tachy +S3 Lungs: Clear no wheeze  Abdomen: Soft, nontender, nondistended. No hepatosplenomegaly. No bruits or masses. Good bowel sounds. Extremities: No cyanosis, clubbing, or rash. BLE 1-2+ edema.  Neuro: Alert & orientedx3, cranial nerves grossly intact. moves all 4 extremities w/o difficulty. Affect flat but appropriate.   Telemetry   NSR 90s, personally reviewed.  EKG    No new tracings.    Labs    CBC Recent Labs    01/22/18 0502 01/23/18 0601  WBC 7.4 9.9  NEUTROABS 6.2 PENDING  HGB 10.9* 10.6*  HCT 34.4* 35.1*  MCV 65.9* 67.8*  PLT 368 444*   Basic Metabolic  Panel Recent Labs    01/22/18 0502 01/23/18 0601  NA 133* 135  K 5.2* 4.5  CL 92* 89*  CO2 20* 28  GLUCOSE 55* 119*  BUN 163* 170*  CREATININE 6.00* 6.07*  CALCIUM 9.4 9.6  PHOS  --  6.7*   Liver Function Tests Recent Labs    01/21/18 1940 01/22/18 0502 01/23/18 0601  AST 73* 66*  --   ALT 48 47  --   ALKPHOS 81 72  --   BILITOT 2.4* 1.8*  --   PROT 6.8 6.3*  --   ALBUMIN 3.2* 3.1* 3.3*   Recent Labs    01/22/18 0502  LIPASE 47   Cardiac Enzymes Recent Labs    01/22/18 0502 01/22/18 1047 01/22/18 1641  TROPONINI 0.09* 0.10* 0.08*    BNP: BNP (last 3 results) Recent Labs    10/03/17 0111 11/26/17 0610 01/21/18 1940  BNP 1,644.0* 2,282.0* >4,500.0*    ProBNP (last 3 results) No results for input(s): PROBNP in the last 8760 hours.   D-Dimer No results for input(s): DDIMER in the last 72 hours. Hemoglobin A1C No results for input(s): HGBA1C in the last 72 hours. Fasting Lipid Panel No results for input(s): CHOL, HDL, LDLCALC, TRIG, CHOLHDL, LDLDIRECT in the last 72 hours. Thyroid Function Tests No results for input(s): TSH, T4TOTAL, T3FREE, THYROIDAB  in the last 72 hours.  Invalid input(s): FREET3  Other results:   Imaging    Dg Chest 2 View  Result Date: 01/22/2018 CLINICAL DATA:  Pt complains of SOB x 1 week. Past week he had some nausea, vomiting and diarrhea. Hx CHF, HTN, COPD. EXAM: CHEST - 2 VIEW COMPARISON:  Chest x-rays dated 01/21/2018, 11/29/2017 and 09/09/2017. FINDINGS: Grossly stable cardiomegaly. LEFT chest wall pacemaker/ICD hardware appears stable in position with intact lead. Persistent opacity at the RIGHT lung base, not significantly changed compared to yesterday's exam. Lungs otherwise clear. No pneumothorax or significant pleural effusions seen. Blunting of the costophrenic angles could represent small bilateral pleural effusions or chronic pleural thickening. No acute or suspicious osseous finding. IMPRESSION: 1. Persistent  opacity at the RIGHT lung base, stable compared to yesterday's exam, new compared to earlier chest x-rays, again suspicious for pneumonia. 2. Stable cardiomegaly. Electronically Signed   By: Bary Richard M.D.   On: 01/22/2018 20:00      Medications:     Scheduled Medications: . cholestyramine light  4 g Oral TID  . heparin  5,000 Units Subcutaneous Q8H  . isosorbide-hydrALAZINE  2 tablet Oral TID  . saccharomyces boulardii  250 mg Oral BID  . vancomycin  125 mg Oral QID     Infusions: . 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   Admitted with AKI on CKD and A/C systolic heart failure.   Assessment/Plan   1. AKI on CKD Stage IV - has severe Polycystic Kidney Disease - Baseline Cr. 3.6-3.8. Up to 5.88 -> 6.0 on admit. BUN 170 today.  - Follow BMET daily.  - Nephrology following. He seems to be excreting but not filtering on high dose IV lasix. May need HD.   2. A/C Systolic HF, NICM. ECHO 06/2017 EF 25% - Remains volume overloaded on exam.  - Good UOP yesterday on IV lasix, but BUN remains severely elevated.  - Continue lasix 120 mg q 8 hrs per renal.  - No bb with recent low output.  - Allergic to entresto, carvedilol, torsemide, and ace - Continue bidil as tolerated.   3. Hyperkalemia - 5.8 on admit, treated with D50 and insulin.  - K 4.5 today. Follow.   4. HCV - He has been followed by ID and has been on mavyret per ID - No change to current plan.    5. Gout  - Allergic to allopurinol, prednisone, and uloric.  - Per primary.    Medication concerns reviewed with patient and pharmacy team. Barriers identified: None at this time.   Length of Stay: 1  Dodger, Shy  01/23/2018,  7:48 AM  Advanced Heart Failure Team Pager [phone] (M-F; 7a - 4p)  Please contact [facility] for night-coverage after hours (4p -7a ) and weekends on [email or web address]  Patient seen and examined with the above-signed Advanced Practice Provider and/or Housestaff. I personally reviewed laboratory data, imaging studies and relevant notes. I independently examined the patient and formulated the important aspects of the plan. I have edited the note to reflect any of my changes or salient points. I have personally discussed the plan with the patient and/or family.  Excellent response to IV lasix but Cr  still > 6.0 and BUN 170. Likely mild uremia. Volume status now much improved. BP stable. Hyperkalemia improved.   Discussed with Renal this am. Will await decision on timing of HD. I am worried that he will struggle to tolerate long-term HD with poor LV function but it is only option. Can start dobutamine if BP drops. Previously not felt to be heart-kidney transplant candidate due to anxiety and compliance issues.   I will see again on Monday if he is still in house.   Arvilla Meres, MD  9:50 AM

## 2018-01-23 NOTE — Progress Notes (Signed)
Subjective:  4000 of UOP- BUN up and was already 163.  He admits to N/V/itching but is pleased that swelling is better Objective Vital signs in last 24 hours: Vitals:   01/22/18 2302 01/23/18 0541 01/23/18 0820 01/23/18 1221  BP: (!) 140/100 (!) 133/94 (!) 141/83 (!) 130/95  Pulse:  97 90 88  Resp: (!) 23 15 18 20   Temp:  98 F (36.7 C) 97.7 F (36.5 C) 97.6 F (36.4 C)  TempSrc:  Oral Oral Oral  SpO2:   100% 100%  Weight:  60.7 kg (133 lb 12.8 oz)     Weight change:   Intake/Output Summary (Last 24 hours) at 01/23/2018 1432 Last data filed at 01/23/2018 1000 Gross per 24 hour  Intake 390 ml  Output 4300 ml  Net -3910 ml    Assessment/ Plan: Pt is a 62 y.o. 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 Renal- appears to me like progressive CKD- now GFR in the single digits and from what I can tell is having uremic sxms.  I talked to him about the fact that his BUN is 8 times normal and I felt he was having uremic sxms and my recommendation would be to initiate dialysis.  He says he is scared to death, his nephrologist told him he did not need it yet ([place])  had me talk to his wife who is interested in alternate procedures but I am not sure what she is referring to (?PD )  Will cont with this discussion tomorrow 2. HTN/vol- has responded exceedingly well to lasix IV- will decrease dose.  His blood pressure is high enough so I think we would not have the same issues with pt that we have with others with chronically low BP- is on bidil   3. Anemia- not to a critical level- will check iron stores and replete if needed  4. Secondary hyperparathyroidism- phos is high with highish calc- start non calc contain binder.  Check PTH  5. Hyperkalemia- has corrected    Grantham Hippert  A    Labs: Basic Metabolic Panel: Recent Labs  Lab 01/21/18 1940 01/22/18 0502 01/23/18 0601  NA 131* 133* 135  K 5.8* 5.2* 4.5  CL 89* 92* 89*  CO2 21* 20* 28  GLUCOSE 93 55* 119*  BUN 162* 163* 170*  CREATININE 5.88* 6.00* 6.07*  CALCIUM 9.4 9.4 9.6  PHOS  --   --  6.7*   Liver Function Tests: Recent Labs  Lab 01/21/18 1940 01/22/18 0502 01/23/18 0601  AST 73* 66*  --   ALT 48 47  --   ALKPHOS 81 72  --   BILITOT 2.4* 1.8*  --   PROT 6.8 6.3*  --   ALBUMIN 3.2* 3.1* 3.3*   Recent Labs  Lab 01/22/18 0502  LIPASE 47   No results for input(s): AMMONIA in the last 168 hours. CBC: Recent Labs  Lab 01/21/18 1941 01/22/18 0502 01/23/18 0601  WBC 8.6 7.4 9.9  NEUTROABS 7.1 6.2 7.9*  HGB 10.9* 10.9* 10.6*  HCT 36.2* 34.4* 35.1*  MCV 68.2* 65.9* 67.8*  PLT 485* 368 444*   Cardiac Enzymes: Recent Labs  Lab 01/22/18 0502 01/22/18 1047 01/22/18 1641  TROPONINI 0.09* 0.10* 0.08*   CBG: Recent Labs  Lab 01/22/18 0053  GLUCAP 98  Iron Studies: No results for input(s): IRON, TIBC, TRANSFERRIN, FERRITIN in the last 72 hours. Studies/Results: Ct Abdomen Pelvis Wo Contrast  Result Date: 01/22/2018 CLINICAL DATA:  Acute onset of nausea and vomiting. EXAM: CT ABDOMEN AND PELVIS WITHOUT CONTRAST TECHNIQUE: Multidetector CT imaging of the abdomen and pelvis was performed following the standard protocol without IV contrast. COMPARISON:  Renal ultrasound performed 07/08/2017 FINDINGS: Lower chest: Moderate right and small left pleural effusions are noted, with mild atelectasis at the lung bases. The heart is mildly enlarged. An AICD lead is noted. Hepatobiliary: Scattered nonspecific hypodensities are seen within the liver. The liver is otherwise grossly unremarkable. The gallbladder is not well assessed given surrounding ascites. The common bile duct remains normal in caliber. Pancreas: The pancreas is within normal limits. Spleen: The spleen is unremarkable in  appearance. Adrenals/Urinary Tract: The adrenal glands are grossly unremarkable in appearance. Innumerable cysts are seen throughout both kidneys. Multiple heterogeneous masses are seen arising from both kidneys, the largest of which measures 3.9 cm at the upper pole of the left kidney. Malignancy cannot be excluded. There is no evidence of hydronephrosis. No renal or ureteral stones are identified. Scattered parenchymal calcifications are noted bilaterally. Stomach/Bowel: The stomach is unremarkable in appearance. The small bowel is within normal limits. The appendix is normal in caliber, without evidence of appendicitis. The colon is unremarkable in appearance. Vascular/Lymphatic: Diffuse calcification is seen along the abdominal aorta and its branches. The abdominal aorta is otherwise grossly unremarkable. The inferior vena cava is grossly unremarkable. No retroperitoneal lymphadenopathy is seen. No pelvic sidewall lymphadenopathy is identified. Reproductive: The bladder is mildly distended and grossly unremarkable. The prostate remains normal in size. Other: Moderate volume ascites is seen within the abdomen and pelvis. Musculoskeletal: No acute osseous abnormalities are identified. The visualized musculature is unremarkable in appearance. IMPRESSION: 1. No definite acute abnormality seen to explain the patient's symptoms. 2. Moderate right and small left pleural effusions, with mild atelectasis at the lung bases. 3. Multiple heterogeneous masses arising from both kidneys, the largest of which measures 3.9 cm at the upper pole of the left kidney. Malignancy cannot be excluded. These could be further assessed on MRI of the abdomen with and without contrast, as deemed clinically appropriate. 4. Moderate volume ascites within the abdomen and pelvis. 5. Scattered nonspecific hypodensities within the liver. 6. Mild cardiomegaly. Aortic Atherosclerosis (ICD10-I70.0). Electronically Signed   By: Roanna Raider M.D.    On: 01/22/2018 03:57   Dg Chest 2 View  Result Date: 01/22/2018 CLINICAL DATA:  Pt complains of SOB x 1 week. Past week he had some nausea, vomiting and diarrhea. Hx CHF, HTN, COPD. EXAM: CHEST - 2 VIEW COMPARISON:  Chest x-rays dated 01/21/2018, 11/29/2017 and 09/09/2017. FINDINGS: Grossly stable cardiomegaly. LEFT chest wall pacemaker/ICD hardware appears stable in position with intact lead. Persistent opacity at the RIGHT lung base, not significantly changed compared to yesterday's exam. Lungs otherwise clear. No pneumothorax or significant pleural effusions seen. Blunting of the costophrenic angles could represent small bilateral pleural effusions or chronic pleural thickening. No acute or suspicious osseous finding. IMPRESSION: 1. Persistent opacity at the RIGHT lung base, stable compared to yesterday's exam, new compared to earlier chest x-rays, again suspicious for pneumonia. 2. Stable cardiomegaly. Electronically Signed   By: Bary Richard M.D.   On: 01/22/2018 20:00   Dg Chest 2 View  Result Date: 01/21/2018 CLINICAL DATA:  Abdominal and lower extremity edema. Abnormal labs. Shortness of breath. EXAM: CHEST - 2 VIEW COMPARISON:  Chest x-rays dated 11/29/2017 and 10/03/2017 FINDINGS: Stable cardiomegaly. LEFT chest wall ICD apparatus appears stable in position with intact lead. Subtle new opacity within the RIGHT lower lobe, suspicious for pneumonia, alternatively atelectasis or asymmetric edema. LEFT lung is clear. No acute or suspicious osseous finding. IMPRESSION: New RIGHT lower lobe opacity, suspicious for pneumonia, alternatively atelectasis or asymmetric edema. Recommend follow-up chest x-ray to ensure resolution. Stable cardiomegaly. Electronically Signed   By: Bary Richard M.D.   On: 01/21/2018 21:15   Medications: Infusions: . furosemide 120 mg (01/23/18 1329)    Scheduled Medications: . cholestyramine light  4 g Oral TID  . heparin  5,000 Units Subcutaneous Q8H  .  isosorbide-hydrALAZINE  2 tablet Oral TID  . saccharomyces boulardii  250 mg Oral BID  . vancomycin  125 mg Oral QID    have reviewed scheduled and prn medications.  Physical Exam: General: Very sleepy- nervous once conversation came around to dialysis Heart: RRR Lungs: mostly clear Abdomen: slightly distended, non tender Extremities: some edema- pt says improved Dialysis Access: not yet    01/23/2018,2:32 PM  LOS: 1 day

## 2018-01-24 ENCOUNTER — Other Ambulatory Visit: Payer: Self-pay

## 2018-01-24 LAB — RENAL FUNCTION PANEL
ALBUMIN: 3.1 g/dL — AB (ref 3.5–5.0)
Anion gap: 16 — ABNORMAL HIGH (ref 5–15)
BUN: 148 mg/dL — ABNORMAL HIGH (ref 6–20)
CALCIUM: 9 mg/dL (ref 8.9–10.3)
CO2: 28 mmol/L (ref 22–32)
CREATININE: 4.6 mg/dL — AB (ref 0.61–1.24)
Chloride: 91 mmol/L — ABNORMAL LOW (ref 101–111)
GFR, EST AFRICAN AMERICAN: 15 mL/min — AB (ref >60)
GFR, EST NON AFRICAN AMERICAN: 13 mL/min — AB (ref >60)
Glucose, Bld: 118 mg/dL — ABNORMAL HIGH (ref 65–99)
Phosphorus: 4.7 mg/dL — ABNORMAL HIGH (ref 2.5–4.6)
Potassium: 3.5 mmol/L (ref 3.5–5.1)
SODIUM: 135 mmol/L (ref 135–145)

## 2018-01-24 MED ORDER — ONDANSETRON HCL 4 MG/2ML IJ SOLN
4.0000 mg | Freq: Four times a day (QID) | INTRAMUSCULAR | Status: DC | PRN
  Administered 2018-01-24: 4 mg via INTRAVENOUS
  Filled 2018-01-24: qty 2

## 2018-01-24 NOTE — Progress Notes (Signed)
PROGRESS NOTE    Kelly Leonard  ZOX:[id number] DOB: 1955-10-18 DOA: 01/21/2018 PCP: Corky Downs, MD  Brief Narrative: 62 y.o.malewithhistory of chronic systolic heart failure status post ICD, COPD, hypertension, polycystic kidney with stage IV kidney disease, anemia presents to the ER with complaint of worsening shortness of breath over the last 1 week. Patient also has gained increasing fluid in the abdomen genitals and lower extremity. Prior to last week patient has benign persistent nausea vomiting for last 2 weeks. But over the last 2 to 3 days patient has been able to keep in food. Has chronic abdominal pain from hernia in the epigastric area. Denies any chest pain has some productive cough. Denies any fever chills. Has been taking his Bumex. 2 weeks ago patient had taken Uloric for gout following which patient had some tremors and had to discontinue. Patient was briefly placed on Cogentin and chlordiazepoxide which patient has already taken 1 or 2 doses.  ED Course:In the ER patient appears volume overloaded with chest x-ray showing congestion versus infiltrates BNP was more than 4500 creatinine has worsened from last month of 3.7-5.8 with potassium of 5.8. EKG shows normal sinus rhythm with some tall T waves. Was given D50 and insulin for the hyperkalemia and ER physician had discussed with on-call nephrologist about the worsening renal function and CHF who advised to give Lasix 160 mg which has been ordered. On exam patient has mild abdominal tenderness.      Assessment & Plan:   Principal Problem:   Acute on chronic systolic CHF (congestive heart failure) (HCC) Active Problems:   Hypertension   Polycystic kidney   ICD (implantable cardioverter-defibrillator) in place   Acute renal failure superimposed on stage 4 chronic kidney disease (HCC)   Gout  1. Acute on chronic systolic heart failure last EF measured was 20 to 25% with ICD placement.   He is on Lasix  every 8 hrs 120 mg.  Lasix has been stopped today 01/24/2018 by nephrology.  Patient and his wife is not in favor of starting dialysis at this time.  He is negative by almost 8 L since admission.  2]Acute on chronic renal failure stage IV-V/polycystic kidney disease-potassium normalized. Follow metabolic panel.   3]Hypertension on BiDil  4]Anemia likely from chronic disease.  Stable at this time.  5] C. difficile colitis patient was admitted with nausea vomiting and diarrhea for many days prior to admission to the hospital.  C. difficile antigen and toxin came back positive and was started on vancomycin 01/22/2018.  Continue probiotics.  He reports his diarrhea is better and slow down.     DVT prophylaxis: Heparin Code Status: Full code Family Communication: Discussed with wife who was in the room  Disposition Plan: Plan for discharge Monday or Tuesday of next week provided he is stable and okay with nephrology and cardiology.   Consultants: Heart failure team, nephrology  Procedures: None Antimicrobials: Vancomycin p.o.  Subjective: Feels better has lost a lot of weight denies any shortness of breath   Objective: Vitals:   01/24/18 0131 01/24/18 0400 01/24/18 0418 01/24/18 0855  BP: 123/77  (!) 135/91 135/87  Pulse: 91  89 91  Resp: 14  13   Temp: 98.8 F (37.1 C)  98.4 F (36.9 C) (!) 91 F (32.8 C)  TempSrc: Oral  Oral Oral  SpO2: 99%  100% 100%  Weight:  57.3 kg (126 lb 4.8 oz)      Intake/Output Summary (Last 24 hours) at 01/24/2018 1200 Last  data filed at 01/24/2018 0900 Gross per 24 hour  Intake 1440 ml  Output 4700 ml  Net -3260 ml   Filed Weights   01/22/18 1450 01/23/18 0541 01/24/18 0400  Weight: 64.1 kg (141 lb 4.8 oz) 60.7 kg (133 lb 12.8 oz) 57.3 kg (126 lb 4.8 oz)    Examination:  General exam: Appears calm and comfortable  Respiratory system: Clear to auscultation. Respiratory effort normal. Cardiovascular system: S1 & S2 heard, RRR. No JVD,  murmurs, rubs, gallops or clicks. No pedal edema. Gastrointestinal system: Abdomen is nondistended, soft and nontender. No organomegaly or masses felt. Normal bowel sounds heard. Central nervous system: Alert and oriented. No focal neurological deficits. Extremities: Symmetric 5 x 5 power. Skin: No rashes, lesions or ulcers Psychiatry: Judgement and insight appear normal. Mood & affect appropriate.     Data Reviewed: I have personally reviewed following labs and imaging studies  CBC: Recent Labs  Lab 01/21/18 1941 01/22/18 0502 01/23/18 0601  WBC 8.6 7.4 9.9  NEUTROABS 7.1 6.2 7.9*  HGB 10.9* 10.9* 10.6*  HCT 36.2* 34.4* 35.1*  MCV 68.2* 65.9* 67.8*  PLT 485* 368 444*   Basic Metabolic Panel: Recent Labs  Lab 01/21/18 1003 01/21/18 1940 01/22/18 0502 01/23/18 0601 01/24/18 0828  NA 132* 131* 133* 135 135  K 5.7* 5.8* 5.2* 4.5 3.5  CL 90* 89* 92* 89* 91*  CO2 24 21* 20* 28 28  GLUCOSE 142* 93 55* 119* 118*  BUN 160* 162* 163* 170* 148*  CREATININE 5.65* 5.88* 6.00* 6.07* 4.60*  CALCIUM 9.4 9.4 9.4 9.6 9.0  PHOS  --   --   --  6.7* 4.7*   GFR: Estimated Creatinine Clearance: 13.7 mL/min (A) (by C-G formula based on SCr of 4.6 mg/dL (H)). Liver Function Tests: Recent Labs  Lab 01/21/18 1940 01/22/18 0502 01/23/18 0601 01/24/18 0828  AST 73* 66*  --   --   ALT 48 47  --   --   ALKPHOS 81 72  --   --   BILITOT 2.4* 1.8*  --   --   PROT 6.8 6.3*  --   --   ALBUMIN 3.2* 3.1* 3.3* 3.1*   Recent Labs  Lab 01/22/18 0502  LIPASE 47   No results for input(s): AMMONIA in the last 168 hours. Coagulation Profile: No results for input(s): INR, PROTIME in the last 168 hours. Cardiac Enzymes: Recent Labs  Lab 01/22/18 0502 01/22/18 1047 01/22/18 1641  TROPONINI 0.09* 0.10* 0.08*   BNP (last 3 results) No results for input(s): PROBNP in the last 8760 hours. HbA1C: No results for input(s): HGBA1C in the last 72 hours. CBG: Recent Labs  Lab 01/22/18 0053    GLUCAP 98   Lipid Profile: No results for input(s): CHOL, HDL, LDLCALC, TRIG, CHOLHDL, LDLDIRECT in the last 72 hours. Thyroid Function Tests: No results for input(s): TSH, T4TOTAL, FREET4, T3FREE, THYROIDAB in the last 72 hours. Anemia Panel: No results for input(s): VITAMINB12, FOLATE, FERRITIN, TIBC, IRON, RETICCTPCT in the last 72 hours. Sepsis Labs: Recent Labs  Lab 01/22/18 0550  PROCALCITON 2.12    Recent Results (from the past 240 hour(s))  C difficile quick scan w PCR reflex     Status: Abnormal   Collection Time: 01/22/18  2:48 PM  Result Value Ref Range Status   C Diff antigen POSITIVE (A) NEGATIVE Final   C Diff toxin POSITIVE (A) NEGATIVE Final   C Diff interpretation   Final    CRITICAL RESULT  CALLED TO, READ BACK BY AND VERIFIED WITH:    Comment: R.TURGOTT,RN AT 2000 BY L.PITT  01/22/18  MRSA PCR Screening     Status: None   Collection Time: 01/22/18  2:50 PM  Result Value Ref Range Status   MRSA by PCR NEGATIVE NEGATIVE Final    Comment:        The GeneXpert MRSA Assay (FDA approved for NASAL specimens only), is one component of a comprehensive MRSA colonization surveillance program. It is not intended to diagnose MRSA infection nor to guide or monitor treatment for MRSA infections. Performed at [facility], [address] [place], [place] [address]          Radiology Studies: Dg Chest 2 View  Result Date: 01/22/2018 CLINICAL DATA:  Pt complains of SOB x 1 week. Past week he had some nausea, vomiting and diarrhea. Hx CHF, HTN, COPD. EXAM: CHEST - 2 VIEW COMPARISON:  Chest x-rays dated 01/21/2018, 11/29/2017 and 09/09/2017. FINDINGS: Grossly stable cardiomegaly. LEFT chest wall pacemaker/ICD hardware appears stable in position with intact lead. Persistent opacity at the RIGHT lung base, not significantly changed compared to yesterday's exam. Lungs otherwise clear. No pneumothorax or significant pleural effusions seen. Blunting of the  costophrenic angles could represent small bilateral pleural effusions or chronic pleural thickening. No acute or suspicious osseous finding. IMPRESSION: 1. Persistent opacity at the RIGHT lung base, stable compared to yesterday's exam, new compared to earlier chest x-rays, again suspicious for pneumonia. 2. Stable cardiomegaly. Electronically Signed   By: Bary Richard M.D.   On: 01/22/2018 20:00        Scheduled Meds: . cholestyramine light  4 g Oral TID  . heparin  5,000 Units Subcutaneous Q8H  . isosorbide-hydrALAZINE  2 tablet Oral TID  . saccharomyces boulardii  250 mg Oral BID  . vancomycin  125 mg Oral QID   Continuous Infusions:   LOS: 2 days     Alwyn Ren, MD [facility] If 7PM-7AM, please contact night-coverage [email or web address] Password [email or web address] 01/24/2018, 12:00 PM

## 2018-01-24 NOTE — Progress Notes (Signed)
Subjective:  Had another 4500 of UOP-  No labs yet today- He no longer admits  to N/V/itching - says swelling is gone and wants to go home- wife here- she is wanting to wait as far as initiating dialysis-not this admit- sounds like is not actually seeing a kidney MD on the outside at this point   Objective Vital signs in last 24 hours: Vitals:   01/23/18 2146 01/24/18 0131 01/24/18 0400 01/24/18 0418  BP: (!) 129/94 123/77  (!) 135/91  Pulse: 96 91  89  Resp: 16 14  13   Temp: 98 F (36.7 C) 98.8 F (37.1 C)  98.4 F (36.9 C)  TempSrc: Oral Oral  Oral  SpO2: 100% 99%  100%  Weight:   57.3 kg (126 lb 4.8 oz)    Weight change: -6.804 kg (-15 lb)  Intake/Output Summary (Last 24 hours) at 01/24/2018 0820 Last data filed at 01/24/2018 0800 Gross per 24 hour  Intake 1440 ml  Output 5450 ml  Net -4010 ml    Assessment/ Plan: Pt is a 62 y.o. 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 Renal- appears to me like progressive CKD- now GFR in the single digits and from what I can tell is having uremic sxms although denying them at present.  I talked to him about the fact that his BUN is 8 times normal and I felt he was having uremic sxms and my recommendation would be to initiate dialysis.  He says he is scared to death, his nephrologist told him he did not need it yet (- [facility]) but sounds like not seeing regularly.  I talked to his wife who is interested in holding off- she feels it will get better on its own.  Possibly interested in PD ?  But actually does not seem that interested in general.  Check labs today and tomorrow- I guess is refusing dialysis at this point as well as AVF/AVG placement.  I offered her follow up with [place] and she said that would be OK.  I guess if not with uremic sxms  tomorrow  and labs not a lot worse- he could be D/Cd and we will attempt OP dialysis planning  2. HTN/vol- has responded exceedingly well to lasix IV- decreased dose- still responding.  His blood pressure is high enough so I think we would not have the same issues with pt that we have with others with chronically low BP- is on bidil  - stop lasix for now 3. Anemia- not to a critical level- will check iron stores tomorrow and replete if needed  4. Secondary hyperparathyroidism- phos is high with highish calc- start non calc contain binder.  Check PTH  5. Hyperkalemia- has corrected - for recheck today    Admir Candelas A    Labs: Basic Metabolic Panel: Recent Labs  Lab 01/21/18 1940 01/22/18 0502 01/23/18 0601  NA 131* 133* 135  K 5.8* 5.2* 4.5  CL 89* 92* 89*  CO2 21* 20* 28  GLUCOSE 93 55* 119*  BUN 162* 163* 170*  CREATININE 5.88* 6.00* 6.07*  CALCIUM 9.4 9.4 9.6  PHOS  --   --  6.7*   Liver Function Tests: Recent Labs  Lab 01/21/18 1940 01/22/18 0502 01/23/18 0601  AST 73* 66*  --   ALT 48  47  --   ALKPHOS 81 72  --   BILITOT 2.4* 1.8*  --   PROT 6.8 6.3*  --   ALBUMIN 3.2* 3.1* 3.3*   Recent Labs  Lab 01/22/18 0502  LIPASE 47   No results for input(s): AMMONIA in the last 168 hours. CBC: Recent Labs  Lab 01/21/18 1941 01/22/18 0502 01/23/18 0601  WBC 8.6 7.4 9.9  NEUTROABS 7.1 6.2 7.9*  HGB 10.9* 10.9* 10.6*  HCT 36.2* 34.4* 35.1*  MCV 68.2* 65.9* 67.8*  PLT 485* 368 444*   Cardiac Enzymes: Recent Labs  Lab 01/22/18 0502 01/22/18 1047 01/22/18 1641  TROPONINI 0.09* 0.10* 0.08*   CBG: Recent Labs  Lab 01/22/18 0053  GLUCAP 98    Iron Studies: No results for input(s): IRON, TIBC, TRANSFERRIN, FERRITIN in the last 72 hours. Studies/Results: Dg Chest 2 View  Result Date: 01/22/2018 CLINICAL DATA:  Pt complains of SOB x 1 week. Past week he had some nausea, vomiting and diarrhea. Hx CHF, HTN, COPD. EXAM: CHEST - 2 VIEW COMPARISON:  Chest x-rays  dated 01/21/2018, 11/29/2017 and 09/09/2017. FINDINGS: Grossly stable cardiomegaly. LEFT chest wall pacemaker/ICD hardware appears stable in position with intact lead. Persistent opacity at the RIGHT lung base, not significantly changed compared to yesterday's exam. Lungs otherwise clear. No pneumothorax or significant pleural effusions seen. Blunting of the costophrenic angles could represent small bilateral pleural effusions or chronic pleural thickening. No acute or suspicious osseous finding. IMPRESSION: 1. Persistent opacity at the RIGHT lung base, stable compared to yesterday's exam, new compared to earlier chest x-rays, again suspicious for pneumonia. 2. Stable cardiomegaly. Electronically Signed   By: Bary Richard M.D.   On: 01/22/2018 20:00   Medications: Infusions:   Scheduled Medications: . cholestyramine light  4 g Oral TID  . furosemide  40 mg Intravenous Q12H  . heparin  5,000 Units Subcutaneous Q8H  . isosorbide-hydrALAZINE  2 tablet Oral TID  . saccharomyces boulardii  250 mg Oral BID  . vancomycin  125 mg Oral QID    have reviewed scheduled and prn medications.  Physical Exam: General:this am preparing his pancakes - will not admit to any sxms that he admitted to yesterday  Heart: RRR Lungs: mostly clear Abdomen: slightly distended, non tender Extremities: no  Edema  Dialysis Access: not yet    01/24/2018,8:20 AM  LOS: 2 days

## 2018-01-25 DIAGNOSIS — Z95810 Presence of automatic (implantable) cardiac defibrillator: Secondary | ICD-10-CM

## 2018-01-25 DIAGNOSIS — Q613 Polycystic kidney, unspecified: Secondary | ICD-10-CM

## 2018-01-25 DIAGNOSIS — A0472 Enterocolitis due to Clostridium difficile, not specified as recurrent: Secondary | ICD-10-CM

## 2018-01-25 LAB — RENAL FUNCTION PANEL
Albumin: 2.7 g/dL — ABNORMAL LOW (ref 3.5–5.0)
Anion gap: 14 (ref 5–15)
BUN: 125 mg/dL — ABNORMAL HIGH (ref 6–20)
CALCIUM: 8.5 mg/dL — AB (ref 8.9–10.3)
CO2: 29 mmol/L (ref 22–32)
Chloride: 94 mmol/L — ABNORMAL LOW (ref 101–111)
Creatinine, Ser: 3.69 mg/dL — ABNORMAL HIGH (ref 0.61–1.24)
GFR calc non Af Amer: 16 mL/min — ABNORMAL LOW (ref >60)
GFR, EST AFRICAN AMERICAN: 19 mL/min — AB (ref >60)
GLUCOSE: 119 mg/dL — AB (ref 65–99)
Phosphorus: 2.4 mg/dL — ABNORMAL LOW (ref 2.5–4.6)
Potassium: 3.4 mmol/L — ABNORMAL LOW (ref 3.5–5.1)
SODIUM: 137 mmol/L (ref 135–145)

## 2018-01-25 LAB — BASIC METABOLIC PANEL
Anion gap: 14 (ref 5–15)
BUN: 123 mg/dL — AB (ref 6–20)
CHLORIDE: 94 mmol/L — AB (ref 101–111)
CO2: 30 mmol/L (ref 22–32)
CREATININE: 3.72 mg/dL — AB (ref 0.61–1.24)
Calcium: 8.5 mg/dL — ABNORMAL LOW (ref 8.9–10.3)
GFR calc non Af Amer: 16 mL/min — ABNORMAL LOW (ref >60)
GFR, EST AFRICAN AMERICAN: 19 mL/min — AB (ref >60)
Glucose, Bld: 119 mg/dL — ABNORMAL HIGH (ref 65–99)
Potassium: 3.3 mmol/L — ABNORMAL LOW (ref 3.5–5.1)
Sodium: 138 mmol/L (ref 135–145)

## 2018-01-25 LAB — IRON AND TIBC
IRON: 18 ug/dL — AB (ref 45–182)
Saturation Ratios: 4 % — ABNORMAL LOW (ref 17.9–39.5)
TIBC: 437 ug/dL (ref 250–450)
UIBC: 419 ug/dL

## 2018-01-25 MED ORDER — SODIUM CHLORIDE 0.9 % IV SOLN
510.0000 mg | Freq: Once | INTRAVENOUS | Status: AC
  Administered 2018-01-25: 510 mg via INTRAVENOUS
  Filled 2018-01-25: qty 17

## 2018-01-25 MED ORDER — VANCOMYCIN HCL 125 MG PO CAPS: 125.0000 mg | ORAL_CAPSULE | Freq: Four times a day (QID) | ORAL | 0 refills | Status: DC

## 2018-01-25 NOTE — Discharge Instructions (Signed)
Clostridium Difficile Infection   Clostridium difficile (C. difficile or C. diff) infection causes inflammation of the large intestine (colon). This condition can result in damage to the lining of your colon and may lead to another condition called colitis. This infection can be passed from person to person (is contagious).  Follow these instructions at home:  Eating and drinking   · Drink enough fluid to keep your pee (urine) clear or pale yellow.  · Avoid drinking:  ? Milk.  ? Caffeine.  ? Alcohol.  · Follow exact instructions from your doctor about how to get enough fluid in your body (rehydrate).  · Eat small meals often instead of large meals.  Medicines   · Take your antibiotic medicine as told by your doctor. Do not stop taking the antibiotic even if you start to feel better unless your doctor told you to do that.  · Take over-the-counter and prescription medicines only as told by your doctor.  · Do not use medicines to help with watery poop (diarrhea).  General instructions   · Wash your hands fully before you prepare food and after you use the bathroom. Make sure people who live with you also wash their  · hands often.  · Clean the surfaces that you touch. Use a product that contains chlorine bleach.  · Keep all follow-up visits as told by your doctor. This is important.  Contact a doctor if:  · Your symptoms do not get better with treatment.  · Your symptoms get worse with treatment.  · Your symptoms go away and then come back.  · You have a fever.  · You have new symptoms.  Get help right away if:  · You have more pain or tenderness in your belly (abdomen).  · Your poop (stool) is mostly bloody.  · Your poop looks dark black and tarry.  · You cannot eat or drink without throwing up (vomiting).  · You have signs of dehydration, such as:  ? Dark pee, very little pee, or no pee.  ? Cracked lips.  ? Not making tears when you cry.  ? Dry mouth.  ? Sunken eyes.  ? Feeling sleepy.  ? Feeling weak.  ? Feeling  dizzy.  This information is not intended to replace advice given to you by your health care provider. Make sure you discuss any questions you have with your health care provider.  Document Released: 06/23/2009 Document Revised: 02/01/2016 Document Reviewed: 02/27/2015  Elsevier Interactive Patient Education © 2017 [employer]

## 2018-01-25 NOTE — Discharge Summary (Signed)
Physician Discharge Summary  Kelly Leonard RUE:[id number] DOB: 08-25-1956 DOA: 01/21/2018  PCP: Corky Downs, MD  Admit date: 01/21/2018 Discharge date: 01/25/2018  Admitted From: Home Disposition: Home   Recommendations for Outpatient Follow-up:  1. Follow up with PCP in 1-2 weeks 2. Follow up with cardiology, Dr. Gala Romney 3. Follow up with nephrology, Dr. Kathrene Bongo 4. 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 He had been having worsening N/V/D and continued bumex and metolazone despite this. After HF clinic labs returned, he was told to go to the ED where he appeared volume overloaded with chest x-ray showing congestion versus infiltrates BNP was more than 4500 creatinine has worsened from last month of 3.7-5.8 with potassium of 5.8. EKG shows normal sinus rhythm with some tall T waves. Was given D50 and insulin for the hyperkalemia and ER physician had discussed with on-call nephrologist about the worsening renal function and CHF who advised to give Lasix 160 mg IV. Nephrology and cardiology continued to follow the patient who developed uremic symptoms with elevated BUN. Lasix dose was decreased and urine output continued to be brisk. BUN fell 170 > 125 and creatinine has improved near baseline at 3.69. The patient is not interested in beginning steps toward access for dialysis but will follow up with nephrology. Work up of the diarrhea showed C. diff, po vancomycin started with resolution of symptoms. This will be continued.   Discharge Diagnoses:  Principal Problem:   Acute on chronic systolic CHF (congestive heart failure) (HCC) Active Problems:    Hypertension   Polycystic kidney   ICD (implantable cardioverter-defibrillator) in place   Acute renal failure superimposed on stage 4 chronic kidney disease (HCC)   Gout  Acute renal failure on stage IV CKD: Possibly also progressive CKD in setting of polycystic kidney disease.  - Refusing dialysis planning, vascular access placement.  - No longer uremic and clear for discharge per nephrology. Will restart home bumex alone at discharge pending further follow up.   Acute on chronic HFrEF, HTN: s/p ICD, due to NICM. Volume status significantly improved. Wt 141lbs > 123lbs.  - Continue bumex, bidil - Follow up with cardiology, Dr. Gala Romney   C. difficile colitis: Uncomplicated, symptoms resolved, afebrile without leukocytosis.  - Continue po vancomycin x10 days (started 5/17). No fulminant disease to suggest that systemic absorption would be an issue.   Hyperkalemia: Resolved.  Anemia of chronic disease:  - Given single dose of feraheme per nephrology while admitted.  - Monitor at follow up.   Discharge Instructions Discharge Instructions    (HEART FAILURE PATIENTS) Call MD:  Anytime you have any of the following symptoms: 1) 3 pound weight gain in 24 hours or 5 pounds in 1 week 2) shortness of breath, with or without a dry hacking cough 3) swelling in the hands, feet or stomach 4) if you have to sleep on extra pillows at night in order to breathe.   Complete by:  As directed    Call MD for:  severe uncontrolled pain   Complete by:  As directed    Call MD for:  temperature >100.4   Complete by:  As directed    Diet - low sodium heart healthy  Complete by:  As directed    Discharge instructions   Complete by:  As directed    You were admitted with kidney failure and heart failure exacerbation. You also had diarrhea due to a colon infection called C. diff. Fortunately you have improved significantly and can be discharged with the following recommendations:  - Go back to taking  bumex as you were. Do not take metolazone of potassium until you follow up with your heart doctor or kidney doctor.  - To treat the c. diff colitis, take vancomycin capsules 4 times daily. You have 8 more days of treatment left. You and everyone around you should wash their hands frequently.  - Follow up with Dr. Gala Romney and/or the cardiology clini in the next 1-2 weeks. Call to schedule that appointment tomorrow.  - If you do not heart from [facility], Dr. Daiva Nakayama office, call them as well for follow up. You need repeat labs in the next week with one of them or your primary doctor.  - If your symptoms return/worsen, seek medical attention right away.   Increase activity slowly   Complete by:  As directed      Allergies as of 01/25/2018      Reactions   Entresto [sacubitril-valsartan] Swelling   Swelling of the face   Penicillins Anaphylaxis, Swelling   Has patient had a PCN reaction causing immediate rash, facial/tongue/throat swelling, SOB or lightheadedness with hypotension: Yes Has patient had a PCN reaction causing severe rash involving mucus membranes or skin necrosis: No Has patient had a PCN reaction that required hospitalization: No Has patient had a PCN reaction occurring within the last 10 years: No If all of the above answers are "NO", then may proceed with Cephalosporin use.   Seroquel [quetiapine] Nausea And Vomiting, Hives   Hives   Ace Inhibitors Rash   Corlanor [ivabradine] Swelling   Facial swelling   Ambien [zolpidem Tartrate] Swelling   Carvedilol Other (See Comments)   "dizziness, light headed, and nausea"   Hydroxyzine Nausea Only   Prednisone Other (See Comments)   Causes fluid build up   Sertraline Diarrhea   itching   Trazodone And Nefazodone Swelling, Other (See Comments)   "up for days" and lip swelling   Uloric [febuxostat] Other (See Comments)   Jittery, jerky, makes him lose his mind   Benadryl [diphenhydramine Hcl] Other (See Comments)    "makes me crazy and hyper"   Gabapentin Swelling   Lip and face swelling   Lorazepam Rash   Rash and severe anxiety   Torsemide Hives, Itching, Nausea And Vomiting, Rash      Medication List    STOP taking these medications   Potassium Chloride ER 20 MEQ Tbcr     TAKE these medications   ALPRAZolam 1 MG tablet Commonly known as:  XANAX Take 1 tablet (1 mg total) by mouth 3 (three) times daily as needed for up to 3 doses for sleep.   B COMPLEX PO Take 1 tablet by mouth daily.   benztropine 1 MG tablet Commonly known as:  COGENTIN Take 1 tablet (1 mg total) by mouth daily for 3 days. What changed:    when to take this  additional instructions   bumetanide 1 MG tablet Commonly known as:  BUMEX Take 3 tablets (3 mg total) daily by mouth. May take additional 3 mg in the PM as needed for swelling/SOB   busPIRone 5 MG tablet Commonly known as:  BUSPAR Take 1 tablet (5 mg  total) 3 (three) times daily by mouth.   chlordiazePOXIDE 10 MG capsule Commonly known as:  LIBRIUM Take 1 capsule (10 mg total) by mouth 4 (four) times daily as needed (agitation). What changed:  when to take this   colchicine 0.6 MG tablet Take 0.6 mg by mouth 2 (two) times daily as needed. Gout flare up   Glecaprevir-Pibrentasvir 100-40 MG Tabs Commonly known as:  MAVYRET Take 3 tablets by mouth daily with breakfast.   isosorbide-hydrALAZINE 20-37.5 MG tablet Commonly known as:  BIDIL Take 2 tablets 3 (three) times daily by mouth.   metolazone 2.5 MG tablet Commonly known as:  ZAROXOLYN Take 1 tablet (2.5 mg total) by mouth as directed. For weight of 140 lbs or greater What changed:    when to take this  reasons to take this  additional instructions  Another medication with the same name was removed. Continue taking this medication, and follow the directions you see here.   promethazine 25 MG tablet Commonly known as:  PHENERGAN Take 25 mg by mouth every 6 (six) hours as needed for  nausea or vomiting.   vancomycin 125 MG capsule Commonly known as:  VANCOCIN Take 1 capsule (125 mg total) by mouth 4 (four) times daily.   Vitamin D3 1000 units Caps Take 1,000 Units by mouth daily.      Follow-up Information    Corky Downs, MD Follow up.   Specialty:  Internal Medicine Contact information: [address] [place] [address] [phone]        Bensimhon, Bevelyn Buckles, MD. Schedule an appointment as soon as possible for a visit in 1 week(s).   Specialty:  Cardiology Contact information: [address] [place] [address] [phone]        Annie Sable, MD. Schedule an appointment as soon as possible for a visit.   Specialty:  Nephrology Why:  Call if you do not hear from them.  Contact information: [address] [place] [address] [phone]          Allergies  Allergen Reactions  . Entresto [Sacubitril-Valsartan] Swelling    Swelling of the face  . Penicillins Anaphylaxis and Swelling    Has patient had a PCN reaction causing immediate rash, facial/tongue/throat swelling, SOB or lightheadedness with hypotension: Yes Has patient had a PCN reaction causing severe rash involving mucus membranes or skin necrosis: No Has patient had a PCN reaction that required hospitalization: No Has patient had a PCN reaction occurring within the last 10 years: No If all of the above answers are "NO", then may proceed with Cephalosporin use.   . Seroquel [Quetiapine] Nausea And Vomiting and Hives    Hives   . Ace Inhibitors Rash  . Corlanor [Ivabradine] Swelling    Facial swelling  . Ambien [Zolpidem Tartrate] Swelling  . Carvedilol Other (See Comments)    "dizziness, light headed, and nausea"  . Hydroxyzine Nausea Only  . Prednisone Other (See Comments)    Causes fluid build up  . Sertraline Diarrhea    itching  . Trazodone And Nefazodone Swelling and Other (See Comments)    "up for days" and lip swelling  . Uloric  [Febuxostat] Other (See Comments)    Jittery, jerky, makes him lose his mind  . Benadryl [Diphenhydramine Hcl] Other (See Comments)    "makes me crazy and hyper"  . Gabapentin Swelling    Lip and face swelling  . Lorazepam Rash    Rash and severe anxiety  .  Torsemide Hives, Itching, Nausea And Vomiting and Rash    Consultations:  Cardiology  Nephrology  Procedures/Studies: Ct Abdomen Pelvis Wo Contrast  Result Date: 01/22/2018 CLINICAL DATA:  Acute onset of nausea and vomiting. EXAM: CT ABDOMEN AND PELVIS WITHOUT CONTRAST TECHNIQUE: Multidetector CT imaging of the abdomen and pelvis was performed following the standard protocol without IV contrast. COMPARISON:  Renal ultrasound performed 07/08/2017 FINDINGS: Lower chest: Moderate right and small left pleural effusions are noted, with mild atelectasis at the lung bases. The heart is mildly enlarged. An AICD lead is noted. Hepatobiliary: Scattered nonspecific hypodensities are seen within the liver. The liver is otherwise grossly unremarkable. The gallbladder is not well assessed given surrounding ascites. The common bile duct remains normal in caliber. Pancreas: The pancreas is within normal limits. Spleen: The spleen is unremarkable in appearance. Adrenals/Urinary Tract: The adrenal glands are grossly unremarkable in appearance. Innumerable cysts are seen throughout both kidneys. Multiple heterogeneous masses are seen arising from both kidneys, the largest of which measures 3.9 cm at the upper pole of the left kidney. Malignancy cannot be excluded. There is no evidence of hydronephrosis. No renal or ureteral stones are identified. Scattered parenchymal calcifications are noted bilaterally. Stomach/Bowel: The stomach is unremarkable in appearance. The small bowel is within normal limits. The appendix is normal in caliber, without evidence of appendicitis. The colon is unremarkable in appearance. Vascular/Lymphatic: Diffuse calcification is seen  along the abdominal aorta and its branches. The abdominal aorta is otherwise grossly unremarkable. The inferior vena cava is grossly unremarkable. No retroperitoneal lymphadenopathy is seen. No pelvic sidewall lymphadenopathy is identified. Reproductive: The bladder is mildly distended and grossly unremarkable. The prostate remains normal in size. Other: Moderate volume ascites is seen within the abdomen and pelvis. Musculoskeletal: No acute osseous abnormalities are identified. The visualized musculature is unremarkable in appearance. IMPRESSION: 1. No definite acute abnormality seen to explain the patient's symptoms. 2. Moderate right and small left pleural effusions, with mild atelectasis at the lung bases. 3. Multiple heterogeneous masses arising from both kidneys, the largest of which measures 3.9 cm at the upper pole of the left kidney. Malignancy cannot be excluded. These could be further assessed on MRI of the abdomen with and without contrast, as deemed clinically appropriate. 4. Moderate volume ascites within the abdomen and pelvis. 5. Scattered nonspecific hypodensities within the liver. 6. Mild cardiomegaly. Aortic Atherosclerosis (ICD10-I70.0). Electronically Signed   By: Roanna Raider M.D.   On: 01/22/2018 03:57   Dg Chest 2 View  Result Date: 01/22/2018 CLINICAL DATA:  Pt complains of SOB x 1 week. Past week he had some nausea, vomiting and diarrhea. Hx CHF, HTN, COPD. EXAM: CHEST - 2 VIEW COMPARISON:  Chest x-rays dated 01/21/2018, 11/29/2017 and 09/09/2017. FINDINGS: Grossly stable cardiomegaly. LEFT chest wall pacemaker/ICD hardware appears stable in position with intact lead. Persistent opacity at the RIGHT lung base, not significantly changed compared to yesterday's exam. Lungs otherwise clear. No pneumothorax or significant pleural effusions seen. Blunting of the costophrenic angles could represent small bilateral pleural effusions or chronic pleural thickening. No acute or suspicious  osseous finding. IMPRESSION: 1. Persistent opacity at the RIGHT lung base, stable compared to yesterday's exam, new compared to earlier chest x-rays, again suspicious for pneumonia. 2. Stable cardiomegaly. Electronically Signed   By: Bary Richard M.D.   On: 01/22/2018 20:00   Dg Chest 2 View  Result Date: 01/21/2018 CLINICAL DATA:  Abdominal and lower extremity edema. Abnormal labs. Shortness of breath. EXAM: CHEST - 2 VIEW  COMPARISON:  Chest x-rays dated 11/29/2017 and 10/03/2017 FINDINGS: Stable cardiomegaly. LEFT chest wall ICD apparatus appears stable in position with intact lead. Subtle new opacity within the RIGHT lower lobe, suspicious for pneumonia, alternatively atelectasis or asymmetric edema. LEFT lung is clear. No acute or suspicious osseous finding. IMPRESSION: New RIGHT lower lobe opacity, suspicious for pneumonia, alternatively atelectasis or asymmetric edema. Recommend follow-up chest x-ray to ensure resolution. Stable cardiomegaly. Electronically Signed   By: Bary Richard M.D.   On: 01/21/2018 21:15   Subjective: Feels better. No N/V/D/, eating well, no abdominal pain, chest pain, dyspnea. Denies itching. Wife says he is at his mental and physical baseline. Swelling resolved, urinating well.   Discharge Exam: Vitals:   01/25/18 1158 01/25/18 1255  BP: 116/79 105/68  Pulse: 96 98  Resp:  18  Temp:  98.4 F (36.9 C)  SpO2:  100%   General: Thin 62yo male in no distress Cardiovascular: RRR, no murmur, gallop. No lower extremity edema Respiratory: Nonlabored and clear Abdominal: Soft, not tender at all, slightly distended. +BS.   Labs: BNP (last 3 results) Recent Labs    10/03/17 0111 11/26/17 0610 01/21/18 1940  BNP 1,644.0* 2,282.0* >4,500.0*   Basic Metabolic Panel: Recent Labs  Lab 01/21/18 1940 01/22/18 0502 01/23/18 0601 01/24/18 0828 01/25/18 0620  NA 131* 133* 135 135 138  137  K 5.8* 5.2* 4.5 3.5 3.3*  3.4*  CL 89* 92* 89* 91* 94*  94*  CO2 21*  20* 28 28 30  29   GLUCOSE 93 55* 119* 118* 119*  119*  BUN 162* 163* 170* 148* 123*  125*  CREATININE 5.88* 6.00* 6.07* 4.60* 3.72*  3.69*  CALCIUM 9.4 9.4 9.6 9.0 8.5*  8.5*  PHOS  --   --  6.7* 4.7* 2.4*   Liver Function Tests: Recent Labs  Lab 01/21/18 1940 01/22/18 0502 01/23/18 0601 01/24/18 0828 01/25/18 0620  AST 73* 66*  --   --   --   ALT 48 47  --   --   --   ALKPHOS 81 72  --   --   --   BILITOT 2.4* 1.8*  --   --   --   PROT 6.8 6.3*  --   --   --   ALBUMIN 3.2* 3.1* 3.3* 3.1* 2.7*   Recent Labs  Lab 01/22/18 0502  LIPASE 47   No results for input(s): AMMONIA in the last 168 hours. CBC: Recent Labs  Lab 01/21/18 1941 01/22/18 0502 01/23/18 0601  WBC 8.6 7.4 9.9  NEUTROABS 7.1 6.2 7.9*  HGB 10.9* 10.9* 10.6*  HCT 36.2* 34.4* 35.1*  MCV 68.2* 65.9* 67.8*  PLT 485* 368 444*   Cardiac Enzymes: Recent Labs  Lab 01/22/18 0502 01/22/18 1047 01/22/18 1641  TROPONINI 0.09* 0.10* 0.08*   BNP: Invalid input(s): POCBNP CBG: Recent Labs  Lab 01/22/18 0053  GLUCAP 98   D-Dimer No results for input(s): DDIMER in the last 72 hours. Hgb A1c No results for input(s): HGBA1C in the last 72 hours. Lipid Profile No results for input(s): CHOL, HDL, LDLCALC, TRIG, CHOLHDL, LDLDIRECT in the last 72 hours. Thyroid function studies No results for input(s): TSH, T4TOTAL, T3FREE, THYROIDAB in the last 72 hours.  Invalid input(s): FREET3 Anemia work up Recent Labs    01/25/18 0620  TIBC 437  IRON 18*   Urinalysis    Component Value Date/Time   COLORURINE YELLOW 01/21/2018 1934   APPEARANCEUR HAZY (A) 01/21/2018 1934  LABSPEC 1.011 01/21/2018 1934   PHURINE 6.0 01/21/2018 1934   GLUCOSEU NEGATIVE 01/21/2018 1934   HGBUR NEGATIVE 01/21/2018 1934   BILIRUBINUR NEGATIVE 01/21/2018 1934   KETONESUR NEGATIVE 01/21/2018 1934   PROTEINUR 100 (A) 01/21/2018 1934   NITRITE NEGATIVE 01/21/2018 1934   LEUKOCYTESUR NEGATIVE 01/21/2018 1934     Microbiology Recent Results (from the past 240 hour(s))  C difficile quick scan w PCR reflex     Status: Abnormal   Collection Time: 01/22/18  2:48 PM  Result Value Ref Range Status   C Diff antigen POSITIVE (A) NEGATIVE Final   C Diff toxin POSITIVE (A) NEGATIVE Final   C Diff interpretation   Final    CRITICAL RESULT CALLED TO, READ BACK BY AND VERIFIED WITH:    Comment: R.TURGOTT,RN AT 2000 BY L.PITT  01/22/18  MRSA PCR Screening     Status: None   Collection Time: 01/22/18  2:50 PM  Result Value Ref Range Status   MRSA by PCR NEGATIVE NEGATIVE Final    Comment:        The GeneXpert MRSA Assay (FDA approved for NASAL specimens only), is one component of a comprehensive MRSA colonization surveillance program. It is not intended to diagnose MRSA infection nor to guide or monitor treatment for MRSA infections. Performed at [facility], [address] [place], [place] [address]     Time coordinating discharge: Approximately 40 minutes  Tyrone Nine, MD  [facility] 01/25/2018, 12:58 PM Pager [phone]

## 2018-01-25 NOTE — Progress Notes (Signed)
Subjective:  2700 of UOP- says swelling is gone and wants to go home- wife here- she is wanting to wait as far as initiating dialysis-not this admit- sounds like is not actually seeing a kidney MD on the outside at this point. Had good uop still even with stopping lasix- now with c diff   Objective Vital signs in last 24 hours: Vitals:   01/24/18 2059 01/24/18 2121 01/25/18 0423 01/25/18 0757  BP: 118/71  119/71 (!) 127/93  Pulse: 91  98 99  Resp: 15  14   Temp: 98.3 F (36.8 C)  98.5 F (36.9 C) (!) 97.3 F (36.3 C)  TempSrc: Oral  Oral Oral  SpO2: 99%  100% 100%  Weight:   56 kg (123 lb 8 oz)   Height:  5\' 9"  (1.753 m)     Weight change: -1.27 kg (-2 lb 12.8 oz)  Intake/Output Summary (Last 24 hours) at 01/25/2018 0959 Last data filed at 01/25/2018 0426 Gross per 24 hour  Intake 942 ml  Output 1625 ml  Net -683 ml    Assessment/ Plan: Pt is a 62 y.o. 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 Renal- appears to me like progressive CKD- GFR was in the single digits and I felt was having uremic sxms but now better.  I talked to him about the fact that his BUN was very high and I felt he was having uremic sxms and my recommendation would be to initiate dialysis.  He says he is scared to death, his nephrologist told him he did not need it yet ([place]) but sounds like not seeing regularly.  I talked to his wife who also wanted to hold off on even HD planning.  Luckily it does seem to have improved but still not great.  Is refusing dialysis planning/ AVF/AVG placement.   Is not uremic and labs are improved - he could be D/Cd and we will attempt OP dialysis planning- he wants to follow up with me  2. HTN/vol- responded exceedingly well to lasix IV- decreased dose- still responding.  His blood  pressure is high enough so I think we would not have the same issues with pt that we have with others with chronically low BP- is on bidil  - stop lasix for now in house but will need to resume his diuretics at discharge- at least the bumex 3. Anemia- not to a critical level-  iron stores low will give one dose feraheme 4. Secondary hyperparathyroidism- phos was high now improved, no binder.  Check PTH - pending- can follow as OP 5. Hyperkalemia- has corrected   I am OK with discharge of pt at any time.  I will arrange follow up with me.  Renal will sign off   Ji Fairburn A    Labs: Basic Metabolic Panel: Recent Labs  Lab 01/23/18 0601 01/24/18 0828 01/25/18 0620  NA 135 135 138  137  K 4.5 3.5 3.3*  3.4*  CL 89* 91* 94*  94*  CO2 28 28 30  29   GLUCOSE 119* 118* 119*  119*  BUN 170* 148* 123*  125*  CREATININE 6.07* 4.60* 3.72*  3.69*  CALCIUM 9.6 9.0 8.5*  8.5*  PHOS 6.7* 4.7* 2.4*   Liver Function Tests: Recent Labs  Lab 01/21/18 1940 01/22/18  0502 01/23/18 0601 01/24/18 0828 01/25/18 0620  AST 73* 66*  --   --   --   ALT 48 47  --   --   --   ALKPHOS 81 72  --   --   --   BILITOT 2.4* 1.8*  --   --   --   PROT 6.8 6.3*  --   --   --   ALBUMIN 3.2* 3.1* 3.3* 3.1* 2.7*   Recent Labs  Lab 01/22/18 0502  LIPASE 47   No results for input(s): AMMONIA in the last 168 hours. CBC: Recent Labs  Lab 01/21/18 1941 01/22/18 0502 01/23/18 0601  WBC 8.6 7.4 9.9  NEUTROABS 7.1 6.2 7.9*  HGB 10.9* 10.9* 10.6*  HCT 36.2* 34.4* 35.1*  MCV 68.2* 65.9* 67.8*  PLT 485* 368 444*   Cardiac Enzymes: Recent Labs  Lab 01/22/18 0502 01/22/18 1047 01/22/18 1641  TROPONINI 0.09* 0.10* 0.08*   CBG: Recent Labs  Lab 01/22/18 0053  GLUCAP 98    Iron Studies:  Recent Labs    01/25/18 0620  IRON 18*  TIBC 437   Studies/Results: No results found. Medications: Infusions:   Scheduled Medications: . cholestyramine light  4 g Oral TID  . heparin   5,000 Units Subcutaneous Q8H  . isosorbide-hydrALAZINE  2 tablet Oral TID  . saccharomyces boulardii  250 mg Oral BID  . vancomycin  125 mg Oral QID    have reviewed scheduled and prn medications.  Physical Exam: General:looks good  Heart: RRR Lungs: mostly clear Abdomen: slightly distended, non tender Extremities: no  Edema  Dialysis Access: not yet    01/25/2018,9:59 AM  LOS: 3 days

## 2018-01-26 LAB — PARATHYROID HORMONE, INTACT (NO CA): PTH: 84 pg/mL — ABNORMAL HIGH (ref 15–65)

## 2018-01-28 ENCOUNTER — Other Ambulatory Visit: Payer: Self-pay | Admitting: Unknown Physician Specialty

## 2018-01-29 NOTE — Telephone Encounter (Signed)
Should come through heart failure clinic

## 2018-01-29 NOTE — Telephone Encounter (Signed)
Bumex LOV: 06/02/17 Last Refill: 07/12/17 PCP: Gabriel Cirri Pharmacy: [facility]  Pt seen at [facility] 11/19/17; is Elnita Maxwell still managing this medication

## 2018-01-29 NOTE — Telephone Encounter (Signed)
Bumex 1 mg refill request  LOV 08/08/17 with Gabriel Cirri  Wasn't sure if Elnita Maxwell was managing this medication or his cardiologist.  [facility] [address] [place], [place] - [address]

## 2018-02-03 ENCOUNTER — Emergency Department: Payer: BLUE CROSS/BLUE SHIELD

## 2018-02-03 ENCOUNTER — Emergency Department
Admission: EM | Admit: 2018-02-03 | Discharge: 2018-02-04 | Disposition: A | Discharge status: Home / Self Care | Payer: BLUE CROSS/BLUE SHIELD | Attending: Emergency Medicine | Admitting: Emergency Medicine

## 2018-02-03 ENCOUNTER — Other Ambulatory Visit (HOSPITAL_COMMUNITY): Payer: Self-pay | Admitting: *Deleted

## 2018-02-03 ENCOUNTER — Encounter: Payer: Self-pay | Admitting: Emergency Medicine

## 2018-02-03 ENCOUNTER — Other Ambulatory Visit: Payer: Self-pay

## 2018-02-03 DIAGNOSIS — J449 Chronic obstructive pulmonary disease, unspecified: Secondary | ICD-10-CM | POA: Diagnosis not present

## 2018-02-03 DIAGNOSIS — N184 Chronic kidney disease, stage 4 (severe): Secondary | ICD-10-CM | POA: Diagnosis not present

## 2018-02-03 DIAGNOSIS — Z79899 Other long term (current) drug therapy: Secondary | ICD-10-CM | POA: Diagnosis not present

## 2018-02-03 DIAGNOSIS — R2243 Localized swelling, mass and lump, lower limb, bilateral: Secondary | ICD-10-CM | POA: Diagnosis present

## 2018-02-03 DIAGNOSIS — I5022 Chronic systolic (congestive) heart failure: Secondary | ICD-10-CM | POA: Insufficient documentation

## 2018-02-03 DIAGNOSIS — R0602 Shortness of breath: Secondary | ICD-10-CM | POA: Diagnosis not present

## 2018-02-03 DIAGNOSIS — R609 Edema, unspecified: Secondary | ICD-10-CM

## 2018-02-03 DIAGNOSIS — I509 Heart failure, unspecified: Secondary | ICD-10-CM

## 2018-02-03 DIAGNOSIS — I130 Hypertensive heart and chronic kidney disease with heart failure and stage 1 through stage 4 chronic kidney disease, or unspecified chronic kidney disease: Secondary | ICD-10-CM | POA: Diagnosis not present

## 2018-02-03 LAB — CBC
HEMATOCRIT: 33.7 % — AB (ref 40.0–52.0)
HEMOGLOBIN: 10.7 g/dL — AB (ref 13.0–18.0)
MCH: 22.3 pg — ABNORMAL LOW (ref 26.0–34.0)
MCHC: 31.8 g/dL — ABNORMAL LOW (ref 32.0–36.0)
MCV: 70.2 fL — ABNORMAL LOW (ref 80.0–100.0)
Platelets: 191 10*3/uL (ref 150–440)
RBC: 4.8 MIL/uL (ref 4.40–5.90)
RDW: 19.7 % — ABNORMAL HIGH (ref 11.5–14.5)
WBC: 6.2 10*3/uL (ref 3.8–10.6)

## 2018-02-03 LAB — BRAIN NATRIURETIC PEPTIDE: B NATRIURETIC PEPTIDE 5: 3160 pg/mL — AB (ref 0.0–100.0)

## 2018-02-03 LAB — BASIC METABOLIC PANEL
ANION GAP: 14 (ref 5–15)
BUN: 101 mg/dL — ABNORMAL HIGH (ref 6–20)
CO2: 26 mmol/L (ref 22–32)
Calcium: 8.8 mg/dL — ABNORMAL LOW (ref 8.9–10.3)
Chloride: 97 mmol/L — ABNORMAL LOW (ref 101–111)
Creatinine, Ser: 3.61 mg/dL — ABNORMAL HIGH (ref 0.61–1.24)
GFR calc Af Amer: 19 mL/min — ABNORMAL LOW (ref >60)
GFR, EST NON AFRICAN AMERICAN: 17 mL/min — AB (ref >60)
GLUCOSE: 117 mg/dL — AB (ref 65–99)
POTASSIUM: 4.5 mmol/L (ref 3.5–5.1)
SODIUM: 137 mmol/L (ref 135–145)

## 2018-02-03 LAB — TROPONIN I: Troponin I: 0.05 ng/mL (ref <0.03)

## 2018-02-03 MED ORDER — FUROSEMIDE 10 MG/ML IJ SOLN
80.0000 mg | Freq: Once | INTRAMUSCULAR | Status: AC
Start: 2018-02-03 — End: 2018-02-03
  Administered 2018-02-03: 80 mg via INTRAVENOUS
  Filled 2018-02-03: qty 8

## 2018-02-03 MED ORDER — BUMETANIDE 1 MG PO TABS: 3.0000 mg | ORAL_TABLET | Freq: Every day | ORAL | 3 refills | Status: AC

## 2018-02-03 NOTE — ED Triage Notes (Signed)
Patient to ER for scrotum and penis swelling, as well as mild shortness of breath. States he was just released on Sunday from [facility] for sepsis and swelling.

## 2018-02-03 NOTE — ED Notes (Signed)
Pt voided lg amt and feels much better.

## 2018-02-03 NOTE — ED Provider Notes (Signed)
[facility] Emergency Department Provider Note ____________________________________________   First MD Initiated Contact with Patient 02/03/18 2159     (approximate)  I have reviewed the triage vital signs and the nursing notes.   HISTORY  Chief Complaint Groin Swelling and Shortness of Breath    HPI Kelly Leonard is a 62 y.o. male with PMH as noted below who presents primarily with scrotum and lower abdominal swelling, as well as bilateral lower extremity swelling and some mild shortness of breath.  The patient was admitted for renal insufficiency and fluid overload earlier this month at [facility], and states he has been doing well since he was discharged over the last 1 to 2 days he developed the above symptoms.  He states that he became scared that he might be having the same situation as during his last admission so came to the hospital.  The patient states that he is not chronically on Lasix due to prior low potassium and kidney problems, although he has been treated with Lasix intermittently including during last hospitalization.  Past Medical History:  Diagnosis Date  . Anxiety   . CHF (congestive heart failure) (HCC)   . Chronic systolic congestive heart failure (HCC)   . CKD (chronic kidney disease) stage 4, GFR 15-29 ml/min (HCC)   . COPD (chronic obstructive pulmonary disease) (HCC)   . Dilated cardiomyopathy (HCC)   . Hyperlipidemia   . Hypertension   . MI (myocardial infarction) (HCC)   . Polycystic kidney   . Renal disorder     Patient Active Problem List   Diagnosis Date Noted  . Acute on chronic systolic CHF (congestive heart failure) (HCC) 01/22/2018  . Gout 09/19/2017  . Rash 09/16/2017  . Acute renal failure superimposed on stage 4 chronic kidney disease (HCC)   . Hyponatremia 07/05/2017  . Cramps, muscle, general 07/05/2017  . Weakness generalized 07/05/2017  . Insomnia 05/05/2017  . Advanced care planning/counseling  discussion 03/24/2017  . 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 Hypertension   . Hyperlipidemia   . Chronic systolic congestive heart failure (HCC)   . Dilated cardiomyopathy (HCC)   . Polycystic kidney   . CKD (chronic kidney disease) stage 4, GFR 15-29 ml/min (HCC)   . 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 Anxiety disorder, unspecified 03/03/2013    Past Surgical History:  Procedure Laterality Date  . CARDIAC CATHETERIZATION    . IMPLANTABLE CARDIOVERTER DEFIBRILLATOR (ICD) GENERATOR CHANGE Right 06/23/2015   Procedure: ICD GENERATOR  INITIAL IMPLANT;  Surgeon: Sharion Settler, MD;  Location: [facility] ORS;  Service: Cardiovascular;  Laterality: Right;    Prior to Admission medications   Medication Sig Start Date End Date Taking? Authorizing Provider  B Complex Vitamins (B COMPLEX PO) Take 1 tablet by mouth daily.   Yes [provider]  benztropine (COGENTIN) 1 MG tablet Take 1 tablet (1 mg total) by mouth daily for 3 days. Patient taking differently: Take 1 mg by mouth every 3 (three) days. AS NEEDED 12/31/17 02/03/18 Yes Sharman Cheek, MD  bumetanide (BUMEX) 1 MG tablet Take 3 tablets (3 mg total) by mouth daily. May take additional 3 mg in the PM as needed for swelling/SOB 02/03/18  Yes Bensimhon, Bevelyn Buckles, MD  chlordiazePOXIDE (LIBRIUM) 10 MG capsule Take 1 capsule (10 mg total) by mouth 4 (four) times daily as needed (agitation). Patient taking differently: Take 10 mg by mouth 2 (two) times daily.  12/31/17  Yes Sharman Cheek, MD  Cholecalciferol (VITAMIN D3) 1000 units CAPS Take 1,000 Units by mouth daily.   Yes [provider]  colchicine 0.6 MG tablet Take 0.6 mg by mouth 2 (two) times daily as needed. Gout flare up 01/09/18  Yes [provider]  isosorbide-hydrALAZINE (BIDIL) 20-37.5 MG tablet Take 2 tablets 3 (three) times daily by mouth.   Yes [provider]  promethazine (PHENERGAN) 25 MG tablet  Take 25 mg by mouth every 6 (six) hours as needed for nausea or vomiting.   Yes [provider]  ALPRAZolam Prudy Feeler) 1 MG tablet Take 1 tablet (1 mg total) by mouth 3 (three) times daily as needed for up to 3 doses for sleep. Patient not taking: Reported on 01/21/2018 11/26/17   Darci Current, MD  busPIRone (BUSPAR) 5 MG tablet Take 1 tablet (5 mg total) 3 (three) times daily by mouth. Patient not taking: Reported on 01/21/2018 07/15/17   Graciella Freer, PA-C  Glecaprevir-Pibrentasvir (MAVYRET) 100-40 MG TABS Take 3 tablets by mouth daily with breakfast. Patient not taking: Reported on 01/21/2018 08/27/17   Kuppelweiser, Cassie L, RPH-CPP  metolazone (ZAROXOLYN) 2.5 MG tablet Take 1 tablet (2.5 mg total) by mouth as directed. For weight of 140 lbs or greater Patient not taking: Reported on 02/03/2018 11/19/17 02/17/18  Bensimhon, Bevelyn Buckles, MD  vancomycin (VANCOCIN) 125 MG capsule Take 1 capsule (125 mg total) by mouth 4 (four) times daily. Patient not taking: Reported on 02/03/2018 01/25/18   Tyrone Nine, MD    Allergies Entresto [sacubitril-valsartan]; Penicillins; Seroquel [quetiapine]; Ace inhibitors; Corlanor [ivabradine]; Ambien [zolpidem tartrate]; Carvedilol; Hydroxyzine; Prednisone; Sertraline; Trazodone and nefazodone; Uloric [febuxostat]; Benadryl [diphenhydramine hcl]; Gabapentin; Lorazepam; and Torsemide  Family History  Problem Relation Age of Onset  . Diabetes Mother   . Hypertension Brother   . Heart disease Maternal Grandmother   . Heart attack Maternal Grandmother     Social History Social History   Tobacco Use  . Smoking status: Never Smoker  . Smokeless tobacco: Never Used  Substance Use Topics  . Alcohol use: No  . Drug use: No    Review of Systems  Constitutional: No fever. Eyes: No redness. ENT: No sore throat. Cardiovascular: Denies chest pain. Respiratory: Positive for mild shortness of breath. Gastrointestinal: No  vomiting Genitourinary: Negative for dysuria or testicular pain.  Positive for scrotal swelling. Musculoskeletal: Negative for back pain. Skin: Negative for rash. Neurological: Negative for headache.   ____________________________________________   PHYSICAL EXAM:  VITAL SIGNS: ED Triage Vitals  Enc Vitals Group     BP 02/03/18 1948 120/87     Pulse Rate 02/03/18 1948 95     Resp 02/03/18 1948 20     Temp 02/03/18 1948 98.1 F (36.7 C)     Temp Source 02/03/18 1948 Oral     SpO2 02/03/18 1948 99 %     Weight 02/03/18 1949 123 lb (55.8 kg)     Height --      Head Circumference --      Peak Flow --      Pain Score 02/03/18 1948 0     Pain Loc --      Pain Edu? --      Excl. in GC? --     Constitutional: Alert and oriented.  Relatively well appearing and in no acute distress. Eyes: Conjunctivae are normal.  Head: Atraumatic. Nose: No congestion/rhinnorhea. Mouth/Throat: Mucous membranes are moist.   Neck: Normal range of motion.  Cardiovascular: Normal rate, regular rhythm. Grossly  normal heart sounds.  Good peripheral circulation. Respiratory: Normal respiratory effort.  No retractions. Lungs CTAB. Gastrointestinal: Soft and nontender. No distention.  Genitourinary: Moderate scrotal edema, with no erythema, warmth, or tenderness. Musculoskeletal: 3+ bilateral lower extremity edema.  Extremities warm and well perfused.  Neurologic:  Normal speech and language. No gross focal neurologic deficits are appreciated.  Skin:  Skin is warm and dry. No rash noted. Psychiatric: Mood and affect are normal. Speech and behavior are normal.  ____________________________________________   LABS (all labs ordered are listed, but only abnormal results are displayed)  Labs Reviewed  BASIC METABOLIC PANEL - Abnormal; Notable for the following components:      Result Value   Chloride 97 (*)    Glucose, Bld 117 (*)    BUN 101 (*)    Creatinine, Ser 3.61 (*)    Calcium 8.8 (*)    GFR  calc non Af Amer 17 (*)    GFR calc Af Amer 19 (*)    All other components within normal limits  CBC - Abnormal; Notable for the following components:   Hemoglobin 10.7 (*)    HCT 33.7 (*)    MCV 70.2 (*)    MCH 22.3 (*)    MCHC 31.8 (*)    RDW 19.7 (*)    All other components within normal limits  TROPONIN I - Abnormal; Notable for the following components:   Troponin I 0.05 (*)    All other components within normal limits  BRAIN NATRIURETIC PEPTIDE - Abnormal; Notable for the following components:   B Natriuretic Peptide 3,160.0 (*)    All other components within normal limits   ____________________________________________  EKG  ED ECG REPORT I, Dionne Bucy, the attending physician, personally viewed and interpreted this ECG.  Date: 02/03/2018 EKG Time: 1942 Rate: 95 Rhythm: normal sinus rhythm QRS Axis: Left axis deviation Intervals: Incomplete RBBB ST/T Wave abnormalities: LVH with repolarization abnormality and T wave inversions in V5 and V6 Narrative Interpretation: Nonspecific T wave abnormalities but no evidence of acute ischemia; no significant change when compared to EKG of 01/21/2018  ____________________________________________  RADIOLOGY  CXR: Small right pleural effusion.  No evidence of interstitial edema. ____________________________________________   PROCEDURES  Procedure(s) performed: No  Procedures  Critical Care performed: No ____________________________________________   INITIAL IMPRESSION / ASSESSMENT AND PLAN / ED COURSE  Pertinent labs & imaging results that were available during my care of the patient were reviewed by me and considered in my medical decision making (see chart for details).  62 year old male with PMH as noted above presents with scrotal swelling, peripheral edema, and mild shortness of breath.  I reviewed past medical records in Epic; the patient was admitted earlier this month at [facility] for acute CHF with  significant fluid overload, acute renal insufficiency, as well as C. difficile colitis.  Patient states that he just finished the antibiotic today. Patient states that he was concerned because he developed some of the same symptoms as today prior to this hospitalization.  On exam, the patient is overall relatively well-appearing, and is quite comfortable.  His vital signs are normal.  His lungs are clear on exam.  He does have some scrotal swelling as well as peripheral edema but no other significant exam findings.  His lab work-up today has been reassuring.  His creatinine is improved from his hospitalization, troponin is similar to prior, and BNP is actually downtrending from previous.  Chest x-ray shows no significant edema.  The patient expresses a  strong preference to go home, but wanted to be evaluated to make sure that he was not developing the same issues as what led to his previous hospitalization.  Given his increased peripheral edema, I believe it would be reasonable to give a dose of IV Lasix in the ED.  There is no specific indication for admission at this time.  The patient agrees with this plan.  He has a plan to follow-up with his primary care doctor.  I had an extensive discussion with the patient and his family member about the results of the work-up, the follow-up plan, and return precautions.  He expressed understanding.   ____________________________________________   FINAL CLINICAL IMPRESSION(S) / ED DIAGNOSES  Final diagnoses:  Peripheral edema  Chronic congestive heart failure, unspecified heart failure type (HCC)      NEW MEDICATIONS STARTED DURING THIS VISIT:  New Prescriptions   No medications on file     Note:  This document was prepared using Dragon voice recognition software and may include unintentional dictation errors.    Dionne Bucy, MD 02/03/18 [phone]

## 2018-02-03 NOTE — Discharge Instructions (Addendum)
Follow-up with your primary care doctor and cardiologist as soon as possible.  Return to the ER for new, worsening, or persistent swelling, difficulty breathing, chest pain, weakness, or any other new or worsening symptoms that concern you.

## 2018-02-03 NOTE — Telephone Encounter (Signed)
Spoke with heart failure clinic and they would send the request in.

## 2018-02-05 ENCOUNTER — Encounter (HOSPITAL_COMMUNITY): Payer: Self-pay

## 2018-02-06 ENCOUNTER — Encounter (HOSPITAL_COMMUNITY): Payer: Self-pay | Admitting: Cardiology

## 2018-02-16 ENCOUNTER — Other Ambulatory Visit: Payer: Self-pay | Admitting: Cardiovascular Disease

## 2018-02-16 NOTE — Telephone Encounter (Signed)
Please review for refill, Thanks !  

## 2018-02-17 ENCOUNTER — Encounter: Payer: Self-pay | Admitting: Cardiology

## 2018-02-17 ENCOUNTER — Encounter: Payer: Self-pay | Admitting: Emergency Medicine

## 2018-02-17 ENCOUNTER — Emergency Department: Payer: BLUE CROSS/BLUE SHIELD

## 2018-02-17 ENCOUNTER — Emergency Department
Admission: EM | Admit: 2018-02-17 | Discharge: 2018-02-18 | Disposition: A | Discharge status: Home / Self Care | Payer: BLUE CROSS/BLUE SHIELD | Source: Home / Self Care | Attending: Emergency Medicine | Admitting: Emergency Medicine

## 2018-02-17 DIAGNOSIS — I5023 Acute on chronic systolic (congestive) heart failure: Secondary | ICD-10-CM | POA: Insufficient documentation

## 2018-02-17 DIAGNOSIS — J449 Chronic obstructive pulmonary disease, unspecified: Secondary | ICD-10-CM | POA: Insufficient documentation

## 2018-02-17 DIAGNOSIS — I509 Heart failure, unspecified: Secondary | ICD-10-CM

## 2018-02-17 DIAGNOSIS — Z79899 Other long term (current) drug therapy: Secondary | ICD-10-CM | POA: Insufficient documentation

## 2018-02-17 DIAGNOSIS — R0602 Shortness of breath: Secondary | ICD-10-CM | POA: Diagnosis not present

## 2018-02-17 DIAGNOSIS — I130 Hypertensive heart and chronic kidney disease with heart failure and stage 1 through stage 4 chronic kidney disease, or unspecified chronic kidney disease: Secondary | ICD-10-CM

## 2018-02-17 DIAGNOSIS — N184 Chronic kidney disease, stage 4 (severe): Secondary | ICD-10-CM

## 2018-02-17 LAB — CBC
HCT: 35.5 % — ABNORMAL LOW (ref 40.0–52.0)
HEMOGLOBIN: 11.3 g/dL — AB (ref 13.0–18.0)
MCH: 22.8 pg — AB (ref 26.0–34.0)
MCHC: 31.8 g/dL — AB (ref 32.0–36.0)
MCV: 71.6 fL — ABNORMAL LOW (ref 80.0–100.0)
PLATELETS: 281 10*3/uL (ref 150–440)
RBC: 4.97 MIL/uL (ref 4.40–5.90)
RDW: 28.2 % — ABNORMAL HIGH (ref 11.5–14.5)
WBC: 6.2 10*3/uL (ref 3.8–10.6)

## 2018-02-17 LAB — BASIC METABOLIC PANEL
ANION GAP: 12 (ref 5–15)
BUN: 58 mg/dL — ABNORMAL HIGH (ref 6–20)
CALCIUM: 9.1 mg/dL (ref 8.9–10.3)
CO2: 26 mmol/L (ref 22–32)
CREATININE: 3.13 mg/dL — AB (ref 0.61–1.24)
Chloride: 99 mmol/L — ABNORMAL LOW (ref 101–111)
GFR, EST AFRICAN AMERICAN: 23 mL/min — AB (ref >60)
GFR, EST NON AFRICAN AMERICAN: 20 mL/min — AB (ref >60)
GLUCOSE: 95 mg/dL (ref 65–99)
Potassium: 3.8 mmol/L (ref 3.5–5.1)
Sodium: 137 mmol/L (ref 135–145)

## 2018-02-17 LAB — BRAIN NATRIURETIC PEPTIDE: B Natriuretic Peptide: 4302 pg/mL — ABNORMAL HIGH (ref 0.0–100.0)

## 2018-02-17 LAB — TROPONIN I: TROPONIN I: 0.05 ng/mL — AB (ref <0.03)

## 2018-02-17 MED ORDER — FUROSEMIDE 10 MG/ML IJ SOLN
40.0000 mg | Freq: Once | INTRAMUSCULAR | Status: AC
  Administered 2018-02-17: 40 mg via INTRAVENOUS
  Filled 2018-02-17: qty 4

## 2018-02-17 NOTE — Discharge Instructions (Addendum)
Please seek medical attention for any high fevers, chest pain, shortness of breath, change in behavior, persistent vomiting, bloody stool or any other new or concerning symptoms.  

## 2018-02-17 NOTE — ED Triage Notes (Signed)
Patient presents to the ED with shortness of breath and swelling to legs and genital area.  Patient reports swelling x 3 days.  Patient states he has been itching every time he takes his medication.  Patient has history of congestive heart failure.  Patient very tachypnic in triage.  Patient states, "every time I lay down, I panic because I can't breathe."

## 2018-02-17 NOTE — ED Provider Notes (Signed)
[facility] Emergency Department Provider Note  ____________________________________________   I have reviewed the triage vital signs and the nursing notes.   HISTORY  Chief Complaint Shortness of Breath   History limited by: Not Limited   HPI Kelly Leonard is a 62 y.o. male who presents to the emergency department today because of concerns for swelling and difficulty with breathing.  The symptoms have been getting worse over the past 3 days.  Patient states he has noticed swelling in his legs and scrotum.  This has been accompanied by shortness of breath.  The shortness of breath is worse with exertion and lying flat.  Patient does have a history of CHF and states that this is happened to him in the past. He has not had any fevers. No chest pain. He does have secondary complaint of some pruritis after taking one of his medications.   Per medical record review patient has a history of chf, recent er visit for similar symptoms.  Past Medical History:  Diagnosis Date  . Anxiety   . CHF (congestive heart failure) (HCC)   . Chronic systolic congestive heart failure (HCC)   . CKD (chronic kidney disease) stage 4, GFR 15-29 ml/min (HCC)   . COPD (chronic obstructive pulmonary disease) (HCC)   . Dilated cardiomyopathy (HCC)   . Hyperlipidemia   . Hypertension   . MI (myocardial infarction) (HCC)   . Polycystic kidney   . Renal disorder     Patient Active Problem List   Diagnosis Date Noted  . Acute on chronic systolic CHF (congestive heart failure) (HCC) 01/22/2018  . Gout 09/19/2017  . Rash 09/16/2017  . Acute renal failure superimposed on stage 4 chronic kidney disease (HCC)   . Hyponatremia 07/05/2017  . Cramps, muscle, general 07/05/2017  . Weakness generalized 07/05/2017  . Insomnia 05/05/2017  . Advanced care planning/counseling discussion 03/24/2017  . 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 Hypertension   . Hyperlipidemia   .  Chronic systolic congestive heart failure (HCC)   . Dilated cardiomyopathy (HCC)   . Polycystic kidney   . CKD (chronic kidney disease) stage 4, GFR 15-29 ml/min (HCC)   . 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 Anxiety disorder, unspecified 03/03/2013    Past Surgical History:  Procedure Laterality Date  . CARDIAC CATHETERIZATION    . IMPLANTABLE CARDIOVERTER DEFIBRILLATOR (ICD) GENERATOR CHANGE Right 06/23/2015   Procedure: ICD GENERATOR  INITIAL IMPLANT;  Surgeon: Sharion Settler, MD;  Location: [facility] ORS;  Service: Cardiovascular;  Laterality: Right;    Prior to Admission medications   Medication Sig Start Date End Date Taking? Authorizing Provider  ALPRAZolam Prudy Feeler) 1 MG tablet Take 1 tablet (1 mg total) by mouth 3 (three) times daily as needed for up to 3 doses for sleep. Patient not taking: Reported on 01/21/2018 11/26/17   Darci Current, MD  B Complex Vitamins (B COMPLEX PO) Take 1 tablet by mouth daily.    [provider]  benztropine (COGENTIN) 1 MG tablet Take 1 tablet (1 mg total) by mouth daily for 3 days. Patient taking differently: Take 1 mg by mouth every 3 (three) days. AS NEEDED 12/31/17 02/03/18  Sharman Cheek, MD  bumetanide (BUMEX) 1 MG tablet Take 3 tablets (3 mg total) by mouth daily. May take additional 3 mg in the PM as needed for swelling/SOB 02/03/18   Bensimhon, Bevelyn Buckles, MD  busPIRone (BUSPAR) 5 MG tablet Take 1 tablet (5 mg total) 3 (  three) times daily by mouth. Patient not taking: Reported on 01/21/2018 07/15/17   Graciella Freer, PA-C  chlordiazePOXIDE (LIBRIUM) 10 MG capsule Take 1 capsule (10 mg total) by mouth 4 (four) times daily as needed (agitation). Patient taking differently: Take 10 mg by mouth 2 (two) times daily.  12/31/17   Sharman Cheek, MD  Cholecalciferol (VITAMIN D3) 1000 units CAPS Take 1,000 Units by mouth daily.    [provider]  colchicine 0.6 MG tablet Take 0.6 mg by mouth 2  (two) times daily as needed. Gout flare up 01/09/18   [provider]  Glecaprevir-Pibrentasvir (MAVYRET) 100-40 MG TABS Take 3 tablets by mouth daily with breakfast. Patient not taking: Reported on 01/21/2018 08/27/17   Kuppelweiser, Cassie L, RPH-CPP  isosorbide-hydrALAZINE (BIDIL) 20-37.5 MG tablet Take 2 tablets 3 (three) times daily by mouth.    [provider]  metolazone (ZAROXOLYN) 2.5 MG tablet Take 1 tablet (2.5 mg total) by mouth as directed. For weight of 140 lbs or greater Patient not taking: Reported on 02/03/2018 11/19/17 02/17/18  Bensimhon, Bevelyn Buckles, MD  promethazine (PHENERGAN) 25 MG tablet Take 25 mg by mouth every 6 (six) hours as needed for nausea or vomiting.    [provider]  vancomycin (VANCOCIN) 125 MG capsule Take 1 capsule (125 mg total) by mouth 4 (four) times daily. Patient not taking: Reported on 02/03/2018 01/25/18   Tyrone Nine, MD    Allergies Entresto [sacubitril-valsartan]; Penicillins; Seroquel [quetiapine]; Ace inhibitors; Corlanor [ivabradine]; Ambien [zolpidem tartrate]; Carvedilol; Hydroxyzine; Prednisone; Sertraline; Trazodone and nefazodone; Uloric [febuxostat]; Benadryl [diphenhydramine hcl]; Gabapentin; Lorazepam; and Torsemide  Family History  Problem Relation Age of Onset  . Diabetes Mother   . Hypertension Brother   . Heart disease Maternal Grandmother   . Heart attack Maternal Grandmother     Social History Social History   Tobacco Use  . Smoking status: Never Smoker  . Smokeless tobacco: Never Used  Substance Use Topics  . Alcohol use: No  . Drug use: No    Review of Systems Constitutional: No fever/chills Eyes: No visual changes. ENT: No sore throat. Cardiovascular: Denies chest pain. Respiratory: Positive for shortness of breath. Gastrointestinal: No abdominal pain.  No nausea, no vomiting.  No diarrhea.   Genitourinary: Positive for scrotal swelling. Musculoskeletal: Negative for back pain. Positive  for lower extremity swelling.  Skin: Positive for intermittent itchiness. Neurological: Negative for headaches, focal weakness or numbness.  ____________________________________________   PHYSICAL EXAM:  VITAL SIGNS: ED Triage Vitals  Enc Vitals Group     BP 02/17/18 1725 119/89     Pulse Rate 02/17/18 1725 95     Resp 02/17/18 1735 (!) 32     Temp 02/17/18 1725 97.8 F (36.6 C)     Temp Source 02/17/18 1725 Oral     SpO2 02/17/18 1725 100 %     Weight 02/17/18 1736 147 lb (66.7 kg)     Height 02/17/18 1736 5' 9.5" (1.765 m)     Head Circumference --      Peak Flow --      Pain Score 02/17/18 1736 0   Constitutional: Alert and oriented.  Eyes: Conjunctivae are normal.  ENT      Head: Normocephalic and atraumatic.      Nose: No congestion/rhinnorhea.      Mouth/Throat: Mucous membranes are moist.      Neck: No stridor. Hematological/Lymphatic/Immunilogical: No cervical lymphadenopathy. Cardiovascular: Normal rate, regular rhythm.  No murmurs, rubs, or gallops.  Respiratory: Tachypnea. Crackles noted in bilateral bases.  Gastrointestinal: Soft and non tender. No rebound. No guarding.  Genitourinary: Deferred Musculoskeletal: Normal range of motion in all extremities. 2+ bilateral pitting edema.  Neurologic:  Normal speech and language. No gross focal neurologic deficits are appreciated.  Skin:  Skin is warm, dry and intact. No rash noted. Psychiatric: Mood and affect are normal. Speech and behavior are normal. Patient exhibits appropriate insight and judgment.  ____________________________________________    LABS (pertinent positives/negatives)  CBC wbc 6.2, hgb 11.3, plt 281 BMP na 137, k 3.8, glu 95, cr 3.13 Trop 0.05 ____________________________________________   EKG  I, Phineas Semen, attending physician, personally viewed and interpreted this EKG  EKG Time: 1725 Rate: 99 Rhythm: sinus rhythm with PAC Axis: right axis deviation Intervals: qtc 485 QRS:  q waves v1, v2, v3 ST changes: no st elevation Impression: abnormal ekg  ____________________________________________    RADIOLOGY  CXR Cardiomegaly, mild chf  ____________________________________________   PROCEDURES  Procedures  ____________________________________________   INITIAL IMPRESSION / ASSESSMENT AND PLAN / ED COURSE  Pertinent labs & imaging results that were available during my care of the patient were reviewed by me and considered in my medical decision making (see chart for details).   Patient presented to the emergency department today because of concerns for shortness of breath.  Patient does have a history of heart failure.  Patient's chest x-ray does show findings consistent with mild CHF patient has an elevated BNP.  Troponin is elevated however this is not inconsistent with the patient's baseline.  Patient did feel better after IV Lasix.  He did feel comfortable going home.  Discussed with patient importance of follow-up.  Will give heart failure clinic follow-up information.   ____________________________________________   FINAL CLINICAL IMPRESSION(S) / ED DIAGNOSES  Final diagnoses:  Acute on chronic congestive heart failure, unspecified heart failure type [facility])     Note: This dictation was prepared with Dragon dictation. Any transcriptional errors that result from this process are unintentional     Phineas Semen, MD 02/18/18 0001

## 2018-02-17 NOTE — ED Notes (Signed)
Reviewed pt's chart; pt with hx CHF, chronic elevated troponin; BNP added; charge nurse aware

## 2018-02-17 NOTE — Telephone Encounter (Signed)
This is a CHF pt. Dr. Gala Romney is the primary cardiologist.

## 2018-02-17 NOTE — ED Notes (Signed)
Pt taken to room 26 via w/c by EDT Misty Stanley; report called to [facility] and notified of pending BNP

## 2018-02-18 ENCOUNTER — Inpatient Hospital Stay
Admission: EM | Admit: 2018-02-18 | Discharge: 2018-02-23 | DRG: 291 | Discharge status: Against Medical Advice | Payer: BLUE CROSS/BLUE SHIELD | Attending: Internal Medicine | Admitting: Internal Medicine

## 2018-02-18 ENCOUNTER — Emergency Department: Payer: BLUE CROSS/BLUE SHIELD

## 2018-02-18 ENCOUNTER — Encounter: Payer: Self-pay | Admitting: Emergency Medicine

## 2018-02-18 ENCOUNTER — Telehealth: Payer: Self-pay | Admitting: Unknown Physician Specialty

## 2018-02-18 DIAGNOSIS — Q613 Polycystic kidney, unspecified: Secondary | ICD-10-CM | POA: Diagnosis not present

## 2018-02-18 DIAGNOSIS — I1310 Hypertensive heart and chronic kidney disease without heart failure, with stage 1 through stage 4 chronic kidney disease, or unspecified chronic kidney disease: Secondary | ICD-10-CM | POA: Diagnosis not present

## 2018-02-18 DIAGNOSIS — I5043 Acute on chronic combined systolic (congestive) and diastolic (congestive) heart failure: Secondary | ICD-10-CM

## 2018-02-18 DIAGNOSIS — Z888 Allergy status to other drugs, medicaments and biological substances status: Secondary | ICD-10-CM | POA: Diagnosis not present

## 2018-02-18 DIAGNOSIS — Z515 Encounter for palliative care: Secondary | ICD-10-CM | POA: Diagnosis not present

## 2018-02-18 DIAGNOSIS — I361 Nonrheumatic tricuspid (valve) insufficiency: Secondary | ICD-10-CM | POA: Diagnosis not present

## 2018-02-18 DIAGNOSIS — Z95810 Presence of automatic (implantable) cardiac defibrillator: Secondary | ICD-10-CM | POA: Diagnosis not present

## 2018-02-18 DIAGNOSIS — E871 Hypo-osmolality and hyponatremia: Secondary | ICD-10-CM | POA: Diagnosis present

## 2018-02-18 DIAGNOSIS — I248 Other forms of acute ischemic heart disease: Secondary | ICD-10-CM | POA: Diagnosis present

## 2018-02-18 DIAGNOSIS — J449 Chronic obstructive pulmonary disease, unspecified: Secondary | ICD-10-CM | POA: Diagnosis present

## 2018-02-18 DIAGNOSIS — R0602 Shortness of breath: Secondary | ICD-10-CM | POA: Diagnosis present

## 2018-02-18 DIAGNOSIS — I10 Essential (primary) hypertension: Secondary | ICD-10-CM | POA: Diagnosis not present

## 2018-02-18 DIAGNOSIS — Z7189 Other specified counseling: Secondary | ICD-10-CM | POA: Diagnosis not present

## 2018-02-18 DIAGNOSIS — R570 Cardiogenic shock: Secondary | ICD-10-CM | POA: Diagnosis not present

## 2018-02-18 DIAGNOSIS — F419 Anxiety disorder, unspecified: Secondary | ICD-10-CM | POA: Diagnosis not present

## 2018-02-18 DIAGNOSIS — I272 Pulmonary hypertension, unspecified: Secondary | ICD-10-CM | POA: Diagnosis present

## 2018-02-18 DIAGNOSIS — N19 Unspecified kidney failure: Secondary | ICD-10-CM | POA: Diagnosis not present

## 2018-02-18 DIAGNOSIS — R339 Retention of urine, unspecified: Secondary | ICD-10-CM | POA: Diagnosis present

## 2018-02-18 DIAGNOSIS — Z8619 Personal history of other infectious and parasitic diseases: Secondary | ICD-10-CM | POA: Diagnosis not present

## 2018-02-18 DIAGNOSIS — D631 Anemia in chronic kidney disease: Secondary | ICD-10-CM | POA: Diagnosis present

## 2018-02-18 DIAGNOSIS — B182 Chronic viral hepatitis C: Secondary | ICD-10-CM | POA: Diagnosis not present

## 2018-02-18 DIAGNOSIS — I130 Hypertensive heart and chronic kidney disease with heart failure and stage 1 through stage 4 chronic kidney disease, or unspecified chronic kidney disease: Secondary | ICD-10-CM | POA: Diagnosis present

## 2018-02-18 DIAGNOSIS — Z8249 Family history of ischemic heart disease and other diseases of the circulatory system: Secondary | ICD-10-CM | POA: Diagnosis not present

## 2018-02-18 DIAGNOSIS — I252 Old myocardial infarction: Secondary | ICD-10-CM | POA: Diagnosis not present

## 2018-02-18 DIAGNOSIS — I5020 Unspecified systolic (congestive) heart failure: Secondary | ICD-10-CM

## 2018-02-18 DIAGNOSIS — Z880 Allergy status to penicillin: Secondary | ICD-10-CM

## 2018-02-18 DIAGNOSIS — I509 Heart failure, unspecified: Secondary | ICD-10-CM

## 2018-02-18 DIAGNOSIS — N184 Chronic kidney disease, stage 4 (severe): Secondary | ICD-10-CM | POA: Diagnosis present

## 2018-02-18 DIAGNOSIS — Z7952 Long term (current) use of systemic steroids: Secondary | ICD-10-CM | POA: Diagnosis not present

## 2018-02-18 DIAGNOSIS — I340 Nonrheumatic mitral (valve) insufficiency: Secondary | ICD-10-CM | POA: Diagnosis present

## 2018-02-18 DIAGNOSIS — I5023 Acute on chronic systolic (congestive) heart failure: Secondary | ICD-10-CM | POA: Diagnosis present

## 2018-02-18 DIAGNOSIS — F410 Panic disorder [episodic paroxysmal anxiety] without agoraphobia: Secondary | ICD-10-CM | POA: Diagnosis present

## 2018-02-18 DIAGNOSIS — R600 Localized edema: Secondary | ICD-10-CM | POA: Diagnosis not present

## 2018-02-18 DIAGNOSIS — I428 Other cardiomyopathies: Secondary | ICD-10-CM | POA: Diagnosis present

## 2018-02-18 DIAGNOSIS — N179 Acute kidney failure, unspecified: Secondary | ICD-10-CM | POA: Diagnosis not present

## 2018-02-18 DIAGNOSIS — R188 Other ascites: Secondary | ICD-10-CM | POA: Diagnosis not present

## 2018-02-18 DIAGNOSIS — E785 Hyperlipidemia, unspecified: Secondary | ICD-10-CM | POA: Diagnosis present

## 2018-02-18 DIAGNOSIS — I420 Dilated cardiomyopathy: Secondary | ICD-10-CM | POA: Diagnosis present

## 2018-02-18 DIAGNOSIS — Z9114 Patient's other noncompliance with medication regimen: Secondary | ICD-10-CM | POA: Diagnosis not present

## 2018-02-18 HISTORY — DX: Panic disorder (episodic paroxysmal anxiety): F41.0

## 2018-02-18 HISTORY — DX: Nonrheumatic mitral (valve) insufficiency: I34.0

## 2018-02-18 HISTORY — DX: Other cardiomyopathies: I42.8

## 2018-02-18 HISTORY — DX: Other specified disorders of kidney and ureter: N28.89

## 2018-02-18 LAB — URINALYSIS, COMPLETE (UACMP) WITH MICROSCOPIC
BILIRUBIN URINE: NEGATIVE
Bacteria, UA: NONE SEEN
Glucose, UA: NEGATIVE mg/dL
Hgb urine dipstick: NEGATIVE
Ketones, ur: NEGATIVE mg/dL
Leukocytes, UA: NEGATIVE
Nitrite: NEGATIVE
Protein, ur: 100 mg/dL — AB
SPECIFIC GRAVITY, URINE: 1.008 (ref 1.005–1.030)
Squamous Epithelial / LPF: NONE SEEN (ref 0–5)
pH: 6 (ref 5.0–8.0)

## 2018-02-18 LAB — COMPREHENSIVE METABOLIC PANEL
ALBUMIN: 3.4 g/dL — AB (ref 3.5–5.0)
ALT: 28 U/L (ref 17–63)
ANION GAP: 15 (ref 5–15)
AST: 57 U/L — ABNORMAL HIGH (ref 15–41)
Alkaline Phosphatase: 104 U/L (ref 38–126)
BUN: 66 mg/dL — ABNORMAL HIGH (ref 6–20)
CHLORIDE: 95 mmol/L — AB (ref 101–111)
CO2: 25 mmol/L (ref 22–32)
Calcium: 8.9 mg/dL (ref 8.9–10.3)
Creatinine, Ser: 3.19 mg/dL — ABNORMAL HIGH (ref 0.61–1.24)
GFR calc Af Amer: 23 mL/min — ABNORMAL LOW (ref >60)
GFR calc non Af Amer: 19 mL/min — ABNORMAL LOW (ref >60)
GLUCOSE: 94 mg/dL (ref 65–99)
POTASSIUM: 4.7 mmol/L (ref 3.5–5.1)
SODIUM: 135 mmol/L (ref 135–145)
Total Bilirubin: 1.2 mg/dL (ref 0.3–1.2)
Total Protein: 7.4 g/dL (ref 6.5–8.1)

## 2018-02-18 LAB — CBC WITH DIFFERENTIAL/PLATELET
Basophils Absolute: 0.1 10*3/uL (ref 0–0.1)
Basophils Relative: 1 %
EOS ABS: 0 10*3/uL (ref 0–0.7)
Eosinophils Relative: 0 %
HCT: 38.4 % — ABNORMAL LOW (ref 40.0–52.0)
HEMOGLOBIN: 12.3 g/dL — AB (ref 13.0–18.0)
LYMPHS ABS: 0.9 10*3/uL — AB (ref 1.0–3.6)
LYMPHS PCT: 13 %
MCH: 23.2 pg — AB (ref 26.0–34.0)
MCHC: 32.1 g/dL (ref 32.0–36.0)
MCV: 72.3 fL — AB (ref 80.0–100.0)
Monocytes Absolute: 0.5 10*3/uL (ref 0.2–1.0)
Monocytes Relative: 8 %
NEUTROS ABS: 5.4 10*3/uL (ref 1.4–6.5)
Neutrophils Relative %: 78 %
Platelets: 262 10*3/uL (ref 150–440)
RBC: 5.32 MIL/uL (ref 4.40–5.90)
RDW: 29.1 % — ABNORMAL HIGH (ref 11.5–14.5)
WBC: 7 10*3/uL (ref 3.8–10.6)

## 2018-02-18 LAB — BRAIN NATRIURETIC PEPTIDE: B Natriuretic Peptide: 3332 pg/mL — ABNORMAL HIGH (ref 0.0–100.0)

## 2018-02-18 LAB — TROPONIN I: Troponin I: 0.05 ng/mL (ref <0.03)

## 2018-02-18 MED ORDER — ONDANSETRON HCL 4 MG PO TABS
4.0000 mg | ORAL_TABLET | Freq: Four times a day (QID) | ORAL | Status: DC | PRN
  Administered 2018-02-19: 4 mg via ORAL
  Filled 2018-02-18: qty 1

## 2018-02-18 MED ORDER — ORAL CARE MOUTH RINSE
15.0000 mL | Freq: Two times a day (BID) | OROMUCOSAL | Status: DC
  Administered 2018-02-18 – 2018-02-23 (×9): 15 mL via OROMUCOSAL

## 2018-02-18 MED ORDER — HEPARIN SODIUM (PORCINE) 5000 UNIT/ML IJ SOLN
5000.0000 [IU] | Freq: Three times a day (TID) | INTRAMUSCULAR | Status: DC
Start: 2018-02-18 — End: 2018-02-23
  Administered 2018-02-18 – 2018-02-23 (×13): 5000 [IU] via SUBCUTANEOUS
  Filled 2018-02-18 (×13): qty 1

## 2018-02-18 MED ORDER — TRIAMCINOLONE ACETONIDE 0.1 % EX OINT
1.0000 "application " | TOPICAL_OINTMENT | Freq: Two times a day (BID) | CUTANEOUS | Status: DC
  Administered 2018-02-18 – 2018-02-22 (×7): 1 via TOPICAL
  Filled 2018-02-18 (×2): qty 15

## 2018-02-18 MED ORDER — DEXTROSE 5 % IV SOLN
10.0000 mg/h | INTRAVENOUS | Status: DC
  Administered 2018-02-18 – 2018-02-22 (×4): 10 mg/h via INTRAVENOUS
  Filled 2018-02-18 (×2): qty 25
  Filled 2018-02-18: qty 20
  Filled 2018-02-18: qty 25

## 2018-02-18 MED ORDER — ACETAMINOPHEN 325 MG PO TABS: 650.0000 mg | ORAL_TABLET | Freq: Four times a day (QID) | ORAL | Status: DC | PRN

## 2018-02-18 MED ORDER — VITAMIN D3 25 MCG (1000 UNIT) PO TABS
1000.0000 [IU] | ORAL_TABLET | Freq: Every day | ORAL | Status: DC
  Administered 2018-02-18 – 2018-02-23 (×6): 1000 [IU] via ORAL
  Filled 2018-02-18 (×9): qty 1

## 2018-02-18 MED ORDER — ISOSORB DINITRATE-HYDRALAZINE 20-37.5 MG PO TABS
2.0000 | ORAL_TABLET | Freq: Three times a day (TID) | ORAL | Status: DC
  Filled 2018-02-18 (×4): qty 2

## 2018-02-18 MED ORDER — ACETAMINOPHEN 650 MG RE SUPP: 650.0000 mg | Freq: Four times a day (QID) | RECTAL | Status: DC | PRN

## 2018-02-18 MED ORDER — ONDANSETRON HCL 4 MG/2ML IJ SOLN
4.0000 mg | Freq: Four times a day (QID) | INTRAMUSCULAR | Status: DC | PRN
  Administered 2018-02-21: 4 mg via INTRAVENOUS
  Filled 2018-02-18: qty 2

## 2018-02-18 MED ORDER — PREDNISONE 10 MG PO TABS
5.0000 mg | ORAL_TABLET | Freq: Every day | ORAL | Status: DC
  Filled 2018-02-18: qty 1

## 2018-02-18 MED ORDER — CHLORDIAZEPOXIDE HCL 10 MG PO CAPS
10.0000 mg | ORAL_CAPSULE | Freq: Four times a day (QID) | ORAL | Status: DC | PRN
Start: 2018-02-18 — End: 2018-02-23

## 2018-02-18 MED ORDER — FUROSEMIDE 10 MG/ML IJ SOLN
80.0000 mg | Freq: Once | INTRAMUSCULAR | Status: AC
  Administered 2018-02-18: 80 mg via INTRAVENOUS
  Filled 2018-02-18: qty 8

## 2018-02-18 NOTE — ED Triage Notes (Signed)
Pt called [facility] for SOB. Pt has +3 pitting edema BLE, swollen genitals, and mild edema in abdomen. Pt's VS are stable at this time.

## 2018-02-18 NOTE — Telephone Encounter (Signed)
According to chart review, it looks like the patient already goes to cardiology.

## 2018-02-18 NOTE — ED Notes (Signed)
Bladder scan read of fluid, MD notified

## 2018-02-18 NOTE — Telephone Encounter (Signed)
He should be able to get a cardiac rehab referral from cardiology. If he can't- let us know.

## 2018-02-18 NOTE — H&P (Signed)
[employer] [facility] [place] [facility]   PATIENT NAME: Kelly Leonard    MR#:  [id number]  DATE OF BIRTH:  [birth year]  DATE OF ADMISSION:  02/18/2018  PRIMARY CARE PHYSICIAN: Corky Downs, MD   REQUESTING/REFERRING PHYSICIAN: Dr. Dorothea Glassman  CHIEF COMPLAINT:   Chief Complaint  Patient presents with  . Shortness of Breath    HISTORY OF PRESENT ILLNESS:  Kelly Leonard  is a 62 y.o. 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  Patient was admitted to [facility] in the middle of May for similar reasons and diuresis with high doses of IV Lasix which he responded well to.  His weight was down to 127 pounds then.  He presented to the ER yesterday due to shortness of breath and worsening lower extremity edema, he received some IV Lasix felt better and was discharged home.  He now returns back to the emergency room as he has gained 5 pounds overnight, his weight is up to 152 pounds as per him.  He feels significantly short of breath even at rest with worsening lower extremity edema now with penile edema and scrotal edema.  Hospitalist services were contacted for treatment evaluation.  Patient denies any chest pains, admits to some nausea but no vomiting.  He he does complain of abdominal distention but no pain.  He denies any dysuria, hematuria, melena, hematochezia, fever chills cough or any other associated symptoms.  PAST MEDICAL HISTORY:   Past Medical History:  Diagnosis Date  . Anxiety   . CHF (congestive heart failure) (HCC)   . Chronic systolic congestive heart failure (HCC)   . CKD (chronic kidney disease) stage 4, GFR 15-29 ml/min (HCC)   . COPD (chronic obstructive pulmonary disease) (HCC)   . Dilated  cardiomyopathy (HCC)   . Hyperlipidemia   . Hypertension   . MI (myocardial infarction) (HCC)   . Polycystic kidney   . Renal disorder     PAST SURGICAL HISTORY:   Past Surgical History:  Procedure Laterality Date  . CARDIAC CATHETERIZATION    . IMPLANTABLE CARDIOVERTER DEFIBRILLATOR (ICD) GENERATOR CHANGE Right 06/23/2015   Procedure: ICD GENERATOR  INITIAL IMPLANT;  Surgeon: Sharion Settler, MD;  Location: [facility] ORS;  Service: Cardiovascular;  Laterality: Right;    SOCIAL HISTORY:   Social History   Tobacco Use  . Smoking status: Never Smoker  . Smokeless tobacco: Never Used  Substance Use Topics  . Alcohol use: No    FAMILY HISTORY:   Family History  Problem Relation Age of Onset  . Diabetes Mother   . Hypertension Brother   . Heart disease Maternal Grandmother   . Heart attack Maternal Grandmother     DRUG ALLERGIES:   Allergies  Allergen Reactions  . Entresto [Sacubitril-Valsartan] Swelling    Swelling of the face  . Penicillins Anaphylaxis and Swelling    Has patient had a PCN reaction causing immediate rash, facial/tongue/throat swelling, SOB or lightheadedness with hypotension: Yes Has patient had a PCN reaction causing severe rash involving mucus membranes or skin necrosis: No Has patient had a PCN reaction that required hospitalization: No Has patient had a PCN reaction occurring within the last 10 years: No If all of the above answers are "NO", then may proceed with Cephalosporin use.   Marland Kitchen  Seroquel [Quetiapine] Nausea And Vomiting and Hives    Hives   . Ace Inhibitors Rash  . Corlanor [Ivabradine] Swelling    Facial swelling  . Ambien [Zolpidem Tartrate] Swelling  . Bidil [Isosorb Dinitrate-Hydralazine] Hives  . Carvedilol Other (See Comments)    "dizziness, light headed, and nausea"  . Hydroxyzine Nausea Only  . Prednisone Other (See Comments)    Causes fluid build up  . Sertraline Diarrhea    itching  . Trazodone And Nefazodone Swelling and  Other (See Comments)    "up for days" and lip swelling  . Uloric [Febuxostat] Other (See Comments)    Jittery, jerky, makes him lose his mind  . Benadryl [Diphenhydramine Hcl] Other (See Comments)    "makes me crazy and hyper"  . Gabapentin Swelling    Lip and face swelling  . Lorazepam Rash    Rash and severe anxiety  . Torsemide Hives, Itching, Nausea And Vomiting and Rash    REVIEW OF SYSTEMS:   Review of Systems  Constitutional: Negative for fever and weight loss.  HENT: Negative for congestion, nosebleeds and tinnitus.   Eyes: Negative for blurred vision, double vision and redness.  Respiratory: Positive for shortness of breath. Negative for cough and hemoptysis.   Cardiovascular: Positive for orthopnea, leg swelling and PND. Negative for chest pain.  Gastrointestinal: Negative for abdominal pain, diarrhea, melena, nausea and vomiting.  Genitourinary: Negative for dysuria, hematuria and urgency.  Musculoskeletal: Negative for falls and joint pain.  Neurological: Negative for dizziness, tingling, sensory change, focal weakness, seizures, weakness and headaches.  Endo/Heme/Allergies: Negative for polydipsia. Does not bruise/bleed easily.  Psychiatric/Behavioral: Negative for depression and memory loss. The patient is not nervous/anxious.     MEDICATIONS AT HOME:   Prior to Admission medications   Medication Sig Start Date End Date Taking? Authorizing Provider  ALPRAZolam Prudy Feeler) 1 MG tablet Take 1 tablet (1 mg total) by mouth 3 (three) times daily as needed for up to 3 doses for sleep. Patient not taking: Reported on 01/21/2018 11/26/17   Darci Current, MD  B Complex Vitamins (B COMPLEX PO) Take 1 tablet by mouth daily.    [provider]  bumetanide (BUMEX) 1 MG tablet Take 3 tablets (3 mg total) by mouth daily. May take additional 3 mg in the PM as needed for swelling/SOB 02/03/18   Bensimhon, Bevelyn Buckles, MD  busPIRone (BUSPAR) 5 MG tablet Take 1 tablet (5 mg total)  3 (three) times daily by mouth. Patient not taking: Reported on 01/21/2018 07/15/17   Graciella Freer, PA-C  chlordiazePOXIDE (LIBRIUM) 10 MG capsule Take 1 capsule (10 mg total) by mouth 4 (four) times daily as needed (agitation). 12/31/17   Sharman Cheek, MD  Cholecalciferol (VITAMIN D3) 1000 units CAPS Take 1,000 Units by mouth daily.    [provider]  colchicine 0.6 MG tablet Take 0.6 mg by mouth 2 (two) times daily as needed. Gout flare up 01/09/18   [provider]  Glecaprevir-Pibrentasvir (MAVYRET) 100-40 MG TABS Take 3 tablets by mouth daily with breakfast. Patient not taking: Reported on 01/21/2018 08/27/17   Kuppelweiser, Cassie L, RPH-CPP  isosorbide-hydrALAZINE (BIDIL) 20-37.5 MG tablet Take 2 tablets 3 (three) times daily by mouth.    [provider]  KLOR-CON M20 20 MEQ tablet Take 20 mEq by mouth daily. 01/18/18   [provider]  metolazone (ZAROXOLYN) 2.5 MG tablet Take 1 tablet (2.5 mg total) by mouth as directed. For weight of 140 lbs or greater  Patient not taking: Reported on 02/03/2018 11/19/17 02/17/18  Bensimhon, Bevelyn Buckles, MD  predniSONE (DELTASONE) 5 MG tablet Take 5 mg by mouth daily. 02/02/18   [provider]  promethazine (PHENERGAN) 25 MG tablet Take 25 mg by mouth every 6 (six) hours as needed for nausea or vomiting.    [provider]  triamcinolone ointment (KENALOG) 0.1 % Apply 1 application topically 2 (two) times daily. 02/06/18   [provider]  vancomycin (VANCOCIN) 125 MG capsule Take 1 capsule (125 mg total) by mouth 4 (four) times daily. Patient not taking: Reported on 02/03/2018 01/25/18   Tyrone Nine, MD      VITAL SIGNS:  Blood pressure 127/87, pulse (!) 103, temperature (!) 97.5 F (36.4 C), resp. rate 20, SpO2 99 %.  PHYSICAL EXAMINATION:  Physical Exam  GENERAL:  62 y.o.-year-old patient lying in the bed in mild Resp. Distress.  EYES: Pupils equal, round, reactive to light and  accommodation. No scleral icterus. Extraocular muscles intact.  HEENT: Head atraumatic, normocephalic. Oropharynx and nasopharynx clear. No oropharyngeal erythema, moist oral mucosa  NECK:  Supple, + jugular venous distention. No thyroid enlargement, no tenderness.  LUNGS: Normal breath sounds bilaterally, no wheezing, bibasilar rales, rhonchi. No use of accessory muscles of respiration.  CARDIOVASCULAR: S1, S2 RRR. No murmurs, rubs, gallops, clicks.  ABDOMEN: Soft, nontender, nondistended. Bowel sounds present. No organomegaly or mass.  EXTREMITIES: + 2-3 edema /bl, No cyanosis, or clubbing. + 2 pedal & radial pulses b/l.   NEUROLOGIC: Cranial nerves II through XII are intact. No focal Motor or sensory deficits appreciated b/l PSYCHIATRIC: The patient is alert and oriented x 3. SKIN: No obvious rash, lesion, or ulcer.   GU: + penile and scrotal edema  LABORATORY PANEL:   CBC Recent Labs  Lab 02/18/18 1616  WBC 7.0  HGB 12.3*  HCT 38.4*  PLT 262   ------------------------------------------------------------------------------------------------------------------  Chemistries  Recent Labs  Lab 02/18/18 1616  NA 135  K 4.7  CL 95*  CO2 25  GLUCOSE 94  BUN 66*  CREATININE 3.19*  CALCIUM 8.9  AST 57*  ALT 28  ALKPHOS 104  BILITOT 1.2   ------------------------------------------------------------------------------------------------------------------  Cardiac Enzymes Recent Labs  Lab 02/18/18 1616  TROPONINI 0.05*   ------------------------------------------------------------------------------------------------------------------  RADIOLOGY:  Dg Chest 2 View  Result Date: 02/17/2018 CLINICAL DATA:  Dyspnea with swelling of the legs in general region region. Swelling x3 days. EXAM: CHEST - 2 VIEW COMPARISON:  02/03/2018 FINDINGS: Stable cardiomegaly with minimal aortic atherosclerosis. Mild vascular congestion is noted. ICD device projects over the left upper thorax with  lead in the right ventricle. Bibasilar atelectasis and/or scarring is seen. Probable small right pleural effusion blunting cardiophrenic angle. No acute nor suspicious osseous abnormality. IMPRESSION: Cardiomegaly with slight increase in pulmonary vascular congestion since prior. Bibasilar atelectasis with probable small right pleural effusion. Findings likely represent a component of mild CHF. Electronically Signed   By: Tollie Eth M.D.   On: 02/17/2018 18:41   Dg Chest Portable 1 View  Result Date: 02/18/2018 CLINICAL DATA:  Pt has CHF. Hx of CHF, COPD, hypertension, MI, defibrillator. Non smoker EXAM: PORTABLE CHEST 1 VIEW COMPARISON:  Chest x-ray dated 02/17/2018 and chest x-ray dated 10/03/2017. FINDINGS: Cardiomegaly is grossly stable. Probable mild edema within the RIGHT perihilar lung. Lungs otherwise clear. No pleural effusion or pneumothorax seen. LEFT chest wall ICD apparatus is stable in position. No acute or suspicious osseous finding. IMPRESSION: 1. Vague opacity within the RIGHT perihilar lung,  most likely mild asymmetric edema related to the given history of CHF, alternatively fluid within the RIGHT minor fissure. Lungs otherwise clear. 2. Stable cardiomegaly. Electronically Signed   By: Bary Richard M.D.   On: 02/18/2018 16:52     IMPRESSION AND PLAN:   62 year old male with past medical history of severe dilated cardiomyopathy EF of 20%, status post AICD, chronic systolic CHF, COPD, CKD stage IV, polycystic kidney disease who presents to the hospital due to shortness of breath and worsening lower extremity edema.  1.  CHF-acute on chronic systolic dysfunction.  Patient presents with significant volume overload and anasarca. - We will start the patient on Lasix drip, follow I's and O's and daily weights. - Continue Imdur, hydralazine.  Will get cardiology consult and discussed the case with Dr. Mariah Milling  2.  Chronic kidney disease stage IV-secondary to polycystic kidney disease.   Patient's creatinine is close to baseline.  Patient will get aggressive diuresis with IV Lasix.  Will follow renal function, get a nephrology consult.  3.  Essential hypertension-continue Imdur, hydralazine.  4.  COPD-no acute exacerbation-continue maintenance prednisone.  5.  Recent history of C. difficile colitis-no diarrhea presently.  Patient has finished treatment with oral vancomycin.  6.  Elevated troponin-secondary to supply demand ischemia from CKD.  No acute chest pain.    All the records are reviewed and case discussed with ED provider. Management plans discussed with the patient, family and they are in agreement.  CODE STATUS: Full code  TOTAL TIME TAKING CARE OF THIS PATIENT: 45 minutes.    Houston Siren M.D on 02/18/2018 at 6:38 PM  Between 7am to 6pm - Pager - [phone]  After 6pm go to [email or web address] - password [email or web address]  [facility]  Office  [phone]  CC: Primary care physician; Corky Downs, MD

## 2018-02-18 NOTE — Telephone Encounter (Signed)
See message please advise ° °Thank you °

## 2018-02-18 NOTE — ED Provider Notes (Signed)
[facility] Emergency Department Provider Note   ____________________________________________   First MD Initiated Contact with Patient 02/18/18 1614     (approximate)  I have reviewed the triage vital signs and the nursing notes.   HISTORY  Chief Complaint Shortness of Breath    HPI Wilmore Holsomback is a 62 y.o. male was here yesterday for congestive failure got some Lasix felt better went home returns today because he has increasing amounts of swelling he reports his penis is swollen he cannot pee out of it and is having some shortness of breath.  Patient is very edematous in his legs all the way up to his abdomen.  He has pitting edema in his back at about L 2.    Past Medical History:  Diagnosis Date  . Anxiety   . CHF (congestive heart failure) (HCC)   . Chronic systolic congestive heart failure (HCC)   . CKD (chronic kidney disease) stage 4, GFR 15-29 ml/min (HCC)   . COPD (chronic obstructive pulmonary disease) (HCC)   . Dilated cardiomyopathy (HCC)   . Hyperlipidemia   . Hypertension   . MI (myocardial infarction) (HCC)   . Polycystic kidney   . Renal disorder     Patient Active Problem List   Diagnosis Date Noted  . Acute on chronic systolic CHF (congestive heart failure) (HCC) 01/22/2018  . Gout 09/19/2017  . Rash 09/16/2017  . Acute renal failure superimposed on stage 4 chronic kidney disease (HCC)   . Hyponatremia 07/05/2017  . Cramps, muscle, general 07/05/2017  . Weakness generalized 07/05/2017  . Insomnia 05/05/2017  . Advanced care planning/counseling discussion 03/24/2017  . 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 Hypertension   . Hyperlipidemia   . Chronic systolic congestive heart failure (HCC)   . Dilated cardiomyopathy (HCC)   . Polycystic kidney   . CKD (chronic kidney disease) stage 4, GFR 15-29 ml/min (HCC)   . 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 Anxiety disorder,  unspecified 03/03/2013    Past Surgical History:  Procedure Laterality Date  . CARDIAC CATHETERIZATION    . IMPLANTABLE CARDIOVERTER DEFIBRILLATOR (ICD) GENERATOR CHANGE Right 06/23/2015   Procedure: ICD GENERATOR  INITIAL IMPLANT;  Surgeon: Sharion Settler, MD;  Location: [facility] ORS;  Service: Cardiovascular;  Laterality: Right;    Prior to Admission medications   Medication Sig Start Date End Date Taking? Authorizing Provider  ALPRAZolam Prudy Feeler) 1 MG tablet Take 1 tablet (1 mg total) by mouth 3 (three) times daily as needed for up to 3 doses for sleep. Patient not taking: Reported on 01/21/2018 11/26/17   Darci Current, MD  B Complex Vitamins (B COMPLEX PO) Take 1 tablet by mouth daily.    [provider]  bumetanide (BUMEX) 1 MG tablet Take 3 tablets (3 mg total) by mouth daily. May take additional 3 mg in the PM as needed for swelling/SOB 02/03/18   Bensimhon, Bevelyn Buckles, MD  busPIRone (BUSPAR) 5 MG tablet Take 1 tablet (5 mg total) 3 (three) times daily by mouth. Patient not taking: Reported on 01/21/2018 07/15/17   Graciella Freer, PA-C  chlordiazePOXIDE (LIBRIUM) 10 MG capsule Take 1 capsule (10 mg total) by mouth 4 (four) times daily as needed (agitation). 12/31/17   Sharman Cheek, MD  Cholecalciferol (VITAMIN D3) 1000 units CAPS Take 1,000 Units by mouth daily.    [provider]  colchicine 0.6 MG tablet Take 0.6 mg by mouth 2 (two) times daily as  needed. Gout flare up 01/09/18   [provider]  Glecaprevir-Pibrentasvir (MAVYRET) 100-40 MG TABS Take 3 tablets by mouth daily with breakfast. Patient not taking: Reported on 01/21/2018 08/27/17   Kuppelweiser, Cassie L, RPH-CPP  isosorbide-hydrALAZINE (BIDIL) 20-37.5 MG tablet Take 2 tablets 3 (three) times daily by mouth.    [provider]  KLOR-CON M20 20 MEQ tablet Take 20 mEq by mouth daily. 01/18/18   [provider]  metolazone (ZAROXOLYN) 2.5 MG tablet Take 1 tablet (2.5 mg total)  by mouth as directed. For weight of 140 lbs or greater Patient not taking: Reported on 02/03/2018 11/19/17 02/17/18  Bensimhon, Bevelyn Buckles, MD  predniSONE (DELTASONE) 5 MG tablet Take 5 mg by mouth daily. 02/02/18   [provider]  promethazine (PHENERGAN) 25 MG tablet Take 25 mg by mouth every 6 (six) hours as needed for nausea or vomiting.    [provider]  triamcinolone ointment (KENALOG) 0.1 % Apply 1 application topically 2 (two) times daily. 02/06/18   [provider]  vancomycin (VANCOCIN) 125 MG capsule Take 1 capsule (125 mg total) by mouth 4 (four) times daily. Patient not taking: Reported on 02/03/2018 01/25/18   Tyrone Nine, MD    Allergies Entresto [sacubitril-valsartan]; Penicillins; Seroquel [quetiapine]; Ace inhibitors; Corlanor [ivabradine]; Ambien [zolpidem tartrate]; Bidil [isosorb dinitrate-hydralazine]; Carvedilol; Hydroxyzine; Prednisone; Sertraline; Trazodone and nefazodone; Uloric [febuxostat]; Benadryl [diphenhydramine hcl]; Gabapentin; Lorazepam; and Torsemide  Family History  Problem Relation Age of Onset  . Diabetes Mother   . Hypertension Brother   . Heart disease Maternal Grandmother   . Heart attack Maternal Grandmother     Social History Social History   Tobacco Use  . Smoking status: Never Smoker  . Smokeless tobacco: Never Used  Substance Use Topics  . Alcohol use: No  . Drug use: No    Review of Systems  Constitutional: No fever/chills Eyes: No visual changes. ENT: No sore throat. Cardiovascular: Denies chest pain. Respiratory: Some shortness of breath. Gastrointestinal: No abdominal pain.  No nausea, no vomiting.  No diarrhea.  No constipation. Genitourinary: See HPI Musculoskeletal: Negative for back pain. Skin: Negative for rash. Neurological: Negative for headaches, focal weakness   ____________________________________________   PHYSICAL EXAM:  VITAL SIGNS: ED Triage Vitals  Enc Vitals Group     BP  02/18/18 1612 (!) 115/94     Pulse --      Resp --      Temp 02/18/18 1612 (!) 97.5 F (36.4 C)     Temp src --      SpO2 02/18/18 1610 98 %     Weight --      Height --      Head Circumference --      Peak Flow --      Pain Score 02/18/18 1614 0     Pain Loc --      Pain Edu? --      Excl. in GC? --     Constitutional: Alert and oriented. Well appearing and in no acute distress on oxygen please note he does not usually use oxygen. Eyes: Conjunctivae are normal.  Head: Atraumatic. Nose: No congestion/rhinnorhea. Mouth/Throat: Mucous membranes are moist.  Oropharynx non-erythematous. Neck: No stridor.   Cardiovascular: Normal rate, regular rhythm. Grossly normal heart sounds.  Good peripheral circulation. Respiratory: Normal respiratory effort.  No retractions. Lungs CTAB. Gastrointestinal: Soft and nontender. No distention. No abdominal bruits. No CVA tenderness.  There is some edema in the abdominal wall in the  lower abdomen Genitourinary: Marked swelling Musculoskeletal: No lower extremity tenderness 2+ edema to well above the knees.  Neurologic:  Normal speech and language. No gross focal neurologic deficits are appreciated. Skin:  Skin is warm, dry and intact. No rash noted. Psychiatric: Mood and affect are normal. Speech and behavior are normal.  ____________________________________________   LABS (all labs ordered are listed, but only abnormal results are displayed)  Labs Reviewed  COMPREHENSIVE METABOLIC PANEL - Abnormal; Notable for the following components:      Result Value   Chloride 95 (*)    BUN 66 (*)    Creatinine, Ser 3.19 (*)    Albumin 3.4 (*)    AST 57 (*)    GFR calc non Af Amer 19 (*)    GFR calc Af Amer 23 (*)    All other components within normal limits  TROPONIN I - Abnormal; Notable for the following components:   Troponin I 0.05 (*)    All other components within normal limits  BRAIN NATRIURETIC PEPTIDE - Abnormal; Notable for the following  components:   B Natriuretic Peptide 3,332.0 (*)    All other components within normal limits  CBC WITH DIFFERENTIAL/PLATELET - Abnormal; Notable for the following components:   Hemoglobin 12.3 (*)    HCT 38.4 (*)    MCV 72.3 (*)    MCH 23.2 (*)    RDW 29.1 (*)    Lymphs Abs 0.9 (*)    All other components within normal limits  URINALYSIS, COMPLETE (UACMP) WITH MICROSCOPIC - Abnormal; Notable for the following components:   Color, Urine YELLOW (*)    APPearance CLEAR (*)    Protein, ur 100 (*)    All other components within normal limits   ____________________________________________  EKG   ____________________________________________  RADIOLOGY  ED MD interpretation:    Official radiology report(s): Dg Chest 2 View  Result Date: 02/17/2018 CLINICAL DATA:  Dyspnea with swelling of the legs in general region region. Swelling x3 days. EXAM: CHEST - 2 VIEW COMPARISON:  02/03/2018 FINDINGS: Stable cardiomegaly with minimal aortic atherosclerosis. Mild vascular congestion is noted. ICD device projects over the left upper thorax with lead in the right ventricle. Bibasilar atelectasis and/or scarring is seen. Probable small right pleural effusion blunting cardiophrenic angle. No acute nor suspicious osseous abnormality. IMPRESSION: Cardiomegaly with slight increase in pulmonary vascular congestion since prior. Bibasilar atelectasis with probable small right pleural effusion. Findings likely represent a component of mild CHF. Electronically Signed   By: Tollie Eth M.D.   On: 02/17/2018 18:41   Dg Chest Portable 1 View  Result Date: 02/18/2018 CLINICAL DATA:  Pt has CHF. Hx of CHF, COPD, hypertension, MI, defibrillator. Non smoker EXAM: PORTABLE CHEST 1 VIEW COMPARISON:  Chest x-ray dated 02/17/2018 and chest x-ray dated 10/03/2017. FINDINGS: Cardiomegaly is grossly stable. Probable mild edema within the RIGHT perihilar lung. Lungs otherwise clear. No pleural effusion or pneumothorax seen.  LEFT chest wall ICD apparatus is stable in position. No acute or suspicious osseous finding. IMPRESSION: 1. Vague opacity within the RIGHT perihilar lung, most likely mild asymmetric edema related to the given history of CHF, alternatively fluid within the RIGHT minor fissure. Lungs otherwise clear. 2. Stable cardiomegaly. Electronically Signed   By: Bary Richard M.D.   On: 02/18/2018 16:52    ____________________________________________   PROCEDURES  Procedure(s) performed:   Procedures  Critical Care performed:   ____________________________________________   INITIAL IMPRESSION / ASSESSMENT AND PLAN / ED COURSE  We will give  the patient some more Lasix as he had yesterday, do a bladder scan to see how much urine is in his bladder and I expect that we will end up having to admit this gentleman  ----------------------------------------- 5:44 PM on 02/18/2018 -----------------------------------------  Patient only has 217 cc in his bladder.  His troponin is the same as it was yesterday and stable BNP is better but patient says he feels worse and is more short of breath.  We will see if we can get him in the hospital.        ____________________________________________   FINAL CLINICAL IMPRESSION(S) / ED DIAGNOSES  Final diagnoses:  Systolic congestive heart failure, unspecified HF chronicity [facility])     ED Discharge Orders    None       Note:  This document was prepared using Dragon voice recognition software and may include unintentional dictation errors.    Arnaldo Natal, MD 02/18/18 1745

## 2018-02-18 NOTE — Telephone Encounter (Signed)
Copied from CRM [id number]. Topic: Quick Communication - See Telephone Encounter >> Feb 18, 2018 12:29 PM Jay Schlichter wrote: CRM for notification. See Telephone encounter for: 02/18/18. Carley Tise case mgr from [facility] called - she needs to let us know the pt is interested in cardiac rehab.   Will pcp be able to put in referral for this or does the pt needs to go to a cardiologist? Please call back at [phone]

## 2018-02-19 ENCOUNTER — Encounter: Payer: Self-pay | Admitting: Nurse Practitioner

## 2018-02-19 ENCOUNTER — Encounter (HOSPITAL_COMMUNITY): Payer: Self-pay | Admitting: Internal Medicine

## 2018-02-19 DIAGNOSIS — I5023 Acute on chronic systolic (congestive) heart failure: Secondary | ICD-10-CM

## 2018-02-19 LAB — CBC
HCT: 40.3 % (ref 40.0–52.0)
Hemoglobin: 12.7 g/dL — ABNORMAL LOW (ref 13.0–18.0)
MCH: 22.8 pg — ABNORMAL LOW (ref 26.0–34.0)
MCHC: 31.6 g/dL — AB (ref 32.0–36.0)
MCV: 72.2 fL — ABNORMAL LOW (ref 80.0–100.0)
Platelets: 279 10*3/uL (ref 150–440)
RBC: 5.58 MIL/uL (ref 4.40–5.90)
RDW: 29.4 % — AB (ref 11.5–14.5)
WBC: 7.3 10*3/uL (ref 3.8–10.6)

## 2018-02-19 LAB — BASIC METABOLIC PANEL
ANION GAP: 14 (ref 5–15)
BUN: 71 mg/dL — ABNORMAL HIGH (ref 6–20)
CALCIUM: 8.7 mg/dL — AB (ref 8.9–10.3)
CO2: 22 mmol/L (ref 22–32)
Chloride: 97 mmol/L — ABNORMAL LOW (ref 101–111)
Creatinine, Ser: 3.38 mg/dL — ABNORMAL HIGH (ref 0.61–1.24)
GFR calc Af Amer: 21 mL/min — ABNORMAL LOW (ref >60)
GFR, EST NON AFRICAN AMERICAN: 18 mL/min — AB (ref >60)
GLUCOSE: 115 mg/dL — AB (ref 65–99)
Potassium: 4.1 mmol/L (ref 3.5–5.1)
SODIUM: 133 mmol/L — AB (ref 135–145)

## 2018-02-19 MED ORDER — METOPROLOL SUCCINATE ER 25 MG PO TB24
25.0000 mg | ORAL_TABLET | Freq: Every day | ORAL | Status: DC
  Administered 2018-02-19 – 2018-02-20 (×2): 25 mg via ORAL
  Filled 2018-02-19: qty 1

## 2018-02-19 MED ORDER — GUAIFENESIN-DM 100-10 MG/5ML PO SYRP
5.0000 mL | ORAL_SOLUTION | ORAL | Status: DC | PRN
  Administered 2018-02-19 – 2018-02-23 (×4): 5 mL via ORAL
  Filled 2018-02-19 (×5): qty 5

## 2018-02-19 NOTE — Plan of Care (Signed)
  Problem: Education: Goal: Knowledge of General Education information will improve Outcome: Progressing   Problem: Education: Goal: Ability to verbalize understanding of medication therapies will improve Outcome: Progressing   Problem: Activity: Goal: Capacity to carry out activities will improve Outcome: Progressing

## 2018-02-19 NOTE — Progress Notes (Signed)
24 hour urine started at 1130 after foley catheter placed. Orders given per Dr. Thedore Mins who was at the bedside. He feels patient is retaining urine. Dr. Thedore Mins present for bladder scan which showed . Patient did void around 73ml post bladder scan.

## 2018-02-19 NOTE — Telephone Encounter (Signed)
Called and left Carley a VM letting her know what Dr. Laural Benes said. Asked for her to call back with any other questions.

## 2018-02-19 NOTE — Progress Notes (Signed)
[employer] - [place] at [facility]   PATIENT NAME: Kelly Leonard    MR#:  [id number]  DATE OF BIRTH:  October 23, 1955  SUBJECTIVE:  CHIEF COMPLAINT:   Chief Complaint  Patient presents with  . Shortness of Breath  SOB REVIEW OF SYSTEMS:  Review of Systems  Constitutional: Negative for chills, fever and weight loss.  HENT: Negative for nosebleeds and sore throat.   Eyes: Negative for blurred vision.  Respiratory: Positive for shortness of breath. Negative for cough and wheezing.   Cardiovascular: Negative for chest pain, orthopnea, leg swelling and PND.  Gastrointestinal: Negative for abdominal pain, constipation, diarrhea, heartburn, nausea and vomiting.  Genitourinary: Negative for dysuria and urgency.  Musculoskeletal: Negative for back pain.  Skin: Negative for rash.  Neurological: Negative for dizziness, speech change, focal weakness and headaches.  Endo/Heme/Allergies: Does not bruise/bleed easily.  Psychiatric/Behavioral: Negative for depression.    DRUG ALLERGIES:   Allergies  Allergen Reactions  . Entresto [Sacubitril-Valsartan] Swelling    Swelling of the face  . Penicillins Anaphylaxis and Swelling    Has patient had a PCN reaction causing immediate rash, facial/tongue/throat swelling, SOB or lightheadedness with hypotension: Yes Has patient had a PCN reaction causing severe rash involving mucus membranes or skin necrosis: No Has patient had a PCN reaction that required hospitalization: No Has patient had a PCN reaction occurring within the last 10 years: No If all of the above answers are "NO", then may proceed with Cephalosporin use.   . Seroquel [Quetiapine] Nausea And Vomiting and Hives    Hives   . Ace Inhibitors Rash  . Corlanor [Ivabradine] Swelling    Facial swelling  . Ambien [Zolpidem Tartrate] Swelling  . Bidil [Isosorb Dinitrate-Hydralazine] Hives  . Carvedilol Other (See Comments)    "dizziness, light headed, and nausea"  .  Hydroxyzine Nausea Only  . Prednisone Other (See Comments)    Causes fluid build up  . Sertraline Diarrhea    itching  . Trazodone And Nefazodone Swelling and Other (See Comments)    "up for days" and lip swelling  . Uloric [Febuxostat] Other (See Comments)    Jittery, jerky, makes him lose his mind  . Benadryl [Diphenhydramine Hcl] Other (See Comments)    "makes me crazy and hyper"  . Gabapentin Swelling    Lip and face swelling  . Lorazepam Rash    Rash and severe anxiety  . Torsemide Hives, Itching, Nausea And Vomiting and Rash   VITALS:  Blood pressure (!) 135/110, pulse (!) 101, temperature (!) 97.3 F (36.3 C), temperature source Oral, resp. rate 18, SpO2 99 %. PHYSICAL EXAMINATION:  Physical Exam  Constitutional: He is oriented to person, place, and time.  HENT:  Head: Normocephalic and atraumatic.  Eyes: Pupils are equal, round, and reactive to light. Conjunctivae and EOM are normal.  Neck: Normal range of motion. Neck supple. No tracheal deviation present. No thyromegaly present.  Cardiovascular: Normal rate, regular rhythm and normal heart sounds.  Pulmonary/Chest: Effort normal and breath sounds normal. No respiratory distress. He has no wheezes. He exhibits no tenderness.  Abdominal: Soft. Bowel sounds are normal. He exhibits no distension. There is no tenderness.  Musculoskeletal: Normal range of motion.  Neurological: He is alert and oriented to person, place, and time. No cranial nerve deficit.  Skin: Skin is warm and dry. No rash noted.   LABORATORY PANEL:  Male CBC Recent Labs  Lab 02/19/18 0650  WBC 7.3  HGB 12.7*  HCT 40.3  PLT 279   ------------------------------------------------------------------------------------------------------------------ Chemistries  Recent Labs  Lab 02/18/18 1616 02/19/18 0650  NA 135 133*  K 4.7 4.1  CL 95* 97*  CO2 25 22  GLUCOSE 94 115*  BUN 66* 71*  CREATININE 3.19* 3.38*  CALCIUM 8.9 8.7*  AST 57*  --   ALT  28  --   ALKPHOS 104  --   BILITOT 1.2  --    RADIOLOGY:  No results found. ASSESSMENT AND PLAN:  62 year old male with past medical history of severe dilated cardiomyopathy EF of 20%, status post AICD, chronic systolic CHF, COPD, CKD stage IV, polycystic kidney disease admitted due to shortness of breath and worsening lower extremity edema.  1.  CHF-acute on chronic systolic dysfunction due to NICM:  Ef 20-25%.   Patient presents with significant volume overload and anasarca. - appreciate cardio input - Lasix drip, follow I's and O's and daily weights. - Continue Imdur, patient is refusing hydralazine as he thinks he's allergic to prednisone and hydralazine both.  2.  Chronic kidney disease stage IV-secondary to polycystic kidney disease.  Patient's creatinine is close to baseline.  Patient will get aggressive diuresis with IV Lasix.  Will follow renal function, nephrology seen  3.  Essential hypertension-continue Imdur, patient refused hydralazine  4.  COPD-no acute exacerbation-he's refusing prednisone - thinks he's allergic to it (he claims he gets rash from hydralazine and prednisone).  5.  Recent history of C. difficile colitis-no diarrhea presently.  Patient has finished treatment with oral vancomycin.  6.  Elevated troponin-secondary to supply demand ischemia from CKD.  No acute chest pain.       All the records are reviewed and case discussed with Care Management/Social Worker. Management plans discussed with the patient, cardio and they are in agreement.  CODE STATUS: Full Code  TOTAL TIME TAKING CARE OF THIS PATIENT: 35 minutes.   More than 50% of the time was spent in counseling/coordination of care: YES  POSSIBLE D/C IN 2-3 DAYS, DEPENDING ON CLINICAL CONDITION and cardiac eval.   Delfino Lovett M.D on 02/19/2018 at 6:55 PM  Between 7am to 6pm - Pager - [phone]  After 6pm go to [email or web address] - Social research officer, government  [facility]   Office  [phone]  CC: Primary care physician; Corky Downs, MD  Note: This dictation was prepared with Dragon dictation along with smaller phrase technology. Any transcriptional errors that result from this process are unintentional.

## 2018-02-19 NOTE — Consult Note (Signed)
Cardiology Consult    Patient ID: Kelly Leonard MRN: [id number], DOB/AGE: 1955/09/30   Admit date: 02/18/2018 Date of Consult: 02/19/2018  Primary Physician: Corky Downs, MD Primary Cardiologist: Arvilla Meres, MD Requesting Provider: Seth Bake. Sherryll Burger, MD  Patient Profile    Kelly Leonard is a 62 y.o. male with a history of NICM, HFrEF, CKD IV in the setting of polycystic kidney dzs, HTN, HL, anxiety/panic attacks, and COPD, who is being seen today for the evaluation of acute on chronic syst CHF at the request of Dr. Sherryll Burger.  Past Medical History   Past Medical History:  Diagnosis Date  . Anxiety   . Chronic systolic congestive heart failure (HCC)    a. 06/2017 Echo: EF 20-25%, diff HK. Mod to sev MR, mod dil LA. Mildly dil RA. PASP .  . CKD (chronic kidney disease) stage 4, GFR 15-29 ml/min (HCC)   . COPD (chronic obstructive pulmonary disease) (HCC)   . Hyperlipidemia   . Hypertension   . MI (myocardial infarction) (HCC)   . Mitral regurgitation    a. 06/2017 Echo: mod - sev MR.  Marland Kitchen NICM (nonischemic cardiomyopathy) (HCC)    a. 11/2005 MV: reversible inf ischemia, EF 32%; b. Notes indicate pt previously cathed - nl cors; c. 06/2015 s/p MDT [id number] Visia AF MRI VR single lead AICD ([facility]); d. 06/2017 Echo: EF 20-25%.  . Panic attacks   . Polycystic kidney    a. w/ CKD IV.    Past Surgical History:  Procedure Laterality Date  . CARDIAC CATHETERIZATION    . IMPLANTABLE CARDIOVERTER DEFIBRILLATOR (ICD) GENERATOR CHANGE Right 06/23/2015   Procedure: ICD GENERATOR  INITIAL IMPLANT;  Surgeon: Sharion Settler, MD;  Location: [facility] ORS;  Service: Cardiovascular;  Laterality: Right;     Allergies  Allergies  Allergen Reactions  . Entresto [Sacubitril-Valsartan] Swelling    Swelling of the face  . Penicillins Anaphylaxis and Swelling    Has patient had a PCN reaction causing immediate rash, facial/tongue/throat swelling, SOB or lightheadedness with hypotension: Yes Has  patient had a PCN reaction causing severe rash involving mucus membranes or skin necrosis: No Has patient had a PCN reaction that required hospitalization: No Has patient had a PCN reaction occurring within the last 10 years: No If all of the above answers are "NO", then may proceed with Cephalosporin use.   . Seroquel [Quetiapine] Nausea And Vomiting and Hives    Hives   . Ace Inhibitors Rash  . Corlanor [Ivabradine] Swelling    Facial swelling  . Ambien [Zolpidem Tartrate] Swelling  . Bidil [Isosorb Dinitrate-Hydralazine] Hives  . Carvedilol Other (See Comments)    "dizziness, light headed, and nausea"  . Hydroxyzine Nausea Only  . Prednisone Other (See Comments)    Causes fluid build up  . Sertraline Diarrhea    itching  . Trazodone And Nefazodone Swelling and Other (See Comments)    "up for days" and lip swelling  . Uloric [Febuxostat] Other (See Comments)    Jittery, jerky, makes him lose his mind  . Benadryl [Diphenhydramine Hcl] Other (See Comments)    "makes me crazy and hyper"  . Gabapentin Swelling    Lip and face swelling  . Lorazepam Rash    Rash and severe anxiety  . Torsemide Hives, Itching, Nausea And Vomiting and Rash    History of Present Illness    62 year old male with a history of nonischemic cardiomyopathy, HFrEF, stage IV chronic kidney disease in the setting of polycystic kidneys, hypertension, hyperlipidemia,  anxiety/panic attacks, and COPD.  History of cardiomyopathy dates back at least 10 years.  Stress testing in 2007 showed an LVEF of 32% with inf ischemia.  Notes indicate the patient underwent diagnostic catheterization at some point, which reportedly showed normal coronary arteries.  He had previously been followed at both [facility] and then [facility].  Multiple medication intolerances have been limited anyone's ability to place him on guidelines based therapy.  He he is status post single lead ICD placement in October 2016, performed at [facility].  Over the past  year, he has had multiple ER visits and several admissions related to worsening volume overload and dyspnea.  He was admitted to [facility] in May with volume overload.  During that admission, he underwent a right heart catheterization revealing a cardiac output of 2.7 with an index of 1.6.  He was on dobutamine for a short period of time.  That admission was also complicated by worsening renal failure with a creatinine as high as 6.  Fortunately, he responded well to IV diuretics and did not require dialysis.  He was diuresed down to 123 pounds by discharge.  He was sent home on Bumex 3 mg daily and advised that he may take an additional 3 mg for weight gain.  As he previously felt that his dry weight was 138 pounds, he did not become concerned until his weight went above 140 pounds, which it had a few weeks ago, prompting him to double up on his outpatient Bumex.  In that setting,  noted worsening lower extremity edema, scrotal edema, inc abd girth, and scrotal edema.  He has been seen in the ER on several occasions including May 28, and June 11, both times for worsening dyspnea and lower extremity edema.  On both occasions, he was treated with IV Lasix and discharged home.  Unfortunately, he re-presented to the emergency department on June 12 secondary to scrotal and penile swelling with difficulty urinating.  Chest x-ray showed worsening edema.  ECG without acute changes.  Creatinine stable at 3.19.  BNP 3332.  Troponin 0.05.  He was admitted by internal medicine and placed on IV Lasix infusion.  Thus far, he has had minimal improvement in volume status.  He is still somewhat dyspneic sitting up in bed but just had a Foley placed and says he is feeling very anxious.  No chest pain.  Inpatient Medications    . cholecalciferol  1,000 Units Oral Daily  . heparin  5,000 Units Subcutaneous Q8H  . isosorbide-hydrALAZINE  2 tablet Oral TID  . mouth rinse  15 mL Mouth Rinse BID  . predniSONE  5 mg Oral Daily  .  triamcinolone ointment  1 application Topical BID    Family History    Family History  Problem Relation Age of Onset  . Diabetes Mother   . Hypertension Brother   . Heart disease Maternal Grandmother   . Heart attack Maternal Grandmother    indicated that his mother is alive. He indicated that his father is deceased. He indicated that his sister is alive. He indicated that his brother is alive. He indicated that his maternal grandmother is deceased. He indicated that his maternal grandfather is deceased. He indicated that his paternal grandmother is deceased. He indicated that his paternal grandfather is deceased. He indicated that both of his daughters are alive. He indicated that his son is alive.   Social History    Social History   Socioeconomic History  . Marital status: Married  Spouse name: Not on file  . Number of children: Not on file  . Years of education: Not on file  . Highest education level: Not on file  Occupational History  . Not on file  Social Needs  . Financial resource strain: Not on file  . Food insecurity:    Worry: Not on file    Inability: Not on file  . Transportation needs:    Medical: Not on file    Non-medical: Not on file  Tobacco Use  . Smoking status: Never Smoker  . Smokeless tobacco: Never Used  Substance and Sexual Activity  . Alcohol use: No  . Drug use: No  . Sexual activity: Yes  Lifestyle  . Physical activity:    Days per week: Not on file    Minutes per session: Not on file  . Stress: Not on file  Relationships  . Social connections:    Talks on phone: Not on file    Gets together: Not on file    Attends religious service: Not on file    Active member of club or organization: Not on file    Attends meetings of clubs or organizations: Not on file    Relationship status: Not on file  . Intimate partner violence:    Fear of current or ex partner: Not on file    Emotionally abused: Not on file    Physically abused: Not on  file    Forced sexual activity: Not on file  Other Topics Concern  . Not on file  Social History Narrative   Lives locally with wife.     Review of Systems    General:  No chills, fever, night sweats or weight changes.  Cardiovascular:  No chest pain, +++ dyspnea on exertion, +++ bilat LE/scrotal/abd edema, +++ orthopnea, no palpitations, +++ paroxysmal nocturnal dyspnea/restlessness. Dermatological: No rash, lesions/masses Respiratory: No cough, +++ dyspnea Urologic: No hematuria, dysuria Abdominal:   Increased abd girth 2/ flank edema.  No nausea, vomiting, diarrhea, bright red blood per rectum, melena, or hematemesis Neurologic:  No visual changes, wkns, changes in mental status. All other systems reviewed and are otherwise negative except as noted above.  Physical Exam    Blood pressure (!) 131/101, pulse (!) 103, temperature (!) 97.4 F (36.3 C), temperature source Oral, resp. rate 18, SpO2 100 %.  General: Pleasant, anxious.  Initially dyspneic but was able to calm himself down after sitting up. Wife and grand-daughter @ bedside. Psych: flat affect. Neuro: Alert and oriented X 3. Moves all extremities spontaneously. HEENT: Normal  Neck: Supple without bruits. JVD to jaw. Lungs:  Resp regular and unlabored, crackles 2/3 way up bilat. Heart: RRR + S3 gallop. Abdomen: Firm, protuberant, diffusely tender to light palpation. BS + x 4. Flank edema bilat. Extremities: No clubbing, cyanosis.  3+ bilat LEE to thighs. DP/PT/Radials 2+ and equal bilaterally.  Labs   Recent Labs    02/17/18 1738 02/18/18 1616  TROPONINI 0.05* 0.05*   Lab Results  Component Value Date   WBC 7.3 02/19/2018   HGB 12.7 (L) 02/19/2018   HCT 40.3 02/19/2018   MCV 72.2 (L) 02/19/2018   PLT 279 02/19/2018    Recent Labs  Lab 02/18/18 1616 02/19/18 0650  NA 135 133*  K 4.7 4.1  CL 95* 97*  CO2 25 22  BUN 66* 71*  CREATININE 3.19* 3.38*  CALCIUM 8.9 8.7*  PROT 7.4  --   BILITOT 1.2  --     [facility]  104  --   ALT 28  --   AST 57*  --   GLUCOSE 94 115*   Radiology Studies     Dg Chest 2 View  Result Date: 02/17/2018 CLINICAL DATA:  Dyspnea with swelling of the legs in general region region. Swelling x3 days. EXAM: CHEST - 2 VIEW COMPARISON:  02/03/2018 FINDINGS: Stable cardiomegaly with minimal aortic atherosclerosis. Mild vascular congestion is noted. ICD device projects over the left upper thorax with lead in the right ventricle. Bibasilar atelectasis and/or scarring is seen. Probable small right pleural effusion blunting cardiophrenic angle. No acute nor suspicious osseous abnormality. IMPRESSION: Cardiomegaly with slight increase in pulmonary vascular congestion since prior. Bibasilar atelectasis with probable small right pleural effusion. Findings likely represent a component of mild CHF. Electronically Signed   By: Tollie Eth M.D.   On: 02/17/2018 18:41   Dg Chest Portable 1 View  Result Date: 02/18/2018 CLINICAL DATA:  Pt has CHF. Hx of CHF, COPD, hypertension, MI, defibrillator. Non smoker EXAM: PORTABLE CHEST 1 VIEW COMPARISON:  Chest x-ray dated 02/17/2018 and chest x-ray dated 10/03/2017. FINDINGS: Cardiomegaly is grossly stable. Probable mild edema within the RIGHT perihilar lung. Lungs otherwise clear. No pleural effusion or pneumothorax seen. LEFT chest wall ICD apparatus is stable in position. No acute or suspicious osseous finding. IMPRESSION: 1. Vague opacity within the RIGHT perihilar lung, most likely mild asymmetric edema related to the given history of CHF, alternatively fluid within the RIGHT minor fissure. Lungs otherwise clear. 2. Stable cardiomegaly. Electronically Signed   By: Bary Richard M.D.   On: 02/18/2018 16:52    ECG & Cardiac Imaging    RSR, 99, rightward axis, BAE, antlat infarct.  Assessment & Plan    1.  Acute on chronic systolic CHF/NICM:  Ef 20-25%.  S/p admission to [facility] in May in the setting of CHF and worsening renal failure w/ uremia  (peak BUN/Creat of 170/6.07).  He required a brief course of IV dobutamine after RHC showed CO/CI of 2.7/1.6 respectively.  He did not require HD and was eventually d/c'd on bumex 3mg  daily and advised to take an additional 3mg  for wt gain and/or swelling.  D/c wt was 123 lbs.  Pt and wife thought that his dry wt should be 138 lbs, and so they allowed for his weight to rise and only started taking bumex bid once wt climbed above 138.  He was seen in the ED in late May and twice this week due to worsening dyspnea, edema, inc abd girth, orthopnea, and restlessness, and admitted yesterday.  He is massively volume overloaded with JVD to his jaw and an S3.  Agree with IV lasix infusion and renal consult.  Med mgmt limited by multiple intolerances and renal failure.  Cont bidil and watch closely as although he's been on it for about a year, his wife says that he recently developed erythema and rash after doses.  If he develops cardiorenal syndrome in the setting of diuresis, he will likely require another trial of dobutamine vs HD.  2.  CKD IV:  Relatively stable. Follow with diuresis.  Nephrology to follow.  3.  Essential HTN:  Cont bidil/diuresis.  Signed, Nicolasa Ducking, NP 02/19/2018, 12:13 PM  For questions or updates, please contact   Please consult [email or web address] for contact info under Cardiology/STEMI.

## 2018-02-19 NOTE — Progress Notes (Signed)
[facility], [place] 02/19/18  Subjective:   Patient is known to our practice from outpatient follow-up He presented to the emergency room via EMS for shortness of breath, worsening lower extremity edema, increasing swelling in the scrotal area. He was given IV furosemide.  Patient feels like that has helped his edema   Objective:  Vital signs in last 24 hours:  Temp:  [97.4 F (36.3 C)-97.8 F (36.6 C)] 97.4 F (36.3 C) (06/13 0751) Pulse Rate:  [102-104] 103 (06/13 0751) Resp:  [18-20] 18 (06/13 0344) BP: (115-134)/(82-101) 131/101 (06/13 0751) SpO2:  [68 %-100 %] 100 % (06/13 0751)  Weight change:  There were no vitals filed for this visit.  Intake/Output:    Intake/Output Summary (Last 24 hours) at 02/19/2018 0958 Last data filed at 02/19/2018 0502 Gross per 24 hour  Intake 61.83 ml  Output 322 ml  Net -260.17 ml     Physical Exam: General:  No acute distress, laying in the bed  HEENT  moist mucous membranes, nasal cannula oxygen  Neck  supple  Pulm/lungs  mild basilar crackles  CVS/Heart  irregular rhythm  Abdomen:   Distended, nontender  Extremities:  2-3+ edema  Neurologic:  Alert, able to follow commands             Basic Metabolic Panel:  Recent Labs  Lab 02/17/18 1738 02/18/18 1616 02/19/18 0650  NA 137 135 133*  K 3.8 4.7 4.1  CL 99* 95* 97*  CO2 26 25 22   GLUCOSE 95 94 115*  BUN 58* 66* 71*  CREATININE 3.13* 3.19* 3.38*  CALCIUM 9.1 8.9 8.7*     CBC: Recent Labs  Lab 02/17/18 1738 02/18/18 1616 02/19/18 0650  WBC 6.2 7.0 7.3  NEUTROABS  --  5.4  --   HGB 11.3* 12.3* 12.7*  HCT 35.5* 38.4* 40.3  MCV 71.6* 72.3* 72.2*  PLT 281 262 279      Lab Results  Component Value Date   HEPBSAG Negative 07/08/2017   HEPBIGM Negative 07/08/2017      Microbiology:  No results found for this or any previous visit (from the past 240 hour(s)).  Coagulation Studies: No results for input(s): LABPROT, INR  in the last 72 hours.  Urinalysis: Recent Labs    02/18/18 1616  COLORURINE YELLOW*  LABSPEC 1.008  PHURINE 6.0  GLUCOSEU NEGATIVE  HGBUR NEGATIVE  BILIRUBINUR NEGATIVE  KETONESUR NEGATIVE  PROTEINUR 100*  NITRITE NEGATIVE  LEUKOCYTESUR NEGATIVE      Imaging: Dg Chest 2 View  Result Date: 02/17/2018 CLINICAL DATA:  Dyspnea with swelling of the legs in general region region. Swelling x3 days. EXAM: CHEST - 2 VIEW COMPARISON:  02/03/2018 FINDINGS: Stable cardiomegaly with minimal aortic atherosclerosis. Mild vascular congestion is noted. ICD device projects over the left upper thorax with lead in the right ventricle. Bibasilar atelectasis and/or scarring is seen. Probable small right pleural effusion blunting cardiophrenic angle. No acute nor suspicious osseous abnormality. IMPRESSION: Cardiomegaly with slight increase in pulmonary vascular congestion since prior. Bibasilar atelectasis with probable small right pleural effusion. Findings likely represent a component of mild CHF. Electronically Signed   By: Tollie Eth M.D.   On: 02/17/2018 18:41   Dg Chest Portable 1 View  Result Date: 02/18/2018 CLINICAL DATA:  Pt has CHF. Hx of CHF, COPD, hypertension, MI, defibrillator. Non smoker EXAM: PORTABLE CHEST 1 VIEW COMPARISON:  Chest x-ray dated 02/17/2018 and chest x-ray dated 10/03/2017. FINDINGS: Cardiomegaly is grossly stable. Probable mild edema  within the RIGHT perihilar lung. Lungs otherwise clear. No pleural effusion or pneumothorax seen. LEFT chest wall ICD apparatus is stable in position. No acute or suspicious osseous finding. IMPRESSION: 1. Vague opacity within the RIGHT perihilar lung, most likely mild asymmetric edema related to the given history of CHF, alternatively fluid within the RIGHT minor fissure. Lungs otherwise clear. 2. Stable cardiomegaly. Electronically Signed   By: Bary Richard M.D.   On: 02/18/2018 16:52     Medications:   . furosemide (LASIX) infusion 10 mg/hr  (02/18/18 2251)   . cholecalciferol  1,000 Units Oral Daily  . heparin  5,000 Units Subcutaneous Q8H  . isosorbide-hydrALAZINE  2 tablet Oral TID  . mouth rinse  15 mL Mouth Rinse BID  . predniSONE  5 mg Oral Daily  . triamcinolone ointment  1 application Topical BID   acetaminophen **OR** acetaminophen, chlordiazePOXIDE, guaiFENesin-dextromethorphan, ondansetron **OR** ondansetron (ZOFRAN) IV  Assessment/ Plan:  62 y.o. African-American male with chronic systolic congestive heart failure, ICD, COPD, hypertension, polycystic kidney disease, chronic kidney disease now presents with worsening shortness of breath and volume overload. LVEF 20-25 % from 06/2017  1.  Chronic kidney disease stage IV.  GFR 21.   2.  Generalized edema 3.  ?  Urinary retention 4.  Chronic systolic congestive heart failure  Patient has advanced renal dysfunction.  He has now volume overload.  Recently he has been hospitalized many times.  We will obtain a 24-hour urine for creatinine clearance to have better idea about his renal clearance.  Currently getting furosemide infusion.  He had greater than 250 mL on bladder scan.  We will continue to monitor closely.  Based on 24-hour urine results we will  assess how close he is to needing dialysis.   LOS: 1 Kelly Leonard 6/13/20199:58 AM  [facility] [place], [place] [phone]  Note: This note was prepared with Dragon dictation. Any transcription errors are unintentional

## 2018-02-19 NOTE — Progress Notes (Addendum)
Pt voided atleast 100 ml. Pt was bladder scan and showed a 264. Will continue to monitor.  Update 0425: Doctor Sheryle Hail made aware. No new order was place. Just continue to monitor.   Update 0445: Pt complained of couging. Robitussin DM PRN every 4 hours was place (standing order). Will continue to monitor. At 0049 Robitussin was administered. Will continue to monitor.

## 2018-02-19 NOTE — Care Management Note (Signed)
Case Management Note  Patient Details  Name: Kelly Leonard MRN: [id number] Date of Birth: 11-01-1955  Subjective/Objective:    Admitted to [facility] with the diagnosis of congestive heart failure. Lives with wife, Luster Landsberg [phone]). Dr. Juel Burrow is listed as primary care physician. Sees Dr. Cassie Freer for cardiology and Dr. Glenna Fellows for nephrology. No home health. No skilled nursing. No home oxygen.Takes care of all basic activities of daily living himself. Twenty pound weight gain over the last few weeks. Fair appetite.  Family/friend will transport               Action/Plan: Will continue to follow for discharge plans   Expected Discharge Date:                  Expected Discharge Plan:     In-House Referral:     Discharge planning Services     Post Acute Care Choice:    Choice offered to:     DME Arranged:    DME Agency:     HH Arranged:    HH Agency:     Status of Service:     If discussed at [employer], dates discussed:    Additional Comments:  Gwenette Greet, RN MSN CCM Care Management [phone] 02/19/2018, 1:25 PM

## 2018-02-19 NOTE — Progress Notes (Signed)
Pt was admitted on the floor without any sign of distress. VSS. Will continue to monitor.

## 2018-02-20 ENCOUNTER — Inpatient Hospital Stay: Payer: Self-pay

## 2018-02-20 ENCOUNTER — Other Ambulatory Visit: Payer: Self-pay

## 2018-02-20 ENCOUNTER — Encounter: Payer: Self-pay | Admitting: *Deleted

## 2018-02-20 DIAGNOSIS — I5043 Acute on chronic combined systolic (congestive) and diastolic (congestive) heart failure: Secondary | ICD-10-CM

## 2018-02-20 LAB — BASIC METABOLIC PANEL
ANION GAP: 18 — AB (ref 5–15)
BUN: 76 mg/dL — ABNORMAL HIGH (ref 6–20)
CALCIUM: 8.8 mg/dL — AB (ref 8.9–10.3)
CO2: 19 mmol/L — ABNORMAL LOW (ref 22–32)
Chloride: 97 mmol/L — ABNORMAL LOW (ref 101–111)
Creatinine, Ser: 3.62 mg/dL — ABNORMAL HIGH (ref 0.61–1.24)
GFR, EST AFRICAN AMERICAN: 19 mL/min — AB (ref >60)
GFR, EST NON AFRICAN AMERICAN: 17 mL/min — AB (ref >60)
Glucose, Bld: 89 mg/dL (ref 65–99)
Potassium: 4.4 mmol/L (ref 3.5–5.1)
Sodium: 134 mmol/L — ABNORMAL LOW (ref 135–145)

## 2018-02-20 LAB — CBC
HEMATOCRIT: 39.8 % — AB (ref 40.0–52.0)
Hemoglobin: 12.7 g/dL — ABNORMAL LOW (ref 13.0–18.0)
MCH: 22.9 pg — ABNORMAL LOW (ref 26.0–34.0)
MCHC: 32 g/dL (ref 32.0–36.0)
MCV: 71.5 fL — ABNORMAL LOW (ref 80.0–100.0)
Platelets: 314 10*3/uL (ref 150–440)
RBC: 5.56 MIL/uL (ref 4.40–5.90)
RDW: 27.9 % — AB (ref 11.5–14.5)
WBC: 7.7 10*3/uL (ref 3.8–10.6)

## 2018-02-20 LAB — CREATININE CLEARANCE, URINE, 24 HOUR
CREAT CLEAR: 6 mL/min — AB (ref 75–125)
Collection Interval-CRCL: 24 hours
Creatinine, 24H Ur: 333 mg/d — ABNORMAL LOW (ref 800–2000)
Creatinine, Urine: 133 mg/dL
Urine Total Volume-CRCL: 250 mL

## 2018-02-20 LAB — MRSA PCR SCREENING: MRSA by PCR: NEGATIVE

## 2018-02-20 MED ORDER — NEPRO/CARBSTEADY PO LIQD
237.0000 mL | ORAL | Status: DC
  Administered 2018-02-20 – 2018-02-22 (×3): 237 mL via ORAL

## 2018-02-20 MED ORDER — TAMSULOSIN HCL 0.4 MG PO CAPS
0.4000 mg | ORAL_CAPSULE | Freq: Every day | ORAL | Status: DC
  Administered 2018-02-20 – 2018-02-23 (×4): 0.4 mg via ORAL
  Filled 2018-02-20 (×4): qty 1

## 2018-02-20 MED ORDER — DOBUTAMINE IN D5W 4-5 MG/ML-% IV SOLN
5.0000 ug/kg/min | INTRAVENOUS | Status: DC
  Administered 2018-02-20: 5 ug/kg/min via INTRAVENOUS
  Filled 2018-02-20 (×4): qty 250

## 2018-02-20 NOTE — Progress Notes (Signed)
Order for IJ PICC entered for patient. Notified Primary RN that IV Team cannot place IJ PICC's.

## 2018-02-20 NOTE — Progress Notes (Signed)
Called and gave report to the ICU nurse for patient transfer. Will transfer now with the help of Mykia, NT. Jari Favre [facility]

## 2018-02-20 NOTE — Progress Notes (Signed)
[facility], [place] 02/20/18  Subjective:   Patient is known to our practice from outpatient follow-up He presented to the emergency room via EMS for shortness of breath, worsening lower extremity edema, increasing swelling in the scrotal area. He was given IV furosemide.  Patient feels like that has helped his edema  He now has foley for urinary retention On Lasix drip Some diuresis    Objective:  Vital signs in last 24 hours:  Temp:  [97.3 F (36.3 C)-98.3 F (36.8 C)] 98.3 F (36.8 C) (06/14 0832) Pulse Rate:  [91-101] 93 (06/14 0832) Resp:  [17-18] 18 (06/14 0832) BP: (115-135)/(87-110) 115/87 (06/14 0832) SpO2:  [93 %-100 %] 93 % (06/14 0832)  Weight change:  There were no vitals filed for this visit.  Intake/Output:    Intake/Output Summary (Last 24 hours) at 02/20/2018 1107 Last data filed at 02/20/2018 0649 Gross per 24 hour  Intake 473 ml  Output 665 ml  Net -192 ml     Physical Exam: General:  No acute distress, laying in the bed  HEENT  moist mucous membranes, nasal cannula oxygen  Neck  supple  Pulm/lungs  clear today  CVS/Heart  irregular rhythm  Abdomen:   Distended, nontender  Extremities:  2-3+ edema b.l  Neurologic:  Alert, able to follow commands             Basic Metabolic Panel:  Recent Labs  Lab 02/17/18 1738 02/18/18 1616 02/19/18 0650 02/20/18 0450  NA 137 135 133* 134*  K 3.8 4.7 4.1 4.4  CL 99* 95* 97* 97*  CO2 26 25 22  19*  GLUCOSE 95 94 115* 89  BUN 58* 66* 71* 76*  CREATININE 3.13* 3.19* 3.38* 3.62*  CALCIUM 9.1 8.9 8.7* 8.8*     CBC: Recent Labs  Lab 02/17/18 1738 02/18/18 1616 02/19/18 0650 02/20/18 0450  WBC 6.2 7.0 7.3 7.7  NEUTROABS  --  5.4  --   --   HGB 11.3* 12.3* 12.7* 12.7*  HCT 35.5* 38.4* 40.3 39.8*  MCV 71.6* 72.3* 72.2* 71.5*  PLT 281 262 279 314      Lab Results  Component Value Date   HEPBSAG Negative 07/08/2017   HEPBIGM Negative 07/08/2017       Microbiology:  No results found for this or any previous visit (from the past 240 hour(s)).  Coagulation Studies: No results for input(s): LABPROT, INR in the last 72 hours.  Urinalysis: Recent Labs    02/18/18 1616  COLORURINE YELLOW*  LABSPEC 1.008  PHURINE 6.0  GLUCOSEU NEGATIVE  HGBUR NEGATIVE  BILIRUBINUR NEGATIVE  KETONESUR NEGATIVE  PROTEINUR 100*  NITRITE NEGATIVE  LEUKOCYTESUR NEGATIVE      Imaging: Dg Chest Portable 1 View  Result Date: 02/18/2018 CLINICAL DATA:  Pt has CHF. Hx of CHF, COPD, hypertension, MI, defibrillator. Non smoker EXAM: PORTABLE CHEST 1 VIEW COMPARISON:  Chest x-ray dated 02/17/2018 and chest x-ray dated 10/03/2017. FINDINGS: Cardiomegaly is grossly stable. Probable mild edema within the RIGHT perihilar lung. Lungs otherwise clear. No pleural effusion or pneumothorax seen. LEFT chest wall ICD apparatus is stable in position. No acute or suspicious osseous finding. IMPRESSION: 1. Vague opacity within the RIGHT perihilar lung, most likely mild asymmetric edema related to the given history of CHF, alternatively fluid within the RIGHT minor fissure. Lungs otherwise clear. 2. Stable cardiomegaly. Electronically Signed   By: Bary Richard M.D.   On: 02/18/2018 16:52     Medications:   . furosemide (LASIX)  infusion 10 mg/hr (02/20/18 0314)   . cholecalciferol  1,000 Units Oral Daily  . heparin  5,000 Units Subcutaneous Q8H  . mouth rinse  15 mL Mouth Rinse BID  . metoprolol succinate  25 mg Oral Daily  . triamcinolone ointment  1 application Topical BID   acetaminophen **OR** acetaminophen, chlordiazePOXIDE, guaiFENesin-dextromethorphan, ondansetron **OR** ondansetron (ZOFRAN) IV  Assessment/ Plan:  62 y.o. African-American male with chronic systolic congestive heart failure, ICD, COPD, hypertension, polycystic kidney disease, chronic kidney disease now presents with worsening shortness of breath and volume overload. LVEF 20-25 % from  06/2017  1.  Chronic kidney disease stage IV.  GFR 21.   2.  Generalized edema 3.  Urinary retention 4.  Chronic systolic congestive heart failure  Patient has advanced renal dysfunction.  He has now volume overload.  Recently he has been hospitalized many times.  We will obtain a 24-hour urine for creatinine clearance to have better idea about his renal clearance.  Currently getting furosemide infusion.  Based on 24-hour urine results we will  assess how close he is to needing dialysis.  Flomax Nepro    LOS: 2 Assata Juncaj Thedore Mins 6/14/201911:07 AM  [facility] [place], [place] [phone]  Note: This note was prepared with Dragon dictation. Any transcription errors are unintentional

## 2018-02-20 NOTE — Plan of Care (Signed)
  Problem: Education: Goal: Knowledge of General Education information will improve Outcome: Progressing   Problem: Elimination: Goal: Will not experience complications related to bowel motility Outcome: Progressing   Problem: Education: Goal: Ability to verbalize understanding of medication therapies will improve Outcome: Progressing   Problem: Activity: Goal: Capacity to carry out activities will improve Outcome: Progressing

## 2018-02-20 NOTE — Progress Notes (Signed)
Called IV team, no answer 

## 2018-02-20 NOTE — Progress Notes (Signed)
Notified by IV Team that they cannot place IJ PICC lines. Asked if they could come by and start PIVs in order to start dobutamine drip.  They will come by to attempt PIVs for now.  Will address PICC line at a later time.    Report given to [facility], Charity fundraiser.

## 2018-02-20 NOTE — Consult Note (Signed)
Reason for Consult:to assist with critical care management Referring Physician: Dr. [name] is an 62 y.o. male.  HPI: Kelly Leonard is a very pleasant 62 year old African-American male with a past medical history remarkable for hypertension, hyperlipidemia, COPD, polycystic kidney disease with stage IV renal failuregeneralized anxiety disorder severe cardiomyopathy with last measured ejection fraction at 20%,status post AICD, moderate to severe mitral regurgitation, pulmonary artery pressures measured at 50, presented to the emergency department on 6/11 with significant weight gain along with shortness of breath, PND and orthopnea. Patient has been treated on telemetry with Lasix infusion, seen by cardiology, being transferred to the intensive care unit for dobutamine infusion to help assist with diuresis.  Past Medical History:  Diagnosis Date  . Anxiety   . Bilateral renal masses    a. 01/2018 - noted on CT w/ rec for MRI to better eval.  . Chronic systolic congestive heart failure ([place])    a. 06/2017 Echo: EF 20-25%, diff HK. Mod to sev MR, mod dil LA. Mildly dil RA. PASP 42mHg.  . CKD (chronic kidney disease) stage 4, GFR 15-29 ml/min (HCC)   . COPD ([place] obstructive pulmonary disease) ([place]   . Hyperlipidemia   . Hypertension   . MI [place]ocardial infarction) ([place]   . Mitral regurgitation    a. 06/2017 Echo: mod - sev MR.  .Marland KitchenNICM (nonischemic cardiomyopathy) ([place]    a. 11/2005 MV: reversible inf ischemia, EF 32%; b. Notes indicate pt previously cathed - nl cors; c. 06/2015 s/p MDT [id number] AF MRI VR single lead AICD ([facility]); d. 06/2017 Echo: EF 20-25%.  . Panic attacks   . Polycystic kidney    a. w/ CKD IV.    Past Surgical History:  Procedure Laterality Date  . CARDIAC CATHETERIZATION    . IMPLANTABLE CARDIOVERTER DEFIBRILLATOR (ICD) GENERATOR CHANGE Right 06/23/2015   Procedure: ICD GENERATOR  INITIAL IMPLANT;  Surgeon: KMarzetta Board MD;  Location: [facility] ORS;   Service: Cardiovascular;  Laterality: Right;    Family History  Problem Relation Age of Onset  . Diabetes Mother   . Hypertension Brother   . Heart disease Maternal Grandmother   . Heart attack Maternal Grandmother     Social History:  reports that he has never smoked. He has never used smokeless tobacco. He reports that he does not drink alcohol or use drugs.  Allergies:  Allergies  Allergen Reactions  . Entresto [Sacubitril-Valsartan] Swelling    Swelling of the face  . Penicillins Anaphylaxis and Swelling    Has patient had a PCN reaction causing immediate rash, facial/tongue/throat swelling, SOB or lightheadedness with hypotension: Yes Has patient had a PCN reaction causing severe rash involving mucus membranes or skin necrosis: No Has patient had a PCN reaction that required hospitalization: No Has patient had a PCN reaction occurring within the last 10 years: No If all of the above answers are "NO", then may proceed with Cephalosporin use.   . Seroquel [Quetiapine] Nausea And Vomiting and Hives    Hives   . Ace Inhibitors Rash  . Corlanor [Ivabradine] Swelling    Facial swelling  . Ambien [Zolpidem Tartrate] Swelling  . Bidil [Isosorb Dinitrate-Hydralazine] Hives  . Carvedilol Other (See Comments)    "dizziness, light headed, and nausea"  . Hydroxyzine Nausea Only  . Prednisone Other (See Comments)    Causes fluid build up  . Sertraline Diarrhea    itching  . Trazodone And Nefazodone Swelling and Other (See Comments)    "  up for days" and lip swelling  . Uloric [Febuxostat] Other (See Comments)    Jittery, jerky, makes him lose his mind  . Benadryl [Diphenhydramine Hcl] Other (See Comments)    "makes me crazy and hyper"  . Gabapentin Swelling    Lip and face swelling  . Lorazepam Rash    Rash and severe anxiety  . Torsemide Hives, Itching, Nausea And Vomiting and Rash    Medications: I have reviewed the patient's current medications.  Results for orders  placed or performed during the hospital encounter of 02/18/18 (from the past 48 hour(s))  Comprehensive metabolic panel     Status: Abnormal   Collection Time: 02/18/18  4:16 PM  Result Value Ref Range   Sodium 135 135 - 145 mmol/L   Potassium 4.7 3.5 - 5.1 mmol/L    Comment: HEMOLYSIS AT THIS LEVEL MAY AFFECT RESULT   Chloride 95 (L) 101 - 111 mmol/L   CO2 25 22 - 32 mmol/L   Glucose, Bld 94 65 - 99 mg/dL   BUN 66 (H) 6 - 20 mg/dL   Creatinine, Ser 3.19 (H) 0.61 - 1.24 mg/dL   Calcium 8.9 8.9 - 10.3 mg/dL   Total Protein 7.4 6.5 - 8.1 g/dL   Albumin 3.4 (L) 3.5 - 5.0 g/dL   AST 57 (H) 15 - 41 U/L   ALT 28 17 - 63 U/L   Alkaline Phosphatase 104 38 - 126 U/L   Total Bilirubin 1.2 0.3 - 1.2 mg/dL   GFR calc non Af Amer 19 (L) >60 mL/min   GFR calc Af Amer 23 (L) >60 mL/min    Comment: (NOTE) The eGFR has been calculated using the CKD EPI equation. This calculation has not been validated in all clinical situations. eGFR's persistently <60 mL/min signify possible Chronic Kidney Disease.    Anion gap 15 5 - 15    Comment: Performed at [facility], [address] [place], [place] [address]  Troponin I     Status: Abnormal   Collection Time: 02/18/18  4:16 PM  Result Value Ref Range   Troponin I 0.05 (HH) <0.03 ng/mL    Comment: CRITICAL VALUE NOTED. VALUE IS CONSISTENT WITH PREVIOUSLY REPORTED/CALLED VALUE KLW Performed at [facility], [address] [place], [place] [address]   Brain natriuretic peptide     Status: Abnormal   Collection Time: 02/18/18  4:16 PM  Result Value Ref Range   B Natriuretic Peptide 3,332.0 (H) 0.0 - 100.0 pg/mL    Comment: Performed at [facility], [address] [place], [place] [address]  CBC with Differential     Status: Abnormal   Collection Time: 02/18/18  4:16 PM  Result Value Ref Range   WBC 7.0 3.8 - 10.6 K/uL   RBC 5.32 4.40 - 5.90 MIL/uL   Hemoglobin 12.3 (L) 13.0 - 18.0 g/dL   HCT 38.4 (L) 40.0 - 52.0 %    MCV 72.3 (L) 80.0 - 100.0 fL   MCH 23.2 (L) 26.0 - 34.0 pg   MCHC 32.1 32.0 - 36.0 g/dL   RDW 29.1 (H) 11.5 - 14.5 %   Platelets 262 150 - 440 K/uL   Neutrophils Relative % 78 %   Neutro Abs 5.4 1.4 - 6.5 K/uL   Lymphocytes Relative 13 %   Lymphs Abs 0.9 (L) 1.0 - 3.6 K/uL   Monocytes Relative 8 %   Monocytes Absolute 0.5 0.2 - 1.0 K/uL   Eosinophils Relative 0 %  Eosinophils Absolute 0.0 0 - 0.7 K/uL   Basophils Relative 1 %   Basophils Absolute 0.1 0 - 0.1 K/uL    Comment: Performed at [facility], [address] [place], [place] [address]  Urinalysis, Complete w Microscopic     Status: Abnormal   Collection Time: 02/18/18  4:16 PM  Result Value Ref Range   Color, Urine YELLOW (A) YELLOW   APPearance CLEAR (A) CLEAR   Specific Gravity, Urine 1.008 1.005 - 1.030   pH 6.0 5.0 - 8.0   Glucose, UA NEGATIVE NEGATIVE mg/dL   Hgb urine dipstick NEGATIVE NEGATIVE   Bilirubin Urine NEGATIVE NEGATIVE   Ketones, ur NEGATIVE NEGATIVE mg/dL   Protein, ur 100 (A) NEGATIVE mg/dL   Nitrite NEGATIVE NEGATIVE   Leukocytes, UA NEGATIVE NEGATIVE   WBC, UA 0-5 0 - 5 WBC/hpf   Bacteria, UA NONE SEEN NONE SEEN   Squamous Epithelial / LPF NONE SEEN 0 - 5    Comment: Performed at [facility], [address] [place], [place] [address]  Basic metabolic panel     Status: Abnormal   Collection Time: 02/19/18  6:50 AM  Result Value Ref Range   Sodium 133 (L) 135 - 145 mmol/L   Potassium 4.1 3.5 - 5.1 mmol/L   Chloride 97 (L) 101 - 111 mmol/L   CO2 22 22 - 32 mmol/L   Glucose, Bld 115 (H) 65 - 99 mg/dL   BUN 71 (H) 6 - 20 mg/dL   Creatinine, Ser 3.38 (H) 0.61 - 1.24 mg/dL   Calcium 8.7 (L) 8.9 - 10.3 mg/dL   GFR calc non Af Amer 18 (L) >60 mL/min   GFR calc Af Amer 21 (L) >60 mL/min    Comment: (NOTE) The eGFR has been calculated using the CKD EPI equation. This calculation has not been validated in all clinical situations. eGFR's persistently <60 mL/min signify  possible Chronic Kidney Disease.    Anion gap 14 5 - 15    Comment: Performed at [facility], [address] [place], [place] [address]  CBC     Status: Abnormal   Collection Time: 02/19/18  6:50 AM  Result Value Ref Range   WBC 7.3 3.8 - 10.6 K/uL   RBC 5.58 4.40 - 5.90 MIL/uL   Hemoglobin 12.7 (L) 13.0 - 18.0 g/dL   HCT 40.3 40.0 - 52.0 %   MCV 72.2 (L) 80.0 - 100.0 fL   MCH 22.8 (L) 26.0 - 34.0 pg   MCHC 31.6 (L) 32.0 - 36.0 g/dL   RDW 29.4 (H) 11.5 - 14.5 %   Platelets 279 150 - 440 K/uL    Comment: Performed at [facility], [address] [place], [place] [address]  Creatinine clearance, urine, 24 hour     Status: Abnormal   Collection Time: 02/19/18 11:09 AM  Result Value Ref Range   Urine Total Volume-CRCL 250 mL   Collection Interval-CRCL 24 hours    Comment: CORRECTED ON 06/14 AT 1222: PREVIOUSLY REPORTED AS 251m, CORRECTED ON 06/14 AT 1203: PREVIOUSLY REPORTED AS 24   Creatinine, Urine 133 mg/dL   Creatinine, 24H Ur 333 (L) 800 - 2,000 mg/day   Creatinine Clearance 6 (L) 75 - 125 mL/min    Comment: Performed at [facility] [address], [place] [id number] CBC     Status: Abnormal   Collection Time: 02/20/18  4:50 AM  Result Value Ref Range   WBC 7.7 3.8 -  10.6 K/uL   RBC 5.56 4.40 - 5.90 MIL/uL   Hemoglobin 12.7 (L) 13.0 - 18.0 g/dL   HCT 39.8 (L) 40.0 - 52.0 %   MCV 71.5 (L) 80.0 - 100.0 fL   MCH 22.9 (L) 26.0 - 34.0 pg   MCHC 32.0 32.0 - 36.0 g/dL   RDW 27.9 (H) 11.5 - 14.5 %   Platelets 314 150 - 440 K/uL    Comment: Performed at [facility], [address] [place], [place] [address]  Basic metabolic panel     Status: Abnormal   Collection Time: 02/20/18  4:50 AM  Result Value Ref Range   Sodium 134 (L) 135 - 145 mmol/L   Potassium 4.4 3.5 - 5.1 mmol/L   Chloride 97 (L) 101 - 111 mmol/L   CO2 19 (L) 22 - 32 mmol/L   Glucose, Bld 89 65 - 99 mg/dL   BUN 76 (H) 6 - 20 mg/dL   Creatinine, Ser 3.62  (H) 0.61 - 1.24 mg/dL   Calcium 8.8 (L) 8.9 - 10.3 mg/dL   GFR calc non Af Amer 17 (L) >60 mL/min   GFR calc Af Amer 19 (L) >60 mL/min    Comment: (NOTE) The eGFR has been calculated using the CKD EPI equation. This calculation has not been validated in all clinical situations. eGFR's persistently <60 mL/min signify possible Chronic Kidney Disease.    Anion gap 18 (H) 5 - 15    Comment: Performed at [facility], [address] [place], [place] [address]    Dg Chest Portable 1 View  Result Date: 02/18/2018 CLINICAL DATA:  Pt has CHF. Hx of CHF, COPD, hypertension, MI, defibrillator. Non smoker EXAM: PORTABLE CHEST 1 VIEW COMPARISON:  Chest x-ray dated 02/17/2018 and chest x-ray dated 10/03/2017. FINDINGS: Cardiomegaly is grossly stable. Probable mild edema within the RIGHT perihilar lung. Lungs otherwise clear. No pleural effusion or pneumothorax seen. LEFT chest wall ICD apparatus is stable in position. No acute or suspicious osseous finding. IMPRESSION: 1. Vague opacity within the RIGHT perihilar lung, most likely mild asymmetric edema related to the given history of CHF, alternatively fluid within the RIGHT minor fissure. Lungs otherwise clear. 2. Stable cardiomegaly. Electronically Signed   By: Franki Cabot M.D.   On: 02/18/2018 16:52    ROS Blood pressure 115/87, pulse 93, temperature 98.3 F (36.8 C), temperature source Oral, resp. rate 18, SpO2 93 %. Physical Exam  Patient is awake, alert, in no acute distress Vital signs: Please see the above listed vital signs HEENT: No oral lesions appreciated, trachea midline, no thyromegaly noted Cardiovascular: Regular rate and rhythm, systolic murmurleft parasternal border Urinary: Scant basilar crackles Abdominal: Positive bowel sounds, soft exam Extremities: No clubbing, cyanosis,2-3+ pitting edema notedNeurologic: No focal deficits noted Cutaneous: No rashes or lesions noted  Assessment/Plan:  Congestive heart failure,  acute on chronic with severe cardiomyopathy, ejection fraction 20-25%.Has been seen by cardiology, has not responded well to diuresis alone being moved to ICU for dobutamine infusion. Consult for PICC line placement  Chronic stage IV renal disease. Being followed by nephrology  COPDon bronchodilator therapy and chronic prednisone therapy  Hyponatremia. most likely secondary to chronic congestive heart failure  Mild bump in troponin. Most likely reflects supply demand ischemia, initial EKG revealed generalized low voltage, poor progression, left atrial enlargement with nonspecific ST-T wave changes   Asheton Viramontes 02/20/2018, 2:26 PM

## 2018-02-20 NOTE — Progress Notes (Signed)
Consult for PICC placement noted in [facility]. No order for PICC placement present. CRCL 21 with Renal MD involvement. Notified Estate agent to inform her that we need an order to place a PICC, and Renal MD notification and approval before a PICC could be placed.

## 2018-02-20 NOTE — Progress Notes (Signed)
[employer] [facility] [place] [facility]   PATIENT NAME: Kelly Leonard    MR#:  [id number]  DATE OF BIRTH:  01-02-1956  SUBJECTIVE:  CHIEF COMPLAINT:   Chief Complaint  Patient presents with  . Shortness of Breath  SOB slowly improving REVIEW OF SYSTEMS:  Review of Systems  Constitutional: Negative for chills, fever and weight loss.  HENT: Negative for nosebleeds and sore throat.   Eyes: Negative for blurred vision.  Respiratory: Positive for shortness of breath. Negative for cough and wheezing.   Cardiovascular: Negative for chest pain, orthopnea, leg swelling and PND.  Gastrointestinal: Negative for abdominal pain, constipation, diarrhea, heartburn, nausea and vomiting.  Genitourinary: Negative for dysuria and urgency.  Musculoskeletal: Negative for back pain.  Skin: Negative for rash.  Neurological: Negative for dizziness, speech change, focal weakness and headaches.  Endo/Heme/Allergies: Does not bruise/bleed easily.  Psychiatric/Behavioral: Negative for depression.   DRUG ALLERGIES:   Allergies  Allergen Reactions  . Entresto [Sacubitril-Valsartan] Swelling    Swelling of the face  . Penicillins Anaphylaxis and Swelling    Has patient had a PCN reaction causing immediate rash, facial/tongue/throat swelling, SOB or lightheadedness with hypotension: Yes Has patient had a PCN reaction causing severe rash involving mucus membranes or skin necrosis: No Has patient had a PCN reaction that required hospitalization: No Has patient had a PCN reaction occurring within the last 10 years: No If all of the above answers are "NO", then may proceed with Cephalosporin use.   . Seroquel [Quetiapine] Nausea And Vomiting and Hives    Hives   . Ace Inhibitors Rash  . Corlanor [Ivabradine] Swelling    Facial swelling  . Ambien [Zolpidem Tartrate] Swelling  . Bidil [Isosorb Dinitrate-Hydralazine] Hives  . Carvedilol Other (See Comments)    "dizziness, light headed,  and nausea"  . Hydroxyzine Nausea Only  . Prednisone Other (See Comments)    Causes fluid build up  . Sertraline Diarrhea    itching  . Trazodone And Nefazodone Swelling and Other (See Comments)    "up for days" and lip swelling  . Uloric [Febuxostat] Other (See Comments)    Jittery, jerky, makes him lose his mind  . Benadryl [Diphenhydramine Hcl] Other (See Comments)    "makes me crazy and hyper"  . Gabapentin Swelling    Lip and face swelling  . Lorazepam Rash    Rash and severe anxiety  . Torsemide Hives, Itching, Nausea And Vomiting and Rash   VITALS:  Blood pressure 115/87, pulse 93, temperature 98.3 F (36.8 C), temperature source Oral, resp. rate 18, SpO2 93 %. PHYSICAL EXAMINATION:  Physical Exam  Constitutional: He is oriented to person, place, and time.  HENT:  Head: Normocephalic and atraumatic.  Eyes: Pupils are equal, round, and reactive to light. Conjunctivae and EOM are normal.  Neck: Normal range of motion. Neck supple. No tracheal deviation present. No thyromegaly present.  Cardiovascular: Normal rate, regular rhythm and normal heart sounds.  Pulmonary/Chest: Effort normal and breath sounds normal. No respiratory distress. He has no wheezes. He exhibits no tenderness.  Abdominal: Soft. Bowel sounds are normal. He exhibits no distension. There is no tenderness.  Musculoskeletal: Normal range of motion.  Neurological: He is alert and oriented to person, place, and time. No cranial nerve deficit.  Skin: Skin is warm and dry. No rash noted.   LABORATORY PANEL:  Male CBC Recent Labs  Lab 02/20/18 0450  WBC 7.7  HGB 12.7*  HCT 39.8*  PLT 314   ------------------------------------------------------------------------------------------------------------------  Chemistries  Recent Labs  Lab 02/18/18 1616  02/20/18 0450  NA 135   < > 134*  K 4.7   < > 4.4  CL 95*   < > 97*  CO2 25   < > 19*  GLUCOSE 94   < > 89  BUN 66*   < > 76*  CREATININE 3.19*   < >  3.62*  CALCIUM 8.9   < > 8.8*  AST 57*  --   --   ALT 28  --   --   ALKPHOS 104  --   --   BILITOT 1.2  --   --    < > = values in this interval not displayed.   RADIOLOGY:  No results found. ASSESSMENT AND PLAN:  62 year old male with past medical history of severe dilated cardiomyopathy EF of 20%, status post AICD, chronic systolic CHF, COPD, CKD stage IV, polycystic kidney disease admitted due to shortness of breath and worsening lower extremity edema.  1.  CHF-acute on chronic systolic dysfunction due to NICM:  Ef 20-25%.   Patient presents with significant volume overload and anasarca. - appreciate cardio input - continue Lasix drip, follow I's and O's and daily weights. - Continue Imdur, patient is refusing hydralazine as he thinks he's allergic to prednisone and hydralazine both.  2.  Chronic kidney disease stage IV-secondary to polycystic kidney disease.  Patient's creatinine is close to baseline.   - Nephro getting 24-hour urine for creatinine clearance to decide overall renal clearance.  Currently getting furosemide infusion.  Nephro to decide for dialysis.  - continue Flomax & Nepro  3.  Essential hypertension-continue Imdur, patient refused hydralazine  4.  COPD-no acute exacerbation-he's refusing prednisone - thinks he's allergic to it (he claims he gets rash from hydralazine and prednisone).  5.  Recent history of C. difficile colitis-no diarrhea presently.  Patient has finished treatment with oral vancomycin.  6.  Elevated troponin-secondary to supply demand ischemia from CKD.  No acute chest pain.       All the records are reviewed and case discussed with Care Management/Social Worker. Management plans discussed with the patient, cardio and they are in agreement.  CODE STATUS: Full Code  TOTAL TIME TAKING CARE OF THIS PATIENT: 35 minutes.   More than 50% of the time was spent in counseling/coordination of care: YES  POSSIBLE D/C IN 2-3 DAYS, DEPENDING  ON CLINICAL CONDITION and cardiac eval.   Delfino Lovett M.D on 02/20/2018 at 12:13 PM  Between 7am to 6pm - Pager - [phone]  After 6pm go to [email or web address] - Social research officer, government  [facility]  Office  [phone]  CC: Primary care physician; Corky Downs, MD  Note: This dictation was prepared with Dragon dictation along with smaller phrase technology. Any transcriptional errors that result from this process are unintentional.

## 2018-02-20 NOTE — Progress Notes (Signed)
Requested by RN to contact IV team, called, no answer.

## 2018-02-20 NOTE — Progress Notes (Signed)
Progress Note  Patient Name: Kelly Leonard Date of Encounter: 02/20/2018  Primary Cardiologist: Arvilla Meres, MD  Subjective   Breathing slightly better.  Still dyspneic with speech and orthopneic however.  No chest pain.  Inpatient Medications    Scheduled Meds: . cholecalciferol  1,000 Units Oral Daily  . heparin  5,000 Units Subcutaneous Q8H  . mouth rinse  15 mL Mouth Rinse BID  . metoprolol succinate  25 mg Oral Daily  . triamcinolone ointment  1 application Topical BID   Continuous Infusions: . furosemide (LASIX) infusion 10 mg/hr (02/20/18 0314)   PRN Meds: acetaminophen **OR** acetaminophen, chlordiazePOXIDE, guaiFENesin-dextromethorphan, ondansetron **OR** ondansetron (ZOFRAN) IV   Vital Signs    Vitals:   02/19/18 1642 02/19/18 1925 02/20/18 0405 02/20/18 0832  BP: (!) 135/110 (!) 120/94 117/88 115/87  Pulse: (!) 101 91 95 93  Resp:  17 17 18   Temp: (!) 97.3 F (36.3 C) (!) 97.3 F (36.3 C) 97.7 F (36.5 C) 98.3 F (36.8 C)  TempSrc: Oral Oral Oral Oral  SpO2: 99% 100% 95% 93%    Intake/Output Summary (Last 24 hours) at 02/20/2018 1048 Last data filed at 02/20/2018 0649 Gross per 24 hour  Intake 473 ml  Output 665 ml  Net -192 ml   There were no vitals filed for this visit.  Physical Exam   GEN: Well nourished, well developed, in no acute distress.  HEENT: Grossly normal.  Neck: Supple, JVD to jaw, no carotid bruits, or masses. Cardiac: RRR, +S3/S4, 2/6 syst murmur LLSB  Apex. No clubbing, cyanosis, 2+ bilat LE edema to thighs - sl better than yesterday.  Radials/DP/PT 2+ and equal bilaterally.  Respiratory:  Respirations regular and unlabored, bibasilar crackles, otw, clear to auscultation bilaterally. GI: Soft, nontender, nondistended, BS + x 4. MS: no deformity or atrophy. Skin: warm and dry, no rash. Neuro:  Strength and sensation are intact. Psych: AAOx3.  Normal affect.  Labs    Chemistry Recent Labs  Lab 02/18/18 1616  02/19/18 0650 02/20/18 0450  NA 135 133* 134*  K 4.7 4.1 4.4  CL 95* 97* 97*  CO2 25 22 19*  GLUCOSE 94 115* 89  BUN 66* 71* 76*  CREATININE 3.19* 3.38* 3.62*  CALCIUM 8.9 8.7* 8.8*  PROT 7.4  --   --   ALBUMIN 3.4*  --   --   AST 57*  --   --   ALT 28  --   --   ALKPHOS 104  --   --   BILITOT 1.2  --   --   GFRNONAA 19* 18* 17*  GFRAA 23* 21* 19*  ANIONGAP 15 14 18*     Hematology Recent Labs  Lab 02/18/18 1616 02/19/18 0650 02/20/18 0450  WBC 7.0 7.3 7.7  RBC 5.32 5.58 5.56  HGB 12.3* 12.7* 12.7*  HCT 38.4* 40.3 39.8*  MCV 72.3* 72.2* 71.5*  MCH 23.2* 22.8* 22.9*  MCHC 32.1 31.6* 32.0  RDW 29.1* 29.4* 27.9*  PLT 262 279 314    Cardiac Enzymes Recent Labs  Lab 02/17/18 1738 02/18/18 1616  TROPONINI 0.05* 0.05*      BNP Recent Labs  Lab 02/17/18 1738 02/18/18 1616  BNP 4,302.0* 3,332.0*      Radiology    Dg Chest Portable 1 View  Result Date: 02/18/2018 CLINICAL DATA:  Pt has CHF. Hx of CHF, COPD, hypertension, MI, defibrillator. Non smoker EXAM: PORTABLE CHEST 1 VIEW COMPARISON:  Chest x-ray dated 02/17/2018 and chest x-ray dated  10/03/2017. FINDINGS: Cardiomegaly is grossly stable. Probable mild edema within the RIGHT perihilar lung. Lungs otherwise clear. No pleural effusion or pneumothorax seen. LEFT chest wall ICD apparatus is stable in position. No acute or suspicious osseous finding. IMPRESSION: 1. Vague opacity within the RIGHT perihilar lung, most likely mild asymmetric edema related to the given history of CHF, alternatively fluid within the RIGHT minor fissure. Lungs otherwise clear. 2. Stable cardiomegaly. Electronically Signed   By: Bary Richard M.D.   On: 02/18/2018 16:52    Telemetry    Regular sinus rhythm, 94- Personally Reviewed  Patient Profile     62 y.o. male with a history of NICM, HFrEF, CKD IV in the setting of polycystic kidney dzs, HTN, HL, anxiety/panic attacks, and COPD admitted June 12 with acute on chronic systolic  heart failure and massive volume overload.  Assessment & Plan   1.  Acute on chronic systolic congestive heart failure/nonischemic cardiomyopathy: EF 20 to 25%.  Recent hospitalization at [facility] with right heart catheterization showing cardiac output of 2.7 and cardiac index of 1.6.  He did briefly require dobutamine during that admission and was discharged home at a weight of 123 pounds on Bumex 3 mg daily.  He did not require HD.  Weight climbed to 147 at home despite taking Bumex 3 mg twice daily.  He was markedly volume overloaded on admission and was placed on Lasix infusion.  Net negative only -292 overnight and -552 for admission.  He has not been weighed.  BUN and creatinine rising at 76 and 3.62 this morning.  He says he's feeling a little better but remains significantly volume overloaded.  In setting of cardiorenal syndrome, I suspect he will require inotropic support with dobutamine.  Will d/w Dr. Kirke Corin - ? Central line/co-ox.  Med mgmt limited otw 2/2 multiple intolerances.  He had been on bidil for about a year but recently noted a rash, which he attributes to bidil, thus he is refusing.   blocker was started yesterday but in setting of low output I am going to d/c.  2.  Stage IV chronic kidney disease: BUN/creatinine rising.  Nephrology following.  3.  Essential hypertension: BiDil discontinued as patient refused it.  BP stable.  Signed, Nicolasa Ducking, NP  02/20/2018, 10:48 AM    For questions or updates, please contact   Please consult [email or web address] for contact info under Cardiology/STEMI.

## 2018-02-21 DIAGNOSIS — I10 Essential (primary) hypertension: Secondary | ICD-10-CM

## 2018-02-21 DIAGNOSIS — I5043 Acute on chronic combined systolic (congestive) and diastolic (congestive) heart failure: Secondary | ICD-10-CM

## 2018-02-21 LAB — PHOSPHORUS: Phosphorus: 5.4 mg/dL — ABNORMAL HIGH (ref 2.5–4.6)

## 2018-02-21 LAB — BASIC METABOLIC PANEL
Anion gap: 13 (ref 5–15)
BUN: 88 mg/dL — ABNORMAL HIGH (ref 6–20)
CHLORIDE: 97 mmol/L — AB (ref 101–111)
CO2: 23 mmol/L (ref 22–32)
Calcium: 8.3 mg/dL — ABNORMAL LOW (ref 8.9–10.3)
Creatinine, Ser: 3.58 mg/dL — ABNORMAL HIGH (ref 0.61–1.24)
GFR, EST AFRICAN AMERICAN: 20 mL/min — AB (ref >60)
GFR, EST NON AFRICAN AMERICAN: 17 mL/min — AB (ref >60)
Glucose, Bld: 119 mg/dL — ABNORMAL HIGH (ref 65–99)
POTASSIUM: 4 mmol/L (ref 3.5–5.1)
SODIUM: 133 mmol/L — AB (ref 135–145)

## 2018-02-21 LAB — CBC
HCT: 35.8 % — ABNORMAL LOW (ref 40.0–52.0)
HEMOGLOBIN: 11.4 g/dL — AB (ref 13.0–18.0)
MCH: 23 pg — ABNORMAL LOW (ref 26.0–34.0)
MCHC: 32 g/dL (ref 32.0–36.0)
MCV: 71.9 fL — ABNORMAL LOW (ref 80.0–100.0)
PLATELETS: 254 10*3/uL (ref 150–440)
RBC: 4.98 MIL/uL (ref 4.40–5.90)
RDW: 28.3 % — ABNORMAL HIGH (ref 11.5–14.5)
WBC: 7.2 10*3/uL (ref 3.8–10.6)

## 2018-02-21 LAB — MAGNESIUM: Magnesium: 2.1 mg/dL (ref 1.7–2.4)

## 2018-02-21 NOTE — Progress Notes (Signed)
Progress Note  Patient Name: Kelly Leonard Date of Encounter: 02/21/2018  Primary Cardiologist: Arvilla Meres, MD   Subjective   62 year old gentleman with a history of hypertension, hyperlipidemia, COPD, polycystic kidney disease with stage IV CKD and chronic systolic congestive heart failure, moderate to severe mitral regurgitation, moderate pulmonary hypertension.  He was admitted with respiratory distress.  He was seen by Dr. Kirke Corin yesterday.  He was being given IV Lasix drip but did not have any significant diuresis.  He was started on dobutamine for inotropic support.  Diuresed 1.7 L since his admission. Feeling better.   Inpatient Medications    Scheduled Meds: . cholecalciferol  1,000 Units Oral Daily  . feeding supplement (NEPRO CARB STEADY)  237 mL Oral Q24H  . heparin  5,000 Units Subcutaneous Q8H  . mouth rinse  15 mL Mouth Rinse BID  . tamsulosin  0.4 mg Oral Daily  . triamcinolone ointment  1 application Topical BID   Continuous Infusions: . DOBUTamine 5 mcg/kg/min (02/20/18 2010)  . furosemide (LASIX) infusion 10 mg/hr (02/21/18 0420)   PRN Meds: acetaminophen **OR** acetaminophen, chlordiazePOXIDE, guaiFENesin-dextromethorphan, ondansetron **OR** ondansetron (ZOFRAN) IV   Vital Signs    Vitals:   02/21/18 0500 02/21/18 0530 02/21/18 0600 02/21/18 0630  BP: (!) 135/98 128/87 (!) 131/91 (!) 130/107  Pulse: (!) 101 97 99 95  Resp: 11 12 14 13   Temp:      TempSrc:      SpO2: 100% 98% 99% 99%  Weight:      Height:        Intake/Output Summary (Last 24 hours) at 02/21/2018 0937 Last data filed at 02/21/2018 0647 Gross per 24 hour  Intake 766.17 ml  Output 1925 ml  Net -1158.83 ml   Filed Weights   02/20/18 1455 02/21/18 0400  Weight: 150 lb 5.7 oz (68.2 kg) 152 lb 12.5 oz (69.3 kg)    Telemetry    Sinus tach  - Personally Reviewed  ECG    NSR - Personally Reviewed  Physical Exam   GEN: thin , middle age man No acute distress.     Neck: No JVD Cardiac: RR ,  + S3 gallop.  Soft systolic murmur   Respiratory:  reduced breath sound s GI: Soft, nontender, non-distended  MS:  2+ edema in thighs,   ( lowest part of his body recently - has legs elevated in bed .   Lower legs have trace  - 1+ edema  Neuro:  Nonfocal  Psych: Normal affect   Labs    Chemistry Recent Labs  Lab 02/18/18 1616 02/19/18 0650 02/20/18 0450 02/21/18 0551  NA 135 133* 134* 133*  K 4.7 4.1 4.4 4.0  CL 95* 97* 97* 97*  CO2 25 22 19* 23  GLUCOSE 94 115* 89 119*  BUN 66* 71* 76* 88*  CREATININE 3.19* 3.38* 3.62* 3.58*  CALCIUM 8.9 8.7* 8.8* 8.3*  PROT 7.4  --   --   --   ALBUMIN 3.4*  --   --   --   AST 57*  --   --   --   ALT 28  --   --   --   ALKPHOS 104  --   --   --   BILITOT 1.2  --   --   --   GFRNONAA 19* 18* 17* 17*  GFRAA 23* 21* 19* 20*  ANIONGAP 15 14 18* 13     Hematology Recent Labs  Lab 02/19/18 0650 02/20/18  0450 02/21/18 0551  WBC 7.3 7.7 7.2  RBC 5.58 5.56 4.98  HGB 12.7* 12.7* 11.4*  HCT 40.3 39.8* 35.8*  MCV 72.2* 71.5* 71.9*  MCH 22.8* 22.9* 23.0*  MCHC 31.6* 32.0 32.0  RDW 29.4* 27.9* 28.3*  PLT 279 314 254    Cardiac Enzymes Recent Labs  Lab 02/17/18 1738 02/18/18 1616  TROPONINI 0.05* 0.05*   No results for input(s): TROPIPOC in the last 168 hours.   BNP Recent Labs  Lab 02/17/18 1738 02/18/18 1616  BNP 4,302.0* 3,332.0*     DDimer No results for input(s): DDIMER in the last 168 hours.   Radiology    [place] Ekg Site Rite  Result Date: 02/20/2018 If [facility] image not attached, placement could not be confirmed due to current cardiac rhythm.   Cardiac Studies      Patient Profile     62 y.o. male with acute on chronic combined systolic and diastolic congestive heart failure.    Assessment & Plan    1.  Acute on chronic combined systolic and diastolic congestive heart failure: He is doing better on the milrinone and Lasix drip.  He is diuresed 1.7 L.  I would continue both  of these for the next 24 hours and we will reevaluate.  He still has significant edema and has an S3 gallop.  2.   Chronic renal insufficiency: Creatinine is 3.58 this morning.  Phosphorus is 5.4.  Potassium is 4.0.  Further management per nephrology.  3.  Essential hypertension: Blood pressure is improving.  Continue current medications.  For questions or updates, please contact [facility] Please consult [email or web address] for contact info under Cardiology/STEMI.      Signed, Kristeen Miss, MD  02/21/2018, 9:37 AM

## 2018-02-21 NOTE — Progress Notes (Signed)
Follow up - Critical Care Medicine Note  Patient Details:    Kelly Leonard is an 62 y.o. male.with a past medical history remarkable for hypertension, hyperlipidemia, COPD, polycystic kidney disease with stage IV renal failuregeneralized anxiety disorder severe cardiomyopathy with last measured ejection fraction at 20%,status post AICD, moderate to severe mitral regurgitation, pulmonary artery pressures measured at 50, presented to the emergency department on 6/11 with significant weight gain along with shortness of breath, PND and orthopnea. transferred to the intensive care unit for dobutamine infusion to help assist with diuresis.    Lines, Airways, Drains: Urethral Catheter candace mcfail rn Non-latex 14 Fr. (Active)  Indication for Insertion or Continuance of Catheter Aggressive IV diuresis 02/21/2018  3:05 AM  Site Assessment Intact 02/21/2018  3:05 AM  Catheter Maintenance Bag below level of bladder;Catheter secured;Drainage bag/tubing not touching floor;Insertion date on drainage bag;No dependent loops;Seal intact;Bag emptied prior to transport 02/21/2018  3:05 AM  Collection Container Standard drainage bag 02/21/2018  3:05 AM  Securement Method Securing device (Describe) 02/21/2018  3:05 AM  Urinary Catheter Interventions Unclamped 02/21/2018  3:05 AM  Output (mL) 160 mL 02/21/2018  6:47 AM    Anti-infectives:  Anti-infectives (From admission, onward)   None      Microbiology: Results for orders placed or performed during the hospital encounter of 02/18/18  MRSA PCR Screening     Status: None   Collection Time: 02/20/18  4:07 PM  Result Value Ref Range Status   MRSA by PCR NEGATIVE NEGATIVE Final    Comment:        The GeneXpert MRSA Assay (FDA approved for NASAL specimens only), is one component of a comprehensive MRSA colonization surveillance program. It is not intended to diagnose MRSA infection nor to guide or monitor treatment for MRSA infections. Performed at  [facility], [address] [place], [place] [address]    Events: Patient started on dobutamine, diuresed 1.1 L  Studies: Dg Chest 2 View  Result Date: 02/17/2018 CLINICAL DATA:  Dyspnea with swelling of the legs in general region region. Swelling x3 days. EXAM: CHEST - 2 VIEW COMPARISON:  02/03/2018 FINDINGS: Stable cardiomegaly with minimal aortic atherosclerosis. Mild vascular congestion is noted. ICD device projects over the left upper thorax with lead in the right ventricle. Bibasilar atelectasis and/or scarring is seen. Probable small right pleural effusion blunting cardiophrenic angle. No acute nor suspicious osseous abnormality. IMPRESSION: Cardiomegaly with slight increase in pulmonary vascular congestion since prior. Bibasilar atelectasis with probable small right pleural effusion. Findings likely represent a component of mild CHF. Electronically Signed   By: Tollie Eth M.D.   On: 02/17/2018 18:41   Dg Chest 2 View  Result Date: 02/03/2018 CLINICAL DATA:  Shortness of breath, cough, scrotal swelling EXAM: CHEST - 2 VIEW COMPARISON:  01/22/2018 FINDINGS: Mild right basilar scarring/atelectasis. No frank interstitial edema. Small right pleural effusion. No pneumothorax. Cardiomegaly.  Left subclavian ICD. IMPRESSION: Small right pleural effusion. Cardiomegaly.  No frank interstitial edema. Electronically Signed   By: Charline Bills M.D.   On: 02/03/2018 20:27   Dg Chest 2 View  Result Date: 01/22/2018 CLINICAL DATA:  Pt complains of SOB x 1 week. Past week he had some nausea, vomiting and diarrhea. Hx CHF, HTN, COPD. EXAM: CHEST - 2 VIEW COMPARISON:  Chest x-rays dated 01/21/2018, 11/29/2017 and 09/09/2017. FINDINGS: Grossly stable cardiomegaly. LEFT chest wall pacemaker/ICD hardware appears stable in position with intact lead. Persistent opacity at the RIGHT lung base, not significantly changed compared to  yesterday's exam. Lungs otherwise clear. No pneumothorax or  significant pleural effusions seen. Blunting of the costophrenic angles could represent small bilateral pleural effusions or chronic pleural thickening. No acute or suspicious osseous finding. IMPRESSION: 1. Persistent opacity at the RIGHT lung base, stable compared to yesterday's exam, new compared to earlier chest x-rays, again suspicious for pneumonia. 2. Stable cardiomegaly. Electronically Signed   By: Bary Richard M.D.   On: 01/22/2018 20:00   Dg Chest Portable 1 View  Result Date: 02/18/2018 CLINICAL DATA:  Pt has CHF. Hx of CHF, COPD, hypertension, MI, defibrillator. Non smoker EXAM: PORTABLE CHEST 1 VIEW COMPARISON:  Chest x-ray dated 02/17/2018 and chest x-ray dated 10/03/2017. FINDINGS: Cardiomegaly is grossly stable. Probable mild edema within the RIGHT perihilar lung. Lungs otherwise clear. No pleural effusion or pneumothorax seen. LEFT chest wall ICD apparatus is stable in position. No acute or suspicious osseous finding. IMPRESSION: 1. Vague opacity within the RIGHT perihilar lung, most likely mild asymmetric edema related to the given history of CHF, alternatively fluid within the RIGHT minor fissure. Lungs otherwise clear. 2. Stable cardiomegaly. Electronically Signed   By: Bary Richard M.D.   On: 02/18/2018 16:52   [place] Ekg Site Rite  Result Date: 02/20/2018 If Site Rite image not attached, placement could not be confirmed due to current cardiac rhythm.   Consults: Treatment Team:  Antonieta Iba, MD Mosetta Pigeon, MD   Subjective:    Overnight Issues: no difficulties related overnight, continues on dobutamine infusion  Objective:  Vital signs for last 24 hours: Temp:  [96.8 F (36 C)-98.1 F (36.7 C)] 97.6 F (36.4 C) (06/15 0400) Pulse Rate:  [83-103] 95 (06/15 0630) Resp:  [11-22] 13 (06/15 0630) BP: (121-156)/(87-110) 130/107 (06/15 0630) SpO2:  [94 %-100 %] 99 % (06/15 0630) Weight:  [150 lb 5.7 oz (68.2 kg)-152 lb 12.5 oz (69.3 kg)] 152 lb 12.5 oz (69.3 kg)  (06/15 0400)  Hemodynamic parameters for last 24 hours:    Intake/Output from previous day: 06/14 0701 - 06/15 0700 In: 766.2 [P.O.:480; I.V.:286.2] Out: 1925 [Urine:1925]  Intake/Output this shift: No intake/output data recorded.  Vent settings for last 24 hours:    Physical Exam:   Patient is awake, alert, in no acute distress Vital signs:       Please see the above listed vital signs HEENT:           No oral lesions appreciated, trachea midline, no thyromegaly noted Cardiovascular:           Regular rate and rhythm, systolic murmurleft parasternal border Urinary:           Scant basilar crackles Abdominal:      Positive bowel sounds, soft exam Extremities:     No clubbing, cyanosis,2-3+ pitting edema notedNeurologic: No focal deficits noted Cutaneous:      No rashes or lesions noted  Assessment/Plan:   Congestive heart failure, acute on chronic with severe cardiomyopathy, ejection fraction 20-25%.on dobutamine infusion along with aggressive diuresis. Diuresis 1.1 L feels somewhat better.we'll discuss with cardiology regarding the addition of digoxin  Chronic stage IV renal disease. Being followed by nephrology  COPDon bronchodilator therapy and chronic prednisone therapy  Hyponatremia. most likely secondary to chronic congestive heart failure  Mild bump in troponin. Most likely reflects supply demand ischemia, initial EKG revealed generalized low voltage, poor progression, left atrial enlargement with nonspecific ST-T wave changes   Kelly Leonard 02/21/2018  *Care during the described time interval was provided by me and/or other providers  on the critical care team.  I have reviewed this patient's available data, including medical history, events of note, physical examination and test results as part of my evaluation.

## 2018-02-21 NOTE — Progress Notes (Signed)
   02/21/18 1238  Clinical Encounter Type  Visited With Patient  Visit Type Initial   Chaplain paged for emotional support.  Chaplain checked in with nurse and was told patient just received news he needs to begin dialysis or he won't have long to live.  Patient expressed sadness to Chaplain.  He thought things were going well until this current hospitalization and is dismayed at this turn of events.  He has a supportive wife and family in the local area.  Patient quoted scripture to [place] and Chaplain provided active listening, support and prayer.  Patient expressed desire for a return visit in the future.

## 2018-02-21 NOTE — Progress Notes (Signed)
Spoke with Seward Grater, NP concerning patient's diet. Pt needs to be on fluid restriction with daily weights. Orders entered by NP. Provided patient and wife with education concerning 1500 ml fluid, both verbalized understanding and stated that he was on a 2000 ml fluid restriction prior to admit. Reinforced that while admitted to ICU with gtts infusing that is upmost important to comply with fluid restriction. Pt and wife verbalized understanding and demonstrated with pitcher at bedside 24 hour intake.

## 2018-02-21 NOTE — Progress Notes (Signed)
Spoke with RN re PICC order to be d/c due to PICC to be placed in IJ.

## 2018-02-21 NOTE — Progress Notes (Signed)
[facility], [place] 02/21/18  Subjective:   Patient is known to our practice from outpatient follow-up He presented to the emergency room via EMS for shortness of breath, worsening lower extremity edema, increasing swelling in the scrotal area.   He was transferred to ICU for dobutamine infusion UOP improved to 1900 cc   Objective:  Vital signs in last 24 hours:  Temp:  [96.8 F (36 C)-98.1 F (36.7 C)] 97.6 F (36.4 C) (06/15 0400) Pulse Rate:  [83-103] 95 (06/15 0630) Resp:  [11-22] 13 (06/15 0630) BP: (121-156)/(87-110) 130/107 (06/15 0630) SpO2:  [94 %-100 %] 99 % (06/15 0630) Weight:  [68.2 kg (150 lb 5.7 oz)-69.3 kg (152 lb 12.5 oz)] 69.3 kg (152 lb 12.5 oz) (06/15 0400)  Weight change:  Filed Weights   02/20/18 1455 02/21/18 0400  Weight: 68.2 kg (150 lb 5.7 oz) 69.3 kg (152 lb 12.5 oz)    Intake/Output:    Intake/Output Summary (Last 24 hours) at 02/21/2018 0907 Last data filed at 02/21/2018 0647 Gross per 24 hour  Intake 766.17 ml  Output 1925 ml  Net -1158.83 ml     Physical Exam: General:  No acute distress, laying in the bed  HEENT  moist mucous membranes, nasal cannula oxygen  Neck  supple  Pulm/lungs  clear today  CVS/Heart  irregular rhythm  Abdomen:   Distended, nontender  Extremities:  2-3+ edema b.l  Neurologic:  Alert, able to follow commands             Basic Metabolic Panel:  Recent Labs  Lab 02/17/18 1738 02/18/18 1616 02/19/18 0650 02/20/18 0450 02/21/18 0551  NA 137 135 133* 134* 133*  K 3.8 4.7 4.1 4.4 4.0  CL 99* 95* 97* 97* 97*  CO2 26 25 22  19* 23  GLUCOSE 95 94 115* 89 119*  BUN 58* 66* 71* 76* 88*  CREATININE 3.13* 3.19* 3.38* 3.62* 3.58*  CALCIUM 9.1 8.9 8.7* 8.8* 8.3*  MG  --   --   --   --  2.1  PHOS  --   --   --   --  5.4*     CBC: Recent Labs  Lab 02/17/18 1738 02/18/18 1616 02/19/18 0650 02/20/18 0450 02/21/18 0551  WBC 6.2 7.0 7.3 7.7 7.2  NEUTROABS  --  5.4  --    --   --   HGB 11.3* 12.3* 12.7* 12.7* 11.4*  HCT 35.5* 38.4* 40.3 39.8* 35.8*  MCV 71.6* 72.3* 72.2* 71.5* 71.9*  PLT 281 262 279 314 254      Lab Results  Component Value Date   HEPBSAG Negative 07/08/2017   HEPBIGM Negative 07/08/2017      Microbiology:  Recent Results (from the past 240 hour(s))  MRSA PCR Screening     Status: None   Collection Time: 02/20/18  4:07 PM  Result Value Ref Range Status   MRSA by PCR NEGATIVE NEGATIVE Final    Comment:        The GeneXpert MRSA Assay (FDA approved for NASAL specimens only), is one component of a comprehensive MRSA colonization surveillance program. It is not intended to diagnose MRSA infection nor to guide or monitor treatment for MRSA infections. Performed at [facility], [address] [place], [place] [address]     Coagulation Studies: No results for input(s): LABPROT, INR in the last 72 hours.  Urinalysis: Recent Labs    02/18/18 1616  COLORURINE YELLOW*  LABSPEC 1.008  PHURINE 6.0  GLUCOSEU NEGATIVE  HGBUR NEGATIVE  BILIRUBINUR NEGATIVE  KETONESUR NEGATIVE  PROTEINUR 100*  NITRITE NEGATIVE  LEUKOCYTESUR NEGATIVE      Imaging: [place] Ekg Site Rite  Result Date: 02/20/2018 If Site Rite image not attached, placement could not be confirmed due to current cardiac rhythm.    Medications:   . DOBUTamine 5 mcg/kg/min (02/20/18 2010)  . furosemide (LASIX) infusion 10 mg/hr (02/21/18 0420)   . cholecalciferol  1,000 Units Oral Daily  . feeding supplement (NEPRO CARB STEADY)  237 mL Oral Q24H  . heparin  5,000 Units Subcutaneous Q8H  . mouth rinse  15 mL Mouth Rinse BID  . tamsulosin  0.4 mg Oral Daily  . triamcinolone ointment  1 application Topical BID   acetaminophen **OR** acetaminophen, chlordiazePOXIDE, guaiFENesin-dextromethorphan, ondansetron **OR** ondansetron (ZOFRAN) IV  Assessment/ Plan:  62 y.o. African-American male with chronic systolic congestive heart failure, ICD, COPD,  hypertension, polycystic kidney disease, chronic kidney disease now presents with worsening shortness of breath and volume overload. LVEF 20-25 % from 06/2017  1.  Chronic kidney disease stage5 2.  Generalized edema 3.  Urinary retention 4.  Chronic systolic congestive heart failure  Patient has advanced renal dysfunction.  He has now volume overload.  Recently he has been hospitalized many times.   24-hour urine for creatinine clearance is 6 cc/min.  Discussed with patient that he may need dialysis in the near future.  We discussed about options available to him as well as placement of AV access. Patient discussed this information with his wife over the phone who verbalized that she would like a second opinion    At present, there is no acute indication for hemodialysis.  Follow cardiology recommendations for management of CHF.  We will follow along   LOS: 3 Atzin Buchta Thedore Mins 6/15/20199:07 AM  [facility] [place], [place] [phone]  Note: This note was prepared with Dragon dictation. Any transcription errors are unintentional

## 2018-02-21 NOTE — Plan of Care (Addendum)
Pt A&O X's 3, confused to year but reorients quickly. Pt denies pain this shift. Pt has Lasix and Dobutamine infusing per order. Improved urinary output along noticeable improvement with BL lower extremity and perineum edema. Pt up to bedside with assistance for small loose BM. 1st degree HB on the monitor, VSS.

## 2018-02-21 NOTE — Progress Notes (Signed)
Dobutamine started per order.

## 2018-02-21 NOTE — Progress Notes (Signed)
Foley discontinued at patient request, Ok'd by MD Conforti.

## 2018-02-21 NOTE — Progress Notes (Signed)
[facility] [place] [facility]   PATIENT NAME: Aldean Pipe    MR#:  [id number]  DATE OF BIRTH:  November 04, 1955  SUBJECTIVE:  CHIEF COMPLAINT:   Chief Complaint  Patient presents with  . Shortness of Breath   Some improvement in shortness of breath.  REVIEW OF SYSTEMS:  Review of Systems  Constitutional: Negative for chills, fever and weight loss.  HENT: Negative for nosebleeds and sore throat.   Eyes: Negative for blurred vision.  Respiratory: Positive for shortness of breath. Negative for cough and wheezing.   Cardiovascular: Negative for chest pain, orthopnea, leg swelling and PND.  Gastrointestinal: Negative for abdominal pain, constipation, diarrhea, heartburn, nausea and vomiting.  Genitourinary: Negative for dysuria and urgency.  Musculoskeletal: Negative for back pain.  Skin: Negative for rash.  Neurological: Negative for dizziness, speech change, focal weakness and headaches.  Endo/Heme/Allergies: Does not bruise/bleed easily.  Psychiatric/Behavioral: Negative for depression.   DRUG ALLERGIES:   Allergies  Allergen Reactions  . Entresto [Sacubitril-Valsartan] Swelling    Swelling of the face  . Penicillins Anaphylaxis and Swelling    Has patient had a PCN reaction causing immediate rash, facial/tongue/throat swelling, SOB or lightheadedness with hypotension: Yes Has patient had a PCN reaction causing severe rash involving mucus membranes or skin necrosis: No Has patient had a PCN reaction that required hospitalization: No Has patient had a PCN reaction occurring within the last 10 years: No If all of the above answers are "NO", then may proceed with Cephalosporin use.   . Seroquel [Quetiapine] Nausea And Vomiting and Hives    Hives   . Ace Inhibitors Rash  . Corlanor [Ivabradine] Swelling    Facial swelling  . Ambien [Zolpidem Tartrate] Swelling  . Bidil [Isosorb Dinitrate-Hydralazine] Hives  . Carvedilol Other (See Comments)   "dizziness, light headed, and nausea"  . Hydroxyzine Nausea Only  . Prednisone Other (See Comments)    Causes fluid build up  . Sertraline Diarrhea    itching  . Trazodone And Nefazodone Swelling and Other (See Comments)    "up for days" and lip swelling  . Uloric [Febuxostat] Other (See Comments)    Jittery, jerky, makes him lose his mind  . Benadryl [Diphenhydramine Hcl] Other (See Comments)    "makes me crazy and hyper"  . Gabapentin Swelling    Lip and face swelling  . Lorazepam Rash    Rash and severe anxiety  . Torsemide Hives, Itching, Nausea And Vomiting and Rash   VITALS:  Blood pressure (!) 121/91, pulse (!) 101, temperature 97.6 F (36.4 C), temperature source Oral, resp. rate (!) 27, height 5\' 9"  (1.753 m), weight 69.3 kg (152 lb 12.5 oz), SpO2 100 %. PHYSICAL EXAMINATION:  Physical Exam  Constitutional: He is oriented to person, place, and time.  HENT:  Head: Normocephalic and atraumatic.  Eyes: Pupils are equal, round, and reactive to light. Conjunctivae and EOM are normal.  Neck: Normal range of motion. Neck supple. No tracheal deviation present. No thyromegaly present.  Cardiovascular: Normal rate, regular rhythm and normal heart sounds.  LE edema  Pulmonary/Chest: Effort normal and breath sounds normal. No respiratory distress. He has no wheezes. He exhibits no tenderness.  Abdominal: Soft. Bowel sounds are normal. He exhibits no distension. There is no tenderness.  Musculoskeletal: Normal range of motion.  Neurological: He is alert and oriented to person, place, and time. No cranial nerve deficit.  Skin: Skin is warm and dry. No rash noted.   LABORATORY PANEL:  Male CBC Recent Labs  Lab 02/21/18 0551  WBC 7.2  HGB 11.4*  HCT 35.8*  PLT 254   ------------------------------------------------------------------------------------------------------------------ Chemistries  Recent Labs  Lab 02/18/18 1616  02/21/18 0551  NA 135   < > 133*  K 4.7   < >  4.0  CL 95*   < > 97*  CO2 25   < > 23  GLUCOSE 94   < > 119*  BUN 66*   < > 88*  CREATININE 3.19*   < > 3.58*  CALCIUM 8.9   < > 8.3*  MG  --   --  2.1  AST 57*  --   --   ALT 28  --   --   ALKPHOS 104  --   --   BILITOT 1.2  --   --    < > = values in this interval not displayed.   RADIOLOGY:  [place] Ekg Site Rite  Result Date: 02/20/2018 If Site Rite image not attached, placement could not be confirmed due to current cardiac rhythm.  ASSESSMENT AND PLAN:  62 year old male with past medical history of severe dilated cardiomyopathy EF of 20%, status post AICD, chronic systolic CHF, COPD, CKD stage IV, polycystic kidney disease admitted due to shortness of breath and worsening lower extremity edema.  * CHF-acute on chronic systolic dysfunction due to NICM:  Ef 20-25%.   Still has anasarca - appreciate cardiology input - continue Lasix drip, follow I's and O's and daily weights. Also on dobutamine.  Continue ICU care  * Chronic kidney disease stage IV-secondary to polycystic kidney disease.  Patient's creatinine is close to baseline.   Monitor while being diuresed  *  Essential hypertension-continue Imdur, patient refused hydralazine  * COPD-no acute exacerbation  *  Recent history of C. difficile colitis-no diarrhea presently.  Patient has finished treatment with oral vancomycin.  * Elevated troponin secondary to supply demand ischemia from CKD.  No acute chest pain.  All the records are reviewed and case discussed with Care Management/Social Worker. Management plans discussed with the patient, cardio and they are in agreement.  CODE STATUS: Full Code  TOTAL TIME TAKING CARE OF THIS PATIENT: 35 minutes.   More than 50% of the time was spent in counseling/coordination of care: YES  POSSIBLE D/C IN 2-3 DAYS, DEPENDING ON CLINICAL CONDITION and cardiac eval.   Orie Fisherman M.D on 02/21/2018 at 11:34 AM  Between 7am to 6pm - Pager - [phone]  After 6pm go  to [email or web address] - Social research officer, government  [facility] [place]  Office  [phone]  CC: Primary care physician; Corky Downs, MD  Note: This dictation was prepared with Dragon dictation along with smaller phrase technology. Any transcriptional errors that result from this process are unintentional.

## 2018-02-22 LAB — BASIC METABOLIC PANEL
ANION GAP: 13 (ref 5–15)
BUN: 87 mg/dL — AB (ref 6–20)
CALCIUM: 8.3 mg/dL — AB (ref 8.9–10.3)
CO2: 26 mmol/L (ref 22–32)
Chloride: 97 mmol/L — ABNORMAL LOW (ref 101–111)
Creatinine, Ser: 3.32 mg/dL — ABNORMAL HIGH (ref 0.61–1.24)
GFR calc Af Amer: 22 mL/min — ABNORMAL LOW (ref >60)
GFR, EST NON AFRICAN AMERICAN: 19 mL/min — AB (ref >60)
GLUCOSE: 103 mg/dL — AB (ref 65–99)
Potassium: 3.4 mmol/L — ABNORMAL LOW (ref 3.5–5.1)
Sodium: 136 mmol/L (ref 135–145)

## 2018-02-22 LAB — BRAIN NATRIURETIC PEPTIDE: B Natriuretic Peptide: 3404 pg/mL — ABNORMAL HIGH (ref 0.0–100.0)

## 2018-02-22 NOTE — Progress Notes (Signed)
Progress Note  Patient Name: Kelly Leonard Date of Encounter: 02/22/2018  Primary Cardiologist: Arvilla Meres, MD   Subjective   62 year old gentleman with a history of hypertension, hyperlipidemia, COPD, polycystic kidney disease with stage IV CKD and chronic systolic congestive heart failure, moderate to severe mitral regurgitation, moderate pulmonary hypertension.  He was admitted with respiratory distress.  He was seen by Dr. Kirke Corin yesterday.  He was being given IV Lasix drip but did not have any significant diuresis.  He was started on dobutamine for inotropic support.  He has continued to diurese -  Net 5.1 liters out so far this admission   Inpatient Medications    Scheduled Meds: . cholecalciferol  1,000 Units Oral Daily  . feeding supplement (NEPRO CARB STEADY)  237 mL Oral Q24H  . heparin  5,000 Units Subcutaneous Q8H  . mouth rinse  15 mL Mouth Rinse BID  . tamsulosin  0.4 mg Oral Daily  . triamcinolone ointment  1 application Topical BID   Continuous Infusions: . DOBUTamine 5 mcg/kg/min (02/21/18 1900)  . furosemide (LASIX) infusion 10 mg/hr (02/22/18 0249)   PRN Meds: acetaminophen **OR** acetaminophen, chlordiazePOXIDE, guaiFENesin-dextromethorphan, ondansetron **OR** ondansetron (ZOFRAN) IV   Vital Signs    Vitals:   02/22/18 0400 02/22/18 0430 02/22/18 0500 02/22/18 0530  BP:  (!) 140/101 (!) 136/101 (!) 143/107  Pulse:  90 99 100  Resp:  16 16 13   Temp: 97.6 F (36.4 C)     TempSrc: Oral     SpO2:  98% 100% 100%  Weight: 149 lb 14.6 oz (68 kg)     Height:        Intake/Output Summary (Last 24 hours) at 02/22/2018 0918 Last data filed at 02/22/2018 0900 Gross per 24 hour  Intake 600 ml  Output 4000 ml  Net -3400 ml   Filed Weights   02/20/18 1455 02/21/18 0400 02/22/18 0400  Weight: 150 lb 5.7 oz (68.2 kg) 152 lb 12.5 oz (69.3 kg) 149 lb 14.6 oz (68 kg)    Telemetry    NSR at 98  - Personally Reviewed  ECG    NSR - Personally  Reviewed  Physical Exam   Physical Exam: Blood pressure (!) 143/107, pulse 100, temperature 97.6 F (36.4 C), temperature source Oral, resp. rate 13, height 5\' 9"  (1.753 m), weight 149 lb 14.6 oz (68 kg), SpO2 100 %.  GEN:   Thin, middle age male, nAD  HEENT: Normal NECK: No JVD; No carotid bruits LYMPHATICS: No lymphadenopathy CARDIAC:  RR , + S3 gallop  RESPIRATORY:   Slight wheez, coughing this am   ABDOMEN: Soft, non-tender, non-distended MUSCULOSKELETAL:  1+ edema in thighs,  Improved from yesterday   SKIN: Warm and dry NEUROLOGIC:  Alert and oriented x 3   Labs    Chemistry Recent Labs  Lab 02/18/18 1616  02/20/18 0450 02/21/18 0551 02/22/18 0442  NA 135   < > 134* 133* 136  K 4.7   < > 4.4 4.0 3.4*  CL 95*   < > 97* 97* 97*  CO2 25   < > 19* 23 26  GLUCOSE 94   < > 89 119* 103*  BUN 66*   < > 76* 88* 87*  CREATININE 3.19*   < > 3.62* 3.58* 3.32*  CALCIUM 8.9   < > 8.8* 8.3* 8.3*  PROT 7.4  --   --   --   --   ALBUMIN 3.4*  --   --   --   --  AST 57*  --   --   --   --   ALT 28  --   --   --   --   ALKPHOS 104  --   --   --   --   BILITOT 1.2  --   --   --   --   GFRNONAA 19*   < > 17* 17* 19*  GFRAA 23*   < > 19* 20* 22*  ANIONGAP 15   < > 18* 13 13   < > = values in this interval not displayed.     Hematology Recent Labs  Lab 02/19/18 0650 02/20/18 0450 02/21/18 0551  WBC 7.3 7.7 7.2  RBC 5.58 5.56 4.98  HGB 12.7* 12.7* 11.4*  HCT 40.3 39.8* 35.8*  MCV 72.2* 71.5* 71.9*  MCH 22.8* 22.9* 23.0*  MCHC 31.6* 32.0 32.0  RDW 29.4* 27.9* 28.3*  PLT 279 314 254    Cardiac Enzymes Recent Labs  Lab 02/17/18 1738 02/18/18 1616  TROPONINI 0.05* 0.05*   No results for input(s): TROPIPOC in the last 168 hours.   BNP Recent Labs  Lab 02/17/18 1738 02/18/18 1616 02/22/18 0442  BNP 4,302.0* 3,332.0* 3,404.0*     DDimer No results for input(s): DDIMER in the last 168 hours.   Radiology    [place] Ekg Site Rite  Result Date: 02/20/2018 If [facility] image not attached, placement could not be confirmed due to current cardiac rhythm.   Cardiac Studies      Patient Profile     62 y.o. male with acute on chronic combined systolic and diastolic congestive heart failure.    Assessment & Plan    1.  Acute on chronic combined systolic and diastolic congestive heart failure: Seems to be doing better on a dobutamine and IV Lasix.  He is diuresed 5.1 L.  We will try stopping the dobutamine today.  He might be able to be switched from IV Lasix to oral diuretics.  Torsemide might work better than furosemide.  2.   Chronic renal insufficiency: Creatinine is 3.32.  Further plans per internal medicine.  3.  Essential hypertension: Pressures well controlled this morning.  For questions or updates, please contact [facility] Please consult [email or web address] for contact info under Cardiology/STEMI.      Signed, Kristeen Miss, MD  02/22/2018, 9:18 AM

## 2018-02-22 NOTE — Progress Notes (Signed)
Follow up - Critical Care Medicine Note  Patient Details:    Kelly Leonard is an 62 y.o. male.with a past medical history remarkable for hypertension, hyperlipidemia, COPD, polycystic kidney disease with stage IV renal failuregeneralized anxiety disorder severe cardiomyopathy with last measured ejection fraction at 20%,status post AICD, moderate to severe mitral regurgitation, pulmonary artery pressures measured at 50, presented to the emergency department on 6/11 with significant weight gain along with shortness of breath, PND and orthopnea. transferred to the intensive care unit for dobutamine infusion to help assist with diuresis.    Lines, Airways, Drains: Urethral Catheter candace mcfail rn Non-latex 14 Fr. (Active)  Indication for Insertion or Continuance of Catheter Aggressive IV diuresis 02/21/2018  3:05 AM  Site Assessment Intact 02/21/2018  3:05 AM  Catheter Maintenance Bag below level of bladder;Catheter secured;Drainage bag/tubing not touching floor;Insertion date on drainage bag;No dependent loops;Seal intact;Bag emptied prior to transport 02/21/2018  3:05 AM  Collection Container Standard drainage bag 02/21/2018  3:05 AM  Securement Method Securing device (Describe) 02/21/2018  3:05 AM  Urinary Catheter Interventions Unclamped 02/21/2018  3:05 AM  Output (mL) 160 mL 02/21/2018  6:47 AM    Anti-infectives:  Anti-infectives (From admission, onward)   None      Microbiology: Results for orders placed or performed during the hospital encounter of 02/18/18  MRSA PCR Screening     Status: None   Collection Time: 02/20/18  4:07 PM  Result Value Ref Range Status   MRSA by PCR NEGATIVE NEGATIVE Final    Comment:        The GeneXpert MRSA Assay (FDA approved for NASAL specimens only), is one component of a comprehensive MRSA colonization surveillance program. It is not intended to diagnose MRSA infection nor to guide or monitor treatment for MRSA infections. Performed at  [facility], [address] [place], [place] [address]    Events: Patient started on dobutamine, diuresed 1.1 L  Studies: Dg Chest 2 View  Result Date: 02/17/2018 CLINICAL DATA:  Dyspnea with swelling of the legs in general region region. Swelling x3 days. EXAM: CHEST - 2 VIEW COMPARISON:  02/03/2018 FINDINGS: Stable cardiomegaly with minimal aortic atherosclerosis. Mild vascular congestion is noted. ICD device projects over the left upper thorax with lead in the right ventricle. Bibasilar atelectasis and/or scarring is seen. Probable small right pleural effusion blunting cardiophrenic angle. No acute nor suspicious osseous abnormality. IMPRESSION: Cardiomegaly with slight increase in pulmonary vascular congestion since prior. Bibasilar atelectasis with probable small right pleural effusion. Findings likely represent a component of mild CHF. Electronically Signed   By: Tollie Eth M.D.   On: 02/17/2018 18:41   Dg Chest 2 View  Result Date: 02/03/2018 CLINICAL DATA:  Shortness of breath, cough, scrotal swelling EXAM: CHEST - 2 VIEW COMPARISON:  01/22/2018 FINDINGS: Mild right basilar scarring/atelectasis. No frank interstitial edema. Small right pleural effusion. No pneumothorax. Cardiomegaly.  Left subclavian ICD. IMPRESSION: Small right pleural effusion. Cardiomegaly.  No frank interstitial edema. Electronically Signed   By: Charline Bills M.D.   On: 02/03/2018 20:27   Dg Chest Portable 1 View  Result Date: 02/18/2018 CLINICAL DATA:  Pt has CHF. Hx of CHF, COPD, hypertension, MI, defibrillator. Non smoker EXAM: PORTABLE CHEST 1 VIEW COMPARISON:  Chest x-ray dated 02/17/2018 and chest x-ray dated 10/03/2017. FINDINGS: Cardiomegaly is grossly stable. Probable mild edema within the RIGHT perihilar lung. Lungs otherwise clear. No pleural effusion or pneumothorax seen. LEFT chest wall ICD apparatus is stable in position. No acute  or suspicious osseous finding. IMPRESSION: 1. Vague opacity  within the RIGHT perihilar lung, most likely mild asymmetric edema related to the given history of CHF, alternatively fluid within the RIGHT minor fissure. Lungs otherwise clear. 2. Stable cardiomegaly. Electronically Signed   By: Bary Richard M.D.   On: 02/18/2018 16:52   [place] Ekg Site Rite  Result Date: 02/20/2018 If Site Rite image not attached, placement could not be confirmed due to current cardiac rhythm.   Consults: Treatment Team:  Antonieta Iba, MD Mosetta Pigeon, MD   Subjective:    Overnight Issues:patient doing quite well, states he feels back to baseline, is requesting to have his dobutamine infusion stopped and possibly discharged  Objective:  Vital signs for last 24 hours: Temp:  [96.9 F (36.1 C)-98.3 F (36.8 C)] 97.6 F (36.4 C) (06/16 0400) Pulse Rate:  [90-108] 100 (06/16 0530) Resp:  [12-27] 13 (06/16 0530) BP: (117-148)/(87-115) 143/107 (06/16 0530) SpO2:  [93 %-100 %] 100 % (06/16 0530) Weight:  [149 lb 14.6 oz (68 kg)] 149 lb 14.6 oz (68 kg) (06/16 0400)  Hemodynamic parameters for last 24 hours:    Intake/Output from previous day: 06/15 0701 - 06/16 0700 In: 600 [P.O.:240; I.V.:360] Out: 3250 [Urine:2550; Emesis/NG output:700]  Intake/Output this shift: No intake/output data recorded.  Vent settings for last 24 hours:    Physical Exam:   Patient is awake, alert, in no acute distress Vital signs:       Please see the above listed vital signs HEENT:           No oral lesions appreciated, trachea midline, no thyromegaly noted Cardiovascular:           Regular rate and rhythm, systolic murmurleft parasternal border Pulmonary:           Clear on exam Abdominal:      Positive bowel sounds, soft exam Extremities:     No clubbing, cyanosis,2-3+ pitting edema notedNeurologic: No focal deficits noted Cutaneous:      No rashes or lesions noted  Assessment/Plan:   Congestive heart failure, acute on chronic with severe cardiomyopathy, ejection  fraction 20-25%.on dobutamine infusion along with aggressive diuresis. Diuresis 2.6 L feels much better.we'll discuss with cardiology  Chronic stage IV renal disease. Being followed by nephrology  COPDon bronchodilator therapy and chronic prednisone therapy  Hyponatremia. most likely secondary to chronic congestive heart failure  Mild bump in troponin. Most likely reflects supply demand ischemia, initial EKG revealed generalized low voltage, poor progression, left atrial enlargement with nonspecific ST-T wave changes   Michalene Debruler 02/22/2018  *Care during the described time interval was provided by me and/or other providers on the critical care team.  I have reviewed this patient's available data, including medical history, events of note, physical examination and test results as part of my evaluation. Patient ID: Jameil Whitmoyer, male   DOB: 1955-12-26, 62 y.o.   MRN: [id number]

## 2018-02-22 NOTE — Progress Notes (Signed)
[facility] [place] [facility]   PATIENT NAME: Kelly Leonard    MR#:  [id number]  DATE OF BIRTH:  09/27/1955  SUBJECTIVE:  CHIEF COMPLAINT:   Chief Complaint  Patient presents with  . Shortness of Breath   Shortness of breath and lower extremity edema are significantly improved. On dobutamine and Lasix drip  REVIEW OF SYSTEMS:  Review of Systems  Constitutional: Negative for chills, fever and weight loss.  HENT: Negative for nosebleeds and sore throat.   Eyes: Negative for blurred vision.  Respiratory: Positive for shortness of breath. Negative for cough and wheezing.   Cardiovascular: Negative for chest pain, orthopnea, leg swelling and PND.  Gastrointestinal: Negative for abdominal pain, constipation, diarrhea, heartburn, nausea and vomiting.  Genitourinary: Negative for dysuria and urgency.  Musculoskeletal: Negative for back pain.  Skin: Negative for rash.  Neurological: Negative for dizziness, speech change, focal weakness and headaches.  Endo/Heme/Allergies: Does not bruise/bleed easily.  Psychiatric/Behavioral: Negative for depression.   DRUG ALLERGIES:   Allergies  Allergen Reactions  . Entresto [Sacubitril-Valsartan] Swelling    Swelling of the face  . Penicillins Anaphylaxis and Swelling    Has patient had a PCN reaction causing immediate rash, facial/tongue/throat swelling, SOB or lightheadedness with hypotension: Yes Has patient had a PCN reaction causing severe rash involving mucus membranes or skin necrosis: No Has patient had a PCN reaction that required hospitalization: No Has patient had a PCN reaction occurring within the last 10 years: No If all of the above answers are "NO", then may proceed with Cephalosporin use.   . Seroquel [Quetiapine] Nausea And Vomiting and Hives    Hives   . Ace Inhibitors Rash  . Corlanor [Ivabradine] Swelling    Facial swelling  . Ambien [Zolpidem Tartrate] Swelling  . Bidil [Isosorb  Dinitrate-Hydralazine] Hives  . Carvedilol Other (See Comments)    "dizziness, light headed, and nausea"  . Hydroxyzine Nausea Only  . Prednisone Other (See Comments)    Causes fluid build up  . Sertraline Diarrhea    itching  . Trazodone And Nefazodone Swelling and Other (See Comments)    "up for days" and lip swelling  . Uloric [Febuxostat] Other (See Comments)    Jittery, jerky, makes him lose his mind  . Benadryl [Diphenhydramine Hcl] Other (See Comments)    "makes me crazy and hyper"  . Gabapentin Swelling    Lip and face swelling  . Lorazepam Rash    Rash and severe anxiety  . Torsemide Hives, Itching, Nausea And Vomiting and Rash   VITALS:  Blood pressure (!) 125/93, pulse 100, temperature 97.6 F (36.4 C), temperature source Oral, resp. rate 16, height 5\' 9"  (1.753 m), weight 68 kg (149 lb 14.6 oz), SpO2 100 %. PHYSICAL EXAMINATION:  Physical Exam  Constitutional: He is oriented to person, place, and time.  HENT:  Head: Normocephalic and atraumatic.  Eyes: Pupils are equal, round, and reactive to light. Conjunctivae and EOM are normal.  Neck: Normal range of motion. Neck supple. No tracheal deviation present. No thyromegaly present.  Cardiovascular: Normal rate, regular rhythm and normal heart sounds.  Pulmonary/Chest: Effort normal and breath sounds normal. No respiratory distress. He has no wheezes. He exhibits no tenderness.  Abdominal: Soft. Bowel sounds are normal. He exhibits no distension. There is no tenderness.  Musculoskeletal: Normal range of motion.  Neurological: He is alert and oriented to person, place, and time. No cranial nerve deficit.  Skin: Skin is warm and dry. No  rash noted.   LABORATORY PANEL:  Male CBC Recent Labs  Lab 02/21/18 0551  WBC 7.2  HGB 11.4*  HCT 35.8*  PLT 254   ------------------------------------------------------------------------------------------------------------------ Chemistries  Recent Labs  Lab 02/18/18 1616   02/21/18 0551 02/22/18 0442  NA 135   < > 133* 136  K 4.7   < > 4.0 3.4*  CL 95*   < > 97* 97*  CO2 25   < > 23 26  GLUCOSE 94   < > 119* 103*  BUN 66*   < > 88* 87*  CREATININE 3.19*   < > 3.58* 3.32*  CALCIUM 8.9   < > 8.3* 8.3*  MG  --   --  2.1  --   AST 57*  --   --   --   ALT 28  --   --   --   ALKPHOS 104  --   --   --   BILITOT 1.2  --   --   --    < > = values in this interval not displayed.   RADIOLOGY:  No results found. ASSESSMENT AND PLAN:  62 year old male with past medical history of severe dilated cardiomyopathy EF of 20%, status post AICD, chronic systolic CHF, COPD, CKD stage IV, polycystic kidney disease admitted due to shortness of breath and worsening lower extremity edema.  * CHF-acute on chronic systolic dysfunction due to NICM:  Ef 20-25%.   Still has anasarca - appreciate cardiology input - continue Lasix drip, follow I's and O's and daily weights. Will wait for cardiology before stopping dobutamine  * Chronic kidney disease stage IV-secondary to polycystic kidney disease.  Patient's creatinine is close to baseline.   Monitor while being diuresed Will likely need dialysis in the near future  *  Essential hypertension-continue Imdur, patient refused hydralazine  * COPD-no acute exacerbation  *  Recent history of C. difficile colitis-no diarrhea presently.  Patient has finished treatment with oral vancomycin.  * Elevated troponin secondary to supply demand ischemia from CKD.  No acute chest pain.  All the records are reviewed and case discussed with Care Management/Social Worker. Management plans discussed with the patient, cardio and they are in agreement.  CODE STATUS: Full Code  TOTAL TIME TAKING CARE OF THIS PATIENT: 35 minutes.   POSSIBLE D/C IN 2-3 DAYS, DEPENDING ON CLINICAL CONDITION and cardiac eval.  Kelly Leonard M.D on 02/22/2018 at 11:22 AM  Between 7am to 6pm - Pager - [phone]  After 6pm go to [email or web address] -  Social research officer, government  [facility]  Office  [phone]  CC: Primary care physician; Kelly Downs, MD  Note: This dictation was prepared with Dragon dictation along with smaller phrase technology. Any transcriptional errors that result from this process are unintentional.

## 2018-02-22 NOTE — Progress Notes (Signed)
[facility], [place] 02/22/18  Subjective:   Patient is known to our practice from outpatient follow-up He presented to the emergency room via EMS for shortness of breath, worsening lower extremity edema, increasing swelling in the scrotal area.   He was transferred to ICU for dobutamine infusion He diuresed about 5 L total so far This morning he feels back to his baseline    Objective:  Vital signs in last 24 hours:  Temp:  [96.9 F (36.1 C)-98.3 F (36.8 C)] 97.6 F (36.4 C) (06/16 0400) Pulse Rate:  [90-108] 100 (06/16 0530) Resp:  [12-24] 16 (06/16 1002) BP: (121-148)/(87-115) 125/93 (06/16 1002) SpO2:  [93 %-100 %] 100 % (06/16 0530) Weight:  [68 kg (149 lb 14.6 oz)] 68 kg (149 lb 14.6 oz) (06/16 0400)  Weight change: -0.2 kg (-7.1 oz) Filed Weights   02/20/18 1455 02/21/18 0400 02/22/18 0400  Weight: 68.2 kg (150 lb 5.7 oz) 69.3 kg (152 lb 12.5 oz) 68 kg (149 lb 14.6 oz)    Intake/Output:    Intake/Output Summary (Last 24 hours) at 02/22/2018 1207 Last data filed at 02/22/2018 1142 Gross per 24 hour  Intake 840 ml  Output 4175 ml  Net -3335 ml     Physical Exam: General:  No acute distress, laying in the bed  HEENT  moist mucous membranes, nasal cannula oxygen  Neck  supple  Pulm/lungs  clear today  CVS/Heart  irregular rhythm  Abdomen:   Distended, nontender  Extremities: trace edema  Neurologic:  Alert, able to follow commands             Basic Metabolic Panel:  Recent Labs  Lab 02/18/18 1616 02/19/18 0650 02/20/18 0450 02/21/18 0551 02/22/18 0442  NA 135 133* 134* 133* 136  K 4.7 4.1 4.4 4.0 3.4*  CL 95* 97* 97* 97* 97*  CO2 25 22 19* 23 26  GLUCOSE 94 115* 89 119* 103*  BUN 66* 71* 76* 88* 87*  CREATININE 3.19* 3.38* 3.62* 3.58* 3.32*  CALCIUM 8.9 8.7* 8.8* 8.3* 8.3*  MG  --   --   --  2.1  --   PHOS  --   --   --  5.4*  --      CBC: Recent Labs  Lab 02/17/18 1738 02/18/18 1616 02/19/18 0650  02/20/18 0450 02/21/18 0551  WBC 6.2 7.0 7.3 7.7 7.2  NEUTROABS  --  5.4  --   --   --   HGB 11.3* 12.3* 12.7* 12.7* 11.4*  HCT 35.5* 38.4* 40.3 39.8* 35.8*  MCV 71.6* 72.3* 72.2* 71.5* 71.9*  PLT 281 262 279 314 254      Lab Results  Component Value Date   HEPBSAG Negative 07/08/2017   HEPBIGM Negative 07/08/2017      Microbiology:  Recent Results (from the past 240 hour(s))  MRSA PCR Screening     Status: None   Collection Time: 02/20/18  4:07 PM  Result Value Ref Range Status   MRSA by PCR NEGATIVE NEGATIVE Final    Comment:        The GeneXpert MRSA Assay (FDA approved for NASAL specimens only), is one component of a comprehensive MRSA colonization surveillance program. It is not intended to diagnose MRSA infection nor to guide or monitor treatment for MRSA infections. Performed at [facility], [address] [place], [place] [address]     Coagulation Studies: No results for input(s): LABPROT, INR in the last 72 hours.  Urinalysis:  No results for input(s): COLORURINE, LABSPEC, PHURINE, GLUCOSEU, HGBUR, BILIRUBINUR, KETONESUR, PROTEINUR, UROBILINOGEN, NITRITE, LEUKOCYTESUR in the last 72 hours.  Invalid input(s): APPERANCEUR    Imaging: [place] Ekg Site Rite  Result Date: 02/20/2018 If Site Rite image not attached, placement could not be confirmed due to current cardiac rhythm.    Medications:   . DOBUTamine Stopped (02/22/18 0954)  . furosemide (LASIX) infusion Stopped (02/22/18 1100)   . cholecalciferol  1,000 Units Oral Daily  . feeding supplement (NEPRO CARB STEADY)  237 mL Oral Q24H  . heparin  5,000 Units Subcutaneous Q8H  . mouth rinse  15 mL Mouth Rinse BID  . tamsulosin  0.4 mg Oral Daily  . triamcinolone ointment  1 application Topical BID   acetaminophen **OR** acetaminophen, chlordiazePOXIDE, guaiFENesin-dextromethorphan, ondansetron **OR** ondansetron (ZOFRAN) IV  Assessment/ Plan:  62 y.o. African-American male with  chronic systolic congestive heart failure, ICD, COPD, hypertension, polycystic kidney disease, chronic kidney disease now presents with worsening shortness of breath and volume overload. LVEF 20-25 % from 06/2017  1.  Chronic kidney disease stage 5 2.  Generalized edema 3.  Urinary retention 4.  Chronic systolic congestive heart failure  Patient has advanced renal dysfunction.  He has now volume overload.  Recently he has been hospitalized many times.   24-hour urine for creatinine clearance is 6 cc/min.  Discussed with patient that he may need dialysis in the near future.  We discussed about options available to him as well as placement of AV access. Patient discussed this information with his wife over the phone who verbalized that she would like a second opinion    At present, there is no acute indication for hemodialysis.  Follow cardiology recommendations for management of CHF.  Agree with discontinuing dobutamine at present and starting torsemide.  We will continue to follow   LOS: 4 Edith Lord Thedore Mins 6/16/201912:07 PM  [facility] [place], [place] [phone]  Note: This note was prepared with Dragon dictation. Any transcription errors are unintentional

## 2018-02-23 ENCOUNTER — Emergency Department (HOSPITAL_COMMUNITY): Payer: BLUE CROSS/BLUE SHIELD

## 2018-02-23 ENCOUNTER — Encounter (HOSPITAL_COMMUNITY): Payer: Self-pay | Admitting: *Deleted

## 2018-02-23 ENCOUNTER — Telehealth (HOSPITAL_COMMUNITY): Payer: Self-pay | Admitting: Surgery

## 2018-02-23 ENCOUNTER — Other Ambulatory Visit: Payer: Self-pay

## 2018-02-23 ENCOUNTER — Inpatient Hospital Stay (HOSPITAL_COMMUNITY)
Admission: EM | Admit: 2018-02-23 | Discharge: 2018-03-03 | DRG: 291 | Disposition: A | Discharge status: Hospice | Payer: BLUE CROSS/BLUE SHIELD | Attending: Pulmonary Disease | Admitting: Pulmonary Disease

## 2018-02-23 DIAGNOSIS — Z7189 Other specified counseling: Secondary | ICD-10-CM

## 2018-02-23 DIAGNOSIS — M109 Gout, unspecified: Secondary | ICD-10-CM | POA: Diagnosis present

## 2018-02-23 DIAGNOSIS — I420 Dilated cardiomyopathy: Secondary | ICD-10-CM

## 2018-02-23 DIAGNOSIS — B1920 Unspecified viral hepatitis C without hepatic coma: Secondary | ICD-10-CM | POA: Diagnosis present

## 2018-02-23 DIAGNOSIS — I428 Other cardiomyopathies: Secondary | ICD-10-CM | POA: Diagnosis present

## 2018-02-23 DIAGNOSIS — E875 Hyperkalemia: Secondary | ICD-10-CM | POA: Diagnosis not present

## 2018-02-23 DIAGNOSIS — Z881 Allergy status to other antibiotic agents status: Secondary | ICD-10-CM

## 2018-02-23 DIAGNOSIS — J811 Chronic pulmonary edema: Secondary | ICD-10-CM

## 2018-02-23 DIAGNOSIS — Z880 Allergy status to penicillin: Secondary | ICD-10-CM

## 2018-02-23 DIAGNOSIS — Z9114 Patient's other noncompliance with medication regimen: Secondary | ICD-10-CM

## 2018-02-23 DIAGNOSIS — I1310 Hypertensive heart and chronic kidney disease without heart failure, with stage 1 through stage 4 chronic kidney disease, or unspecified chronic kidney disease: Secondary | ICD-10-CM

## 2018-02-23 DIAGNOSIS — Z885 Allergy status to narcotic agent status: Secondary | ICD-10-CM

## 2018-02-23 DIAGNOSIS — I5023 Acute on chronic systolic (congestive) heart failure: Secondary | ICD-10-CM | POA: Diagnosis present

## 2018-02-23 DIAGNOSIS — I132 Hypertensive heart and chronic kidney disease with heart failure and with stage 5 chronic kidney disease, or end stage renal disease: Secondary | ICD-10-CM | POA: Diagnosis not present

## 2018-02-23 DIAGNOSIS — F410 Panic disorder [episodic paroxysmal anxiety] without agoraphobia: Secondary | ICD-10-CM | POA: Diagnosis present

## 2018-02-23 DIAGNOSIS — I10 Essential (primary) hypertension: Secondary | ICD-10-CM | POA: Diagnosis present

## 2018-02-23 DIAGNOSIS — N184 Chronic kidney disease, stage 4 (severe): Secondary | ICD-10-CM | POA: Diagnosis present

## 2018-02-23 DIAGNOSIS — E876 Hypokalemia: Secondary | ICD-10-CM | POA: Diagnosis present

## 2018-02-23 DIAGNOSIS — Z79899 Other long term (current) drug therapy: Secondary | ICD-10-CM

## 2018-02-23 DIAGNOSIS — R64 Cachexia: Secondary | ICD-10-CM | POA: Diagnosis present

## 2018-02-23 DIAGNOSIS — J9600 Acute respiratory failure, unspecified whether with hypoxia or hypercapnia: Secondary | ICD-10-CM

## 2018-02-23 DIAGNOSIS — N186 End stage renal disease: Secondary | ICD-10-CM | POA: Diagnosis present

## 2018-02-23 DIAGNOSIS — I509 Heart failure, unspecified: Secondary | ICD-10-CM

## 2018-02-23 DIAGNOSIS — Q613 Polycystic kidney, unspecified: Secondary | ICD-10-CM

## 2018-02-23 DIAGNOSIS — I472 Ventricular tachycardia: Secondary | ICD-10-CM | POA: Diagnosis not present

## 2018-02-23 DIAGNOSIS — Z888 Allergy status to other drugs, medicaments and biological substances status: Secondary | ICD-10-CM

## 2018-02-23 DIAGNOSIS — I340 Nonrheumatic mitral (valve) insufficiency: Secondary | ICD-10-CM | POA: Diagnosis present

## 2018-02-23 DIAGNOSIS — R34 Anuria and oliguria: Secondary | ICD-10-CM | POA: Diagnosis not present

## 2018-02-23 DIAGNOSIS — Z8249 Family history of ischemic heart disease and other diseases of the circulatory system: Secondary | ICD-10-CM

## 2018-02-23 DIAGNOSIS — Z95810 Presence of automatic (implantable) cardiac defibrillator: Secondary | ICD-10-CM

## 2018-02-23 DIAGNOSIS — N179 Acute kidney failure, unspecified: Secondary | ICD-10-CM | POA: Diagnosis present

## 2018-02-23 DIAGNOSIS — Z681 Body mass index (BMI) 19 or less, adult: Secondary | ICD-10-CM

## 2018-02-23 DIAGNOSIS — J449 Chronic obstructive pulmonary disease, unspecified: Secondary | ICD-10-CM | POA: Diagnosis present

## 2018-02-23 DIAGNOSIS — J90 Pleural effusion, not elsewhere classified: Secondary | ICD-10-CM

## 2018-02-23 DIAGNOSIS — Z515 Encounter for palliative care: Secondary | ICD-10-CM

## 2018-02-23 DIAGNOSIS — R0602 Shortness of breath: Secondary | ICD-10-CM

## 2018-02-23 DIAGNOSIS — D638 Anemia in other chronic diseases classified elsewhere: Secondary | ICD-10-CM | POA: Diagnosis present

## 2018-02-23 DIAGNOSIS — R600 Localized edema: Secondary | ICD-10-CM

## 2018-02-23 DIAGNOSIS — R570 Cardiogenic shock: Secondary | ICD-10-CM | POA: Diagnosis not present

## 2018-02-23 DIAGNOSIS — E785 Hyperlipidemia, unspecified: Secondary | ICD-10-CM | POA: Diagnosis present

## 2018-02-23 DIAGNOSIS — I252 Old myocardial infarction: Secondary | ICD-10-CM

## 2018-02-23 DIAGNOSIS — Z7952 Long term (current) use of systemic steroids: Secondary | ICD-10-CM

## 2018-02-23 DIAGNOSIS — R188 Other ascites: Secondary | ICD-10-CM

## 2018-02-23 DIAGNOSIS — G9349 Other encephalopathy: Secondary | ICD-10-CM | POA: Diagnosis not present

## 2018-02-23 DIAGNOSIS — Z66 Do not resuscitate: Secondary | ICD-10-CM | POA: Diagnosis present

## 2018-02-23 LAB — COMPREHENSIVE METABOLIC PANEL
ALT: 22 U/L (ref 17–63)
ANION GAP: 11 (ref 5–15)
AST: 34 U/L (ref 15–41)
Albumin: 2.9 g/dL — ABNORMAL LOW (ref 3.5–5.0)
Alkaline Phosphatase: 99 U/L (ref 38–126)
BILIRUBIN TOTAL: 1.2 mg/dL (ref 0.3–1.2)
BUN: 80 mg/dL — ABNORMAL HIGH (ref 6–20)
CALCIUM: 8.5 mg/dL — AB (ref 8.9–10.3)
CO2: 31 mmol/L (ref 22–32)
Chloride: 96 mmol/L — ABNORMAL LOW (ref 101–111)
Creatinine, Ser: 3.31 mg/dL — ABNORMAL HIGH (ref 0.61–1.24)
GFR calc Af Amer: 22 mL/min — ABNORMAL LOW (ref >60)
GFR calc non Af Amer: 19 mL/min — ABNORMAL LOW (ref >60)
Glucose, Bld: 109 mg/dL — ABNORMAL HIGH (ref 65–99)
Potassium: 4 mmol/L (ref 3.5–5.1)
Sodium: 138 mmol/L (ref 135–145)
TOTAL PROTEIN: 6.3 g/dL — AB (ref 6.5–8.1)

## 2018-02-23 LAB — CBC
HCT: 38.4 % — ABNORMAL LOW (ref 39.0–52.0)
Hemoglobin: 11.2 g/dL — ABNORMAL LOW (ref 13.0–17.0)
MCH: 22 pg — ABNORMAL LOW (ref 26.0–34.0)
MCHC: 29.2 g/dL — ABNORMAL LOW (ref 30.0–36.0)
MCV: 75.3 fL — AB (ref 78.0–100.0)
PLATELETS: 259 10*3/uL (ref 150–400)
RBC: 5.1 MIL/uL (ref 4.22–5.81)
RDW: 27.6 % — AB (ref 11.5–15.5)
WBC: 6.4 10*3/uL (ref 4.0–10.5)

## 2018-02-23 LAB — I-STAT TROPONIN, ED: TROPONIN I, POC: 0.04 ng/mL (ref 0.00–0.08)

## 2018-02-23 LAB — BRAIN NATRIURETIC PEPTIDE

## 2018-02-23 MED ORDER — FUROSEMIDE 10 MG/ML IJ SOLN
40.0000 mg | Freq: Once | INTRAMUSCULAR | Status: AC
  Administered 2018-02-23: 40 mg via INTRAVENOUS
  Filled 2018-02-23: qty 4

## 2018-02-23 MED ORDER — SODIUM CHLORIDE 0.9% FLUSH
3.0000 mL | Freq: Two times a day (BID) | INTRAVENOUS | Status: DC
  Administered 2018-02-23: 3 mL via INTRAVENOUS

## 2018-02-23 NOTE — ED Notes (Signed)
ED Provider at bedside. 

## 2018-02-23 NOTE — ED Provider Notes (Signed)
[name] EMERGENCY DEPARTMENT Provider Note   CSN: [id number] Arrival date & time: 02/23/18  1752     History   Chief Complaint Chief Complaint  Patient presents with  . Chest Pain    HPI Kelly Leonard is a 62 y.o. male.  HPI   Kelly Leonard is a 62 y.o. male, with a history of CHF, CKD, HTN, COPD, and MI, presenting to the ED with shortness of breath beginning last week.  Also endorses orthopnea and increased edema to his lower extremities, genitals, and abdomen.  Patient was admitted at [facility] 6/12 in the ICU for CHF exacerbation, but left AMA. He states, "I felt like I was getting better, but they weren't getting the fluid off fast enough. They took me out of the ICU, but then said they needed to send me back to the ICU. I didn't like that so I left."  Denies cough, fever, chest pain, N/V/D, or any other complaints.  Cardiologist: Bensimhon    Past Medical History:  Diagnosis Date  . Anxiety   . Bilateral renal masses    a. 01/2018 - noted on CT w/ rec for MRI to better eval.  . Chronic systolic congestive heart failure (HCC)    a. 06/2017 Echo: EF 20-25%, diff HK. Mod to sev MR, mod dil LA. Mildly dil RA. PASP .  . CKD (chronic kidney disease) stage 4, GFR 15-29 ml/min (HCC)   . COPD (chronic obstructive pulmonary disease) (HCC)   . Hyperlipidemia   . Hypertension   . MI (myocardial infarction) (HCC)   . Mitral regurgitation    a. 06/2017 Echo: mod - sev MR.  Marland Kitchen NICM (nonischemic cardiomyopathy) (HCC)    a. 11/2005 MV: reversible inf ischemia, EF 32%; b. Notes indicate pt previously cathed - nl cors; c. 06/2015 s/p MDT [id number] Visia AF MRI VR single lead AICD ([facility]); d. 06/2017 Echo: EF 20-25%.  . Panic attacks   . Polycystic kidney    a. w/ CKD IV.    Patient Active Problem List   Diagnosis Date Noted  . Fluid overload 02/24/2018  . Acute on chronic combined systolic and diastolic CHF (congestive heart failure) (HCC)    . CHF (congestive heart failure) (HCC) 02/18/2018  . Acute on chronic systolic CHF (congestive heart failure) (HCC) 01/22/2018  . Gout 09/19/2017  . Rash 09/16/2017  . Acute renal failure superimposed on stage 4 chronic kidney disease (HCC)   . Hyponatremia 07/05/2017  . Cramps, muscle, general 07/05/2017  . Weakness generalized 07/05/2017  . Insomnia 05/05/2017  . Advanced care planning/counseling discussion 03/24/2017  . 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 Hypertension   . Hyperlipidemia   . Chronic systolic congestive heart failure (HCC)   . Dilated cardiomyopathy (HCC)   . Polycystic kidney   . CKD (chronic kidney disease) stage 4, GFR 15-29 ml/min (HCC)   . 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 Anxiety disorder, unspecified 03/03/2013    Past Surgical History:  Procedure Laterality Date  . CARDIAC CATHETERIZATION    . IMPLANTABLE CARDIOVERTER DEFIBRILLATOR (ICD) GENERATOR CHANGE Right 06/23/2015   Procedure: ICD GENERATOR  INITIAL IMPLANT;  Surgeon: Sharion Settler, MD;  Location: [facility] ORS;  Service: Cardiovascular;  Laterality: Right;        Home Medications    Prior to Admission medications   Medication Sig Start Date End Date Taking? Authorizing Provider  ALPRAZolam Prudy Feeler) 1 MG tablet Take 1 tablet (1 mg total) by  mouth 3 (three) times daily as needed for up to 3 doses for sleep. Patient not taking: Reported on 01/21/2018 11/26/17   Darci Current, MD  B Complex Vitamins (B COMPLEX PO) Take 1 tablet by mouth daily.    [provider]  bumetanide (BUMEX) 1 MG tablet Take 3 tablets (3 mg total) by mouth daily. May take additional 3 mg in the PM as needed for swelling/SOB 02/03/18   Bensimhon, Bevelyn Buckles, MD  busPIRone (BUSPAR) 5 MG tablet Take 1 tablet (5 mg total) 3 (three) times daily by mouth. Patient not taking: Reported on 01/21/2018 07/15/17   Graciella Freer, PA-C  chlordiazePOXIDE (LIBRIUM) 10 MG  capsule Take 1 capsule (10 mg total) by mouth 4 (four) times daily as needed (agitation). 12/31/17   Sharman Cheek, MD  Cholecalciferol (VITAMIN D3) 1000 units CAPS Take 1,000 Units by mouth daily.    [provider]  colchicine 0.6 MG tablet Take 0.6 mg by mouth 2 (two) times daily as needed. Gout flare up 01/09/18   [provider]  Glecaprevir-Pibrentasvir (MAVYRET) 100-40 MG TABS Take 3 tablets by mouth daily with breakfast. Patient not taking: Reported on 01/21/2018 08/27/17   Kuppelweiser, Cassie L, RPH-CPP  isosorbide-hydrALAZINE (BIDIL) 20-37.5 MG tablet Take 2 tablets 3 (three) times daily by mouth.    [provider]  KLOR-CON M20 20 MEQ tablet Take 20 mEq by mouth daily. 01/18/18   [provider]  metolazone (ZAROXOLYN) 2.5 MG tablet Take 1 tablet (2.5 mg total) by mouth as directed. For weight of 140 lbs or greater Patient not taking: Reported on 02/03/2018 11/19/17 02/17/18  Bensimhon, Bevelyn Buckles, MD  predniSONE (DELTASONE) 5 MG tablet Take 5 mg by mouth daily. 02/02/18   [provider]  promethazine (PHENERGAN) 25 MG tablet Take 25 mg by mouth every 6 (six) hours as needed for nausea or vomiting.    [provider]  triamcinolone ointment (KENALOG) 0.1 % Apply 1 application topically 2 (two) times daily. 02/06/18   [provider]    Family History Family History  Problem Relation Age of Onset  . Diabetes Mother   . Hypertension Brother   . Heart disease Maternal Grandmother   . Heart attack Maternal Grandmother     Social History Social History   Tobacco Use  . Smoking status: Never Smoker  . Smokeless tobacco: Never Used  Substance Use Topics  . Alcohol use: No  . Drug use: No     Allergies   Entresto [sacubitril-valsartan]; Penicillins; Seroquel [quetiapine]; Ace inhibitors; Corlanor [ivabradine]; Ambien [zolpidem tartrate]; Bidil [isosorb dinitrate-hydralazine]; Carvedilol; Hydroxyzine; Prednisone;  Sertraline; Trazodone and nefazodone; Uloric [febuxostat]; Benadryl [diphenhydramine hcl]; Gabapentin; Lorazepam; and Torsemide   Review of Systems Review of Systems  Constitutional: Negative for chills, diaphoresis and fever.  Respiratory: Positive for shortness of breath. Negative for cough.   Cardiovascular: Positive for leg swelling. Negative for chest pain.  Gastrointestinal: Positive for abdominal distention. Negative for abdominal pain, diarrhea, nausea and vomiting.  All other systems reviewed and are negative.    Physical Exam Updated Vital Signs BP (!) 121/96   Pulse (!) 108   Temp 97.9 F (36.6 C) (Oral)   Resp (!) 21   Ht 5\' 9"  (1.753 m)   Wt 66.7 kg (147 lb)   SpO2 100%   BMI 21.71 kg/m   Physical Exam  Constitutional: He appears well-developed and well-nourished. No distress.  HENT:  Head: Normocephalic and atraumatic.  Eyes: Conjunctivae are normal.  Neck: Neck supple.  Cardiovascular: Regular rhythm and intact distal pulses. Tachycardia present.  Pulmonary/Chest: He has rales in the right lower field and the left lower field.  Patient is orthopneic.  Requiring 2 L/min supplemental O2 to maintain SPO2 100%. Some conversational dyspnea.  Abdominal: He exhibits distension. There is tenderness. There is no guarding.  Abdominal distention and associated discomfort  Genitourinary:  Genitourinary Comments: Scrotal and penile edema  Musculoskeletal: He exhibits edema.  Bilateral lower extremity pitting edema extending into the upper legs.  Lymphadenopathy:    He has no cervical adenopathy.  Neurological: He is alert.  Skin: Skin is warm and dry. He is not diaphoretic.  Psychiatric: He has a normal mood and affect. His behavior is normal.  Nursing note and vitals reviewed.    ED Treatments / Results  Labs (all labs ordered are listed, but only abnormal results are displayed) Labs Reviewed  CBC - Abnormal; Notable for the following components:      Result  Value   Hemoglobin 11.2 (*)    HCT 38.4 (*)    MCV 75.3 (*)    MCH 22.0 (*)    MCHC 29.2 (*)    RDW 27.6 (*)    All other components within normal limits  COMPREHENSIVE METABOLIC PANEL - Abnormal; Notable for the following components:   Chloride 96 (*)    Glucose, Bld 109 (*)    BUN 80 (*)    Creatinine, Ser 3.31 (*)    Calcium 8.5 (*)    Total Protein 6.3 (*)    Albumin 2.9 (*)    GFR calc non Af Amer 19 (*)    GFR calc Af Amer 22 (*)    All other components within normal limits  BRAIN NATRIURETIC PEPTIDE - Abnormal; Notable for the following components:   B Natriuretic Peptide >4,500.0 (*)    All other components within normal limits  I-STAT TROPONIN, ED    EKG EKG Interpretation  Date/Time:  Monday February 23 2018 17:58:12 EDT Ventricular Rate:  97 PR Interval:  206 QRS Duration: 102 QT Interval:  374 QTC Calculation: 474 R Axis:   -42 Text Interpretation:  Normal sinus rhythm Possible Left atrial enlargement Left axis deviation Anterior infarct , age undetermined Abnormal ECG Confirmed by Kristine Royal [phone]) on 02/23/2018 9:24:49 PM   Radiology Dg Chest 2 View  Result Date: 02/23/2018 CLINICAL DATA:  62 y/o  M; chest pain and shortness of breath. EXAM: CHEST - 2 VIEW COMPARISON:  02/18/2018 chest radiograph. FINDINGS: Cardiomegaly with single lead AICD. Pulmonary vascular congestion. Small bilateral pleural effusions. Bibasilar opacities. No pneumothorax. Bones are unremarkable. IMPRESSION: Cardiomegaly, pulmonary vascular congestion, and small bilateral pleural effusions. Bibasilar opacities probably represent associated atelectasis or edema. Electronically Signed   By: Mitzi Hansen M.D.   On: 02/23/2018 18:39    Procedures Procedures (including critical care time)  Medications Ordered in ED Medications  furosemide (LASIX) injection 40 mg (40 mg Intravenous Given 02/23/18 2136)     Initial Impression / Assessment and Plan / ED Course  I have  reviewed the triage vital signs and the nursing notes.  Pertinent labs & imaging results that were available during my care of the patient were reviewed by me and considered in my medical decision making (see chart for details).  Clinical Course as of Feb 24 17  Mon Feb 23, 2018  2208 Spoke with Dr. Mariea Clonts, hospitalist. States she would like cardiology to weigh in on treatment and placement for patient.   [  SJ]  2324 Spoke with Dr. Mariea Clonts to update her. Told her we have had difficulty getting ahold of cardiology. Cardiology fellow has been paged multiple times by myself and the secretary without answer.   [SJ]  Tue Feb 24, 2018  0000 Spoke with Dr. Shirlee Latch, cardiology fellow. Recommends admission via hospitalist, diuresis overnight, and cardiology evaluation in the morning.  Recommends initial Lasix regimen of 80 mg IV every 8 hours.   [SJ]    Clinical Course User Index [SJ] Jailani Hogans C, PA-C    Patient presents with complaint of shortness of breath.  Orthopnea, peripheral edema, pulmonary edema, significantly elevated BNP, and new oxygen requirement.  Patient admitted for CHF exacerbation.   Findings and plan of care discussed with Kristine Royal, MD.   Vitals:   02/23/18 2023 02/23/18 2024 02/23/18 2045 02/23/18 2115  BP: (!) 128/101  (!) 121/96 (!) 128/102  Pulse:   (!) 108 99  Resp:   (!) 21 20  Temp:      TempSrc:      SpO2:  100% 100% 100%  Weight:      Height:       Vitals:   02/23/18 2245 02/23/18 2300 02/23/18 2315 02/23/18 2345  BP: (!) 120/93 116/88 (!) 120/96 108/84  Pulse: (!) 102 (!) 108 (!) 101   Resp: 17 (!) 21 10 16   Temp:      TempSrc:      SpO2: 100% 100% 100%   Weight:      Height:         Final Clinical Impressions(s) / ED Diagnoses   Final diagnoses:  Acute on chronic systolic congestive heart failure [facility])    ED Discharge Orders    None       Concepcion Living 02/24/18 0029    Wynetta Fines, MD 02/24/18 1454

## 2018-02-23 NOTE — Progress Notes (Signed)
Patient advised to stay for diuresis. But wants to leave AMA. High risk for deterioration and death discussed but patient doesn't want to stay

## 2018-02-23 NOTE — Progress Notes (Signed)
[facility], [place] 02/23/18  Subjective:  Patient appears to be breathing quite comfortably at the moment. His creatinine clearance was found to be quite low at 6. However he does not want to start on dialysis at the moment and would like to discuss this further with his outpatient nephrologist.  Objective:  Vital signs in last 24 hours:  Temp:  [97.3 F (36.3 C)-98.4 F (36.9 C)] 97.5 F (36.4 C) (06/17 0742) Pulse Rate:  [96-101] 101 (06/17 0742) Resp:  [14-18] 14 (06/17 0742) BP: (113-123)/(79-99) 119/82 (06/17 0742) SpO2:  [99 %-100 %] 99 % (06/17 0742) Weight:  [66.6 kg (146 lb 12.8 oz)] 66.6 kg (146 lb 12.8 oz) (06/17 0441)  Weight change: -1.004 kg (-2 lb 3.4 oz) Filed Weights   02/22/18 0400 02/22/18 1209 02/23/18 0441  Weight: 68 kg (149 lb 14.6 oz) 67 kg (147 lb 11.2 oz) 66.6 kg (146 lb 12.8 oz)    Intake/Output:    Intake/Output Summary (Last 24 hours) at 02/23/2018 1310 Last data filed at 02/23/2018 0957 Gross per 24 hour  Intake 120 ml  Output 500 ml  Net -380 ml     Physical Exam: General:  No acute distress, laying in the bed  HEENT  moist mucous membranes, nasal cannula oxygen  Neck  supple  Pulm/lungs  clear to auscultation bilateral, normal effort  CVS/Heart  irregular rhythm  Abdomen:   Distended, nontender  Extremities:  trace edema  Neurologic:  Alert, able to follow commands             Basic Metabolic Panel:  Recent Labs  Lab 02/18/18 1616 02/19/18 0650 02/20/18 0450 02/21/18 0551 02/22/18 0442  NA 135 133* 134* 133* 136  K 4.7 4.1 4.4 4.0 3.4*  CL 95* 97* 97* 97* 97*  CO2 25 22 19* 23 26  GLUCOSE 94 115* 89 119* 103*  BUN 66* 71* 76* 88* 87*  CREATININE 3.19* 3.38* 3.62* 3.58* 3.32*  CALCIUM 8.9 8.7* 8.8* 8.3* 8.3*  MG  --   --   --  2.1  --   PHOS  --   --   --  5.4*  --      CBC: Recent Labs  Lab 02/17/18 1738 02/18/18 1616 02/19/18 0650 02/20/18 0450 02/21/18 0551  WBC 6.2 7.0 7.3  7.7 7.2  NEUTROABS  --  5.4  --   --   --   HGB 11.3* 12.3* 12.7* 12.7* 11.4*  HCT 35.5* 38.4* 40.3 39.8* 35.8*  MCV 71.6* 72.3* 72.2* 71.5* 71.9*  PLT 281 262 279 314 254      Lab Results  Component Value Date   HEPBSAG Negative 07/08/2017   HEPBIGM Negative 07/08/2017      Microbiology:  Recent Results (from the past 240 hour(s))  MRSA PCR Screening     Status: None   Collection Time: 02/20/18  4:07 PM  Result Value Ref Range Status   MRSA by PCR NEGATIVE NEGATIVE Final    Comment:        The GeneXpert MRSA Assay (FDA approved for NASAL specimens only), is one component of a comprehensive MRSA colonization surveillance program. It is not intended to diagnose MRSA infection nor to guide or monitor treatment for MRSA infections. Performed at [facility], [address] [place], [place] [address]     Coagulation Studies: No results for input(s): LABPROT, INR in the last 72 hours.  Urinalysis: No results for input(s): COLORURINE, LABSPEC, PHURINE, GLUCOSEU, HGBUR,  BILIRUBINUR, KETONESUR, PROTEINUR, UROBILINOGEN, NITRITE, LEUKOCYTESUR in the last 72 hours.  Invalid input(s): APPERANCEUR    Imaging: No results found.   Medications:   . DOBUTamine Stopped (02/22/18 0954)  . furosemide (LASIX) infusion Stopped (02/22/18 1100)   . cholecalciferol  1,000 Units Oral Daily  . feeding supplement (NEPRO CARB STEADY)  237 mL Oral Q24H  . heparin  5,000 Units Subcutaneous Q8H  . mouth rinse  15 mL Mouth Rinse BID  . sodium chloride flush  3 mL Intravenous Q12H  . tamsulosin  0.4 mg Oral Daily  . triamcinolone ointment  1 application Topical BID   acetaminophen **OR** acetaminophen, chlordiazePOXIDE, guaiFENesin-dextromethorphan, ondansetron **OR** ondansetron (ZOFRAN) IV  Assessment/ Plan:  62 y.o. African-American male with chronic systolic congestive heart failure, ICD, COPD, hypertension, polycystic kidney disease, chronic kidney disease now  presents with worsening shortness of breath and volume overload. LVEF 20-25 % from 06/2017  1.  Chronic kidney disease stage 5 2.  Generalized edema 3.  Urinary retention 4.  Chronic systolic congestive heart failure 5.  Anemia of CKD.   -Patient was breathing more comfortably today.  We again discussed his overall renal status and diminished renal function.  Most recent creatinine clearance was quite low at 6 cc/min.  We advised the patient that he will likely need to consider renal replacement therapy in the relative near future.  He will be discussing this with his outpatient nephrologist in [place] in the relative near future.  Otherwise continue supportive care as per cardiology.   LOS: 5 Kelly Leonard 6/17/20191:10 PM  [facility] [place], [place] [phone]  Note: This note was prepared with Dragon dictation. Any transcription errors are unintentional

## 2018-02-23 NOTE — Progress Notes (Signed)
Cardiovascular and Pulmonary Nurse Navigator Note:  Patient is followed by Dr. Gala Romney in the Advanced Heart Failure Clinic.   Next appointment with Dr. Gala Romney is 04/13/2018 at 9:20 a.m. Patient will need to be seen before this date.  This RN notified Driscilla Moats, HF Nurse Navigator at [facility] in [place], [place] and Lasandra Beech, Clinical Social Worker, that patient had signed out AMA.  Note:  Patient was on Dobutamine and Lasix gtt over the weekend. Patient is still fluid overloaded. BNP yesterday 3404. BUN 87, Creatinine 3.32 today.   Army Melia, RN, BSN, [facility] Cardiovascular and Pulmonary Nurse Navigator

## 2018-02-23 NOTE — ED Triage Notes (Signed)
Pt in c/o chest pain and shortness of breath, also increased fluid, pt was at [facility] admitted for the last few days and left AMA, pt did not feel they were managing his care well, pt speaking in short sentences, alert and oriented

## 2018-02-23 NOTE — Progress Notes (Signed)
Patient has signed the AMA form.  His wife and daughter are here to take him home.

## 2018-02-23 NOTE — Telephone Encounter (Signed)
I called patient and unfortunately did not get an answer.  I also cannot leave a message.  I was hoping to speak to patient regarding recent hospitalization and attempt to schedule a follow-up appt in the AHF clinic.  I will try again at a later time.

## 2018-02-23 NOTE — Care Management (Addendum)
BUN 87, Creatinine 3.32 today. No dialysis at this time. Lasix drip and Dobutrex drip discontinued.  Gwenette Greet RN MSN CCM Care Management [phone]

## 2018-02-23 NOTE — ED Notes (Addendum)
Pt's room air sats drop down to 95%. Pt took some deep breaths on room air and sats went back up to 99-100%. Pt reported he did feel sob without oxygen on. Pt placed back on 2LNC.

## 2018-02-23 NOTE — Discharge Summary (Signed)
[facility] - [place] at [facility]   PATIENT NAME: Kelly Leonard    MR#:  [id number]  DATE OF BIRTH:  1956/08/13  DATE OF ADMISSION:  02/18/2018 ADMITTING PHYSICIAN: Houston Siren, MD  DATE OF DISCHARGE: Left AGAINST MEDICAL ADVICE on 02/23/2018  PRIMARY CARE PHYSICIAN: Corky Downs, MD   ADMISSION DIAGNOSIS:  Systolic congestive heart failure, unspecified HF chronicity (HCC) [I50.20]  DISCHARGE DIAGNOSIS:  Active Problems:   CHF (congestive heart failure) (HCC)   Acute on chronic combined systolic and diastolic CHF (congestive heart failure) (HCC)   SECONDARY DIAGNOSIS:   Past Medical History:  Diagnosis Date  . Anxiety   . Bilateral renal masses    a. 01/2018 - noted on CT w/ rec for MRI to better eval.  . Chronic systolic congestive heart failure (HCC)    a. 06/2017 Echo: EF 20-25%, diff HK. Mod to sev MR, mod dil LA. Mildly dil RA. PASP .  . CKD (chronic kidney disease) stage 4, GFR 15-29 ml/min (HCC)   . COPD (chronic obstructive pulmonary disease) (HCC)   . Hyperlipidemia   . Hypertension   . MI (myocardial infarction) (HCC)   . Mitral regurgitation    a. 06/2017 Echo: mod - sev MR.  Marland Kitchen NICM (nonischemic cardiomyopathy) (HCC)    a. 11/2005 MV: reversible inf ischemia, EF 32%; b. Notes indicate pt previously cathed - nl cors; c. 06/2015 s/p MDT [id number] Visia AF MRI VR single lead AICD ([facility]); d. 06/2017 Echo: EF 20-25%.  . Panic attacks   . Polycystic kidney    a. w/ CKD IV.     ADMITTING HISTORY  HISTORY OF PRESENT ILLNESS:  Kelly Leonard  is a 61 y.o. 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  Patient was admitted to [facility] in the middle of May for similar reasons and diuresis  with high doses of IV Lasix which he responded well to.  His weight was down to 127 pounds then.  He presented to the ER yesterday due to shortness of breath and worsening lower extremity edema, he received some IV Lasix felt better and was discharged home.  He now returns back to the emergency room as he has gained 5 pounds overnight, his weight is up to 152 pounds as per him.  He feels significantly short of breath even at rest with worsening lower extremity edema now with penile edema and scrotal edema.  Hospitalist services were contacted for treatment evaluation.  Patient denies any chest pains, admits to some nausea but no vomiting.  He he does complain of abdominal distention but no pain.  He denies any dysuria, hematuria, melena, hematochezia, fever chills cough or any other associated symptoms.     HOSPITAL COURSE:   62 year old male with past medical history of severe dilated cardiomyopathy EF of 20%, status post AICD, chronic systolic CHF, COPD, CKD stage IV, polycystic kidney disease admitted due to shortness of breath and worsening lower extremity edema.  *CHF-acute on chronic systolic dysfunction due to NICM: Ef 20-25%.  Still has anasarca Patient was transferred to ICU on dobutamine drip and Lasix drip.  Diuresed well and was -5 L.  Patient was transferred out of the ICU.  Needed continued inpatient diuresis but patient wanted to be discharged home.  Unsafe.  Shortness of breath on ambulation.  Still has significant volume overload.  Discussed with cardiology Dr. Mariah Milling.  Advised patient needs inpatient diuresis.  Patient is at high risk for deterioration and death if he goes home.  Patient adamant that he wanted to go home and left AGAINST MEDICAL ADVICE.  Advised him to increase his Bumex from 3 mg once a day to 3 mg 2 times a day and closely follow with his outpatient physicians.  *Chronic kidney disease stage IV-secondary to polycystic kidney disease. Patient's creatinine is  close to baseline.  Monitor while being diuresed Will likely need dialysis in the near future  * Essential hypertension-continue Imdur, patient refused hydralazine  *COPD-no acute exacerbation  * Recent history of C. difficile colitis-no diarrhea presently.  Patient has finished treatment with oral vancomycin.  *Elevated troponin secondary to supply demand ischemia from CKD. No acute chest pain.  Patient left AGAINST MEDICAL ADVICE.    CONSULTS OBTAINED:  Treatment Team:  Antonieta Iba, MD Mosetta Pigeon, MD  DRUG ALLERGIES:   Allergies  Allergen Reactions  . Entresto [Sacubitril-Valsartan] Swelling    Swelling of the face  . Penicillins Anaphylaxis and Swelling    Has patient had a PCN reaction causing immediate rash, facial/tongue/throat swelling, SOB or lightheadedness with hypotension: Yes Has patient had a PCN reaction causing severe rash involving mucus membranes or skin necrosis: No Has patient had a PCN reaction that required hospitalization: No Has patient had a PCN reaction occurring within the last 10 years: No If all of the above answers are "NO", then may proceed with Cephalosporin use.   . Seroquel [Quetiapine] Nausea And Vomiting and Hives    Hives   . Ace Inhibitors Rash  . Corlanor [Ivabradine] Swelling    Facial swelling  . Ambien [Zolpidem Tartrate] Swelling  . Bidil [Isosorb Dinitrate-Hydralazine] Hives  . Carvedilol Other (See Comments)    "dizziness, light headed, and nausea"  . Hydroxyzine Nausea Only  . Prednisone Other (See Comments)    Causes fluid build up  . Sertraline Diarrhea    itching  . Trazodone And Nefazodone Swelling and Other (See Comments)    "up for days" and lip swelling  . Uloric [Febuxostat] Other (See Comments)    Jittery, jerky, makes him lose his mind  . Benadryl [Diphenhydramine Hcl] Other (See Comments)    "makes me crazy and hyper"  . Gabapentin Swelling    Lip and face swelling  . Lorazepam Rash     Rash and severe anxiety  . Torsemide Hives, Itching, Nausea And Vomiting and Rash    DISCHARGE MEDICATIONS:   Allergies as of 02/23/2018      Reactions   Entresto [sacubitril-valsartan] Swelling   Swelling of the face   Penicillins Anaphylaxis, Swelling   Has patient had a PCN reaction causing immediate rash, facial/tongue/throat swelling, SOB or lightheadedness with hypotension: Yes Has patient had a PCN reaction causing severe rash involving mucus membranes or skin necrosis: No Has patient had a PCN reaction that required hospitalization: No Has patient had a PCN reaction occurring within the last 10 years: No If all of the above answers are "NO", then may proceed with Cephalosporin use.   Seroquel [quetiapine] Nausea And Vomiting, Hives   Hives   Ace Inhibitors Rash   Corlanor [ivabradine] Swelling   Facial swelling   Ambien [zolpidem Tartrate] Swelling   Bidil [isosorb Dinitrate-hydralazine] Hives   Carvedilol Other (See Comments)   "dizziness, light headed, and nausea"   Hydroxyzine Nausea  Only   Prednisone Other (See Comments)   Causes fluid build up   Sertraline Diarrhea   itching   Trazodone And Nefazodone Swelling, Other (See Comments)   "up for days" and lip swelling   Uloric [febuxostat] Other (See Comments)   Jittery, jerky, makes him lose his mind   Benadryl [diphenhydramine Hcl] Other (See Comments)   "makes me crazy and hyper"   Gabapentin Swelling   Lip and face swelling   Lorazepam Rash   Rash and severe anxiety   Torsemide Hives, Itching, Nausea And Vomiting, Rash      Medication List    STOP taking these medications   vancomycin 125 MG capsule Commonly known as:  VANCOCIN     TAKE these medications   ALPRAZolam 1 MG tablet Commonly known as:  XANAX Take 1 tablet (1 mg total) by mouth 3 (three) times daily as needed for up to 3 doses for sleep.   B COMPLEX PO Take 1 tablet by mouth daily.   bumetanide 1 MG tablet Commonly known as:   BUMEX Take 3 tablets (3 mg total) by mouth daily. May take additional 3 mg in the PM as needed for swelling/SOB   busPIRone 5 MG tablet Commonly known as:  BUSPAR Take 1 tablet (5 mg total) 3 (three) times daily by mouth.   chlordiazePOXIDE 10 MG capsule Commonly known as:  LIBRIUM Take 1 capsule (10 mg total) by mouth 4 (four) times daily as needed (agitation).   colchicine 0.6 MG tablet Take 0.6 mg by mouth 2 (two) times daily as needed. Gout flare up   Glecaprevir-Pibrentasvir 100-40 MG Tabs Commonly known as:  MAVYRET Take 3 tablets by mouth daily with breakfast.   isosorbide-hydrALAZINE 20-37.5 MG tablet Commonly known as:  BIDIL Take 2 tablets 3 (three) times daily by mouth.   KLOR-CON M20 20 MEQ tablet Generic drug:  potassium chloride SA Take 20 mEq by mouth daily.   metolazone 2.5 MG tablet Commonly known as:  ZAROXOLYN Take 1 tablet (2.5 mg total) by mouth as directed. For weight of 140 lbs or greater   predniSONE 5 MG tablet Commonly known as:  DELTASONE Take 5 mg by mouth daily.   promethazine 25 MG tablet Commonly known as:  PHENERGAN Take 25 mg by mouth every 6 (six) hours as needed for nausea or vomiting.   triamcinolone ointment 0.1 % Commonly known as:  KENALOG Apply 1 application topically 2 (two) times daily.   Vitamin D3 1000 units Caps Take 1,000 Units by mouth daily.       Today   VITAL SIGNS:  Blood pressure 119/82, pulse (!) 101, temperature (!) 97.5 F (36.4 C), temperature source Oral, resp. rate 14, height 5\' 9"  (1.753 m), weight 66.6 kg (146 lb 12.8 oz), SpO2 99 %.  I/O:    Intake/Output Summary (Last 24 hours) at 02/23/2018 1229 Last data filed at 02/23/2018 0957 Gross per 24 hour  Intake 120 ml  Output 500 ml  Net -380 ml    PHYSICAL EXAMINATION:  Physical Exam  GENERAL:  62 y.o.-year-old patient lying in the bed with no acute distress.  LUNGS: Normal breath sounds bilaterally, no wheezing, rales,rhonchi or crepitation.  No use of accessory muscles of respiration.  CARDIOVASCULAR: S1, S2 normal. No murmurs, rubs, or gallops.  ABDOMEN: Soft, non-tender, . Bowel sounds present. No organomegaly or mass.  Distended NEUROLOGIC: Moves all 4 extremities. PSYCHIATRIC: The patient is alert and oriented x 3.  SKIN: No obvious rash, lesion,  or ulcer.   DATA REVIEW:   CBC Recent Labs  Lab 02/21/18 0551  WBC 7.2  HGB 11.4*  HCT 35.8*  PLT 254    Chemistries  Recent Labs  Lab 02/18/18 1616  02/21/18 0551 02/22/18 0442  NA 135   < > 133* 136  K 4.7   < > 4.0 3.4*  CL 95*   < > 97* 97*  CO2 25   < > 23 26  GLUCOSE 94   < > 119* 103*  BUN 66*   < > 88* 87*  CREATININE 3.19*   < > 3.58* 3.32*  CALCIUM 8.9   < > 8.3* 8.3*  MG  --   --  2.1  --   AST 57*  --   --   --   ALT 28  --   --   --   ALKPHOS 104  --   --   --   BILITOT 1.2  --   --   --    < > = values in this interval not displayed.    Cardiac Enzymes Recent Labs  Lab 02/18/18 1616  TROPONINI 0.05*    Microbiology Results  Results for orders placed or performed during the hospital encounter of 02/18/18  MRSA PCR Screening     Status: None   Collection Time: 02/20/18  4:07 PM  Result Value Ref Range Status   MRSA by PCR NEGATIVE NEGATIVE Final    Comment:        The GeneXpert MRSA Assay (FDA approved for NASAL specimens only), is one component of a comprehensive MRSA colonization surveillance program. It is not intended to diagnose MRSA infection nor to guide or monitor treatment for MRSA infections. Performed at [facility], [address], [place], [place] [address]     RADIOLOGY:  No results found.  Follow up with PCP in 1 week.  Management plans discussed with the patient, family and they are in agreement.  CODE STATUS:     Code Status Orders  (From admission, onward)        Start     Ordered   02/18/18 2007  Full code  Continuous     02/18/18 2006    Code Status History    Date Active Date  Inactive Code Status Order ID Comments User Context   01/22/2018 0217 01/25/2018 1833 Full Code [id number]  Eduard Clos, MD ED   07/07/2017 1420 07/15/2017 1737 Full Code [id number]  Sherald Hess, NP Inpatient   07/05/2017 1716 07/07/2017 1244 Full Code [id number]  Marguarite Arbour, MD Inpatient   05/30/2017 0144 05/31/2017 1452 Full Code [id number]  Oralia Manis, MD Inpatient   05/12/2017 1809 05/14/2017 1631 Full Code [id number]  Katha Hamming, MD ED   06/23/2015 1508 06/24/2015 1648 Full Code [id number]  Sharion Settler, MD Inpatient      TOTAL TIME TAKING CARE OF THIS PATIENT ON DAY OF DISCHARGE: more than 30 minutes.   Orie Fisherman M.D on 02/23/2018 at 12:29 PM  Between 7am to 6pm - Pager - [phone]  After 6pm go to [email or web address] - password [email or web address] [facility]  [facility]  Office  [phone]  CC: Primary care physician; Corky Downs, MD  Note: This dictation was prepared with Dragon dictation along with smaller phrase technology. Any transcriptional errors that result from this process are unintentional.

## 2018-02-23 NOTE — ED Provider Notes (Signed)
Patient placed in Quick Look pathway, seen and evaluated   Chief Complaint: shortness of breath  HPI:   Pt left [facility] this am from ICu to come here.  Pt reports he was not getting better  ROS: short of breath, fever  Physical Exam:   Gen: No distress  Neuro: Awake and Alert  Skin: Warm    Focused Exam: Lungs decreased breath sounds, tachycardia   Initiation of care has begun. The patient has been counseled on the process, plan, and necessity for staying for the completion/evaluation, and the remainder of the medical screening examination   Osie Cheeks 02/23/18 1819    Wynetta Fines, MD 02/23/18 2356

## 2018-02-23 NOTE — Progress Notes (Signed)
Progress Note  Patient Name: Kelly Leonard Date of Encounter: 02/23/2018  Primary Cardiologist: Arvilla Meres, MD  Subjective   Breathing stable.  He is very eager to go home.  Says he has bills to pay and things to do.  He's still 23 lbs above his prior discharge weight from [facility] on 5/19.  Inpatient Medications    Scheduled Meds: . cholecalciferol  1,000 Units Oral Daily  . feeding supplement (NEPRO CARB STEADY)  237 mL Oral Q24H  . heparin  5,000 Units Subcutaneous Q8H  . mouth rinse  15 mL Mouth Rinse BID  . sodium chloride flush  3 mL Intravenous Q12H  . tamsulosin  0.4 mg Oral Daily  . triamcinolone ointment  1 application Topical BID   Continuous Infusions: . DOBUTamine Stopped (02/22/18 0954)  . furosemide (LASIX) infusion Stopped (02/22/18 1100)   PRN Meds: acetaminophen **OR** acetaminophen, chlordiazePOXIDE, guaiFENesin-dextromethorphan, ondansetron **OR** ondansetron (ZOFRAN) IV   Vital Signs    Vitals:   02/22/18 1939 02/23/18 0441 02/23/18 0444 02/23/18 0742  BP: 113/90  118/79 119/82  Pulse: 98  (!) 101 (!) 101  Resp: 18  18 14   Temp: 98.4 F (36.9 C)  97.6 F (36.4 C) (!) 97.5 F (36.4 C)  TempSrc: Oral  Oral Oral  SpO2: 100%  99% 99%  Weight:  146 lb 12.8 oz (66.6 kg)    Height:        Intake/Output Summary (Last 24 hours) at 02/23/2018 0958 Last data filed at 02/23/2018 0957 Gross per 24 hour  Intake 120 ml  Output 825 ml  Net -705 ml   Filed Weights   02/22/18 0400 02/22/18 1209 02/23/18 0441  Weight: 149 lb 14.6 oz (68 kg) 147 lb 11.2 oz (67 kg) 146 lb 12.8 oz (66.6 kg)    Physical Exam   GEN: Well nourished, well developed, in no acute distress.  HEENT: Grossly normal.  Neck: Supple, JVD to jaw, carotid bruits, or masses. Cardiac: RRR, +S3/S4, 2/6 syst murmur @ LLSB  apex. no clubbing, cyanosis. 2+ bilat LE edema to thighs w/ flank/sacral edema to lower back.  Radials/DP/PT 2+ and equal bilaterally.  Respiratory:  Respirations  regular and unlabored, Markedly diminished @ bilat bases with crackles above that. GI: Firm, protuberant, nontender, BS + x 4. MS: no deformity or atrophy. Skin: warm and dry, no rash. Neuro:  Strength and sensation are intact. Psych: AAOx3.  Normal affect.  Labs    Chemistry Recent Labs  Lab 02/18/18 1616  02/20/18 0450 02/21/18 0551 02/22/18 0442  NA 135   < > 134* 133* 136  K 4.7   < > 4.4 4.0 3.4*  CL 95*   < > 97* 97* 97*  CO2 25   < > 19* 23 26  GLUCOSE 94   < > 89 119* 103*  BUN 66*   < > 76* 88* 87*  CREATININE 3.19*   < > 3.62* 3.58* 3.32*  CALCIUM 8.9   < > 8.8* 8.3* 8.3*  PROT 7.4  --   --   --   --   ALBUMIN 3.4*  --   --   --   --   AST 57*  --   --   --   --   ALT 28  --   --   --   --   ALKPHOS 104  --   --   --   --   BILITOT 1.2  --   --   --   --  GFRNONAA 19*   < > 17* 17* 19*  GFRAA 23*   < > 19* 20* 22*  ANIONGAP 15   < > 18* 13 13   < > = values in this interval not displayed.     Hematology Recent Labs  Lab 02/19/18 0650 02/20/18 0450 02/21/18 0551  WBC 7.3 7.7 7.2  RBC 5.58 5.56 4.98  HGB 12.7* 12.7* 11.4*  HCT 40.3 39.8* 35.8*  MCV 72.2* 71.5* 71.9*  MCH 22.8* 22.9* 23.0*  MCHC 31.6* 32.0 32.0  RDW 29.4* 27.9* 28.3*  PLT 279 314 254    Cardiac Enzymes Recent Labs  Lab 02/17/18 1738 02/18/18 1616  TROPONINI 0.05* 0.05*    BNP Recent Labs  Lab 02/17/18 1738 02/18/18 1616 02/22/18 0442  BNP 4,302.0* 3,332.0* 3,404.0*      Radiology    No results found.  Telemetry    RSR - Personally Reviewed  Patient Profile     61 y.o.malewith a history of NICM, HFrEF, CKD IV in the setting of polycystic kidney dzs, HTN, HL, anxiety/panic attacks,and COPD admitted June 12 with acute on chronic systolic heart failure and massive volume overload.  Assessment & Plan    1.  Acute on chronic systolic CHF/NICM: EF 20-25%.  Recent RHC @ [facility] w/ CO/CI of 2.7/1.6 respectively.  D/c wt of 123 lbs.  He did not require HD.  D/c'd home  on Bumex 3mg  Daily.  Admitted 6/14 with recurrent CHF and massive volume overload.  He bumped creat with IV lasix gtt alone and was placed on dobutamine on Friday.  This was d/c'd yesterday morning.  He remains massively volume overloaded.  Though wt improved @ 146, down from 152 on 6/15, he remains 23 lbs above prior d/c wt.  We had a long discussion with Mr. Kohlhoff and have rec continued hospitalization with re-institution of dobutamine and lasix infusions.  He says he has things to do @ home and wishes to be discharged.  This is not ideal.  He is previously intolerant to torsemide (rash/nausea/vomiting) and previously responded poorly to oral lasix.  We will add bumex 3mg  BID.  He will need CHF clinic f/u locally within the next week and with CHF clinic in [place] within the next 3-4 wks. He is not on any CHF meds due to multiple intolerances.  2.  Stage IV CKD:  Stable.  Renal following.  3.  Essential HTN:  Stable.  Was on bidil @ home but now refusing b/c he says it was causing hives. BP stable.  Signed, Nicolasa Ducking, NP  02/23/2018, 9:58 AM    For questions or updates, please contact   Please consult [email or web address] for contact info under Cardiology/STEMI.

## 2018-02-24 ENCOUNTER — Inpatient Hospital Stay (HOSPITAL_COMMUNITY): Payer: BLUE CROSS/BLUE SHIELD

## 2018-02-24 ENCOUNTER — Other Ambulatory Visit: Payer: Self-pay

## 2018-02-24 DIAGNOSIS — N184 Chronic kidney disease, stage 4 (severe): Secondary | ICD-10-CM | POA: Diagnosis not present

## 2018-02-24 DIAGNOSIS — R188 Other ascites: Secondary | ICD-10-CM | POA: Diagnosis present

## 2018-02-24 DIAGNOSIS — R64 Cachexia: Secondary | ICD-10-CM | POA: Diagnosis present

## 2018-02-24 DIAGNOSIS — N186 End stage renal disease: Secondary | ICD-10-CM | POA: Diagnosis present

## 2018-02-24 DIAGNOSIS — Q613 Polycystic kidney, unspecified: Secondary | ICD-10-CM | POA: Diagnosis not present

## 2018-02-24 DIAGNOSIS — I1310 Hypertensive heart and chronic kidney disease without heart failure, with stage 1 through stage 4 chronic kidney disease, or unspecified chronic kidney disease: Secondary | ICD-10-CM | POA: Diagnosis not present

## 2018-02-24 DIAGNOSIS — Z515 Encounter for palliative care: Secondary | ICD-10-CM | POA: Diagnosis not present

## 2018-02-24 DIAGNOSIS — F419 Anxiety disorder, unspecified: Secondary | ICD-10-CM | POA: Diagnosis not present

## 2018-02-24 DIAGNOSIS — I252 Old myocardial infarction: Secondary | ICD-10-CM | POA: Diagnosis not present

## 2018-02-24 DIAGNOSIS — J810 Acute pulmonary edema: Secondary | ICD-10-CM | POA: Diagnosis not present

## 2018-02-24 DIAGNOSIS — Z681 Body mass index (BMI) 19 or less, adult: Secondary | ICD-10-CM | POA: Diagnosis not present

## 2018-02-24 DIAGNOSIS — M109 Gout, unspecified: Secondary | ICD-10-CM | POA: Diagnosis present

## 2018-02-24 DIAGNOSIS — R570 Cardiogenic shock: Secondary | ICD-10-CM | POA: Diagnosis not present

## 2018-02-24 DIAGNOSIS — I472 Ventricular tachycardia: Secondary | ICD-10-CM | POA: Diagnosis not present

## 2018-02-24 DIAGNOSIS — I5023 Acute on chronic systolic (congestive) heart failure: Secondary | ICD-10-CM | POA: Diagnosis present

## 2018-02-24 DIAGNOSIS — N19 Unspecified kidney failure: Secondary | ICD-10-CM | POA: Diagnosis not present

## 2018-02-24 DIAGNOSIS — F410 Panic disorder [episodic paroxysmal anxiety] without agoraphobia: Secondary | ICD-10-CM | POA: Diagnosis present

## 2018-02-24 DIAGNOSIS — D638 Anemia in other chronic diseases classified elsewhere: Secondary | ICD-10-CM | POA: Diagnosis present

## 2018-02-24 DIAGNOSIS — N179 Acute kidney failure, unspecified: Secondary | ICD-10-CM | POA: Diagnosis present

## 2018-02-24 DIAGNOSIS — I361 Nonrheumatic tricuspid (valve) insufficiency: Secondary | ICD-10-CM | POA: Diagnosis not present

## 2018-02-24 DIAGNOSIS — Z7189 Other specified counseling: Secondary | ICD-10-CM | POA: Diagnosis not present

## 2018-02-24 DIAGNOSIS — I340 Nonrheumatic mitral (valve) insufficiency: Secondary | ICD-10-CM | POA: Diagnosis present

## 2018-02-24 DIAGNOSIS — R0602 Shortness of breath: Secondary | ICD-10-CM | POA: Diagnosis not present

## 2018-02-24 DIAGNOSIS — E875 Hyperkalemia: Secondary | ICD-10-CM | POA: Diagnosis not present

## 2018-02-24 DIAGNOSIS — G9349 Other encephalopathy: Secondary | ICD-10-CM | POA: Diagnosis not present

## 2018-02-24 DIAGNOSIS — B182 Chronic viral hepatitis C: Secondary | ICD-10-CM | POA: Diagnosis not present

## 2018-02-24 DIAGNOSIS — B1920 Unspecified viral hepatitis C without hepatic coma: Secondary | ICD-10-CM | POA: Diagnosis present

## 2018-02-24 DIAGNOSIS — I428 Other cardiomyopathies: Secondary | ICD-10-CM | POA: Diagnosis present

## 2018-02-24 DIAGNOSIS — I132 Hypertensive heart and chronic kidney disease with heart failure and with stage 5 chronic kidney disease, or end stage renal disease: Secondary | ICD-10-CM | POA: Diagnosis present

## 2018-02-24 DIAGNOSIS — J449 Chronic obstructive pulmonary disease, unspecified: Secondary | ICD-10-CM | POA: Diagnosis present

## 2018-02-24 DIAGNOSIS — Z66 Do not resuscitate: Secondary | ICD-10-CM | POA: Diagnosis present

## 2018-02-24 DIAGNOSIS — I10 Essential (primary) hypertension: Secondary | ICD-10-CM | POA: Diagnosis not present

## 2018-02-24 DIAGNOSIS — R34 Anuria and oliguria: Secondary | ICD-10-CM | POA: Diagnosis not present

## 2018-02-24 DIAGNOSIS — E876 Hypokalemia: Secondary | ICD-10-CM | POA: Diagnosis present

## 2018-02-24 DIAGNOSIS — R0902 Hypoxemia: Secondary | ICD-10-CM | POA: Diagnosis not present

## 2018-02-24 DIAGNOSIS — E785 Hyperlipidemia, unspecified: Secondary | ICD-10-CM | POA: Diagnosis present

## 2018-02-24 LAB — BASIC METABOLIC PANEL
Anion gap: 12 (ref 5–15)
BUN: 80 mg/dL — ABNORMAL HIGH (ref 6–20)
CHLORIDE: 97 mmol/L — AB (ref 101–111)
CO2: 26 mmol/L (ref 22–32)
Calcium: 8.3 mg/dL — ABNORMAL LOW (ref 8.9–10.3)
Creatinine, Ser: 3.23 mg/dL — ABNORMAL HIGH (ref 0.61–1.24)
GFR calc Af Amer: 22 mL/min — ABNORMAL LOW (ref >60)
GFR calc non Af Amer: 19 mL/min — ABNORMAL LOW (ref >60)
GLUCOSE: 106 mg/dL — AB (ref 65–99)
POTASSIUM: 3.4 mmol/L — AB (ref 3.5–5.1)
Sodium: 135 mmol/L (ref 135–145)

## 2018-02-24 LAB — MAGNESIUM: MAGNESIUM: 2.1 mg/dL (ref 1.7–2.4)

## 2018-02-24 LAB — ECHOCARDIOGRAM COMPLETE
Height: 69 in
Weight: 2380.8 oz

## 2018-02-24 MED ORDER — LIDOCAINE HCL (PF) 2 % IJ SOLN
INTRAMUSCULAR | Status: AC
  Filled 2018-02-24: qty 20

## 2018-02-24 MED ORDER — FUROSEMIDE 10 MG/ML IJ SOLN
40.0000 mg | Freq: Once | INTRAMUSCULAR | Status: AC
Start: 2018-02-24 — End: 2018-02-24
  Administered 2018-02-24: 40 mg via INTRAVENOUS
  Filled 2018-02-24: qty 4

## 2018-02-24 MED ORDER — METOLAZONE 5 MG PO TABS
5.0000 mg | ORAL_TABLET | Freq: Once | ORAL | Status: AC
Start: 2018-02-24 — End: 2018-02-24
  Administered 2018-02-24: 5 mg via ORAL
  Filled 2018-02-24: qty 1

## 2018-02-24 MED ORDER — ONDANSETRON HCL 4 MG PO TABS
4.0000 mg | ORAL_TABLET | Freq: Four times a day (QID) | ORAL | Status: DC | PRN
  Administered 2018-03-02: 4 mg via ORAL
  Filled 2018-02-24: qty 1

## 2018-02-24 MED ORDER — BISACODYL 10 MG RE SUPP
10.0000 mg | Freq: Once | RECTAL | Status: AC
  Administered 2018-02-24: 10 mg via RECTAL
  Filled 2018-02-24: qty 1

## 2018-02-24 MED ORDER — POTASSIUM CHLORIDE CRYS ER 20 MEQ PO TBCR
20.0000 meq | EXTENDED_RELEASE_TABLET | Freq: Once | ORAL | Status: AC
  Administered 2018-02-24: 20 meq via ORAL
  Filled 2018-02-24: qty 1

## 2018-02-24 MED ORDER — HEPARIN SODIUM (PORCINE) 5000 UNIT/ML IJ SOLN
5000.0000 [IU] | Freq: Three times a day (TID) | INTRAMUSCULAR | Status: DC
  Administered 2018-02-24 – 2018-03-03 (×18): 5000 [IU] via SUBCUTANEOUS
  Filled 2018-02-24 (×16): qty 1

## 2018-02-24 MED ORDER — DOBUTAMINE IN D5W 4-5 MG/ML-% IV SOLN
5.0000 ug/kg/min | INTRAVENOUS | Status: DC
  Filled 2018-02-24: qty 250

## 2018-02-24 MED ORDER — FUROSEMIDE 10 MG/ML IJ SOLN
120.0000 mg | Freq: Two times a day (BID) | INTRAVENOUS | Status: DC
  Administered 2018-02-24 – 2018-02-25 (×3): 120 mg via INTRAVENOUS
  Filled 2018-02-24 (×3): qty 12
  Filled 2018-02-24: qty 10

## 2018-02-24 MED ORDER — ONDANSETRON HCL 4 MG/2ML IJ SOLN
4.0000 mg | Freq: Four times a day (QID) | INTRAMUSCULAR | Status: DC | PRN
  Administered 2018-03-02: 4 mg via INTRAVENOUS
  Filled 2018-02-24: qty 2

## 2018-02-24 MED ORDER — DOBUTAMINE IN D5W 4-5 MG/ML-% IV SOLN
2.5000 ug/kg/min | INTRAVENOUS | Status: DC
  Administered 2018-02-24 – 2018-02-28 (×3): 2.5 ug/kg/min via INTRAVENOUS
  Filled 2018-02-24 (×2): qty 250

## 2018-02-24 MED ORDER — POTASSIUM CHLORIDE CRYS ER 20 MEQ PO TBCR
20.0000 meq | EXTENDED_RELEASE_TABLET | Freq: Every day | ORAL | Status: DC
  Administered 2018-02-24 – 2018-02-25 (×2): 20 meq via ORAL
  Filled 2018-02-24 (×2): qty 1

## 2018-02-24 MED ORDER — FUROSEMIDE 10 MG/ML IJ SOLN: 80.0000 mg | Freq: Two times a day (BID) | INTRAMUSCULAR | Status: DC

## 2018-02-24 NOTE — Progress Notes (Signed)
Patient known to me through his involvement in the [facility] and [facility] Clinic visits.  He was recently at [facility] and left AMA.  The HF Navigator there contacted me to follow-up with patient and be sure he was scheduled a follow-up appt in AHF clinic.  I attempted to contact him yesterday and could not reach him.  In the meantime he was admitted through [facility] ED.  He will continue with [facility] at the time of discharge.

## 2018-02-24 NOTE — Progress Notes (Signed)
Patient ID: Kelly Leonard, male   DOB: 06-Oct-1955, 62 y.o.   MRN: [id number]   [place] Limited Abd 4 quadrants imaged  Very small ascites noted in RUQ Not large enough to access No other ascites seen per [place]  No paracentesis performed

## 2018-02-24 NOTE — Progress Notes (Signed)
Subjective: Patient admitted this morning, see detailed H&P by Dr Mariea Clonts 62 y.o. male with medical history significant for systolic heart failure with severely reduced EF- ICD status, Gout, CKD4.  Patient presented to the ED today with complaints of shortness of breath, bilateral lower extremity swelling, scrotal and abdominal swelling of at least 1 week duration. Patient was admitted at [facility] 02/18/18-with significant volume overload.  Cardiology and nephrology were consulted.  Patient did not respond to diuresis alone so was transferred to the ICU and placed on dobutamine and Lasix drip.  Yesterday patient decided to leave AMA, as he felt they were not getting the fluid out fast enough.  At [facility]- troponin X 2 negative. Per documentation patient was net 5L since admission at [facility].  Patient seen and examined, admitted with acute on chronic systolic CHF Breathing has improved, denies chest pain. Still has lower extremity edema Had 14 beats runs of Vtach this am Potassium is 3.4  Vitals:   02/24/18 0530 02/24/18 0758  BP: (!) 119/97 115/82  Pulse: (!) 104 100  Resp:  18  Temp:  97.9 F (36.6 C)  SpO2:  100%      A/P  Vtach/ systolic CHF- Replace potassium - give extra dose of kdur 20 meq po x1 Cardiology consultation Continue Lasix Daily BMP  CKD stage IV- creatinine is stable   Meredeth Ide [facility] Pager- [phone]

## 2018-02-24 NOTE — H&P (Signed)
History and Physical    Kelly Leonard ZOX:[id number] DOB: Mar 02, 1956 DOA: 02/23/2018  PCP: Corky Downs, MD   Patient coming from: [facility] (patient left [facility] 02/23/18)  Chief Complaint: SOB.  HPI: Kelly Leonard is a 62 y.o. male with medical history significant for systolic heart failure with severely reduced EF- ICD status, Gout, CKD4.  Patient presented to the ED today with complaints of shortness of breath, bilateral lower extremity swelling, scrotal and abdominal swelling of at least 1 week duration. Patient was admitted at [facility] 02/18/18-with significant volume overload.  Cardiology and nephrology were consulted.  Patient did not respond to diuresis alone so was transferred to the ICU and placed on dobutamine and Lasix drip.  Yesterday patient decided to leave AMA, as he felt they were not getting the fluid out fast enough.  At [place]- troponin X 2 negative. Per documentation patient was net 5L since admission at [facility]. Patient reports prior to admission he was compliant with his Bumex 3mg  twice daily.  Patient at this time denies chest pain, cough or fevers.  ED Course: Heart rate 78-108, blood pressure systolic 108-128.  O2 sats greater than 96% on room air, patient was just on O2 nasal cannula for comfort.  Chest x-ray showed pulmonary vascular congestion, cardiomegaly and small bilateral pleural effusions, bibasilar opacities-atelectasis or edema.  Hemoglobin stable 11.2, WBC 6.4, Cr- 3.3, improved compared to prior over the past 2 months-as high as 5.8.  I-STAT troponin negative.  Patient was given Lasix IV 401 in ED. ED provider unsuccessfully tried contacting cardiology twice.  Hospitalist called to admit for volume overload.  Review of Systems: As per HPI otherwise 10 point review of systems negative.   Past Medical History:  Diagnosis Date  . Anxiety   . Bilateral renal masses    a. 01/2018 - noted on CT w/ rec for MRI to better eval.  .  Chronic systolic congestive heart failure (HCC)    a. 06/2017 Echo: EF 20-25%, diff HK. Mod to sev MR, mod dil LA. Mildly dil RA. PASP .  . CKD (chronic kidney disease) stage 4, GFR 15-29 ml/min (HCC)   . COPD (chronic obstructive pulmonary disease) (HCC)   . Hyperlipidemia   . Hypertension   . MI (myocardial infarction) (HCC)   . Mitral regurgitation    a. 06/2017 Echo: mod - sev MR.  Marland Kitchen NICM (nonischemic cardiomyopathy) (HCC)    a. 11/2005 MV: reversible inf ischemia, EF 32%; b. Notes indicate pt previously cathed - nl cors; c. 06/2015 s/p MDT [id number] Visia AF MRI VR single lead AICD ([facility]); d. 06/2017 Echo: EF 20-25%.  . Panic attacks   . Polycystic kidney    a. w/ CKD IV.    Past Surgical History:  Procedure Laterality Date  . CARDIAC CATHETERIZATION    . IMPLANTABLE CARDIOVERTER DEFIBRILLATOR (ICD) GENERATOR CHANGE Right 06/23/2015   Procedure: ICD GENERATOR  INITIAL IMPLANT;  Surgeon: Sharion Settler, MD;  Location: [facility] ORS;  Service: Cardiovascular;  Laterality: Right;     reports that he has never smoked. He has never used smokeless tobacco. He reports that he does not drink alcohol or use drugs.  Allergies  Allergen Reactions  . Entresto [Sacubitril-Valsartan] Swelling    Swelling of the face  . Penicillins Anaphylaxis and Swelling    Has patient had a PCN reaction causing immediate rash, facial/tongue/throat swelling, SOB or lightheadedness with hypotension: Yes Has patient had a PCN reaction causing severe rash involving mucus membranes or skin  necrosis: No Has patient had a PCN reaction that required hospitalization: No Has patient had a PCN reaction occurring within the last 10 years: No If all of the above answers are "NO", then may proceed with Cephalosporin use.   . Seroquel [Quetiapine] Nausea And Vomiting and Hives    Hives   . Ace Inhibitors Rash  . Corlanor [Ivabradine] Swelling    Facial swelling  . Ambien [Zolpidem Tartrate] Swelling  . Bidil  [Isosorb Dinitrate-Hydralazine] Hives  . Carvedilol Other (See Comments)    "dizziness, light headed, and nausea"  . Hydroxyzine Nausea Only  . Prednisone Other (See Comments)    Causes fluid build up  . Sertraline Diarrhea    itching  . Trazodone And Nefazodone Swelling and Other (See Comments)    "up for days" and lip swelling  . Uloric [Febuxostat] Other (See Comments)    Jittery, jerky, makes him lose his mind  . Benadryl [Diphenhydramine Hcl] Other (See Comments)    "makes me crazy and hyper"  . Gabapentin Swelling    Lip and face swelling  . Lorazepam Rash    Rash and severe anxiety  . Torsemide Hives, Itching, Nausea And Vomiting and Rash    Family History  Problem Relation Age of Onset  . Diabetes Mother   . Hypertension Brother   . Heart disease Maternal Grandmother   . Heart attack Maternal Grandmother     Prior to Admission medications   Medication Sig Start Date End Date Taking? Authorizing Provider  ALPRAZolam Prudy Feeler) 1 MG tablet Take 1 tablet (1 mg total) by mouth 3 (three) times daily as needed for up to 3 doses for sleep. Patient not taking: Reported on 01/21/2018 11/26/17   Darci Current, MD  B Complex Vitamins (B COMPLEX PO) Take 1 tablet by mouth daily.    [provider]  bumetanide (BUMEX) 1 MG tablet Take 3 tablets (3 mg total) by mouth daily. May take additional 3 mg in the PM as needed for swelling/SOB 02/03/18   Bensimhon, Bevelyn Buckles, MD  busPIRone (BUSPAR) 5 MG tablet Take 1 tablet (5 mg total) 3 (three) times daily by mouth. Patient not taking: Reported on 01/21/2018 07/15/17   Graciella Freer, PA-C  chlordiazePOXIDE (LIBRIUM) 10 MG capsule Take 1 capsule (10 mg total) by mouth 4 (four) times daily as needed (agitation). 12/31/17   Sharman Cheek, MD  Cholecalciferol (VITAMIN D3) 1000 units CAPS Take 1,000 Units by mouth daily.    [provider]  colchicine 0.6 MG tablet Take 0.6 mg by mouth 2 (two) times daily as needed.  Gout flare up 01/09/18   [provider]  Glecaprevir-Pibrentasvir (MAVYRET) 100-40 MG TABS Take 3 tablets by mouth daily with breakfast. Patient not taking: Reported on 01/21/2018 08/27/17   Kuppelweiser, Cassie L, RPH-CPP  isosorbide-hydrALAZINE (BIDIL) 20-37.5 MG tablet Take 2 tablets 3 (three) times daily by mouth.    [provider]  KLOR-CON M20 20 MEQ tablet Take 20 mEq by mouth daily. 01/18/18   [provider]  metolazone (ZAROXOLYN) 2.5 MG tablet Take 1 tablet (2.5 mg total) by mouth as directed. For weight of 140 lbs or greater Patient not taking: Reported on 02/03/2018 11/19/17 02/17/18  Bensimhon, Bevelyn Buckles, MD  predniSONE (DELTASONE) 5 MG tablet Take 5 mg by mouth daily. 02/02/18   [provider]  promethazine (PHENERGAN) 25 MG tablet Take 25 mg by mouth every 6 (six) hours as needed for nausea or vomiting.    [provider]  triamcinolone ointment (KENALOG) 0.1 % Apply 1 application topically 2 (two) times daily. 02/06/18   [provider]    Physical Exam: Vitals:   02/23/18 2315 02/23/18 2345 02/24/18 0030 02/24/18 0059  BP: (!) 120/96 108/84 108/88 (!) 124/95  Pulse: (!) 101  98 78  Resp: 10 16 16 16   Temp:    97.7 F (36.5 C)  TempSrc:    Oral  SpO2: 100%  100% 100%  Weight:   67.4 kg (148 lb 8 oz)   Height:   5\' 9"  (1.753 m)     Constitutional: NAD, calm, comfortable Vitals:   02/23/18 2315 02/23/18 2345 02/24/18 0030 02/24/18 0059  BP: (!) 120/96 108/84 108/88 (!) 124/95  Pulse: (!) 101  98 78  Resp: 10 16 16 16   Temp:    97.7 F (36.5 C)  TempSrc:    Oral  SpO2: 100%  100% 100%  Weight:   67.4 kg (148 lb 8 oz)   Height:   5\' 9"  (1.753 m)    Eyes: PERRL, lids and conjunctivae normal ENMT: Mucous membranes are moist. Posterior pharynx clear of any exudate or lesions.  Neck: normal, supple, no masses, no thyromegaly Respiratory: clear to auscultation bilaterally, I do not appreciate wheezing, or crackles.  Normal respiratory effort. No accessory muscle use.  Cardiovascular: Mild intermittent tachycardia regular rate and rhythm, no murmurs / rubs / gallops. No extremity edema. 2+ pedal pulses. No carotid bruits.  Abdomen: Mild diffuse abdominal tenderness from ascites, full, no masses palpated. No hepatosplenomegaly. Bowel sounds positive.  Musculoskeletal: no clubbing / cyanosis. No joint deformity upper and lower extremities. Good ROM, no contractures. Normal muscle tone.  Skin: no rashes, lesions, ulcers. No induration Neurologic: CN 2-12 grossly intact. Sensation intact, DTR normal. Strength 5/5 in all 4.  Psychiatric: Normal judgment and insight. Alert and oriented x 3. Normal mood.   Labs on Admission: I have personally reviewed following labs and imaging studies  CBC: Recent Labs  Lab 02/18/18 1616 02/19/18 0650 02/20/18 0450 02/21/18 0551 02/23/18 1810  WBC 7.0 7.3 7.7 7.2 6.4  NEUTROABS 5.4  --   --   --   --   HGB 12.3* 12.7* 12.7* 11.4* 11.2*  HCT 38.4* 40.3 39.8* 35.8* 38.4*  MCV 72.3* 72.2* 71.5* 71.9* 75.3*  PLT 262 279 314 254 259   Basic Metabolic Panel: Recent Labs  Lab 02/19/18 0650 02/20/18 0450 02/21/18 0551 02/22/18 0442 02/23/18 1810  NA 133* 134* 133* 136 138  K 4.1 4.4 4.0 3.4* 4.0  CL 97* 97* 97* 97* 96*  CO2 22 19* 23 26 31   GLUCOSE 115* 89 119* 103* 109*  BUN 71* 76* 88* 87* 80*  CREATININE 3.38* 3.62* 3.58* 3.32* 3.31*  CALCIUM 8.7* 8.8* 8.3* 8.3* 8.5*  MG  --   --  2.1  --   --   PHOS  --   --  5.4*  --   --    Liver Function Tests: Recent Labs  Lab 02/18/18 1616 02/23/18 1810  AST 57* 34  ALT 28 22  ALKPHOS 104 99  BILITOT 1.2 1.2  PROT 7.4 6.3*  ALBUMIN 3.4* 2.9*   Cardiac Enzymes: Recent Labs  Lab 02/17/18 1738 02/18/18 1616  TROPONINI 0.05* 0.05*   Urine analysis:    Component Value Date/Time   COLORURINE YELLOW (A) 02/18/2018 1616   APPEARANCEUR CLEAR (A) 02/18/2018 1616   LABSPEC 1.008 02/18/2018 1616   PHURINE 6.0  02/18/2018 1616  GLUCOSEU NEGATIVE 02/18/2018 1616   HGBUR NEGATIVE 02/18/2018 1616   BILIRUBINUR NEGATIVE 02/18/2018 1616   KETONESUR NEGATIVE 02/18/2018 1616   PROTEINUR 100 (A) 02/18/2018 1616   NITRITE NEGATIVE 02/18/2018 1616   LEUKOCYTESUR NEGATIVE 02/18/2018 1616    Radiological Exams on Admission: Dg Chest 2 View  Result Date: 02/23/2018 CLINICAL DATA:  62 y/o  M; chest pain and shortness of breath. EXAM: CHEST - 2 VIEW COMPARISON:  02/18/2018 chest radiograph. FINDINGS: Cardiomegaly with single lead AICD. Pulmonary vascular congestion. Small bilateral pleural effusions. Bibasilar opacities. No pneumothorax. Bones are unremarkable. IMPRESSION: Cardiomegaly, pulmonary vascular congestion, and small bilateral pleural effusions. Bibasilar opacities probably represent associated atelectasis or edema. Electronically Signed   By: Mitzi Hansen M.D.   On: 02/23/2018 18:39    EKG: Independently reviewed.  QTC 474.  Sinus rhythm. no significant change from prior  Assessment/Plan Principal Problem:   Acute on chronic systolic CHF (congestive heart failure) (HCC) Active Problems:   Hypertension   CKD (chronic kidney disease) stage 4, GFR 15-29 ml/min (HCC)   ICD (implantable cardioverter-defibrillator) in place   Gout   Acute on chronic systolic heart failure-with anasarca. Weight Today 148 pounds, discharged weight 1 month ago- 01/25/2018- 123lbs per chart, also admitted for CHF.  EKG unchanged.  Chest x-ray shows vascular congestion.  At [place]- troponin x2- remained flat at 0.05-likely troponin leak from cardiomyopathy. IV Lasix 40 given in ED. Reports compliance with Bumex 3 mg twice daily.  Last echo 06/2017 - Ef 20- 25%.  ICD status. -Additional 40mg  Lasix x1 now, then 80  IV Lasix twice daily -Consulted cardiology, still awaiting call back, re-consult cardiology in a.m-advanced heart failure team -Daily weights -Strict Input outputs -Trops x1 -BMP a.m. - cont home  Kdur supplimentation  CKD 4- 2/2 polycystic kidney disease.  Creatinine 3.3, improved compared with the past 2 months, elevated up to 55.8.  01/2018 admission here at [facility]- patient refused vascular access placement for dialysis planning. -Will need nephrology reevaluation at some point -Diuresis  HTN-blood pressure soft- stabl;e.  Systolic 108- 128.  -Hold home Imdur for now while diuresing.    DVT prophylaxis: Heparin Code Status: full Family Communication: None at bedside Disposition Plan: Per rounding team Consults called: Cardiology consult a.m Admission status: Inpt, tele   Onnie Boer MD [facility] Pager [phone] From 6PM-2AM.  Otherwise please contact night-coverage [email or web address] Password [email or web address]  02/24/2018, 1:09 AM

## 2018-02-24 NOTE — Progress Notes (Signed)
CCMD called RN that patient had 14 beats of Vtach, on assessment patient was asymptomatic, MD [place] made aware via amion.

## 2018-02-24 NOTE — ED Notes (Signed)
Admitting MD at bedside.

## 2018-02-24 NOTE — Consult Note (Signed)
Kelly Leonard  HISTORY AND PHYSICAL  Kelly Leonard is an 62 y.o. male.    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 Aundra Dubin for eval and recs re: advanced CKD in the setting of volume overload.  Last Cr was 3.23 this AM which appears to be near his baseline.    Pt was recently admitted to [facility] for vol overload.  Was initially diuresed with IV Lasix and was feeling some better; however he left [facility] 6/17 as the fluid "wasn't coming off fast enough".  He presented to the [facility] ED yesterday with worsening symptoms of vol overload.  Has been admitted and seen by advanced HF.  He was started empirically on dobutamine with high-dose Lasix and metolazone.    Currently he reports SOB and severe swelling in legs and genitals.  He has been told multiple times by inpt and outpt providers that he needs appropriate dialysis planning which he has declined.  I told him that we would more than likely need to start dialysis this admission due to massive vol overload; he requested I call his wife.    PMH: Past Medical History:  Diagnosis Date  . Anxiety   . Bilateral renal masses    a. 01/2018 - noted on CT w/ rec for MRI to better eval.  . Chronic systolic congestive heart failure ([place])    a. 06/2017 Echo: EF 20-25%, diff HK. Mod to sev MR, mod dil LA. Mildly dil RA. PASP 16mHg.  . CKD (chronic kidney disease) stage 4, GFR 15-29 ml/min (HCC)   . COPD (chronic obstructive pulmonary disease) ([place]   . Hyperlipidemia   . Hypertension   . MI (myocardial infarction) ([place]   . Mitral regurgitation    a. 06/2017 Echo: mod - sev MR.  .Marland KitchenNICM (nonischemic cardiomyopathy) ([place]    a. 11/2005 MV: reversible inf ischemia, EF 32%; b. Notes indicate pt previously cathed - nl cors; c. 06/2015 s/p MDT  [id number] AF MRI VR single lead AICD ([facility]); d. 06/2017 Echo: EF 20-25%.  . Panic attacks   . Polycystic kidney    a. w/ CKD IV.   PSH: Past Surgical History:  Procedure Laterality Date  . CARDIAC CATHETERIZATION    . IMPLANTABLE CARDIOVERTER DEFIBRILLATOR (ICD) GENERATOR CHANGE Right 06/23/2015   Procedure: ICD GENERATOR  INITIAL IMPLANT;  Surgeon: KMarzetta Board MD;  Location: [facility] ORS;  Service: Cardiovascular;  Laterality: Right;    Past Medical History:  Diagnosis Date  . Anxiety   . Bilateral renal masses    a. 01/2018 - noted on CT w/ rec for MRI to better eval.  . Chronic systolic congestive heart failure ([place]    a. 06/2017 Echo: EF 20-25%, diff HK. Mod to sev MR, mod dil LA. Mildly dil RA. PASP 532mg.  . CKD (chronic kidney disease) stage 4, GFR 15-29 ml/min (HCC)   . COPD (chronic obstructive pulmonary disease) ([place]  . Hyperlipidemia   . Hypertension   . MI (myocardial infarction) ([place]  . Mitral regurgitation    a. 06/2017 Echo: mod - sev MR.  . Marland KitchenICM (nonischemic cardiomyopathy) ([facility]   a. 11/2005 MV: reversible inf ischemia, EF 32%; b. Notes indicate pt previously cathed - nl cors; c. 06/2015 s/p MDT  [id number] Visia AF MRI VR single lead AICD ([facility]); d. 06/2017 Echo: EF 20-25%.  . Panic attacks   . Polycystic kidney    a. w/ CKD IV.    Medications:  Reviewed in Epic  Medications Prior to Admission  Medication Sig Dispense Refill  . ALPRAZolam (XANAX) 1 MG tablet Take 1 tablet (1 mg total) by mouth 3 (three) times daily as needed for up to 3 doses for sleep. (Patient not taking: Reported on 01/21/2018) 3 tablet 0  . B Complex Vitamins (B COMPLEX PO) Take 1 tablet by mouth daily.    . bumetanide (BUMEX) 1 MG tablet Take 3 tablets (3 mg total) by mouth daily. May take additional 3 mg in the PM as needed for swelling/SOB 90 tablet 3  . busPIRone (BUSPAR) 5 MG tablet Take 1 tablet (5 mg total) 3 (three) times daily by mouth. (Patient not taking: Reported on  01/21/2018) 90 tablet 3  . chlordiazePOXIDE (LIBRIUM) 10 MG capsule Take 1 capsule (10 mg total) by mouth 4 (four) times daily as needed (agitation). 8 capsule 0  . Cholecalciferol (VITAMIN D3) 1000 units CAPS Take 1,000 Units by mouth daily.    . colchicine 0.6 MG tablet Take 0.6 mg by mouth 2 (two) times daily as needed. Gout flare up  2  . Glecaprevir-Pibrentasvir (MAVYRET) 100-40 MG TABS Take 3 tablets by mouth daily with breakfast. (Patient not taking: Reported on 01/21/2018) 84 tablet 2  . isosorbide-hydrALAZINE (BIDIL) 20-37.5 MG tablet Take 2 tablets 3 (three) times daily by mouth.    Marland Kitchen KLOR-CON M20 20 MEQ tablet Take 20 mEq by mouth daily.  6  . metolazone (ZAROXOLYN) 2.5 MG tablet Take 1 tablet (2.5 mg total) by mouth as directed. For weight of 140 lbs or greater (Patient not taking: Reported on 02/03/2018) 5 tablet 0  . predniSONE (DELTASONE) 5 MG tablet Take 5 mg by mouth daily.  2  . promethazine (PHENERGAN) 25 MG tablet Take 25 mg by mouth every 6 (six) hours as needed for nausea or vomiting.    . triamcinolone ointment (KENALOG) 0.1 % Apply 1 application topically 2 (two) times daily.  2    ALLERGIES:   Allergies  Allergen Reactions  . Entresto [Sacubitril-Valsartan] Swelling    Swelling of the face  . Penicillins Anaphylaxis and Swelling    Has patient had a PCN reaction causing immediate rash, facial/tongue/throat swelling, SOB or lightheadedness with hypotension: Yes Has patient had a PCN reaction causing severe rash involving mucus membranes or skin necrosis: No Has patient had a PCN reaction that required hospitalization: No Has patient had a PCN reaction occurring within the last 10 years: No If all of the above answers are "NO", then may proceed with Cephalosporin use.   . Seroquel [Quetiapine] Nausea And Vomiting and Hives    Hives   . Ace Inhibitors Rash  . Corlanor [Ivabradine] Swelling    Facial swelling  . Ambien [Zolpidem Tartrate] Swelling  . Bidil [Isosorb  Dinitrate-Hydralazine] Hives  . Carvedilol Other (See Comments)    "dizziness, light headed, and nausea"  . Hydroxyzine Nausea Only  . Prednisone Other (See Comments)    Causes fluid build up  . Sertraline Diarrhea    itching  . Trazodone And Nefazodone Swelling and Other (See Comments)    "up for days" and lip swelling  . Uloric [Febuxostat] Other (See Comments)    Jittery, jerky, makes him lose his mind  . Benadryl [Diphenhydramine Hcl] Other (See Comments)    "  makes me crazy and hyper"  . Gabapentin Swelling    Lip and face swelling  . Lorazepam Rash    Rash and severe anxiety  . Torsemide Hives, Itching, Nausea And Vomiting and Rash    FAM HX: Family History  Problem Relation Age of Onset  . Diabetes Mother   . Hypertension Brother   . Heart disease Maternal Grandmother   . Heart attack Maternal Grandmother     Social History:   reports that he has never smoked. He has never used smokeless tobacco. He reports that he does not drink alcohol or use drugs.  ROS: ROS: all other systems reviewed and are negative except as per HPI  Blood pressure (!) 133/107, pulse (!) 102, temperature 98.6 F (37 C), temperature source Oral, resp. rate (!) 28, height _0  (1.753 m), weight 67.5 kg (148 lb 12.8 oz), SpO2 94 %. PHYSICAL EXAM: Physical Exam  GEN appears uncomfortable and SOB HEENT sclerae anicteric NECK +JVD to angle of mandible PULM bilateral crackles, wearing O2, increased WOB CV tachycardic, II/VI systolic murmur with +S3 ABD distended and diffusely mildly tender to palpation EXT 3+ pitting edema up the thighs  NEURO AAO x 3 SKIN warm and dry, no rashes or lesions   Results for orders placed or performed during the hospital encounter of 02/23/18 (from the past 48 hour(s))  CBC     Status: Abnormal   Collection Time: 02/23/18  6:10 PM  Result Value Ref Range   WBC 6.4 4.0 - 10.5 K/uL   RBC 5.10 4.22 - 5.81 MIL/uL   Hemoglobin 11.2 (L) 13.0 - 17.0 g/dL   HCT  38.4 (L) 39.0 - 52.0 %   MCV 75.3 (L) 78.0 - 100.0 fL   MCH 22.0 (L) 26.0 - 34.0 pg   MCHC 29.2 (L) 30.0 - 36.0 g/dL   RDW 27.6 (H) 11.5 - 15.5 %   Platelets 259 150 - 400 K/uL    Comment: Performed at [facility], [address] [place], [place] [address]  Comprehensive metabolic panel     Status: Abnormal   Collection Time: 02/23/18  6:10 PM  Result Value Ref Range   Sodium 138 135 - 145 mmol/L   Potassium 4.0 3.5 - 5.1 mmol/L   Chloride 96 (L) 101 - 111 mmol/L   CO2 31 22 - 32 mmol/L   Glucose, Bld 109 (H) 65 - 99 mg/dL   BUN 80 (H) 6 - 20 mg/dL   Creatinine, Ser 3.31 (H) 0.61 - 1.24 mg/dL   Calcium 8.5 (L) 8.9 - 10.3 mg/dL   Total Protein 6.3 (L) 6.5 - 8.1 g/dL   Albumin 2.9 (L) 3.5 - 5.0 g/dL   AST 34 15 - 41 U/L   ALT 22 17 - 63 U/L   Alkaline Phosphatase 99 38 - 126 U/L   Total Bilirubin 1.2 0.3 - 1.2 mg/dL   GFR calc non Af Amer 19 (L) >60 mL/min   GFR calc Af Amer 22 (L) >60 mL/min    Comment: (NOTE) The eGFR has been calculated using the CKD EPI equation. This calculation has not been validated in all clinical situations. eGFR's persistently <60 mL/min signify possible Chronic Kidney Disease.    Anion gap 11 5 - 15    Comment: Performed at [facility] [address] [place], [place] [address]  Brain natriuretic peptide     Status: Abnormal   Collection Time: 02/23/18  6:10 PM  Result Value Ref  Range   B Natriuretic Peptide >4,500.0 (H) 0.0 - 100.0 pg/mL    Comment: Performed at [facility] [address], [place], [place] [address]  I-stat troponin, ED     Status: None   Collection Time: 02/23/18  6:15 PM  Result Value Ref Range   Troponin i, poc 0.04 0.00 - 0.08 ng/mL   Comment 3            Comment: Due to the release kinetics of cTnI, a negative result within the first hours of the onset of symptoms does not rule out myocardial infarction with certainty. If myocardial infarction is still suspected, repeat the test at appropriate  intervals.   Basic metabolic panel     Status: Abnormal   Collection Time: 02/24/18  4:26 AM  Result Value Ref Range   Sodium 135 135 - 145 mmol/L   Potassium 3.4 (L) 3.5 - 5.1 mmol/L   Chloride 97 (L) 101 - 111 mmol/L   CO2 26 22 - 32 mmol/L   Glucose, Bld 106 (H) 65 - 99 mg/dL   BUN 80 (H) 6 - 20 mg/dL   Creatinine, Ser 3.23 (H) 0.61 - 1.24 mg/dL   Calcium 8.3 (L) 8.9 - 10.3 mg/dL   GFR calc non Af Amer 19 (L) >60 mL/min   GFR calc Af Amer 22 (L) >60 mL/min    Comment: (NOTE) The eGFR has been calculated using the CKD EPI equation. This calculation has not been validated in all clinical situations. eGFR's persistently <60 mL/min signify possible Chronic Kidney Disease.    Anion gap 12 5 - 15    Comment: Performed at [facility] [address] [place], [place] [address]  Magnesium     Status: None   Collection Time: 02/24/18  4:26 AM  Result Value Ref Range   Magnesium 2.1 1.7 - 2.4 mg/dL    Comment: Performed at [facility] [address] [place], [place] [address]    Dg Chest 2 View  Result Date: 02/23/2018 CLINICAL DATA:  62 y/o  M; chest pain and shortness of breath. EXAM: CHEST - 2 VIEW COMPARISON:  02/18/2018 chest radiograph. FINDINGS: Cardiomegaly with single lead AICD. Pulmonary vascular congestion. Small bilateral pleural effusions. Bibasilar opacities. No pneumothorax. Bones are unremarkable. IMPRESSION: Cardiomegaly, pulmonary vascular congestion, and small bilateral pleural effusions. Bibasilar opacities probably represent associated atelectasis or edema. Electronically Signed   By: Kristine Garbe M.D.   On: 02/23/2018 18:39   Ir Abdomen US Limited  Result Date: 02/24/2018 CLINICAL DATA:  Ascites, assess for paracentesis EXAM: LIMITED ABDOMEN ULTRASOUND FOR ASCITES TECHNIQUE: Limited ultrasound survey for ascites was performed in all four abdominal quadrants. COMPARISON:  01/22/2018 CT without contrast FINDINGS: Survey of the abdominal 4  quadrants demonstrates a small amount of abdominal ascites but not enough to warrant therapeutic paracentesis. Therefore the procedure not performed. IMPRESSION: Small amount of abdominal ascites. Electronically Signed   By: Jerilynn Mages.  Shick M.D.   On: 02/24/2018 13:51    Assessment/Plan  1.  Acute systolic heart failure: he is clearly decompensated.  Has been started empirically on dobut, Lasix, metolazone.  Will defer management of diuretics to advanced heart failure but I agree with their assessment- ultimately will likely require dialysis.  If no improvement in urine output with the above measures, I would favor transfer to the ICU for initiation of CRRT for massive volume overload.  If pt not agreeable to dialysis, will need pall care.  2.  CKD IV-V: progressive.  Will likely require dialysis as noted above.  I have discussed this frankly with the pt.  Will call wife to discuss as well.  If agreeable will proceed with the above plan.    3.  HCV- per primary  4.  Gout- per primary.  Madelon Lips 02/24/2018, 2:38 PM

## 2018-02-24 NOTE — Consult Note (Addendum)
Advanced Heart Failure Team Consult Note   Primary Physician: Corky Downs, MD PCP-Cardiologist:  Arvilla Meres, MD  Reason for Consultation: Acute on chronic systolic CHF  HPI:    Kelly Leonard is seen today for evaluation of A/C systolic CHF at the request of Dr. Sharl Ma.   Kelly Leonard is a 62 y.o. 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   Last seen in CHF clinic 01/21/18. Was lightheaded at that time with several days of N/V and diarrhea.  Labs with significant AKI so was sent to ED. Admitted to 01/25/18.      Pt seen in ED 5/28 and 02/17/18. Pt admitted to [facility] 02/18/18 to 02/23/18. Then left AMA on 02/23/18 due the fluid "not coming off fast enough".   Seen by renal during admission in May, and pt refused HD planning.   At [facility] was responding poorly to IV lasix alone, so was moved to ICU for dobutamine and Lasix gtt. Pt thought it was taking too long so left AMA 02/23/18 and came to [facility] this am.   He has had > 1 week of SOB, BLE edema, along with scrotal and abdominal swelling.  Pertinent labs on admission include K 3.4, Cr 3.23, BNP > 4500, Hgb 11.2. WNC 6.4. CXR with pulmonary congestion and small bilateral pleural effusions.   Feeling very tired and swollen. UOP seems to have dropped off. He is now SOB with any activity including ADLs. He says his fluid builds back up within a few days of being out of the hospital.  Seen by Nephrology 02/22/18 and HD planning recommended.  Per note, Pt and wife asked for second opinion.   Echo 06/2017 LVEF 25%  Review of systems complete and found to be negative unless listed in HPI.    Home Medications Prior to Admission medications   Medication Sig Start Date End Date Taking? Authorizing Provider  ALPRAZolam Prudy Feeler) 1 MG tablet Take 1 tablet (1 mg total) by mouth 3 (three) times daily as needed for up to 3 doses for  sleep. Patient not taking: Reported on 01/21/2018 11/26/17   Darci Current, MD  B Complex Vitamins (B COMPLEX PO) Take 1 tablet by mouth daily.    [provider]  bumetanide (BUMEX) 1 MG tablet Take 3 tablets (3 mg total) by mouth daily. May take additional 3 mg in the PM as needed for swelling/SOB 02/03/18   Bensimhon, Bevelyn Buckles, MD  busPIRone (BUSPAR) 5 MG tablet Take 1 tablet (5 mg total) 3 (three) times daily by mouth. Patient not taking: Reported on 01/21/2018 07/15/17   Graciella Freer, PA-C  chlordiazePOXIDE (LIBRIUM) 10 MG capsule Take 1 capsule (10 mg total) by mouth 4 (four) times daily as needed (agitation). 12/31/17   Sharman Cheek, MD  Cholecalciferol (VITAMIN D3) 1000 units CAPS Take 1,000 Units by mouth daily.    [provider]  colchicine 0.6 MG tablet Take 0.6 mg by mouth 2 (two) times daily as needed. Gout flare up 01/09/18   [provider]  Glecaprevir-Pibrentasvir (MAVYRET) 100-40 MG TABS Take 3 tablets by mouth daily with breakfast. Patient not taking: Reported on 01/21/2018 08/27/17   Kuppelweiser, Cassie L, RPH-CPP  isosorbide-hydrALAZINE (BIDIL) 20-37.5 MG tablet Take 2 tablets 3 (three) times daily by mouth.    [provider]  KLOR-CON M20 20 MEQ tablet Take 20 mEq by mouth daily. 01/18/18  [provider]  metolazone (ZAROXOLYN) 2.5 MG tablet Take 1 tablet (2.5 mg total) by mouth as directed. For weight of 140 lbs or greater Patient not taking: Reported on 02/03/2018 11/19/17 02/17/18  Bensimhon, Bevelyn Buckles, MD  predniSONE (DELTASONE) 5 MG tablet Take 5 mg by mouth daily. 02/02/18   [provider]  promethazine (PHENERGAN) 25 MG tablet Take 25 mg by mouth every 6 (six) hours as needed for nausea or vomiting.    [provider]  triamcinolone ointment (KENALOG) 0.1 % Apply 1 application topically 2 (two) times daily. 02/06/18   [provider]    Past Medical History: Past Medical History:   Diagnosis Date  . Anxiety   . Bilateral renal masses    a. 01/2018 - noted on CT w/ rec for MRI to better eval.  . Chronic systolic congestive heart failure (HCC)    a. 06/2017 Echo: EF 20-25%, diff HK. Mod to sev MR, mod dil LA. Mildly dil RA. PASP .  . CKD (chronic kidney disease) stage 4, GFR 15-29 ml/min (HCC)   . COPD (chronic obstructive pulmonary disease) (HCC)   . Hyperlipidemia   . Hypertension   . MI (myocardial infarction) (HCC)   . Mitral regurgitation    a. 06/2017 Echo: mod - sev MR.  Marland Kitchen NICM (nonischemic cardiomyopathy) (HCC)    a. 11/2005 MV: reversible inf ischemia, EF 32%; b. Notes indicate pt previously cathed - nl cors; c. 06/2015 s/p MDT [id number] Visia AF MRI VR single lead AICD ([facility]); d. 06/2017 Echo: EF 20-25%.  . Panic attacks   . Polycystic kidney    a. w/ CKD IV.    Past Surgical History: Past Surgical History:  Procedure Laterality Date  . CARDIAC CATHETERIZATION    . IMPLANTABLE CARDIOVERTER DEFIBRILLATOR (ICD) GENERATOR CHANGE Right 06/23/2015   Procedure: ICD GENERATOR  INITIAL IMPLANT;  Surgeon: Sharion Settler, MD;  Location: [facility] ORS;  Service: Cardiovascular;  Laterality: Right;    Family History: Family History  Problem Relation Age of Onset  . Diabetes Mother   . Hypertension Brother   . Heart disease Maternal Grandmother   . Heart attack Maternal Grandmother     Social History: Social History   Socioeconomic History  . Marital status: Married    Spouse name: Not on file  . Number of children: Not on file  . Years of education: Not on file  . Highest education level: Not on file  Occupational History  . Not on file  Social Needs  . Financial resource strain: Not on file  . Food insecurity:    Worry: Not on file    Inability: Not on file  . Transportation needs:    Medical: Not on file    Non-medical: Not on file  Tobacco Use  . Smoking status: Never Smoker  . Smokeless tobacco: Never Used  Substance and Sexual Activity   . Alcohol use: No  . Drug use: No  . Sexual activity: Yes  Lifestyle  . Physical activity:    Days per week: Not on file    Minutes per session: Not on file  . Stress: Not on file  Relationships  . Social connections:    Talks on phone: Not on file    Gets together: Not on file    Attends religious service: Not on file    Active member of club or organization: Not on file    Attends meetings of clubs or organizations: Not on file  Relationship status: Not on file  Other Topics Concern  . Not on file  Social History Narrative   Lives locally with wife.    Allergies:  Allergies  Allergen Reactions  . Entresto [Sacubitril-Valsartan] Swelling    Swelling of the face  . Penicillins Anaphylaxis and Swelling    Has patient had a PCN reaction causing immediate rash, facial/tongue/throat swelling, SOB or lightheadedness with hypotension: Yes Has patient had a PCN reaction causing severe rash involving mucus membranes or skin necrosis: No Has patient had a PCN reaction that required hospitalization: No Has patient had a PCN reaction occurring within the last 10 years: No If all of the above answers are "NO", then may proceed with Cephalosporin use.   . Seroquel [Quetiapine] Nausea And Vomiting and Hives    Hives   . Ace Inhibitors Rash  . Corlanor [Ivabradine] Swelling    Facial swelling  . Ambien [Zolpidem Tartrate] Swelling  . Bidil [Isosorb Dinitrate-Hydralazine] Hives  . Carvedilol Other (See Comments)    "dizziness, light headed, and nausea"  . Hydroxyzine Nausea Only  . Prednisone Other (See Comments)    Causes fluid build up  . Sertraline Diarrhea    itching  . Trazodone And Nefazodone Swelling and Other (See Comments)    "up for days" and lip swelling  . Uloric [Febuxostat] Other (See Comments)    Jittery, jerky, makes him lose his mind  . Benadryl [Diphenhydramine Hcl] Other (See Comments)    "makes me crazy and hyper"  . Gabapentin Swelling    Lip and face  swelling  . Lorazepam Rash    Rash and severe anxiety  . Torsemide Hives, Itching, Nausea And Vomiting and Rash    Objective:    Vital Signs:   Temp:  [97.3 F (36.3 C)-98.1 F (36.7 C)] 97.9 F (36.6 C) (06/18 0758) Pulse Rate:  [78-108] 100 (06/18 0758) Resp:  [10-25] 18 (06/18 0758) BP: (108-128)/(82-106) 115/82 (06/18 0758) SpO2:  [96 %-100 %] 100 % (06/18 0758) Weight:  [147 lb (66.7 kg)-148 lb 12.8 oz (67.5 kg)] 148 lb 12.8 oz (67.5 kg) (06/18 0447) Last BM Date: 02/24/18  Weight change: Filed Weights   02/23/18 1757 02/24/18 0030 02/24/18 0447  Weight: 147 lb (66.7 kg) 148 lb 8 oz (67.4 kg) 148 lb 12.8 oz (67.5 kg)    Intake/Output:   Intake/Output Summary (Last 24 hours) at 02/24/2018 1004 Last data filed at 02/24/2018 0930 Gross per 24 hour  Intake 820 ml  Output 450 ml  Net 370 ml      Physical Exam    General:  Chronically ill appearing. Fatigued. + Cachexia HEENT: Eyes are puffy. + mild scleral icterus.  Neck: supple. JVP ~12 cm. Carotids 2+ bilat; no bruits. No lymphadenopathy or thyromegaly appreciated. Cor: PMI nondisplaced. Regular rate & rhythm. No rubs, gallops or murmurs. Lungs: clear Abdomen: soft, ++ distention, mild tenderness. No hepatosplenomegaly. No bruits or masses. Good bowel sounds. Extremities: no cyanosis, clubbing, or rash. 2-3+ BLE edema.  Neuro: alert & orientedx3, cranial nerves grossly intact. moves all 4 extremities w/o difficulty. Affect pleasant  Telemetry   NSR / ST 90-100s, personally reviewed  EKG    NSR 97 bpm, personally reviewed.   Labs   Basic Metabolic Panel: Recent Labs  Lab 02/20/18 0450 02/21/18 0551 02/22/18 0442 02/23/18 1810 02/24/18 0426  NA 134* 133* 136 138 135  K 4.4 4.0 3.4* 4.0 3.4*  CL 97* 97* 97* 96* 97*  CO2 19* 23 26 31  26  GLUCOSE 89 119* 103* 109* 106*  BUN 76* 88* 87* 80* 80*  CREATININE 3.62* 3.58* 3.32* 3.31* 3.23*  CALCIUM 8.8* 8.3* 8.3* 8.5* 8.3*  MG  --  2.1  --   --   --    PHOS  --  5.4*  --   --   --     Liver Function Tests: Recent Labs  Lab 02/18/18 1616 02/23/18 1810  AST 57* 34  ALT 28 22  ALKPHOS 104 99  BILITOT 1.2 1.2  PROT 7.4 6.3*  ALBUMIN 3.4* 2.9*   No results for input(s): LIPASE, AMYLASE in the last 168 hours. No results for input(s): AMMONIA in the last 168 hours.  CBC: Recent Labs  Lab 02/18/18 1616 02/19/18 0650 02/20/18 0450 02/21/18 0551 02/23/18 1810  WBC 7.0 7.3 7.7 7.2 6.4  NEUTROABS 5.4  --   --   --   --   HGB 12.3* 12.7* 12.7* 11.4* 11.2*  HCT 38.4* 40.3 39.8* 35.8* 38.4*  MCV 72.3* 72.2* 71.5* 71.9* 75.3*  PLT 262 279 314 254 259    Cardiac Enzymes: Recent Labs  Lab 02/17/18 1738 02/18/18 1616  TROPONINI 0.05* 0.05*   BNP: BNP (last 3 results) Recent Labs    02/18/18 1616 02/22/18 0442 02/23/18 1810  BNP 3,332.0* 3,404.0* >4,500.0*    ProBNP (last 3 results) No results for input(s): PROBNP in the last 8760 hours.  CBG: No results for input(s): GLUCAP in the last 168 hours.  Coagulation Studies: No results for input(s): LABPROT, INR in the last 72 hours.  Imaging   Dg Chest 2 View  Result Date: 02/23/2018 CLINICAL DATA:  62 y/o  M; chest pain and shortness of breath. EXAM: CHEST - 2 VIEW COMPARISON:  02/18/2018 chest radiograph. FINDINGS: Cardiomegaly with single lead AICD. Pulmonary vascular congestion. Small bilateral pleural effusions. Bibasilar opacities. No pneumothorax. Bones are unremarkable. IMPRESSION: Cardiomegaly, pulmonary vascular congestion, and small bilateral pleural effusions. Bibasilar opacities probably represent associated atelectasis or edema. Electronically Signed   By: Mitzi Hansen M.D.   On: 02/23/2018 18:39     Medications:    Current Medications: . furosemide  80 mg Intravenous Q12H  . heparin  5,000 Units Subcutaneous Q8H  . potassium chloride SA  20 mEq Oral Daily     Infusions:   Patient Profile   Page Kelly Leonard is a 62 y.o. 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   Admitted 02/24/18 with A/C systolic CHF.   Assessment/Plan   1. A/C Systolic HF, NICM.  - Echo 06/2017 EF 25% - Volume status markedly elevated - Increase lasix to 120 mg BID. (Last dose 40 mg at 0200). If poor response will need dobutamine.  - No bb with previous low output.  - Allergicto entresto, carvedilol, torsemide, and ACE - Bidil on hold. Leave room for diuresis and renal perfusion.   2. AKI on CKD Stage IV - has severe Polycystic Kidney Disease - Baseline Cr thought to be 3.6-3.8.BUN 80 today.  - Follow daily with diuresis.  - Will need nephrology to see. He has been getting closer and closer to HD, but has repeatedly put off and turned down advice for HD planning such as graft/fistula placement.   3. HCV - He has been followed by ID and has been onmavyret per ID - No change to current plan.    4. Gout  - Allergic  to allopurinol,prednisone, and uloric. - Per primary.   5. Hypokalemia - K 3.4. Supp gently with CKD IV.   6. Prognosis - Will likely need Nephrology to see this admit to continue HD discussions. If pt refuses, or not a candidate, he has few  options left. Would likely benefit from palliative care consult, but pt very hesitant to "talk about dying" currently. Will continue to visit.   Medication concerns reviewed with patient and pharmacy team. Barriers identified: Compliance, Anxiety.  Length of Stay: 0  Eli, Pattillo  02/24/2018, 10:04 AM  Advanced Heart Failure Team Pager [phone] (M-F; 7a - 4p)  Please contact [facility] for night-coverage after hours (4p -7a ) and weekends on [email or web address]  Patient seen with PA, agree with the above note.   Patient was recently at [facility] for CHF exacerbation but left AMA, came to ER here 1 day later and now admitted. He is very short of breath, sitting  upright.  Was on dobutamine gtt at [facility].  Creatinine 3.23 today.  He has been conflicted about HD, refused any planning when at [facility].    On exam, he is markedly volume overloaded with JVP 16+ cm, regular rhythm mildly tachy, +S3, 2/6 HSM LLSB/apex, 2+ edema to knees, moderately distended abdomen.   1. Acute on chronic systolic CHF: Echo from 10/18 with EF 25%. Nonischemic cardiomyopathy. He is markedly volume overloaded.  Volume management if very difficult with CKD stage IV nearing ESRD.  He just left AMA from [facility] where he was on dobutamine gtt + Lasix. Very short of breath, sitting up in bed, needs diuresis.  - I will empirically start dobutamine 2.5 mcg/kg/min as I suspect low output HF.  With high creatinine, no PICC.  - Lasix 120 mg IV every 8 hrs for now.  - Bidil on hold for the time being.  With renal failure, not candidate for ACEI/ARB/ARNI/spironolactone/digoxin.  - metolazone 5 mg x 1 now.  - Will repeat echo.  - Will check abdominal US for ascites, can do paracentesis if significant fluid present (distended/mildly uncomfortable abdomen).  - Ultimately, I think he needs HD for volume management.  He seems open to this but wants his wife in on the discussion also.  I will consult renal.  2. CKD stage IV: Nearing ESRD.  In setting of APKD.  He has intractable volume overload.  As above, will increase diuretics.  - Needs nephrology evaluation, planning for HD.  Will ask nephrology to see.  I told him that he would likely need dialysis soon.   Marca Ancona 02/24/2018 12:20 PM

## 2018-02-24 NOTE — Progress Notes (Signed)
  Echocardiogram 2D Echocardiogram has been performed.  Delcie Roch 02/24/2018, 5:06 PM

## 2018-02-25 ENCOUNTER — Inpatient Hospital Stay (HOSPITAL_COMMUNITY): Payer: BLUE CROSS/BLUE SHIELD

## 2018-02-25 ENCOUNTER — Telehealth: Payer: Self-pay

## 2018-02-25 DIAGNOSIS — R0602 Shortness of breath: Secondary | ICD-10-CM

## 2018-02-25 DIAGNOSIS — I10 Essential (primary) hypertension: Secondary | ICD-10-CM

## 2018-02-25 DIAGNOSIS — F419 Anxiety disorder, unspecified: Secondary | ICD-10-CM

## 2018-02-25 DIAGNOSIS — N184 Chronic kidney disease, stage 4 (severe): Secondary | ICD-10-CM

## 2018-02-25 DIAGNOSIS — I5023 Acute on chronic systolic (congestive) heart failure: Secondary | ICD-10-CM

## 2018-02-25 DIAGNOSIS — B182 Chronic viral hepatitis C: Secondary | ICD-10-CM

## 2018-02-25 DIAGNOSIS — R188 Other ascites: Secondary | ICD-10-CM

## 2018-02-25 LAB — RENAL FUNCTION PANEL
ALBUMIN: 3.1 g/dL — AB (ref 3.5–5.0)
Anion gap: 15 (ref 5–15)
BUN: 80 mg/dL — ABNORMAL HIGH (ref 6–20)
CO2: 25 mmol/L (ref 22–32)
Calcium: 8.7 mg/dL — ABNORMAL LOW (ref 8.9–10.3)
Chloride: 98 mmol/L — ABNORMAL LOW (ref 101–111)
Creatinine, Ser: 3.09 mg/dL — ABNORMAL HIGH (ref 0.61–1.24)
GFR calc Af Amer: 23 mL/min — ABNORMAL LOW (ref >60)
GFR, EST NON AFRICAN AMERICAN: 20 mL/min — AB (ref >60)
Glucose, Bld: 119 mg/dL — ABNORMAL HIGH (ref 65–99)
PHOSPHORUS: 3.7 mg/dL (ref 2.5–4.6)
POTASSIUM: 3.9 mmol/L (ref 3.5–5.1)
Sodium: 138 mmol/L (ref 135–145)

## 2018-02-25 LAB — BASIC METABOLIC PANEL
Anion gap: 11 (ref 5–15)
BUN: 88 mg/dL — ABNORMAL HIGH (ref 6–20)
CHLORIDE: 98 mmol/L — AB (ref 101–111)
CO2: 29 mmol/L (ref 22–32)
Calcium: 8.9 mg/dL (ref 8.9–10.3)
Creatinine, Ser: 3.42 mg/dL — ABNORMAL HIGH (ref 0.61–1.24)
GFR, EST AFRICAN AMERICAN: 21 mL/min — AB (ref >60)
GFR, EST NON AFRICAN AMERICAN: 18 mL/min — AB (ref >60)
Glucose, Bld: 102 mg/dL — ABNORMAL HIGH (ref 65–99)
Potassium: 4.6 mmol/L (ref 3.5–5.1)
SODIUM: 138 mmol/L (ref 135–145)

## 2018-02-25 LAB — CBC
HEMATOCRIT: 38.9 % — AB (ref 39.0–52.0)
HEMOGLOBIN: 11.5 g/dL — AB (ref 13.0–17.0)
MCH: 22.2 pg — ABNORMAL LOW (ref 26.0–34.0)
MCHC: 29.6 g/dL — ABNORMAL LOW (ref 30.0–36.0)
MCV: 75 fL — ABNORMAL LOW (ref 78.0–100.0)
Platelets: 260 10*3/uL (ref 150–400)
RBC: 5.19 MIL/uL (ref 4.22–5.81)
RDW: 27.8 % — ABNORMAL HIGH (ref 11.5–15.5)
WBC: 6.1 10*3/uL (ref 4.0–10.5)

## 2018-02-25 LAB — POCT ACTIVATED CLOTTING TIME
ACTIVATED CLOTTING TIME: 109 s
ACTIVATED CLOTTING TIME: 158 s
ACTIVATED CLOTTING TIME: 142 s
Activated Clotting Time: 125 seconds

## 2018-02-25 LAB — MRSA PCR SCREENING: MRSA by PCR: NEGATIVE

## 2018-02-25 MED ORDER — CHLORHEXIDINE GLUCONATE CLOTH 2 % EX PADS
6.0000 | MEDICATED_PAD | Freq: Every day | CUTANEOUS | Status: DC
  Administered 2018-02-25 – 2018-03-03 (×7): 6 via TOPICAL

## 2018-02-25 MED ORDER — SODIUM CHLORIDE 0.9 % IJ SOLN
250.0000 [IU]/h | INTRAMUSCULAR | Status: DC
  Administered 2018-02-25: 350 [IU]/h via INTRAVENOUS_CENTRAL
  Administered 2018-02-25: 250 [IU]/h via INTRAVENOUS_CENTRAL
  Administered 2018-02-26 (×2): 1000 [IU]/h via INTRAVENOUS_CENTRAL
  Administered 2018-02-27: 600 [IU]/h via INTRAVENOUS_CENTRAL
  Administered 2018-02-27: 900 [IU]/h via INTRAVENOUS_CENTRAL
  Administered 2018-02-28 (×2): 1150 [IU]/h via INTRAVENOUS_CENTRAL
  Administered 2018-02-28: 1200 [IU]/h via INTRAVENOUS_CENTRAL
  Administered 2018-03-01: 1100 [IU]/h via INTRAVENOUS_CENTRAL
  Filled 2018-02-25 (×9): qty 2

## 2018-02-25 MED ORDER — OLANZAPINE 2.5 MG PO TABS
2.5000 mg | ORAL_TABLET | Freq: Every day | ORAL | Status: DC
Start: 2018-02-25 — End: 2018-03-03
  Administered 2018-02-25 – 2018-03-01 (×5): 2.5 mg via ORAL
  Filled 2018-02-25 (×6): qty 1

## 2018-02-25 MED ORDER — PRISMASOL BGK 4/2.5 32-4-2.5 MEQ/L IV SOLN
INTRAVENOUS | Status: DC
  Administered 2018-02-25 – 2018-03-01 (×6): via INTRAVENOUS_CENTRAL
  Filled 2018-02-25 (×6): qty 5000

## 2018-02-25 MED ORDER — SODIUM CHLORIDE 0.9% FLUSH: 10.0000 mL | INTRAVENOUS | Status: DC | PRN

## 2018-02-25 MED ORDER — ORAL CARE MOUTH RINSE
15.0000 mL | Freq: Two times a day (BID) | OROMUCOSAL | Status: DC
  Administered 2018-02-25 – 2018-03-03 (×9): 15 mL via OROMUCOSAL

## 2018-02-25 MED ORDER — SODIUM CHLORIDE 0.9 % FOR CRRT
INTRAVENOUS_CENTRAL | Status: DC | PRN
  Filled 2018-02-25: qty 1000

## 2018-02-25 MED ORDER — ALPRAZOLAM 0.5 MG PO TABS
0.5000 mg | ORAL_TABLET | Freq: Three times a day (TID) | ORAL | Status: DC | PRN
  Administered 2018-02-25 – 2018-02-27 (×3): 0.5 mg via ORAL
  Filled 2018-02-25 (×3): qty 1

## 2018-02-25 MED ORDER — HEPARIN SODIUM (PORCINE) 1000 UNIT/ML DIALYSIS
1000.0000 [IU] | INTRAMUSCULAR | Status: DC | PRN
  Administered 2018-03-02: 2500 [IU] via INTRAVENOUS_CENTRAL
  Filled 2018-02-25: qty 1
  Filled 2018-02-25: qty 6
  Filled 2018-02-25: qty 1
  Filled 2018-02-25: qty 6
  Filled 2018-02-25: qty 1

## 2018-02-25 MED ORDER — FUROSEMIDE 10 MG/ML IJ SOLN
120.0000 mg | Freq: Three times a day (TID) | INTRAMUSCULAR | Status: DC
  Administered 2018-02-25 – 2018-02-27 (×6): 120 mg via INTRAVENOUS
  Filled 2018-02-25: qty 12
  Filled 2018-02-25: qty 2
  Filled 2018-02-25: qty 12
  Filled 2018-02-25: qty 10
  Filled 2018-02-25: qty 12
  Filled 2018-02-25: qty 2
  Filled 2018-02-25: qty 12
  Filled 2018-02-25: qty 10

## 2018-02-25 MED ORDER — PRISMASOL BGK 4/2.5 32-4-2.5 MEQ/L IV SOLN
INTRAVENOUS | Status: DC
  Administered 2018-02-25 – 2018-03-02 (×21): via INTRAVENOUS_CENTRAL
  Filled 2018-02-25 (×26): qty 5000

## 2018-02-25 MED ORDER — PRISMASOL BGK 4/2.5 32-4-2.5 MEQ/L IV SOLN
INTRAVENOUS | Status: DC
  Administered 2018-02-25 – 2018-03-02 (×11): via INTRAVENOUS_CENTRAL
  Filled 2018-02-25 (×14): qty 5000

## 2018-02-25 NOTE — Progress Notes (Signed)
eLink Physician-Brief Progress Note Patient Name: Kelly Leonard DOB: 1956-08-20 MRN: [id number]   Date of Service  02/25/2018  HPI/Events of Note  Anxiety, takes alprazolam at home  eICU Interventions  Alprazolam 0.5mg  tid prn anxiety     Intervention Category Minor Interventions: Agitation / anxiety - evaluation and management  Max Fickle 02/25/2018, 8:39 PM

## 2018-02-25 NOTE — Progress Notes (Signed)
Paged nephrology regarding clarifications of orders  Needing transfer orders

## 2018-02-25 NOTE — Progress Notes (Signed)
Paged MD regarding transfer order needed to complete CRRT

## 2018-02-25 NOTE — Progress Notes (Signed)
PROGRESS NOTE    Kelly Leonard  DXI:[id number] DOB: [birth year] DOA: 02/23/2018 PCP: Corky Downs, MD    Brief Narrative:  Patient is a 61 year old male with history of CHF, CKD Stage IV, HTN, COPD, and MI who came into the ED on 6/17 for shortness of breath. He was recently admitted at [facility] for an acute exacerbation of heart failure to the ICU where he left AMA on 6/17. He states he left because "they told me I would be discharged and then changed their mind." He is requiring 3L of O2 and remains tachycardic since his admission. His echocardiogram showed an EF of 20-25%, his creatinine is 3.42 which is around his baseline. He was started on a dobutamine drip with little response. Nephrology recommended initiation of CRRT which started today.    Assessment & Plan:   Principal Problem:   Acute on chronic systolic CHF (congestive heart failure) (HCC) Active Problems:   Hypertension   CKD (chronic kidney disease) stage 4, GFR 15-29 ml/min (HCC)   ICD (implantable cardioverter-defibrillator) in place   Gout   Acute on chronic CHF -Cardiology following, Echocardiogram shows EF of 20-25% -Patient shows marked signs of volume overload, one pound lost over night. -Lasix increased to 120mg  TID, continue dobutamine  -Continue I&Os and daily weights to monitor fluid loss  CKD Stage IV -Nephrology following, moved to ICU and initiated CRRT for fluid removal. -Likely will progress to ESRD needing HD, patient hesitant about long term dialysis -Current BUN 88, creatinine 3.42 which appears to be baseline. Repeat BMP tomorrow   Hypokalemia -Potassium 3.4 yesterday, now up to 4.6 -Monitor with BMP tomorrow   DVT prophylaxis:Heparin  Code Status: Full Code Family Communication: Wife at bed side Disposition Plan: Home when improved   Consultants:   Nephrology  Cardiology  Critical care  Procedures:  Echocardiogram: Left ventricle: The cavity size was mildly dilated. Wall  thickness was normal. Systolic function was severely reduced. The   estimated ejection fraction was in the range of 20% to 25%. Severe diffuse hypokinesis.  CRRT  Antimicrobials:   None   Subjective: Patient reports feeling less short of breath than yesterday but continues to have orthopnea. He reports his leg and abdominal swelling have not gone down even though he feels better.   Objective: Vitals:   02/25/18 0037 02/25/18 0500 02/25/18 1435 02/25/18 1505  BP: (!) 132/96 (!) 126/107 (!) 124/98 (!) 129/110  Pulse: (!) 104 (!) 108 (!) 103 (!) 106  Resp: 20 18 (!) 23 19  Temp: 97.8 F (36.6 C) 97.6 F (36.4 C) 97.9 F (36.6 C)   TempSrc: Oral Oral Oral   SpO2: 100% 100% 100% 100%  Weight:  67 kg (147 lb 12.8 oz)    Height:        Intake/Output Summary (Last 24 hours) at 02/25/2018 1509 Last data filed at 02/25/2018 1400 Gross per 24 hour  Intake 1131.45 ml  Output 2225 ml  Net -1093.55 ml   Filed Weights   02/24/18 0030 02/24/18 0447 02/25/18 0500  Weight: 67.4 kg (148 lb 8 oz) 67.5 kg (148 lb 12.8 oz) 67 kg (147 lb 12.8 oz)    Examination:   General: Appears fatigued  Neurology: Awake and alert, non focal  E ENT: no pallor, no icterus, oral mucosa moist Cardiovascular: +JVD . S1-S2 present, +gallop, + systolic murmur radiating to left axilla. Lower extremities with 3+ pitting edema bilaterally Pulmonary: decreased breath sounds bilaterally, adequate air movement, no wheezing, rhonchi or  rales. Gastrointestinal. Abdomen distended, no organomegaly, non tender, no rebound or guarding Skin. No rashes Musculoskeletal: no joint deformities     Data Reviewed: I have personally reviewed following labs and imaging studies  CBC: Recent Labs  Lab 02/18/18 1616 02/19/18 0650 02/20/18 0450 02/21/18 0551 02/23/18 1810 02/25/18 1345  WBC 7.0 7.3 7.7 7.2 6.4 6.1  NEUTROABS 5.4  --   --   --   --   --   HGB 12.3* 12.7* 12.7* 11.4* 11.2* 11.5*  HCT 38.4* 40.3 39.8*  35.8* 38.4* 38.9*  MCV 72.3* 72.2* 71.5* 71.9* 75.3* 75.0*  PLT 262 279 314 254 259 260   Basic Metabolic Panel: Recent Labs  Lab 02/21/18 0551 02/22/18 0442 02/23/18 1810 02/24/18 0426 02/25/18 0449  NA 133* 136 138 135 138  K 4.0 3.4* 4.0 3.4* 4.6  CL 97* 97* 96* 97* 98*  CO2 23 26 31 26 29   GLUCOSE 119* 103* 109* 106* 102*  BUN 88* 87* 80* 80* 88*  CREATININE 3.58* 3.32* 3.31* 3.23* 3.42*  CALCIUM 8.3* 8.3* 8.5* 8.3* 8.9  MG 2.1  --   --  2.1  --   PHOS 5.4*  --   --   --   --    GFR: Estimated Creatinine Clearance: 21.5 mL/min (A) (by C-G formula based on SCr of 3.42 mg/dL (H)). Liver Function Tests: Recent Labs  Lab 02/18/18 1616 02/23/18 1810  AST 57* 34  ALT 28 22  ALKPHOS 104 99  BILITOT 1.2 1.2  PROT 7.4 6.3*  ALBUMIN 3.4* 2.9*   No results for input(s): LIPASE, AMYLASE in the last 168 hours. No results for input(s): AMMONIA in the last 168 hours. Coagulation Profile: No results for input(s): INR, PROTIME in the last 168 hours. Cardiac Enzymes: Recent Labs  Lab 02/18/18 1616  TROPONINI 0.05*   BNP (last 3 results) No results for input(s): PROBNP in the last 8760 hours. HbA1C: No results for input(s): HGBA1C in the last 72 hours. CBG: No results for input(s): GLUCAP in the last 168 hours. Lipid Profile: No results for input(s): CHOL, HDL, LDLCALC, TRIG, CHOLHDL, LDLDIRECT in the last 72 hours. Thyroid Function Tests: No results for input(s): TSH, T4TOTAL, FREET4, T3FREE, THYROIDAB in the last 72 hours. Anemia Panel: No results for input(s): VITAMINB12, FOLATE, FERRITIN, TIBC, IRON, RETICCTPCT in the last 72 hours.    Radiology Studies: I have reviewed all of the imaging during this hospital visit personally     Scheduled Meds: . heparin  5,000 Units Subcutaneous Q8H   Continuous Infusions: . DOBUTamine 2.5 mcg/kg/min (02/25/18 1400)  . furosemide    . dialysis replacement fluid (prismasate)    . dialysis replacement fluid (prismasate)     . dialysate (PRISMASATE)    . sodium chloride       LOS: 1 day    Cheri Kearns, PA-S    [facility] Pager [phone]

## 2018-02-25 NOTE — Telephone Encounter (Signed)
Pt's wife called today requesting an update on medical records being released for pt to receive long- term disability. Our records show that records have been sent out on 02/18/18. Pt is aware that Medical records have been sent out to the asking party. Pt's will will call us if she is having any issues with receiving long term disability for pt related to medical records from out office. Lorenso Courier, [place]

## 2018-02-25 NOTE — Progress Notes (Signed)
Report called to 2H10, spoke to Emer RN  Pt transferred with RN and NT, pt has all belongings  Pt informed family members

## 2018-02-25 NOTE — Progress Notes (Signed)
PROGRESS NOTE    Kelly Leonard  JAS:[id number] DOB: 06-04-1956 DOA: 02/23/2018 PCP: Kelly Downs, MD      Brief Narrative:  Kelly Leonard is a 62 y.o. M with CHF EF 20-25% s/p ICD, CKD IV baseline 3.5 who presented to OSH on 6/12 with edema.  Cardiology and Nephrology there recommended dobutamine and Lasix drip.  Patient had poor response and left AMA for second opinion at [facility].        Assessment & Plan:  Acute on chronic systolic CHF Baseline dry weight around 125 lbs per patient, currently at 147.  Minimal diuresis overnight with high dose diuretics.  He is significantly fluid overloaded, appears dyspneic at rest.  I agree, he appears to have failed medical diuretic therapy. -Appreciate CHF team recommendations -Consult PCCM for vas-cath placement and ICU transfer for CRRT   Progression of chronic kidney disease, stage IV Polycystic kidney disease Baseline creatinine 3.5, admission here was elevated.  During previous dialysis planning, the patient has refused the prospect of long-term dialysis and vascular access planning. -Appreciate Nephrology recommendations  Hypertension -Hold home BiDil for diuresis  Hepatitis C He completed Mavyret with [facility] this spring.  Anxiety Takes Librium PRN for anxiety.           Code Status: FULL Family Communication: None presetn MDM and disposition Plan: The below labs and imaging reports were reviewed and summarized above.    The patient was admitted with CHF and renal failure.  He is now failed diuretic therapy.  We will consult CCM, place central line for dialysis, which he wishes to pursue.   Consultants:   C ardiology  Nephrology     Subjective: Feels dyspneic.  Feels slightly less orthopnea.  No chest pain.  No confusion.  No fever.  Objective: Vitals:   02/25/18 1505 02/25/18 1530 02/25/18 1600 02/25/18 1700  BP: (!) 129/110 (!) 142/110 (!) 135/112 (!) 151/119  Pulse: (!) 106 (!) 104 (!) 111 (!) 104    Resp: 19 13 19  (!) 4  Temp:      TempSrc:      SpO2: 100% 100% 100% 100%  Weight:      Height:        Intake/Output Summary (Last 24 hours) at 02/25/2018 1706 Last data filed at 02/25/2018 1700 Gross per 24 hour  Intake 898.99 ml  Output 2009 ml  Net -1110.01 ml   Filed Weights   02/24/18 0030 02/24/18 0447 02/25/18 0500  Weight: 67.4 kg (148 lb 8 oz) 67.5 kg (148 lb 12.8 oz) 67 kg (147 lb 12.8 oz)    Examination: General appearance: Thin adult male, alert and in mild respiratory distress.  Lying in bed. HEENT: Anicteric, conjunctiva pink, lids and lashes normal. No nasal deformity, discharge, epistaxis.  Lips moist, edentulous. OP moist, no oral lesions,  Hearing normal.   Skin: Warm and dry.   No suspicious rashes or lesions. Cardiac: Tachycardic, gallop, systolic murmur to axilla.  Capillary refill is brisk.  JVP above angle of jaw.  3+ LE edema.  Radial pulses 2+ and symmetric. Respiratory: Tachypneic at rest.  Rales bilaterally. Abdomen: Abdomen soft.  No TTP. Abdomen has fluid, no fluid wave, real ascites on exam.   Small umbilical hernia.   MSK: No deformities or effusions of the large joints of hte upper of lower extremities. Neuro: Awake and alert.  EOMI, moves all extremities. Speech fluent.    Psych: Sensorium intact and responding to questions, attention normal. Affect blunted.  Judgment and insight  appear normal.    Data Reviewed: I have personally reviewed following labs and imaging studies:  CBC: Recent Labs  Lab 02/19/18 0650 02/20/18 0450 02/21/18 0551 02/23/18 1810 02/25/18 1345  WBC 7.3 7.7 7.2 6.4 6.1  HGB 12.7* 12.7* 11.4* 11.2* 11.5*  HCT 40.3 39.8* 35.8* 38.4* 38.9*  MCV 72.2* 71.5* 71.9* 75.3* 75.0*  PLT 279 314 254 259 260   Basic Metabolic Panel: Recent Labs  Lab 02/21/18 0551 02/22/18 0442 02/23/18 1810 02/24/18 0426 02/25/18 0449  NA 133* 136 138 135 138  K 4.0 3.4* 4.0 3.4* 4.6  CL 97* 97* 96* 97* 98*  CO2 23 26 31 26 29    GLUCOSE 119* 103* 109* 106* 102*  BUN 88* 87* 80* 80* 88*  CREATININE 3.58* 3.32* 3.31* 3.23* 3.42*  CALCIUM 8.3* 8.3* 8.5* 8.3* 8.9  MG 2.1  --   --  2.1  --   PHOS 5.4*  --   --   --   --    GFR: Estimated Creatinine Clearance: 21.5 mL/min (A) (by C-G formula based on SCr of 3.42 mg/dL (H)). Liver Function Tests: Recent Labs  Lab 02/23/18 1810  AST 34  ALT 22  ALKPHOS 99  BILITOT 1.2  PROT 6.3*  ALBUMIN 2.9*   No results for input(s): LIPASE, AMYLASE in the last 168 hours. No results for input(s): AMMONIA in the last 168 hours. Coagulation Profile: No results for input(s): INR, PROTIME in the last 168 hours. Cardiac Enzymes: No results for input(s): CKTOTAL, CKMB, CKMBINDEX, TROPONINI in the last 168 hours. BNP (last 3 results) No results for input(s): PROBNP in the last 8760 hours. HbA1C: No results for input(s): HGBA1C in the last 72 hours. CBG: No results for input(s): GLUCAP in the last 168 hours. Lipid Profile: No results for input(s): CHOL, HDL, LDLCALC, TRIG, CHOLHDL, LDLDIRECT in the last 72 hours. Thyroid Function Tests: No results for input(s): TSH, T4TOTAL, FREET4, T3FREE, THYROIDAB in the last 72 hours. Anemia Panel: No results for input(s): VITAMINB12, FOLATE, FERRITIN, TIBC, IRON, RETICCTPCT in the last 72 hours. Urine analysis:    Component Value Date/Time   COLORURINE YELLOW (A) 02/18/2018 1616   APPEARANCEUR CLEAR (A) 02/18/2018 1616   LABSPEC 1.008 02/18/2018 1616   PHURINE 6.0 02/18/2018 1616   GLUCOSEU NEGATIVE 02/18/2018 1616   HGBUR NEGATIVE 02/18/2018 1616   BILIRUBINUR NEGATIVE 02/18/2018 1616   KETONESUR NEGATIVE 02/18/2018 1616   PROTEINUR 100 (A) 02/18/2018 1616   NITRITE NEGATIVE 02/18/2018 1616   LEUKOCYTESUR NEGATIVE 02/18/2018 1616   Sepsis Labs: @LABRCNTIP (procalcitonin:4,lacticacidven:4)  ) Recent Results (from the past 240 hour(s))  MRSA PCR Screening     Status: None   Collection Time: 02/20/18  4:07 PM  Result Value  Ref Range Status   MRSA by PCR NEGATIVE NEGATIVE Final    Comment:        The GeneXpert MRSA Assay (FDA approved for NASAL specimens only), is one component of a comprehensive MRSA colonization surveillance program. It is not intended to diagnose MRSA infection nor to guide or monitor treatment for MRSA infections. Performed at [facility], [address] [place], [place] [address]   MRSA PCR Screening     Status: None   Collection Time: 02/25/18  2:26 PM  Result Value Ref Range Status   MRSA by PCR NEGATIVE NEGATIVE Final    Comment:        The GeneXpert MRSA Assay (FDA approved for NASAL specimens only), is one component of a comprehensive MRSA  colonization surveillance program. It is not intended to diagnose MRSA infection nor to guide or monitor treatment for MRSA infections. Performed at [facility], [address] [place], [place] [address]          Radiology Studies: Dg Chest 2 View  Result Date: 02/23/2018 CLINICAL DATA:  62 y/o  M; chest pain and shortness of breath. EXAM: CHEST - 2 VIEW COMPARISON:  02/18/2018 chest radiograph. FINDINGS: Cardiomegaly with single lead AICD. Pulmonary vascular congestion. Small bilateral pleural effusions. Bibasilar opacities. No pneumothorax. Bones are unremarkable. IMPRESSION: Cardiomegaly, pulmonary vascular congestion, and small bilateral pleural effusions. Bibasilar opacities probably represent associated atelectasis or edema. Electronically Signed   By: Mitzi Hansen M.D.   On: 02/23/2018 18:39   Dg Chest Port 1 View  Result Date: 02/25/2018 CLINICAL DATA:  Shortness of breath. EXAM: PORTABLE CHEST 1 VIEW COMPARISON:  Radiographs of February 23, 2018. FINDINGS: Stable cardiomegaly. Single lead left-sided pacemaker is unchanged in position. No pneumothorax is noted. Right internal jugular venous sheath is noted. Stable left basilar atelectasis or infiltrate is noted with associated pleural effusion.  Mildly increased right infrahilar atelectasis or infiltrate is noted. Bony thorax is unremarkable. IMPRESSION: Stable left basilar atelectasis or infiltrate is noted with associated pleural effusion. Increased right infrahilar atelectasis or infiltrate is Electronically Signed   By: Lupita Raider, M.D.   On: 02/25/2018 15:38   Ir Abdomen US Limited  Result Date: 02/24/2018 CLINICAL DATA:  Ascites, assess for paracentesis EXAM: LIMITED ABDOMEN ULTRASOUND FOR ASCITES TECHNIQUE: Limited ultrasound survey for ascites was performed in all four abdominal quadrants. COMPARISON:  01/22/2018 CT without contrast FINDINGS: Survey of the abdominal 4 quadrants demonstrates a small amount of abdominal ascites but not enough to warrant therapeutic paracentesis. Therefore the procedure not performed. IMPRESSION: Small amount of abdominal ascites. Electronically Signed   By: Judie Petit.  Shick M.D.   On: 02/24/2018 13:51        Scheduled Meds: . Chlorhexidine Gluconate Cloth  6 each Topical Daily  . heparin  5,000 Units Subcutaneous Q8H  . OLANZapine  2.5 mg Oral QHS   Continuous Infusions: . DOBUTamine 2.5 mcg/kg/min (02/25/18 1700)  . furosemide    . dialysis replacement fluid (prismasate) 500 mL/hr at 02/25/18 1623  . dialysis replacement fluid (prismasate) 200 mL/hr at 02/25/18 1633  . dialysate (PRISMASATE) 1,000 mL/hr at 02/25/18 1622  . sodium chloride       LOS: 1 day    Time spent: 25 minutes    Alberteen Sam, MD [facility] 02/25/2018, 5:06 PM     Pager [phone] --- please page though AMION:  [email or web address] If 7PM-7AM, please contact night-coverage

## 2018-02-25 NOTE — Progress Notes (Addendum)
Advanced Heart Failure Rounding Note  PCP-Cardiologist: Arvilla Meres, MD   Subjective:    Weight down 1 lb on 120 mg IV BID and dobutamine 2.5.   Pt states she feels slightly better today.  Renal saw last night and recommended initiation of CRRT. Pt wished to speak to his wife about it. Wife did not return nephrologys calls.    Wife present in room this am. At the mention of dialysis, any conversation is cut off. She states she has someone "talking to [facility]" as they have "techonologies" that we don't since they are the "best in the region".   Cr 3.23 -> 3.42.  K 3.4 -> 4.6  Objective:   Weight Range: 147 lb 12.8 oz (67 kg) Body mass index is 21.83 kg/m.   Vital Signs:   Temp:  [97.5 F (36.4 C)-98.6 F (37 C)] 97.6 F (36.4 C) (06/19 0500) Pulse Rate:  [99-109] 108 (06/19 0500) Resp:  [18-28] 18 (06/19 0500) BP: (123-133)/(94-107) 126/107 (06/19 0500) SpO2:  [94 %-100 %] 100 % (06/19 0500) Weight:  [147 lb 12.8 oz (67 kg)] 147 lb 12.8 oz (67 kg) (06/19 0500) Last BM Date: 02/24/18  Weight change: Filed Weights   02/24/18 0030 02/24/18 0447 02/25/18 0500  Weight: 148 lb 8 oz (67.4 kg) 148 lb 12.8 oz (67.5 kg) 147 lb 12.8 oz (67 kg)    Intake/Output:   Intake/Output Summary (Last 24 hours) at 02/25/2018 0912 Last data filed at 02/25/2018 0615 Gross per 24 hour  Intake 1248.13 ml  Output 1475 ml  Net -226.87 ml      Physical Exam    General:  Chronically ill and fatigued appearing.  HEENT: Eyes puffy.  Neck: Supple. JVP to jaw. Carotids 2+ bilat; no bruits. No lymphadenopathy or thyromegaly appreciated. Cor: PMI nondisplaced. Regular, fatigued. No rubs, gallops or murmurs. Lungs: Clear Abdomen: Soft, nontender, nondistended. No hepatosplenomegaly. No bruits or masses. Good bowel sounds. Extremities: No cyanosis, clubbing or rash. 2-3+ BLE edema. Neuro: Alert & orientedx3, cranial nerves grossly intact. moves all 4 extremities w/o difficulty. Affect  pleasant  Telemetry   NSR/ Sinus tach 90-100s, personally reviewed.   EKG    No new tracings.    Labs    CBC Recent Labs    02/23/18 1810  WBC 6.4  HGB 11.2*  HCT 38.4*  MCV 75.3*  PLT 259   Basic Metabolic Panel Recent Labs    96/04/54 0426 02/25/18 0449  NA 135 138  K 3.4* 4.6  CL 97* 98*  CO2 26 29  GLUCOSE 106* 102*  BUN 80* 88*  CREATININE 3.23* 3.42*  CALCIUM 8.3* 8.9  MG 2.1  --    Liver Function Tests Recent Labs    02/23/18 1810  AST 34  ALT 22  ALKPHOS 99  BILITOT 1.2  PROT 6.3*  ALBUMIN 2.9*   No results for input(s): LIPASE, AMYLASE in the last 72 hours. Cardiac Enzymes No results for input(s): CKTOTAL, CKMB, CKMBINDEX, TROPONINI in the last 72 hours.  BNP: BNP (last 3 results) Recent Labs    02/18/18 1616 02/22/18 0442 02/23/18 1810  BNP 3,332.0* 3,404.0* >4,500.0*    ProBNP (last 3 results) No results for input(s): PROBNP in the last 8760 hours.   D-Dimer No results for input(s): DDIMER in the last 72 hours. Hemoglobin A1C No results for input(s): HGBA1C in the last 72 hours. Fasting Lipid Panel No results for input(s): CHOL, HDL, LDLCALC, TRIG, CHOLHDL, LDLDIRECT in the last 72  hours. Thyroid Function Tests No results for input(s): TSH, T4TOTAL, T3FREE, THYROIDAB in the last 72 hours.  Invalid input(s): FREET3  Other results:   Imaging    Ir Abdomen US Limited  Result Date: 02/24/2018 CLINICAL DATA:  Ascites, assess for paracentesis EXAM: LIMITED ABDOMEN ULTRASOUND FOR ASCITES TECHNIQUE: Limited ultrasound survey for ascites was performed in all four abdominal quadrants. COMPARISON:  01/22/2018 CT without contrast FINDINGS: Survey of the abdominal 4 quadrants demonstrates a small amount of abdominal ascites but not enough to warrant therapeutic paracentesis. Therefore the procedure not performed. IMPRESSION: Small amount of abdominal ascites. Electronically Signed   By: Judie Petit.  Shick M.D.   On: 02/24/2018 13:51       Medications:     Scheduled Medications: . heparin  5,000 Units Subcutaneous Q8H  . potassium chloride SA  20 mEq Oral Daily     Infusions: . DOBUTamine 2.5 mcg/kg/min (02/25/18 0543)  . furosemide 120 mg (02/25/18 0837)     PRN Medications:  ondansetron **OR** ondansetron (ZOFRAN) IV    Patient Profile   Kelly Leonard is a 62 y.o. 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   Admitted 02/24/18 with A/C systolic CHF.   Assessment/Plan   1. A/C Systolic HF, NICM.  - Echo 06/2017 EF 25% - Volume status remains markedly elevated - Increase lasix to 120 mg TID. (Last dose 40 mg at 0200).  - Continue Dobutamine 2.5 mcg/kg/min.  -No bb with previous low output.  -Allergicto entresto, carvedilol, torsemide, and ACE -Bidil on hold. Leave room for diuresis and renal perfusion.   2. AKI on CKD Stage IV -has severePolycystic Kidney Disease - Baseline Cr thought to be 3.6-3.8. - BUN 88 today.  - Nephrology has seen. Recommends initiation of CRRT. Pt and wife adamantly refuse and stop the conversation when dialysis is mentioned. Will continue to approach carefully. He has very little options left to him.   3. HCV -He has been followed by ID and has been onmavyret per ID - No change to current plan.    4. Gout -Allergic to allopurinol,prednisone, and uloric. - Per primary.   5. Hypokalemia - K 3.4 -> 4.6. No supp today.   6. Prognosis - Nephrology has seen and recommended CRRT.  Pt and wife have decline not only HD consideration, but conversation. They have very poor insight into his disease despite multiple conversation about the need for dialysis and the progression of his disease.  Has now had recommendation of HD during multiple admissions and from multiple cardiologists and nephrologists.  Unfortunately, suspect he is going to miss his  window for initiation of HD. Multiple providers have verbalized that if patient does not consider dialysis, hospice will likely be his only other option.   Medication concerns reviewed with patient and pharmacy team. Barriers identified: Compliance with medical advice.   Length of Stay: 1  [place], [place]  02/25/2018, 9:12 AM  Advanced Heart Failure Team Pager [phone] (M-F; 7a - 4p)  Please contact [facility] for night-coverage after hours (4p -7a ) and weekends on [email or web address]   Agree with above.  62 y/o with advanced systolic HF due to severe NICM EF 25% and CKD IV in the setting of PCKD admitted with respiratory distress in setting of marked volume overload and progressive renal failure. Currently not responding to high-dose diuretics and dobutamine support. Weight up > 20 pounds.   On  exam  Tachypneic JVP to ear Cor RRR +s3 Lungs: + crackles Ab distended Ext 3+ edema  He is critically ill with advanced systolic HF and now likely ESRD. Will continue dobutamine. Start CVVHD (wife and patient finally willing to consent to this). I remain concerned about his ability to tolerate iHD in setting of severe HF.   CRITICAL CARE Performed by: Arvilla Meres  Total critical care time: 35 minutes  Critical care time was exclusive of separately billable procedures and treating other patients.  Critical care was necessary to treat or prevent imminent or life-threatening deterioration.  Critical care was time spent personally by me (independent of midlevel providers or residents) on the following activities: development of treatment plan with patient and/or surrogate as well as nursing, discussions with consultants, evaluation of patient's response to treatment, examination of patient, obtaining history from patient or surrogate, ordering and performing treatments and interventions, ordering and review of laboratory studies, ordering and review of radiographic studies, pulse  oximetry and re-evaluation of patient's condition.  Arvilla Meres, MD  6:57 PM

## 2018-02-25 NOTE — Progress Notes (Addendum)
[facility] Progress Note    Assessment/ Plan:   1.  Acute systolic heart failure: he is clearly decompensated.  Has been started empirically on dobut, Lasix, metolazone.  Will defer management of diuretics to advanced heart failure but I agree with their assessment- ultimately will likely require dialysis as he is having an inadequate response to increasing doses of Lasix (has been increased to 120 IV TID today).  I favor transfer to the ICU for initiation of CRRT for massive volume overload.  If pt not agreeable to dialysis, will need pall care.  I discussed with pt's wife in detail over the phone today- had left a VM yesterday, today I was able to get ahold of her.  She is agreeable, will discuss with pt some more.    2.  CKD IV-V: progressive.  Needs CRRT for vol removal; likely will end up ESRD.  Followed by Dr. Mosetta Pigeon in [place] but also has upcoming appt with Dr. Kathrene Bongo in our clinic as well.    3.  HCV- per primary  4.  Gout- per primary.  5.  Dispo: if agreeable, will move to ICU for CRRT.  Addend: discussed with pt, who called his wife as I was in the room.  They are both in agreement with initiation of CRRT.  HF team informed.  Will need access.  Orders written.   Subjective:    Hasn't made a decision about dialysis.  I called wife again today- explained the situation, she is agreeable to trying CRRT.  Will circle back to the pt and have another conversation with him.     Objective:   BP (!) 126/107 (BP Location: Right Arm)   Pulse (!) 108   Temp 97.6 F (36.4 C) (Oral)   Resp 18   Ht 5\' 9"  (1.753 m)   Wt 67 kg (147 lb 12.8 oz)   SpO2 100%   BMI 21.83 kg/m   Intake/Output Summary (Last 24 hours) at 02/25/2018 1121 Last data filed at 02/25/2018 0900 Gross per 24 hour  Intake 1130.13 ml  Output 1775 ml  Net -644.87 ml   Weight change: 0.363 kg (12.8 oz)  Physical Exam: GEN appears uncomfortable and SOB, a little less than  yesterday HEENT sclerae anicteric NECK +JVD to angle of mandible PULM bilateral crackles, wearing O2, increased WOB CV tachycardic, II/VI systolic murmur with +S3 ABD distended and diffusely mildly tender to palpation EXT 3+ pitting edema up the thighs  NEURO AAO x 3 SKIN warm and dry, no rashes or lesions    Imaging: Dg Chest 2 View  Result Date: 02/23/2018 CLINICAL DATA:  62 y/o  M; chest pain and shortness of breath. EXAM: CHEST - 2 VIEW COMPARISON:  02/18/2018 chest radiograph. FINDINGS: Cardiomegaly with single lead AICD. Pulmonary vascular congestion. Small bilateral pleural effusions. Bibasilar opacities. No pneumothorax. Bones are unremarkable. IMPRESSION: Cardiomegaly, pulmonary vascular congestion, and small bilateral pleural effusions. Bibasilar opacities probably represent associated atelectasis or edema. Electronically Signed   By: Mitzi Hansen M.D.   On: 02/23/2018 18:39   Ir Abdomen US Limited  Result Date: 02/24/2018 CLINICAL DATA:  Ascites, assess for paracentesis EXAM: LIMITED ABDOMEN ULTRASOUND FOR ASCITES TECHNIQUE: Limited ultrasound survey for ascites was performed in all four abdominal quadrants. COMPARISON:  01/22/2018 CT without contrast FINDINGS: Survey of the abdominal 4 quadrants demonstrates a small amount of abdominal ascites but not enough to warrant therapeutic paracentesis. Therefore the procedure not performed. IMPRESSION: Small amount of abdominal ascites. Electronically Signed  By: Osvaldo Shipper M.D.   On: 02/24/2018 13:51    Labs: BMET Recent Labs  Lab 02/19/18 0650 02/20/18 0450 02/21/18 0551 02/22/18 0442 02/23/18 1810 02/24/18 0426 02/25/18 0449  NA 133* 134* 133* 136 138 135 138  K 4.1 4.4 4.0 3.4* 4.0 3.4* 4.6  CL 97* 97* 97* 97* 96* 97* 98*  CO2 22 19* 23 26 31 26 29   GLUCOSE 115* 89 119* 103* 109* 106* 102*  BUN 71* 76* 88* 87* 80* 80* 88*  CREATININE 3.38* 3.62* 3.58* 3.32* 3.31* 3.23* 3.42*  CALCIUM 8.7* 8.8* 8.3* 8.3*  8.5* 8.3* 8.9  PHOS  --   --  5.4*  --   --   --   --    CBC Recent Labs  Lab 02/18/18 1616 02/19/18 0650 02/20/18 0450 02/21/18 0551 02/23/18 1810  WBC 7.0 7.3 7.7 7.2 6.4  NEUTROABS 5.4  --   --   --   --   HGB 12.3* 12.7* 12.7* 11.4* 11.2*  HCT 38.4* 40.3 39.8* 35.8* 38.4*  MCV 72.3* 72.2* 71.5* 71.9* 75.3*  PLT 262 279 314 254 259    Medications:    . heparin  5,000 Units Subcutaneous Q8H      Bufford Buttner, MD 02/25/2018, 11:21 AM

## 2018-02-25 NOTE — Consult Note (Signed)
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  He has an ICD.  Admitted on 6/18 with chief complaint Of worsening shortness of breath, progressive lower extremity edema, and scrotal and abdominal swelling for approximately 1 week.  He had just left AGAINST MEDICAL ADVICE on 6/16 after hospitalization at [facility] for volume overload.  During that hospitalization he required dobutamine infusion in addition to Lasix drip.  Since admission to [facility] therapeutic interventions have included supplemental oxygen, heart failure and nephrology consultation, aggressive IV diuresis, and inotropic support.  At time of critical care consult he is -506 mL; and has had really minimal response to high dosing IV Lasix.  We have been asked to transfer him to the intensive care where he can initiate CRRT for more aggressive volume removal.  PAST MEDICAL HISTORY :  He  has a past medical history of Anxiety, Bilateral renal masses, Chronic systolic congestive heart failure (HCC), CKD (chronic kidney disease) stage 4, GFR 15-29 ml/min (HCC), COPD (chronic obstructive pulmonary disease) (HCC), Hyperlipidemia, Hypertension, MI (myocardial infarction) (HCC), Mitral regurgitation, NICM (nonischemic cardiomyopathy) (HCC), Panic attacks, and Polycystic kidney.  PAST SURGICAL HISTORY: He  has a past surgical history that includes implantable cardioverter defibrillator (icd) generator change (Right, 06/23/2015) and Cardiac catheterization.  Allergies   Allergen Reactions  . Entresto [Sacubitril-Valsartan] Swelling    Swelling of the face  . Penicillins Anaphylaxis and Swelling    Has patient had a PCN reaction causing immediate rash, facial/tongue/throat swelling, SOB or lightheadedness with hypotension: Yes Has patient had a PCN reaction causing severe rash involving mucus membranes or skin necrosis: No Has patient had a PCN reaction that required hospitalization: No Has patient had a PCN reaction occurring within the last 10 years: No If all of the above answers are "NO", then may proceed with Cephalosporin use.   . Seroquel [Quetiapine] Nausea And Vomiting and Hives    Hives   . Ace Inhibitors Rash  . Corlanor [Ivabradine] Swelling    Facial swelling  . Ambien [Zolpidem Tartrate] Swelling  . Bidil [Isosorb Dinitrate-Hydralazine] Hives  . Carvedilol Other (See Comments)    "dizziness, light headed, and nausea"  . Hydroxyzine Nausea Only  . Prednisone Other (See Comments)    Causes fluid build up  . Sertraline Diarrhea    itching  . Trazodone And Nefazodone Swelling and Other (See Comments)    "up for days" and lip swelling  . Uloric [Febuxostat] Other (See Comments)    Jittery, jerky, makes him lose his mind  . Benadryl [Diphenhydramine Hcl] Other (See Comments)    "makes me crazy and hyper"  . Gabapentin Swelling    Lip and face swelling  . Lorazepam Rash    Rash and severe anxiety  . Torsemide Hives, Itching, Nausea And Vomiting and Rash    No current facility-administered medications on file prior to encounter.    Current Outpatient Medications on File Prior to Encounter  Medication Sig  . ALPRAZolam (XANAX) 1 MG tablet Take 1 tablet (1 mg total) by mouth 3 (three) times  daily as needed for up to 3 doses for sleep. (Patient not taking: Reported on 01/21/2018)  . B Complex Vitamins (B COMPLEX PO) Take 1 tablet by mouth daily.  . bumetanide (BUMEX) 1 MG tablet Take 3 tablets (3 mg total) by mouth daily. May take  additional 3 mg in the PM as needed for swelling/SOB  . busPIRone (BUSPAR) 5 MG tablet Take 1 tablet (5 mg total) 3 (three) times daily by mouth. (Patient not taking: Reported on 01/21/2018)  . chlordiazePOXIDE (LIBRIUM) 10 MG capsule Take 1 capsule (10 mg total) by mouth 4 (four) times daily as needed (agitation).  . Cholecalciferol (VITAMIN D3) 1000 units CAPS Take 1,000 Units by mouth daily.  . colchicine 0.6 MG tablet Take 0.6 mg by mouth 2 (two) times daily as needed. Gout flare up  . Glecaprevir-Pibrentasvir (MAVYRET) 100-40 MG TABS Take 3 tablets by mouth daily with breakfast. (Patient not taking: Reported on 01/21/2018)  . isosorbide-hydrALAZINE (BIDIL) 20-37.5 MG tablet Take 2 tablets 3 (three) times daily by mouth.  Marland Kitchen KLOR-CON M20 20 MEQ tablet Take 20 mEq by mouth daily.  . metolazone (ZAROXOLYN) 2.5 MG tablet Take 1 tablet (2.5 mg total) by mouth as directed. For weight of 140 lbs or greater (Patient not taking: Reported on 02/03/2018)  . predniSONE (DELTASONE) 5 MG tablet Take 5 mg by mouth daily.  . promethazine (PHENERGAN) 25 MG tablet Take 25 mg by mouth every 6 (six) hours as needed for nausea or vomiting.  . triamcinolone ointment (KENALOG) 0.1 % Apply 1 application topically 2 (two) times daily.    FAMILY HISTORY:  His indicated that his mother is alive. He indicated that his father is deceased. He indicated that his sister is alive. He indicated that his brother is alive. He indicated that his maternal grandmother is deceased. He indicated that his maternal grandfather is deceased. He indicated that his paternal grandmother is deceased. He indicated that his paternal grandfather is deceased. He indicated that both of his daughters are alive. He indicated that his son is alive.   SOCIAL HISTORY: He  reports that he has never smoked. He has never used smokeless tobacco. He reports that he does not drink alcohol or use drugs.  REVIEW OF SYSTEMS:   General: No fever, chills.  Does  have generalized weakness HEENT no headache, sore throat, nasal discharge.  Pulmonary: Exertional dyspnea, no significant cough, no chest pain currently cardiac: No palpitations or chest pain extremities: Persistent lower extremity edema no pain abdomen: No nausea vomiting diarrhea neuro: No loss of consciousness gait disturbance or seizures.  Psych: Anxious about need for dialysis  SUBJECTIVE:  Anxious to start feeling better VITAL SIGNS: Blood Pressure (Abnormal) 126/107 (BP Location: Right Arm)   Pulse (Abnormal) 108   Temperature 97.6 F (36.4 C) (Oral)   Respiration 18   Height 5\' 9"  (1.753 m)   Weight 147 lb 12.8 oz (67 kg)   Oxygen Saturation 100%   Body Mass Index 21.83 kg/m    HEMODYNAMICS:    VENTILATOR SETTINGS:    INTAKE / OUTPUT: I/O last 3 completed shifts: In: 1710.1 [P.O.:1540; I.V.:39.6; IV Piggyback:130.5] Out: 1925 [Urine:1925]  PHYSICAL EXAMINATION: General: Frail 62 year old male patient currently sitting upright in bed he is in no acute distress Neuro: Awake alert oriented no focal deficits HEENT: Normocephalic atraumatic notable jugular venous distention Cardiovascular: Diffuse systolic heart murmur 4 out of 6 Lungs: Basilar rales no accessory use Abdomen: Soft nontender no organomegaly Musculoskeletal: Equal strength and bulk Skin:  Positive for lower extremity edema  LABS:  BMET Recent Labs  Lab 02/23/18 1810 02/24/18 0426 02/25/18 0449  NA 138 135 138  K 4.0 3.4* 4.6  CL 96* 97* 98*  CO2 31 26 29   BUN 80* 80* 88*  CREATININE 3.31* 3.23* 3.42*  GLUCOSE 109* 106* 102*    Electrolytes Recent Labs  Lab 02/21/18 0551  02/23/18 1810 02/24/18 0426 02/25/18 0449  CALCIUM 8.3*   < > 8.5* 8.3* 8.9  MG 2.1  --   --  2.1  --   PHOS 5.4*  --   --   --   --    < > = values in this interval not displayed.    CBC Recent Labs  Lab 02/20/18 0450 02/21/18 0551 02/23/18 1810  WBC 7.7 7.2 6.4  HGB 12.7* 11.4* 11.2*  HCT 39.8* 35.8* 38.4*   PLT 314 254 259    Coag's No results for input(s): APTT, INR in the last 168 hours.  Sepsis Markers No results for input(s): LATICACIDVEN, PROCALCITON, O2SATVEN in the last 168 hours.  ABG No results for input(s): PHART, PCO2ART, PO2ART in the last 168 hours.  Liver Enzymes Recent Labs  Lab 02/18/18 1616 02/23/18 1810  AST 57* 34  ALT 28 22  ALKPHOS 104 99  BILITOT 1.2 1.2  ALBUMIN 3.4* 2.9*    Cardiac Enzymes Recent Labs  Lab 02/18/18 1616  TROPONINI 0.05*    Glucose No results for input(s): GLUCAP in the last 168 hours.  Imaging No results found.   STUDIES:  Echocardiogram obtained on 6/19:EF 20 to 25% the cavity size was mildly dilated.  Severe diffuse hypokinesis the left atrium was severely dilated the right ventricle was severely dilated the right atrium was severely dilated.  His estimated peak arterial systolic pressure was 45 mmHg  CULTURES:   ANTIBIOTICS:   SIGNIFICANT EVENTS:   LINES/TUBES:   DISCUSSION: 62 year old male patient with progressive nonischemic cardiomyopathy and resultant heart failure as well as volume overload.  This is been resistant to inotrope and diuresis.  Moving to the intensive care now for CRRT  ASSESSMENT / PLAN:  Acute on chronic systolic heart failure/HFrEF EF estimated 20 to 25%; in the setting of nonischemic cardiomyopathy Plan Transfer to intensive care for CRRT Continuing supplemental oxygen Inotropic support per heart failure team Continue telemetry monitoring Holding beta-blockade due to low cardiac output Holding BiDil He is allergic to carvedilol, entresto, torsemide and ACE inhibitors; making heart failure options limited  Hypoxia in the setting of heart failure and progressive pulmonary edema Plan Supplemental oxygen Volume removal with CRRT  Acute on chronic renal failure; stage IV chronic renal disease due to polycystic renal disease.  His baseline serum creatinine is 2.8-3, he is currently up  to 3.4 with diuresis and hemodynamic management -Nephrology following -He may have progressed to end-stage renal disease, unclear at this point Plan CRRT per nephrology Serial chemistries Strict intake output  At risk for fluid and electrolyte imbalance: Mild hyperkalemia Plan CRRT   Anemia of chronic disease Plan Trend CBC Continuing subcu heparin  History of gout. Plan Supportive care.  Note he is allergic to allopurinol, prednisone, and Uloric  Hepatitis C. Plan Continue Mavyret per ID   DVT prophylaxis: [place] heparin  SUP: NA  Diet: renal Activity: BR Disposition : ICU   FAMILY  - Updates:   - Inter-disciplinary family meet or Palliative Care meeting due by:  6/26  Simonne Martinet ACNP-BC [facility] Pulmonary/Critical Care Pager # [phone]  OR # [phone] if no answer     Pulmonary and Critical Care Medicine [facility] Pager: [phone]  02/25/2018, 2:10 PM

## 2018-02-25 NOTE — Procedures (Signed)
Central Venous dialysis Catheter Insertion Procedure Note Hermen Poelker [id number] [birth year]  Procedure: Insertion of Central Venous Catheter Indications: CRRt  Procedure Details Consent: Risks of procedure as well as the alternatives and risks of each were explained to the (patient/caregiver).  Consent for procedure obtained. Time Out: Verified patient identification, verified procedure, site/side was marked, verified correct patient position, special equipment/implants available, medications/allergies/relevent history reviewed, required imaging and test results available.  Performed Real-time ultrasound was used to identify and cannulate the right IJ vein  Maximum sterile technique was used including antiseptics, cap, gloves, gown, hand hygiene, mask and sheet. Skin prep: Chlorhexidine; local anesthetic administered A antimicrobial bonded/coated triple lumen catheter was placed in the right internal jugular vein using the Seldinger technique.  Evaluation Blood flow good Complications: No apparent complications Patient did tolerate procedure well. Chest X-ray ordered to verify placement.  CXR: pending.  Shelby Mattocks 02/25/2018, 3:01 PM Simonne Martinet ACNP-BC [facility] Pulmonary/Critical Care Pager # [phone] OR # [phone] if no answer

## 2018-02-26 ENCOUNTER — Inpatient Hospital Stay (HOSPITAL_COMMUNITY): Payer: BLUE CROSS/BLUE SHIELD

## 2018-02-26 DIAGNOSIS — R188 Other ascites: Secondary | ICD-10-CM

## 2018-02-26 DIAGNOSIS — N184 Chronic kidney disease, stage 4 (severe): Secondary | ICD-10-CM

## 2018-02-26 DIAGNOSIS — I5023 Acute on chronic systolic (congestive) heart failure: Secondary | ICD-10-CM

## 2018-02-26 DIAGNOSIS — R0602 Shortness of breath: Secondary | ICD-10-CM

## 2018-02-26 LAB — POCT ACTIVATED CLOTTING TIME
ACTIVATED CLOTTING TIME: 169 s
ACTIVATED CLOTTING TIME: 180 s
ACTIVATED CLOTTING TIME: 191 s
ACTIVATED CLOTTING TIME: 191 s
ACTIVATED CLOTTING TIME: 208 s
ACTIVATED CLOTTING TIME: 208 s
Activated Clotting Time: 180 seconds
Activated Clotting Time: 191 seconds
Activated Clotting Time: 180 seconds
Activated Clotting Time: 197 seconds
Activated Clotting Time: 202 seconds
Activated Clotting Time: 219 seconds
Activated Clotting Time: 219 seconds

## 2018-02-26 LAB — RENAL FUNCTION PANEL
ALBUMIN: 3.3 g/dL — AB (ref 3.5–5.0)
ALBUMIN: 2.5 g/dL — AB (ref 3.5–5.0)
Anion gap: 17 — ABNORMAL HIGH (ref 5–15)
Anion gap: 8 (ref 5–15)
BUN: 70 mg/dL — ABNORMAL HIGH (ref 6–20)
BUN: 38 mg/dL — ABNORMAL HIGH (ref 6–20)
CALCIUM: 8.9 mg/dL (ref 8.9–10.3)
CALCIUM: 7.3 mg/dL — AB (ref 8.9–10.3)
CO2: 22 mmol/L (ref 22–32)
CO2: 23 mmol/L (ref 22–32)
CREATININE: 1.88 mg/dL — AB (ref 0.61–1.24)
Chloride: 98 mmol/L — ABNORMAL LOW (ref 101–111)
Chloride: 108 mmol/L (ref 101–111)
Creatinine, Ser: 2.76 mg/dL — ABNORMAL HIGH (ref 0.61–1.24)
GFR calc Af Amer: 27 mL/min — ABNORMAL LOW (ref >60)
GFR calc non Af Amer: 23 mL/min — ABNORMAL LOW (ref >60)
GFR, EST AFRICAN AMERICAN: 43 mL/min — AB (ref >60)
GFR, EST NON AFRICAN AMERICAN: 37 mL/min — AB (ref >60)
GLUCOSE: 116 mg/dL — AB (ref 65–99)
Glucose, Bld: 81 mg/dL (ref 65–99)
PHOSPHORUS: 3.4 mg/dL (ref 2.5–4.6)
Phosphorus: 2.4 mg/dL — ABNORMAL LOW (ref 2.5–4.6)
Potassium: 4.2 mmol/L (ref 3.5–5.1)
Potassium: 3.6 mmol/L (ref 3.5–5.1)
SODIUM: 137 mmol/L (ref 135–145)
SODIUM: 139 mmol/L (ref 135–145)

## 2018-02-26 LAB — BLOOD GAS, ARTERIAL
Acid-Base Excess: 1.7 mmol/L (ref 0.0–2.0)
Bicarbonate: 25.1 mmol/L (ref 20.0–28.0)
Drawn by: 533091
O2 CONTENT: 2 L/min
O2 SAT: 92 %
PATIENT TEMPERATURE: 98.6
PCO2 ART: 34.6 mmHg (ref 32.0–48.0)
pH, Arterial: 7.474 — ABNORMAL HIGH (ref 7.350–7.450)
pO2, Arterial: 64.6 mmHg — ABNORMAL LOW (ref 83.0–108.0)

## 2018-02-26 LAB — CBC WITH DIFFERENTIAL/PLATELET
BASOS ABS: 0.1 10*3/uL (ref 0.0–0.1)
BASOS PCT: 1 %
EOS ABS: 0.1 10*3/uL (ref 0.0–0.7)
Eosinophils Relative: 1 %
HCT: 40.7 % (ref 39.0–52.0)
HEMOGLOBIN: 12 g/dL — AB (ref 13.0–17.0)
LYMPHS PCT: 15 %
Lymphs Abs: 1 10*3/uL (ref 0.7–4.0)
MCH: 22.4 pg — ABNORMAL LOW (ref 26.0–34.0)
MCHC: 29.5 g/dL — AB (ref 30.0–36.0)
MCV: 75.9 fL — ABNORMAL LOW (ref 78.0–100.0)
Monocytes Absolute: 0.6 10*3/uL (ref 0.1–1.0)
Monocytes Relative: 8 %
NEUTROS ABS: 5.1 10*3/uL (ref 1.7–7.7)
Neutrophils Relative %: 75 %
Platelets: 260 10*3/uL (ref 150–400)
RBC: 5.36 MIL/uL (ref 4.22–5.81)
RDW: 27.9 % — ABNORMAL HIGH (ref 11.5–15.5)
WBC: 6.9 10*3/uL (ref 4.0–10.5)

## 2018-02-26 LAB — POCT I-STAT 3, ART BLOOD GAS (G3+)
BICARBONATE: 21.4 mmol/L (ref 20.0–28.0)
O2 Saturation: 99 %
TCO2: 22 mmol/L (ref 22–32)
pCO2 arterial: 23.7 mmHg — ABNORMAL LOW (ref 32.0–48.0)
pH, Arterial: 7.561 — ABNORMAL HIGH (ref 7.350–7.450)
pO2, Arterial: 113 mmHg — ABNORMAL HIGH (ref 83.0–108.0)

## 2018-02-26 LAB — PROTIME-INR
INR: 1.27
Prothrombin Time: 15.8 seconds — ABNORMAL HIGH (ref 11.4–15.2)

## 2018-02-26 LAB — COOXEMETRY PANEL
Carboxyhemoglobin: 1.8 % — ABNORMAL HIGH (ref 0.5–1.5)
Methemoglobin: 0.8 % (ref 0.0–1.5)
O2 SAT: 52.1 %
Total hemoglobin: 11.3 g/dL — ABNORMAL LOW (ref 12.0–16.0)

## 2018-02-26 LAB — MAGNESIUM: Magnesium: 2.2 mg/dL (ref 1.7–2.4)

## 2018-02-26 LAB — CK: Total CK: 113 U/L (ref 49–397)

## 2018-02-26 LAB — APTT: APTT: 146 s — AB (ref 24–36)

## 2018-02-26 LAB — BRAIN NATRIURETIC PEPTIDE: B NATRIURETIC PEPTIDE 5: 2861.5 pg/mL — AB (ref 0.0–100.0)

## 2018-02-26 MED ORDER — DEXMEDETOMIDINE HCL IN NACL 400 MCG/100ML IV SOLN
0.4000 ug/kg/h | INTRAVENOUS | Status: DC
  Administered 2018-02-26: 0.5 ug/kg/h via INTRAVENOUS
  Administered 2018-02-26 – 2018-02-27 (×2): 0.4 ug/kg/h via INTRAVENOUS
  Filled 2018-02-26: qty 100
  Filled 2018-02-26: qty 200

## 2018-02-26 NOTE — Progress Notes (Signed)
eLink Physician-Brief Progress Note Patient Name: Kelly Leonard DOB: [birth year] MRN: [id number]   Recent Labs  Lab 02/26/18 0815 02/26/18 1200 02/26/18 1927  PHART  --  7.474* 7.561*  PCO2ART  --  34.6 23.7*  PO2ART  --  64.6* 113.0*  HCO3  --  25.1 21.4  TCO2  --   --  22  O2SAT 52.1 92.0 99.0    Spont dev of resp alk Per RN - Unable to void with 300cc bladder urinary retention  Plan Place foley   Intervention Category Major Interventions: Acid-Base disturbance - evaluation and management;Other:  Kelly Leonard 02/26/2018, 8:37 PM

## 2018-02-26 NOTE — Progress Notes (Signed)
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  He has an ICD.  Admitted on 6/18 with chief complaint Of worsening shortness of breath, progressive lower extremity edema, and scrotal and abdominal swelling for approximately 1 week.  He had just left AGAINST MEDICAL ADVICE on 6/16 after hospitalization at [facility] for volume overload.  During that hospitalization he required dobutamine infusion in addition to Lasix drip.  Since admission to [facility] therapeutic interventions have included supplemental oxygen, heart failure and nephrology consultation, aggressive IV diuresis, and inotropic support.  At time of critical care consult he is -506 mL; and has had really minimal response to high dosing IV Lasix.  We have been asked to transfer him to the intensive care where he can initiate CRRT for more aggressive volume removal.    SUBJECTIVE:  Anxious, not really much change in comparison to yesterday VITAL SIGNS: Blood Pressure (Abnormal) 135/103   Pulse (Abnormal) 110   Temperature (Abnormal) 97.5 F (36.4 C) (Oral)   Respiration (Abnormal) 36   Height 5\' 9"  (1.753 m)   Weight 143 lb 11.8 oz (65.2 kg)   Oxygen Saturation 94%   Body Mass Index 21.23 kg/m   HEMODYNAMICS:    VENTILATOR SETTINGS:    INTAKE / OUTPUT: I/O last 3 completed shifts: In: 1287.7 [P.O.:960; I.V.:88.7; Other:22; IV Piggyback:217] Out: 4098 [JXBJY:7829; Other:3412]  PHYSICAL EXAMINATION: General: This is a frail 62 year old L patient  currently resting in bed.  He is anxious but in no distress currently having just received Xanax HEENT normocephalic atraumatic some jugular venous distention still appreciated Pulmonary: Tachypneic at times, diminished bases, no accessory use currently Cardiac: Systolic murmur; appreciated no radiation currently regular rhythm but more tachycardic Abdomen: Soft nontender no organomegaly Extremities: Persistent lower extremity edema warm, strong pulses, brisk cap refill Neuro/psych: Awake, oriented, no focal motor deficits, does report he is anxious LABS:  BMET Recent Labs  Lab 02/25/18 0449 02/25/18 1841 02/26/18 0200  NA 138 138 137  K 4.6 3.9 4.2  CL 98* 98* 98*  CO2 29 25 22   BUN 88* 80* 70*  CREATININE 3.42* 3.09* 2.76*  GLUCOSE 102* 119* 116*    Electrolytes Recent Labs  Lab 02/21/18 0551  02/24/18 0426 02/25/18 0449 02/25/18 1841 02/26/18 0200  CALCIUM 8.3*   < > 8.3* 8.9 8.7* 8.9  MG 2.1  --  2.1  --   --  2.2  PHOS 5.4*  --   --   --  3.7 3.4   < > = values in this interval not displayed.    CBC Recent Labs  Lab 02/23/18 1810 02/25/18 1345 02/26/18 0200  WBC 6.4 6.1 6.9  HGB 11.2* 11.5* 12.0*  HCT 38.4* 38.9* 40.7  PLT 259 260 260    Coag's Recent Labs  Lab 02/26/18 0200  APTT 146*  INR 1.27    Sepsis Markers No results for input(s): LATICACIDVEN, PROCALCITON, O2SATVEN in the last 168 hours.  ABG No results for input(s): PHART, PCO2ART, PO2ART in the last 168 hours.  Liver Enzymes Recent Labs  Lab 02/23/18 1810 02/25/18 1841 02/26/18 0200  AST 34  --   --   ALT 22  --   --   ALKPHOS 99  --   --   BILITOT 1.2  --   --   ALBUMIN 2.9* 3.1* 3.3*    Cardiac Enzymes No results for input(s): TROPONINI, PROBNP in the last 168 hours.  Glucose No results for input(s): GLUCAP in the last 168 hours.  Imaging Dg Chest Port 1 View  Result Date: 02/26/2018 CLINICAL DATA:  Shortness of breath. EXAM: PORTABLE CHEST 1 VIEW COMPARISON:   Radiograph February 25, 2018. FINDINGS: Stable cardiomegaly and central pulmonary vascular congestion. Single lead left-sided pacemaker is unchanged in position. Right internal jugular catheter is unchanged. No pneumothorax is noted. Stable left basilar atelectasis or infiltrate is noted with probable small pleural effusion. Increased right basilar opacity is noted concerning for worsening edema or atelectasis with associated pleural effusion. Bony thorax is unremarkable. IMPRESSION: Stable cardiomegaly and central pulmonary vascular congestion. Stable left basilar atelectasis or infiltrate is noted with probable small pleural effusion. Increased right basilar opacity is noted concerning for worsening edema or atelectasis with associated pleural effusion. Electronically Signed   By: Lupita Raider, M.D.   On: 02/26/2018 08:37   Dg Chest Port 1 View  Result Date: 02/25/2018 CLINICAL DATA:  Shortness of breath. EXAM: PORTABLE CHEST 1 VIEW COMPARISON:  Radiographs of February 23, 2018. FINDINGS: Stable cardiomegaly. Single lead left-sided pacemaker is unchanged in position. No pneumothorax is noted. Right internal jugular venous sheath is noted. Stable left basilar atelectasis or infiltrate is noted with associated pleural effusion. Mildly increased right infrahilar atelectasis or infiltrate is noted. Bony thorax is unremarkable. IMPRESSION: Stable left basilar atelectasis or infiltrate is noted with associated pleural effusion. Increased right infrahilar atelectasis or infiltrate is Electronically Signed   By: Lupita Raider, M.D.   On: 02/25/2018 15:38     STUDIES:  Echocardiogram obtained on 6/19:EF 20 to 25% the cavity size was mildly dilated.  Severe diffuse hypokinesis the left atrium was severely dilated the right ventricle was severely dilated the right atrium was severely dilated.  His estimated peak arterial systolic pressure was 45 mmHg  CULTURES:   ANTIBIOTICS:   SIGNIFICANT  EVENTS:   LINES/TUBES:   DISCUSSION: 62 year old male patient with progressive nonischemic cardiomyopathy and resultant heart failure as well as volume overload.  This is been resistant to inotrope and diuresis.  Moving to the intensive care now for CRRT  ASSESSMENT / PLAN:  Acute on chronic systolic heart failure/HFrEF EF estimated 20 to 25%; in the setting of nonischemic cardiomyopathy Plan Continue inotropic support Continue CRRT for volume removal Continue telemetry monitoring Holding beta-blockade due to low cardiac output Holding BiDil Additional therapy per cardiology   Hypoxia in the setting of heart failure and progressive pulmonary edema Portable chest x-ray obtained on 6/19: Showed worsening pulmonary edema Plan Volume removal Supplemental oxygen Repeat chest x-ray  Acute on chronic renal failure; stage IV chronic renal disease due to polycystic renal disease.  His baseline serum creatinine is 2.8-3, he is currently up to 3.4 with diuresis and hemodynamic management -Nephrology following -He may have progressed to end-stage renal disease, unclear at this point Plan Continuing CRRT per nephrology with goal to remove fluid   Anemia of chronic disease Plan Trend CBC Continue subcu heparin History of gout. Plan Continuing supportive care allergic to allopurinol, prednisone, and uloric   Hepatitis C. Plan Continue Mavyret   DVT prophylaxis:  [place] heparin  SUP: NA  Diet: renal Activity: BR Disposition : ICU   FAMILY  - Updates:   - Inter-disciplinary family meet or Palliative Care meeting due by:  6/26  Simonne Martinet ACNP-BC [facility] Pulmonary/Critical Care Pager # [phone] OR # [phone] if no answer     02/26/2018, 9:40 AM

## 2018-02-26 NOTE — Progress Notes (Signed)
Patient respiratory status is ok but on and off agitated and when wakes up breaths fast <unsure if its anxiety or worsening fluid ol> Will get another abg Cut back on precedex Get CXR and follow up  Will let night team know  Roseanne Reno Pulmonary Critical Care & Sleep Medicine

## 2018-02-26 NOTE — Progress Notes (Addendum)
Advanced Heart Failure Rounding Note  PCP-Cardiologist: Kelly Meres, MD   Subjective:    Moved to ICU 02/25/18 for initiation of CRRT.  Feeling slightly better today. But still SOB and weak. Anxious. Tolerating ~100 cc/hr fluid removal. BP stable to high, with narrow pulse pressure  Objective:   Weight Range: 143 lb 11.8 oz (65.2 kg) Body mass index is 21.23 kg/m.   Vital Signs:   Temp:  [97.3 F (36.3 C)-98 F (36.7 C)] 97.4 F (36.3 C) (06/20 0500) Pulse Rate:  [103-116] 107 (06/20 0700) Resp:  [4-39] 25 (06/20 0700) BP: (89-155)/(71-128) 128/107 (06/20 0700) SpO2:  [93 %-100 %] 98 % (06/20 0700) Weight:  [143 lb 11.8 oz (65.2 kg)] 143 lb 11.8 oz (65.2 kg) (06/20 0500) Last BM Date: 02/24/18  Weight change: Filed Weights   02/24/18 0447 02/25/18 0500 02/26/18 0500  Weight: 148 lb 12.8 oz (67.5 kg) 147 lb 12.8 oz (67 kg) 143 lb 11.8 oz (65.2 kg)    Intake/Output:   Intake/Output Summary (Last 24 hours) at 02/26/2018 0749 Last data filed at 02/26/2018 0700 Gross per 24 hour  Intake 717.57 ml  Output 4387 ml  Net -3669.43 ml      Physical Exam    General: Chronically ill and fatigued appearing.  HEENT: Normal, edematous below eyes Neck: Supple. JVP to jaw. Carotids 2+ bilat; no bruits. No thyromegaly or nodule noted. Cor: PMI nondisplaced. Regular. No M/G/R noted Lungs: Clear.  Abdomen: Soft, non-tender, non-distended, no HSM. No bruits or masses. +BS  Extremities: No cyanosis, clubbing, or rash. 2-3+ BLE edema.  Neuro: Alert & orientedx3, cranial nerves grossly intact. moves all 4 extremities w/o difficulty. Affect pleasant   Telemetry   NSR 100-110s, personally reviewed.   EKG    No new tracings.    Labs    CBC Recent Labs    02/25/18 1345 02/26/18 0200  WBC 6.1 6.9  NEUTROABS  --  5.1  HGB 11.5* 12.0*  HCT 38.9* 40.7  MCV 75.0* 75.9*  PLT 260 260   Basic Metabolic Panel Recent Labs    83/25/49 0426  02/25/18 1841  02/26/18 0200  NA 135   < > 138 137  K 3.4*   < > 3.9 4.2  CL 97*   < > 98* 98*  CO2 26   < > 25 22  GLUCOSE 106*   < > 119* 116*  BUN 80*   < > 80* 70*  CREATININE 3.23*   < > 3.09* 2.76*  CALCIUM 8.3*   < > 8.7* 8.9  MG 2.1  --   --  2.2  PHOS  --   --  3.7 3.4   < > = values in this interval not displayed.   Liver Function Tests Recent Labs    02/23/18 1810 02/25/18 1841 02/26/18 0200  AST 34  --   --   ALT 22  --   --   ALKPHOS 99  --   --   BILITOT 1.2  --   --   PROT 6.3*  --   --   ALBUMIN 2.9* 3.1* 3.3*   No results for input(s): LIPASE, AMYLASE in the last 72 hours. Cardiac Enzymes Recent Labs    02/26/18 0200  CKTOTAL 113    BNP: BNP (last 3 results) Recent Labs    02/22/18 0442 02/23/18 1810 02/26/18 0214  BNP 3,404.0* >4,500.0* 2,861.5*    ProBNP (last 3 results) No results for input(s): PROBNP in  the last 8760 hours.   D-Dimer No results for input(s): DDIMER in the last 72 hours. Hemoglobin A1C No results for input(s): HGBA1C in the last 72 hours. Fasting Lipid Panel No results for input(s): CHOL, HDL, LDLCALC, TRIG, CHOLHDL, LDLDIRECT in the last 72 hours. Thyroid Function Tests No results for input(s): TSH, T4TOTAL, T3FREE, THYROIDAB in the last 72 hours.  Invalid input(s): FREET3  Other results:   Imaging    Dg Chest Port 1 View  Result Date: 02/25/2018 CLINICAL DATA:  Shortness of breath. EXAM: PORTABLE CHEST 1 VIEW COMPARISON:  Radiographs of February 23, 2018. FINDINGS: Stable cardiomegaly. Single lead left-sided pacemaker is unchanged in position. No pneumothorax is noted. Right internal jugular venous sheath is noted. Stable left basilar atelectasis or infiltrate is noted with associated pleural effusion. Mildly increased right infrahilar atelectasis or infiltrate is noted. Bony thorax is unremarkable. IMPRESSION: Stable left basilar atelectasis or infiltrate is noted with associated pleural effusion. Increased right infrahilar  atelectasis or infiltrate is Electronically Signed   By: Lupita Raider, M.D.   On: 02/25/2018 15:38     Medications:     Scheduled Medications: . Chlorhexidine Gluconate Cloth  6 each Topical Daily  . heparin  5,000 Units Subcutaneous Q8H  . mouth rinse  15 mL Mouth Rinse BID  . OLANZapine  2.5 mg Oral QHS    Infusions: . DOBUTamine 2.5 mcg/kg/min (02/26/18 0700)  . furosemide Stopped (02/25/18 2251)  . heparin 10,000 units/ 20 mL infusion syringe 1,000 Units/hr (02/26/18 0628)  . dialysis replacement fluid (prismasate) 500 mL/hr at 02/26/18 0707  . dialysis replacement fluid (prismasate) 200 mL/hr at 02/25/18 2039  . dialysate (PRISMASATE) 1,000 mL/hr at 02/26/18 0709  . sodium chloride      PRN Medications: ALPRAZolam, heparin, ondansetron **OR** ondansetron (ZOFRAN) IV, sodium chloride, sodium chloride flush    Patient Profile   Kelly Leonard is a 62 y.o. 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   Admitted 02/24/18 with A/C systolic CHF.   Assessment/Plan   1. A/C Systolic HF, NICM.  - Echo 06/2017 EF 25% - Volume status remains elevated - Continue CRRT on 100 ml/hr. Increase pull as tolerated.  - Continue Dobutamine 2.5 mcg/kg/min.  -No bb with component of low output.  -Allergicto entresto, carvedilol, torsemide, and ACE -Bidil on hold. Leave room for diuresis and renal perfusion.   2. AKI on CKD Stage IV -has severePolycystic Kidney Disease - Baseline Cr thought to be 3.6-3.8. - Continue CRRT. Pt and wife have been very hesitant to consider HD.   3. HCV -He has been followed by ID and has been onmavyret per ID - No change to current plan.    4. Gout -Allergic to allopurinol,prednisone, and uloric. - Per primary.   5. Hypokalemia - K 4.2. No supp today.   6. Prognosis - Now in CRRT. It is unclear if he would tolerate HD.  Will have to watch for renal recovery for prognosis.   Medication concerns reviewed with patient and pharmacy team. Barriers identified: Compliance with medical advice.   Length of Stay: 2  Kelly, Leonard  02/26/2018, 7:49 AM  Advanced Heart Failure Team Pager [phone] (M-F; 7a - 4p)  Please contact [facility] for night-coverage after hours (4p -7a ) and weekends on [email or web address]  Agree  Remains extremely tenuous. Now on CVVHD. Weight down 4 pounds overnight. BP elevated but with  very narrow pulse pressure. Tolerating -100/hr ok.   In bed RIJ trialysis Cor RRR +s3 Lungs CTA Ab soft  Ext 2+ edema  He remains extremely tenuous. Volume status improving on CVVHD but still markedly volume overloaded. Urine output remains poor. Increase CVVHD rate as tolerated. Main issue is how he will tolerate iHD. D/w Dr. Signe Colt at bedside.   CRITICAL CARE Performed by: Kelly Leonard  Total critical care time: 35 minutes  Critical care time was exclusive of separately billable procedures and treating other patients.  Critical care was necessary to treat or prevent imminent or life-threatening deterioration.  Critical care was time spent personally by me (independent of midlevel providers or residents) on the following activities: development of treatment plan with patient and/or surrogate as well as nursing, discussions with consultants, evaluation of patient's response to treatment, examination of patient, obtaining history from patient or surrogate, ordering and performing treatments and interventions, ordering and review of laboratory studies, ordering and review of radiographic studies, pulse oximetry and re-evaluation of patient's condition.  Kelly Meres, MD  8:02 AM

## 2018-02-26 NOTE — Progress Notes (Signed)
[facility] Progress Note    Assessment/ Plan:   1. Acute systolic heart failure: he is clearly decompensated, EF 20-25%. Started empirically on dobut, Lasix, metolazone with little response.  Was transferred to ICU 6/19 and initiated CRRT after lengthy discussion with pt and wife.  Tolerating well, all 4K bath, heparin, ACTs OK, most recent APTT 146.  Will increase UF rate to 200 mL/ hr as tolerated.      2. CKD IV-V: progressive. Needs CRRT for vol removal; likely will end up ESRD--> ? If he will be able to tolerate IHD due to sig cardiomyopathy but remains to be seen.  Followed by Dr. Mosetta Pigeon in [place] but also has upcoming appt with Dr. Kathrene Bongo in our clinic as well.   3. HCV- per primary  4. Gout- per primary.  5.  Dispo: In ICU for CRRT   Subjective:    Moved to ICU and started CRRT for massive vol removal.  Tolerating net 100 mL/ hr off.     Objective:   BP (!) 116/99   Pulse (!) 102   Temp (!) 97.5 F (36.4 C) (Oral)   Resp (!) 21   Ht 5\' 9"  (1.753 m)   Wt 65.2 kg (143 lb 11.8 oz)   SpO2 99%   BMI 21.23 kg/m   Intake/Output Summary (Last 24 hours) at 02/26/2018 0841 Last data filed at 02/26/2018 0800 Gross per 24 hour  Intake 717.57 ml  Output 4684 ml  Net -3966.43 ml   Weight change: -1.842 kg (-4 lb 1 oz)  Physical Exam: GEN appears SOB, slightly better than yesterday HEENT sclerae anicteric NECK +JVD, still at mandible, slightly improved PULM bilateral crackles, wearing O2, increased WOB but slightly improved CV tachycardic, II/VI systolic murmur with +S3 ABD distended and diffusely mildly tender to palpation EXT 2+ pitting edema up the thighs, improved NEURO AAO x 3 SKIN warm and dry, no rashes or lesions   Imaging: Dg Chest Port 1 View  Result Date: 02/26/2018 CLINICAL DATA:  Shortness of breath. EXAM: PORTABLE CHEST 1 VIEW COMPARISON:  Radiograph February 25, 2018. FINDINGS: Stable cardiomegaly and central  pulmonary vascular congestion. Single lead left-sided pacemaker is unchanged in position. Right internal jugular catheter is unchanged. No pneumothorax is noted. Stable left basilar atelectasis or infiltrate is noted with probable small pleural effusion. Increased right basilar opacity is noted concerning for worsening edema or atelectasis with associated pleural effusion. Bony thorax is unremarkable. IMPRESSION: Stable cardiomegaly and central pulmonary vascular congestion. Stable left basilar atelectasis or infiltrate is noted with probable small pleural effusion. Increased right basilar opacity is noted concerning for worsening edema or atelectasis with associated pleural effusion. Electronically Signed   By: Lupita Raider, M.D.   On: 02/26/2018 08:37   Dg Chest Port 1 View  Result Date: 02/25/2018 CLINICAL DATA:  Shortness of breath. EXAM: PORTABLE CHEST 1 VIEW COMPARISON:  Radiographs of February 23, 2018. FINDINGS: Stable cardiomegaly. Single lead left-sided pacemaker is unchanged in position. No pneumothorax is noted. Right internal jugular venous sheath is noted. Stable left basilar atelectasis or infiltrate is noted with associated pleural effusion. Mildly increased right infrahilar atelectasis or infiltrate is noted. Bony thorax is unremarkable. IMPRESSION: Stable left basilar atelectasis or infiltrate is noted with associated pleural effusion. Increased right infrahilar atelectasis or infiltrate is Electronically Signed   By: Lupita Raider, M.D.   On: 02/25/2018 15:38   Ir Abdomen US Limited  Result Date: 02/24/2018 CLINICAL DATA:  Ascites, assess  for paracentesis EXAM: LIMITED ABDOMEN ULTRASOUND FOR ASCITES TECHNIQUE: Limited ultrasound survey for ascites was performed in all four abdominal quadrants. COMPARISON:  01/22/2018 CT without contrast FINDINGS: Survey of the abdominal 4 quadrants demonstrates a small amount of abdominal ascites but not enough to warrant therapeutic paracentesis.  Therefore the procedure not performed. IMPRESSION: Small amount of abdominal ascites. Electronically Signed   By: Judie Petit.  Shick M.D.   On: 02/24/2018 13:51    Labs: BMET Recent Labs  Lab 02/21/18 0551 02/22/18 0442 02/23/18 1810 02/24/18 0426 02/25/18 0449 02/25/18 1841 02/26/18 0200  NA 133* 136 138 135 138 138 137  K 4.0 3.4* 4.0 3.4* 4.6 3.9 4.2  CL 97* 97* 96* 97* 98* 98* 98*  CO2 23 26 31 26 29 25 22   GLUCOSE 119* 103* 109* 106* 102* 119* 116*  BUN 88* 87* 80* 80* 88* 80* 70*  CREATININE 3.58* 3.32* 3.31* 3.23* 3.42* 3.09* 2.76*  CALCIUM 8.3* 8.3* 8.5* 8.3* 8.9 8.7* 8.9  PHOS 5.4*  --   --   --   --  3.7 3.4   CBC Recent Labs  Lab 02/21/18 0551 02/23/18 1810 02/25/18 1345 02/26/18 0200  WBC 7.2 6.4 6.1 6.9  NEUTROABS  --   --   --  5.1  HGB 11.4* 11.2* 11.5* 12.0*  HCT 35.8* 38.4* 38.9* 40.7  MCV 71.9* 75.3* 75.0* 75.9*  PLT 254 259 260 260    Medications:    . Chlorhexidine Gluconate Cloth  6 each Topical Daily  . heparin  5,000 Units Subcutaneous Q8H  . mouth rinse  15 mL Mouth Rinse BID  . OLANZapine  2.5 mg Oral QHS      Bufford Buttner MD 02/26/2018, 8:41 AM

## 2018-02-27 ENCOUNTER — Other Ambulatory Visit: Payer: Self-pay | Admitting: *Deleted

## 2018-02-27 ENCOUNTER — Inpatient Hospital Stay (HOSPITAL_COMMUNITY): Payer: BLUE CROSS/BLUE SHIELD

## 2018-02-27 DIAGNOSIS — J810 Acute pulmonary edema: Secondary | ICD-10-CM

## 2018-02-27 LAB — BLOOD GAS, ARTERIAL
ACID-BASE DEFICIT: 1 mmol/L (ref 0.0–2.0)
Bicarbonate: 21.5 mmol/L (ref 20.0–28.0)
Drawn by: 274071
FIO2: 21
O2 SAT: 97.5 %
PCO2 ART: 26.3 mmHg — AB (ref 32.0–48.0)
Patient temperature: 98.6
pH, Arterial: 7.523 — ABNORMAL HIGH (ref 7.350–7.450)
pO2, Arterial: 91 mmHg (ref 83.0–108.0)

## 2018-02-27 LAB — RENAL FUNCTION PANEL
Albumin: 2.9 g/dL — ABNORMAL LOW (ref 3.5–5.0)
Albumin: 2.9 g/dL — ABNORMAL LOW (ref 3.5–5.0)
Anion gap: 10 (ref 5–15)
Anion gap: 10 (ref 5–15)
BUN: 40 mg/dL — AB (ref 6–20)
BUN: 32 mg/dL — AB (ref 6–20)
CHLORIDE: 104 mmol/L (ref 101–111)
CHLORIDE: 102 mmol/L (ref 101–111)
CO2: 23 mmol/L (ref 22–32)
CO2: 26 mmol/L (ref 22–32)
Calcium: 8.6 mg/dL — ABNORMAL LOW (ref 8.9–10.3)
Calcium: 8.8 mg/dL — ABNORMAL LOW (ref 8.9–10.3)
Creatinine, Ser: 2.04 mg/dL — ABNORMAL HIGH (ref 0.61–1.24)
Creatinine, Ser: 1.75 mg/dL — ABNORMAL HIGH (ref 0.61–1.24)
GFR calc Af Amer: 39 mL/min — ABNORMAL LOW (ref >60)
GFR calc Af Amer: 47 mL/min — ABNORMAL LOW (ref >60)
GFR calc non Af Amer: 40 mL/min — ABNORMAL LOW (ref >60)
GFR, EST NON AFRICAN AMERICAN: 33 mL/min — AB (ref >60)
GLUCOSE: 117 mg/dL — AB (ref 65–99)
Glucose, Bld: 85 mg/dL (ref 65–99)
POTASSIUM: 4.4 mmol/L (ref 3.5–5.1)
POTASSIUM: 3.9 mmol/L (ref 3.5–5.1)
Phosphorus: 3 mg/dL (ref 2.5–4.6)
Phosphorus: 2.4 mg/dL — ABNORMAL LOW (ref 2.5–4.6)
Sodium: 137 mmol/L (ref 135–145)
Sodium: 138 mmol/L (ref 135–145)

## 2018-02-27 LAB — COOXEMETRY PANEL
CARBOXYHEMOGLOBIN: 1.4 % (ref 0.5–1.5)
Carboxyhemoglobin: 1.3 % (ref 0.5–1.5)
METHEMOGLOBIN: 0.9 % (ref 0.0–1.5)
Methemoglobin: 1.3 % (ref 0.0–1.5)
O2 SAT: 42 %
O2 Saturation: 35.6 %
TOTAL HEMOGLOBIN: 10.7 g/dL — AB (ref 12.0–16.0)
Total hemoglobin: 11.2 g/dL — ABNORMAL LOW (ref 12.0–16.0)

## 2018-02-27 LAB — CBC
HEMATOCRIT: 36.7 % — AB (ref 39.0–52.0)
Hemoglobin: 10.9 g/dL — ABNORMAL LOW (ref 13.0–17.0)
MCH: 21.8 pg — ABNORMAL LOW (ref 26.0–34.0)
MCHC: 29.7 g/dL — ABNORMAL LOW (ref 30.0–36.0)
MCV: 73.5 fL — ABNORMAL LOW (ref 78.0–100.0)
PLATELETS: 225 10*3/uL (ref 150–400)
RBC: 4.99 MIL/uL (ref 4.22–5.81)
RDW: 27.1 % — AB (ref 11.5–15.5)
WBC: 7 10*3/uL (ref 4.0–10.5)

## 2018-02-27 LAB — POCT ACTIVATED CLOTTING TIME
ACTIVATED CLOTTING TIME: 241 s
ACTIVATED CLOTTING TIME: 230 s
ACTIVATED CLOTTING TIME: 92 s
Activated Clotting Time: 125 seconds
Activated Clotting Time: 131 seconds
Activated Clotting Time: 246 seconds
Activated Clotting Time: 252 seconds

## 2018-02-27 LAB — DIFFERENTIAL
BASOS ABS: 0.1 10*3/uL (ref 0.0–0.1)
Basophils Relative: 1 %
Eosinophils Absolute: 0.1 10*3/uL (ref 0.0–0.7)
Eosinophils Relative: 2 %
LYMPHS ABS: 1.1 10*3/uL (ref 0.7–4.0)
Lymphocytes Relative: 16 %
MONO ABS: 0.7 10*3/uL (ref 0.1–1.0)
MONOS PCT: 10 %
NEUTROS PCT: 71 %
Neutro Abs: 5 10*3/uL (ref 1.7–7.7)

## 2018-02-27 LAB — PROTIME-INR
INR: 1.54
PROTHROMBIN TIME: 18.3 s — AB (ref 11.4–15.2)

## 2018-02-27 LAB — APTT
aPTT: >200 seconds (ref 24–36)
aPTT: 103 seconds — ABNORMAL HIGH (ref 24–36)

## 2018-02-27 LAB — MAGNESIUM: MAGNESIUM: 2.3 mg/dL (ref 1.7–2.4)

## 2018-02-27 LAB — CK: Total CK: 108 U/L (ref 49–397)

## 2018-02-27 MED ORDER — "THROMBI-PAD 3""X3"" EX PADS"
1.0000 | MEDICATED_PAD | Freq: Once | CUTANEOUS | Status: AC
  Administered 2018-02-27: 1 via TOPICAL
  Filled 2018-02-27: qty 1

## 2018-02-27 MED ORDER — ALPRAZOLAM 0.25 MG PO TABS
0.2500 mg | ORAL_TABLET | Freq: Three times a day (TID) | ORAL | Status: DC | PRN
  Administered 2018-02-27 – 2018-03-02 (×4): 0.25 mg via ORAL
  Filled 2018-02-27 (×4): qty 1

## 2018-02-27 NOTE — Telephone Encounter (Signed)
Please advise if ok to refill last filled by Bensimhon.

## 2018-02-27 NOTE — Telephone Encounter (Signed)
Refill request for Metolazone.

## 2018-02-27 NOTE — Care Management Note (Signed)
Case Management Note  Patient Details  Name: Kelly Leonard MRN: [id number] Date of Birth: 09/10/1955  Subjective/Objective:  From home with wife, presents with acute/chronic syst chf with EF 25%, stage 4 CKD, had left AMA on 6/16 from [facility] (volume overlaod), now volume overload resistant to inotrope and diuresis, sent to ICU for CRRT.                  Action/Plan: NCM will follow for dc needs.   Expected Discharge Date:                  Expected Discharge Plan:     In-House Referral:     Discharge planning Services  CM Consult  Post Acute Care Choice:    Choice offered to:     DME Arranged:    DME Agency:     HH Arranged:    HH Agency:     Status of Service:  In process, will continue to follow  If discussed at Long Length of Stay Meetings, dates discussed:    Additional Comments:  Leone Haven, RN 02/27/2018, 9:49 AM

## 2018-02-27 NOTE — Progress Notes (Signed)
Dr. Juel Burrow with [facility] paged and made aware of repeat APPT result. Order received to restart heparin at 400 units in CRRT circuit and follow protocol via ACT's. Watch for bleeding. Will continue to monitor closely. Modena Jansky RN 2 Heart CV[facility]

## 2018-02-27 NOTE — Progress Notes (Signed)
[facility] Progress Note    Assessment/ Plan:   1. Acute systolic heart failure: he is clearly decompensated, EF 20-25%. Started empirically on dobut, Lasix, metolazone with little response.  Was transferred to ICU 6/19 and initiated CRRT after lengthy discussion with pt and wife.  Tolerating well, all 4K bath, heparin, ACTs OK, most recent APTT 146 6/20, needs daily (ordered).  Continue UF rate to 200 mL/ hr as tolerated.      2. CKD IV-V: progressive. Needs CRRT for vol removal; likely will end up ESRD--> ? If he will be able to tolerate IHD due to sig cardiomyopathy but remains to be seen.  Followed by Dr. Mosetta Pigeon in [place] but also has upcoming appt with Dr. Kathrene Bongo in our clinic as well.   3. HCV- per primary  4. Gout- per primary.  5.  Encephalopathy this AM- likely precedex and xanax- per primary  6.  Dispo: In ICU for CRRT   Subjective:    Increased fluid removal to 200 mL/ hr, tolerating.  Pretty sedated- precedex and xanax.   + Foley, retained 300 mL urine.   Objective:   BP (!) 122/96   Pulse 71   Temp (!) 97.4 F (36.3 C) (Rectal)   Resp 11   Ht 5\' 9"  (1.753 m)   Wt 63.9 kg (140 lb 14 oz)   SpO2 98%   BMI 20.80 kg/m   Intake/Output Summary (Last 24 hours) at 02/27/2018 0933 Last data filed at 02/27/2018 0900 Gross per 24 hour  Intake 493.21 ml  Output 5728 ml  Net -5234.79 ml   Weight change:   Physical Exam: GEN sleeping, somewhat arousable HEENT sclerae anicteric NECK +JVD, improved PULM normal WOB, bilateral crackles improved CV regular, II/VI systolic murmur, no S3 anymore ABD distension improved EXT 1+ pitting edema up the thighs, improved NEURO AAO x 3 SKIN warm and dry, no rashes or lesions   Imaging: Dg Chest Port 1 View  Result Date: 02/27/2018 CLINICAL DATA:  Shortness of breath EXAM: PORTABLE CHEST 1 VIEW COMPARISON:  02/26/2018 FINDINGS: Cardiac shadow remains enlarged. Defibrillator is again  noted. Right jugular temporary dialysis catheter is again seen. Bilateral pleural effusions and left retrocardiac infiltrate are again seen and stable. Degree of infiltrative change in the right base is stable as well. No pneumothorax is noted. IMPRESSION: Bilateral effusions and infiltrates stable from the previous exam. Electronically Signed   By: Alcide Clever M.D.   On: 02/27/2018 07:03   Dg Chest Port 1 View  Result Date: 02/26/2018 CLINICAL DATA:  SOB (shortness of breath). Pt unresponsive w/ respiration orders, image taken on inhalation. EXAM: PORTABLE CHEST 1 VIEW COMPARISON:  02/26/2018 FINDINGS: RIGHT IJ central line tip overlies the superior vena cava. Patient has a LEFT-sided transvenous pacemaker with leads to the RIGHT ventricle. The heart is enlarged. There are bilateral pleural effusions. Bibasilar opacities obscure the LEFT hemidiaphragm and a portion of the RIGHT hemidiaphragm and appear stable. No pneumothorax. IMPRESSION: 1. Stable cardiomegaly. 2. Stable bibasilar opacities and pleural effusions. Electronically Signed   By: Norva Pavlov M.D.   On: 02/26/2018 18:31   Dg Chest Port 1 View  Result Date: 02/26/2018 CLINICAL DATA:  Shortness of breath. EXAM: PORTABLE CHEST 1 VIEW COMPARISON:  Radiograph February 25, 2018. FINDINGS: Stable cardiomegaly and central pulmonary vascular congestion. Single lead left-sided pacemaker is unchanged in position. Right internal jugular catheter is unchanged. No pneumothorax is noted. Stable left basilar atelectasis or infiltrate is noted with probable small  pleural effusion. Increased right basilar opacity is noted concerning for worsening edema or atelectasis with associated pleural effusion. Bony thorax is unremarkable. IMPRESSION: Stable cardiomegaly and central pulmonary vascular congestion. Stable left basilar atelectasis or infiltrate is noted with probable small pleural effusion. Increased right basilar opacity is noted concerning for worsening  edema or atelectasis with associated pleural effusion. Electronically Signed   By: Lupita Raider, M.D.   On: 02/26/2018 08:37   Dg Chest Port 1 View  Result Date: 02/25/2018 CLINICAL DATA:  Shortness of breath. EXAM: PORTABLE CHEST 1 VIEW COMPARISON:  Radiographs of February 23, 2018. FINDINGS: Stable cardiomegaly. Single lead left-sided pacemaker is unchanged in position. No pneumothorax is noted. Right internal jugular venous sheath is noted. Stable left basilar atelectasis or infiltrate is noted with associated pleural effusion. Mildly increased right infrahilar atelectasis or infiltrate is noted. Bony thorax is unremarkable. IMPRESSION: Stable left basilar atelectasis or infiltrate is noted with associated pleural effusion. Increased right infrahilar atelectasis or infiltrate is Electronically Signed   By: Lupita Raider, M.D.   On: 02/25/2018 15:38    Labs: BMET Recent Labs  Lab 02/21/18 0551  02/23/18 1810 02/24/18 0426 02/25/18 0449 02/25/18 1841 02/26/18 0200 02/26/18 2229 02/27/18 0524  NA 133*   < > 138 135 138 138 137 139 137  K 4.0   < > 4.0 3.4* 4.6 3.9 4.2 3.6 4.4  CL 97*   < > 96* 97* 98* 98* 98* 108 104  CO2 23   < > 31 26 29 25 22 23 23   GLUCOSE 119*   < > 109* 106* 102* 119* 116* 81 85  BUN 88*   < > 80* 80* 88* 80* 70* 38* 40*  CREATININE 3.58*   < > 3.31* 3.23* 3.42* 3.09* 2.76* 1.88* 2.04*  CALCIUM 8.3*   < > 8.5* 8.3* 8.9 8.7* 8.9 7.3* 8.6*  PHOS 5.4*  --   --   --   --  3.7 3.4 2.4* 3.0   < > = values in this interval not displayed.   CBC Recent Labs  Lab 02/23/18 1810 02/25/18 1345 02/26/18 0200 02/27/18 0524  WBC 6.4 6.1 6.9 7.0  NEUTROABS  --   --  5.1 5.0  HGB 11.2* 11.5* 12.0* 10.9*  HCT 38.4* 38.9* 40.7 36.7*  MCV 75.3* 75.0* 75.9* 73.5*  PLT 259 260 260 225    Medications:    . Chlorhexidine Gluconate Cloth  6 each Topical Daily  . heparin  5,000 Units Subcutaneous Q8H  . mouth rinse  15 mL Mouth Rinse BID  . OLANZapine  2.5 mg Oral QHS   . THROMBI-PAD  1 each Topical Once      Bufford Buttner MD 02/27/2018, 9:33 AM

## 2018-02-27 NOTE — Progress Notes (Signed)
Still very lethargic  Will ck ABG  Simonne Martinet ACNP-BC [facility] Pulmonary/Critical Care Pager # [phone] OR # [phone] if no answer

## 2018-02-27 NOTE — Progress Notes (Signed)
PULMONARY / CRITICAL CARE MEDICINE   Name: Kelly Leonard MRN: [id number] DOB: 1956-01-17    ADMISSION DATE:  02/23/2018 CONSULTATION DATE: 6/19  REFERRING MD:  Maryfrances Bunnell  CHIEF COMPLAINT:  Cardiorenal syndrome and need for CRRT  HISTORY OF PRESENT ILLNESS:   This is a 62 year old male patient with a history of nonischemic cardiomyopathy, EF 25% further complicated by stage IV chronic kidney disease due to polycystic kidney with a baseline serum creatinine of 2.8-3, even further complicated by hypertension.  He has an ICD.  Admitted on 6/18 with chief complaint Of worsening shortness of breath, progressive lower extremity edema, and scrotal and abdominal swelling for approximately 1 week.  He had just left AGAINST MEDICAL ADVICE on 6/16 after hospitalization at [facility] for volume overload.  During that hospitalization he required dobutamine infusion in addition to Lasix drip.  Since admission to [facility] therapeutic interventions have included supplemental oxygen, heart failure and nephrology consultation, aggressive IV diuresis, and inotropic support.  At time of critical care consult he is -506 mL; and has had really minimal response to high dosing IV Lasix.  We have been asked to transfer him to the intensive care where he can initiate CRRT for more aggressive volume removal.    SUBJECTIVE:  More sedated today VITAL SIGNS: Blood Pressure (Abnormal) 122/93   Pulse 71   Temperature (Abnormal) 96.9 F (36.1 C) (Axillary)   Respiration 13   Height 5\' 9"  (1.753 m)   Weight 143 lb 11.8 oz (65.2 kg)   Oxygen Saturation 100%   Body Mass Index 21.23 kg/m   HEMODYNAMICS: CVP:  [15 mmHg-24 mmHg] 15 mmHg  VENTILATOR SETTINGS:    INTAKE / OUTPUT: I/O last 3 completed shifts: In: 770.4 [P.O.:240; I.V.:265.8; Other:22; IV Piggyback:242.6] Out: 9246 [Urine:585; Other:8661]  PHYSICAL EXAMINATION: General: This is a chronically ill-appearing 62 year old male patient currently  more lethargic today than on previous exams. HEENT normocephalic mucous membranes dry still has some jugular venous distention Pulmonary: Diminished right greater than left base no accessory use respiratory rate 10 to 12 breaths/min currently Cardiac: Regular rate and rhythm systolic murmur appreciated Extremities: Warm and dry lower extremity edema Abdomen: Soft nontender no organomegaly Neuro: Currently sedated, will wake up, but speech slurred.  Precedex infusion just discontinued. LABS:  BMET Recent Labs  Lab 02/26/18 0200 02/26/18 2229 02/27/18 0524  NA 137 139 137  K 4.2 3.6 4.4  CL 98* 108 104  CO2 22 23 23   BUN 70* 38* 40*  CREATININE 2.76* 1.88* 2.04*  GLUCOSE 116* 81 85    Electrolytes Recent Labs  Lab 02/24/18 0426  02/26/18 0200 02/26/18 2229 02/27/18 0524  CALCIUM 8.3*   < > 8.9 7.3* 8.6*  MG 2.1  --  2.2  --  2.3  PHOS  --    < > 3.4 2.4* 3.0   < > = values in this interval not displayed.    CBC Recent Labs  Lab 02/25/18 1345 02/26/18 0200 02/27/18 0524  WBC 6.1 6.9 7.0  HGB 11.5* 12.0* 10.9*  HCT 38.9* 40.7 36.7*  PLT 260 260 225    Coag's Recent Labs  Lab 02/26/18 0200 02/27/18 0524  APTT 146*  --   INR 1.27 1.54    Sepsis Markers No results for input(s): LATICACIDVEN, PROCALCITON, O2SATVEN in the last 168 hours.  ABG Recent Labs  Lab 02/26/18 1200 02/26/18 1927  PHART 7.474* 7.561*  PCO2ART 34.6 23.7*  PO2ART 64.6* 113.0*    Liver Enzymes Recent Labs  Lab 02/23/18 1810  02/26/18 0200 02/26/18 2229 02/27/18 0524  AST 34  --   --   --   --   ALT 22  --   --   --   --   ALKPHOS 99  --   --   --   --   BILITOT 1.2  --   --   --   --   ALBUMIN 2.9*   < > 3.3* 2.5* 2.9*   < > = values in this interval not displayed.    Cardiac Enzymes No results for input(s): TROPONINI, PROBNP in the last 168 hours.  Glucose No results for input(s): GLUCAP in the last 168 hours.  Imaging Dg Chest Port 1 View  Result Date:  02/27/2018 CLINICAL DATA:  Shortness of breath EXAM: PORTABLE CHEST 1 VIEW COMPARISON:  02/26/2018 FINDINGS: Cardiac shadow remains enlarged. Defibrillator is again noted. Right jugular temporary dialysis catheter is again seen. Bilateral pleural effusions and left retrocardiac infiltrate are again seen and stable. Degree of infiltrative change in the right base is stable as well. No pneumothorax is noted. IMPRESSION: Bilateral effusions and infiltrates stable from the previous exam. Electronically Signed   By: Alcide Clever M.D.   On: 02/27/2018 07:03   Dg Chest Port 1 View  Result Date: 02/26/2018 CLINICAL DATA:  SOB (shortness of breath). Pt unresponsive w/ respiration orders, image taken on inhalation. EXAM: PORTABLE CHEST 1 VIEW COMPARISON:  02/26/2018 FINDINGS: RIGHT IJ central line tip overlies the superior vena cava. Patient has a LEFT-sided transvenous pacemaker with leads to the RIGHT ventricle. The heart is enlarged. There are bilateral pleural effusions. Bibasilar opacities obscure the LEFT hemidiaphragm and a portion of the RIGHT hemidiaphragm and appear stable. No pneumothorax. IMPRESSION: 1. Stable cardiomegaly. 2. Stable bibasilar opacities and pleural effusions. Electronically Signed   By: Norva Pavlov M.D.   On: 02/26/2018 18:31     STUDIES:  Echocardiogram obtained on 6/19:EF 20 to 25% the cavity size was mildly dilated.  Severe diffuse hypokinesis the left atrium was severely dilated the right ventricle was severely dilated the right atrium was severely dilated.  His estimated peak arterial systolic pressure was 45 mmHg  CULTURES:   ANTIBIOTICS:   SIGNIFICANT EVENTS:   LINES/TUBES:   DISCUSSION: 62 year old male patient with progressive nonischemic cardiomyopathy and resultant heart failure as well as volume overload.  This is been resistant to inotrope and diuresis.  Moving to the intensive care now for CRRT  ASSESSMENT / PLAN:  Acute on chronic systolic heart  failure/HFrEF EF estimated 20 to 25%; in the setting of nonischemic cardiomyopathy Plan Volume removal with CRRT Continue inotropic support Holding beta-blockade due to low cardiac output Additional recommendations per cardiology  Hypoxia in the setting of heart failure and progressive pulmonary edema Portable chest x-ray personally reviewed.  This continues to demonstrate cardiomegaly.  Bilateral edema, basilar atelectasis, and right sided effusion He is now off oxygen Respiratory rate 10-12 on a.m. assessment 6/21.  He seems oversedated.  Precedex infusion has been discontinued temporarily Plan We will clinically reevaluate following cessation of Precedex If remains obtunded we will get ABG We will look at right chest with ultrasound and consider thoracentesis Wean oxygen  Sedation: Appears oversedated the a.m. of 6/21. Plan Stop Precedex Decrease Xanax dosing May need to check arterial blood gas  Acute on chronic renal failure; stage IV chronic renal disease due to polycystic renal disease.  His baseline serum creatinine is 2.8-3 -Nephrology following -He may have progressed  to end-stage renal disease, unclear at this point -Remains volume overloaded, finally making progress in regards to negative volume status goals Plan Continue CRRT per nephrology   Anemia of chronic disease Hemoglobin drift from 12 down to 10.9.  Suspect this is secondary to hemolysis on dialysis Plan Trend CBC Continue subcutaneous heparin  History of gout. allergic to allopurinol, prednisone, and uloric  No current evidence of flare Plan Cont supportive care   Hepatitis C. Plan Resume Mavyret as out-pt    DVT prophylaxis: [place] heparin  SUP: NA  Diet: renal Activity: BR Disposition : ICU   FAMILY  - Updates:   - Inter-disciplinary family meet or Palliative Care meeting due by:  6/26  Simonne Martinet ACNP-BC [facility] Pulmonary/Critical Care Pager # [phone] OR # [phone] if no  answer     02/27/2018, 9:07 AM

## 2018-02-27 NOTE — Progress Notes (Addendum)
Advanced Heart Failure Rounding Note  PCP-Cardiologist: Arvilla Meres, MD   Subjective:    Moved to ICU 02/25/18 for initiation of CRRT.  Coox 35.6 -> 42% this am. Tolerating 200-250 cc an hr from CRRT. CVP 13-14  Was on precedex overnight for agitation. Sedated this am.   Objective:   Weight Range: 143 lb 11.8 oz (65.2 kg) Body mass index is 21.23 kg/m.   Vital Signs:   Temp:  [96.9 F (36.1 C)-98.1 F (36.7 C)] 96.9 F (36.1 C) (06/21 0810) Pulse Rate:  [70-111] 71 (06/21 0700) Resp:  [8-36] 13 (06/21 0700) BP: (107-137)/(77-107) 122/93 (06/21 0700) SpO2:  [86 %-100 %] 100 % (06/21 0700) Last BM Date: 02/24/18  Weight change: Filed Weights   02/24/18 0447 02/25/18 0500 02/26/18 0500  Weight: 148 lb 12.8 oz (67.5 kg) 147 lb 12.8 oz (67 kg) 143 lb 11.8 oz (65.2 kg)    Intake/Output:   Intake/Output Summary (Last 24 hours) at 02/27/2018 0856 Last data filed at 02/27/2018 0837 Gross per 24 hour  Intake 432.79 ml  Output 5663 ml  Net -5230.21 ml      Physical Exam    General: Sedated. NAD.  HEENT: Normal Neck: Supple. JVP to jaw. Carotids 2+ bilat; no bruits. No thyromegaly or nodule noted. Cor: PMI nondisplaced. RRR, No M/G/R noted Lungs: Diminished.  Abdomen: Soft, non-tender, non-distended, no HSM. No bruits or masses. +BS  Extremities: No cyanosis, clubbing, or rash. 2+ dependent edema in thighs.  Neuro: Sedated.   Telemetry   NSR 70-80s, personally reviewed.   EKG    No new tracings.    Labs    CBC Recent Labs    02/26/18 0200 02/27/18 0524  WBC 6.9 7.0  NEUTROABS 5.1 5.0  HGB 12.0* 10.9*  HCT 40.7 36.7*  MCV 75.9* 73.5*  PLT 260 225   Basic Metabolic Panel Recent Labs    16/10/96 0200 02/26/18 2229 02/27/18 0524  NA 137 139 137  K 4.2 3.6 4.4  CL 98* 108 104  CO2 22 23 23   GLUCOSE 116* 81 85  BUN 70* 38* 40*  CREATININE 2.76* 1.88* 2.04*  CALCIUM 8.9 7.3* 8.6*  MG 2.2  --  2.3  PHOS 3.4 2.4* 3.0   Liver  Function Tests Recent Labs    02/26/18 2229 02/27/18 0524  ALBUMIN 2.5* 2.9*   No results for input(s): LIPASE, AMYLASE in the last 72 hours. Cardiac Enzymes Recent Labs    02/26/18 0200 02/27/18 0524  CKTOTAL 113 108    BNP: BNP (last 3 results) Recent Labs    02/22/18 0442 02/23/18 1810 02/26/18 0214  BNP 3,404.0* >4,500.0* 2,861.5*    ProBNP (last 3 results) No results for input(s): PROBNP in the last 8760 hours.   D-Dimer No results for input(s): DDIMER in the last 72 hours. Hemoglobin A1C No results for input(s): HGBA1C in the last 72 hours. Fasting Lipid Panel No results for input(s): CHOL, HDL, LDLCALC, TRIG, CHOLHDL, LDLDIRECT in the last 72 hours. Thyroid Function Tests No results for input(s): TSH, T4TOTAL, T3FREE, THYROIDAB in the last 72 hours.  Invalid input(s): FREET3  Other results:   Imaging    Dg Chest Port 1 View  Result Date: 02/27/2018 CLINICAL DATA:  Shortness of breath EXAM: PORTABLE CHEST 1 VIEW COMPARISON:  02/26/2018 FINDINGS: Cardiac shadow remains enlarged. Defibrillator is again noted. Right jugular temporary dialysis catheter is again seen. Bilateral pleural effusions and left retrocardiac infiltrate are again seen and stable. Degree of  infiltrative change in the right base is stable as well. No pneumothorax is noted. IMPRESSION: Bilateral effusions and infiltrates stable from the previous exam. Electronically Signed   By: Alcide Clever M.D.   On: 02/27/2018 07:03   Dg Chest Port 1 View  Result Date: 02/26/2018 CLINICAL DATA:  SOB (shortness of breath). Pt unresponsive w/ respiration orders, image taken on inhalation. EXAM: PORTABLE CHEST 1 VIEW COMPARISON:  02/26/2018 FINDINGS: RIGHT IJ central line tip overlies the superior vena cava. Patient has a LEFT-sided transvenous pacemaker with leads to the RIGHT ventricle. The heart is enlarged. There are bilateral pleural effusions. Bibasilar opacities obscure the LEFT hemidiaphragm and a  portion of the RIGHT hemidiaphragm and appear stable. No pneumothorax. IMPRESSION: 1. Stable cardiomegaly. 2. Stable bibasilar opacities and pleural effusions. Electronically Signed   By: Norva Pavlov M.D.   On: 02/26/2018 18:31     Medications:     Scheduled Medications: . Chlorhexidine Gluconate Cloth  6 each Topical Daily  . heparin  5,000 Units Subcutaneous Q8H  . mouth rinse  15 mL Mouth Rinse BID  . OLANZapine  2.5 mg Oral QHS    Infusions: . dexmedetomidine (PRECEDEX) IV infusion Stopped (02/27/18 0832)  . DOBUTamine 2.5 mcg/kg/min (02/27/18 0837)  . heparin 10,000 units/ 20 mL infusion syringe 700 Units/hr (02/27/18 0808)  . dialysis replacement fluid (prismasate) 500 mL/hr at 02/27/18 0331  . dialysis replacement fluid (prismasate) 200 mL/hr at 02/26/18 1710  . dialysate (PRISMASATE) 1,000 mL/hr at 02/27/18 0839  . sodium chloride      PRN Medications: ALPRAZolam, heparin, ondansetron **OR** ondansetron (ZOFRAN) IV, sodium chloride, sodium chloride flush    Patient Profile   Kelly Leonard is a 62 y.o. 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   Admitted 02/24/18 with A/C systolic CHF.   Assessment/Plan   1. A/C Systolic HF, NICM.  - Echo 06/2017 EF 25% - Volume status remains elevated - CRRT pulling 200-250 ml/hr.  - Coox 42% this am (repeat from 36%) on Dobutamine 2.5 mcg/kg/min.  -No bb with component of low output.  -Allergicto entresto, carvedilol, torsemide, and ACE -Bidil on hold. Leave room for diuresis and renal perfusion.   2. AKI on CKD Stage IV -has severePolycystic Kidney Disease - Baseline Cr thought to be 3.6-3.8. - Continue CRRT. Pt and wife have been very hesitant to consider HD. May not be candidate with severe CHF.   3. HCV -He has been followed by ID and has been onmavyret per ID - No change to current plan.     4. Gout -Allergic to allopurinol,prednisone, and uloric. - Per primary.   5. Hypokalemia - K 4.4. Per renal.    6. Prognosis - Now in CRRT. It is unclear if he would tolerate HD. Will have to watch for renal recovery for prognosis. Prognosis increasingly concerning.   7. Agitation - Given xanax overnight and then started on precedex - Sedated this am.   Medication concerns reviewed with patient and pharmacy team. Barriers identified: Compliance with medical advice.   Length of Stay: 3  Deane, [place]  02/27/2018, 8:56 AM  Advanced Heart Failure Team Pager [phone] (M-F; 7a - 4p)  Please contact [facility] for night-coverage after hours (4p -7a ) and weekends on [email or web address]  Agree.  Sedated on precedex but arousable. Remains on CVVHD pulling 200-250/hr. CVP much improved. Remains don dobutamine 2.5. Co-ox very low.  Minimal urine output.  Weak appearing sedated JVP to jaw RIJ trialysis with oozing Cor RRR +s3 Lungs clear Ab soft NT Ext cachetic no edema  Very difficult situation. Volume status improved with CVVHD but still a bit to go. Main issue is that co-ox is very low despite dobutamine and I doubt his heart will be strong enough to tolerate iHD without inotropic support. We will see how it goes. He is not candidate for hear kidney transplant.  CRITICAL CARE Performed by: Arvilla Meres  Total critical care time: 35 minutes  Critical care time was exclusive of separately billable procedures and treating other patients.  Critical care was necessary to treat or prevent imminent or life-threatening deterioration.  Critical care was time spent personally by me (independent of midlevel providers or residents) on the following activities: development of treatment plan with patient and/or surrogate as well as nursing, discussions with consultants, evaluation of patient's response to treatment, examination of patient, obtaining history from  patient or surrogate, ordering and performing treatments and interventions, ordering and review of laboratory studies, ordering and review of radiographic studies, pulse oximetry and re-evaluation of patient's condition.  Arvilla Meres, MD  10:48 AM

## 2018-02-27 NOTE — Telephone Encounter (Signed)
Would forward to CHF clinic to advise as they saw him last and he is currently admitted.  Thanks!

## 2018-02-27 NOTE — Progress Notes (Signed)
RN paged Heart Failure MD on call and reported drop in  Coox for this AM. No new orders have been received.   Patient stable. Will continue to monitor.   Bastian Andreoli E Mylinda Latina, [place]

## 2018-02-28 ENCOUNTER — Inpatient Hospital Stay (HOSPITAL_COMMUNITY): Payer: BLUE CROSS/BLUE SHIELD

## 2018-02-28 DIAGNOSIS — N179 Acute kidney failure, unspecified: Secondary | ICD-10-CM

## 2018-02-28 LAB — RENAL FUNCTION PANEL
ALBUMIN: 3.2 g/dL — AB (ref 3.5–5.0)
Albumin: 2.7 g/dL — ABNORMAL LOW (ref 3.5–5.0)
Anion gap: 12 (ref 5–15)
Anion gap: 10 (ref 5–15)
BUN: 25 mg/dL — AB (ref 6–20)
BUN: 25 mg/dL — AB (ref 6–20)
CHLORIDE: 103 mmol/L (ref 101–111)
CO2: 22 mmol/L (ref 22–32)
CO2: 24 mmol/L (ref 22–32)
CREATININE: 1.71 mg/dL — AB (ref 0.61–1.24)
CREATININE: 2.11 mg/dL — AB (ref 0.61–1.24)
Calcium: 7.9 mg/dL — ABNORMAL LOW (ref 8.9–10.3)
Calcium: 8.8 mg/dL — ABNORMAL LOW (ref 8.9–10.3)
Chloride: 100 mmol/L — ABNORMAL LOW (ref 101–111)
GFR calc Af Amer: 37 mL/min — ABNORMAL LOW (ref >60)
GFR, EST AFRICAN AMERICAN: 48 mL/min — AB (ref >60)
GFR, EST NON AFRICAN AMERICAN: 41 mL/min — AB (ref >60)
GFR, EST NON AFRICAN AMERICAN: 32 mL/min — AB (ref >60)
GLUCOSE: 164 mg/dL — AB (ref 65–99)
Glucose, Bld: 111 mg/dL — ABNORMAL HIGH (ref 65–99)
PHOSPHORUS: 1.9 mg/dL — AB (ref 2.5–4.6)
POTASSIUM: 3.8 mmol/L (ref 3.5–5.1)
POTASSIUM: 4.3 mmol/L (ref 3.5–5.1)
Phosphorus: 1.8 mg/dL — ABNORMAL LOW (ref 2.5–4.6)
Sodium: 137 mmol/L (ref 135–145)
Sodium: 134 mmol/L — ABNORMAL LOW (ref 135–145)

## 2018-02-28 LAB — POCT ACTIVATED CLOTTING TIME
ACTIVATED CLOTTING TIME: 109 s
ACTIVATED CLOTTING TIME: 186 s
ACTIVATED CLOTTING TIME: 197 s
ACTIVATED CLOTTING TIME: 197 s
ACTIVATED CLOTTING TIME: 197 s
Activated Clotting Time: 175 seconds
Activated Clotting Time: 186 seconds
Activated Clotting Time: 202 seconds
Activated Clotting Time: 208 seconds
Activated Clotting Time: 202 seconds
Activated Clotting Time: 202 seconds
Activated Clotting Time: 197 seconds
Activated Clotting Time: 202 seconds

## 2018-02-28 LAB — CBC WITH DIFFERENTIAL/PLATELET
BASOS ABS: 0.1 10*3/uL (ref 0.0–0.1)
Basophils Relative: 1 %
EOS ABS: 0.2 10*3/uL (ref 0.0–0.7)
Eosinophils Relative: 2 %
HCT: 36.8 % — ABNORMAL LOW (ref 39.0–52.0)
Hemoglobin: 10.7 g/dL — ABNORMAL LOW (ref 13.0–17.0)
LYMPHS ABS: 1.4 10*3/uL (ref 0.7–4.0)
Lymphocytes Relative: 15 %
MCH: 21.9 pg — ABNORMAL LOW (ref 26.0–34.0)
MCHC: 29.1 g/dL — AB (ref 30.0–36.0)
MCV: 75.4 fL — ABNORMAL LOW (ref 78.0–100.0)
MONOS PCT: 11 %
Monocytes Absolute: 1 10*3/uL (ref 0.1–1.0)
NEUTROS ABS: 6.4 10*3/uL (ref 1.7–7.7)
Neutrophils Relative %: 71 %
Platelets: 247 10*3/uL (ref 150–400)
RBC: 4.88 MIL/uL (ref 4.22–5.81)
RDW: 27.9 % — AB (ref 11.5–15.5)
WBC: 9.1 10*3/uL (ref 4.0–10.5)

## 2018-02-28 LAB — COOXEMETRY PANEL
CARBOXYHEMOGLOBIN: 1.8 % — AB (ref 0.5–1.5)
METHEMOGLOBIN: 0.9 % (ref 0.0–1.5)
O2 Saturation: 52.8 %
TOTAL HEMOGLOBIN: 12 g/dL (ref 12.0–16.0)

## 2018-02-28 LAB — MAGNESIUM: MAGNESIUM: 2.1 mg/dL (ref 1.7–2.4)

## 2018-02-28 LAB — APTT: APTT: 175 s — AB (ref 24–36)

## 2018-02-28 NOTE — Progress Notes (Signed)
[facility] Progress Note    Assessment/ Plan:   1. Acute systolic heart failure: he is clearly decompensated, EF 20-25%. Started empirically on dobut, Lasix, metolazone with little response.  Was transferred to ICU 6/19 and initiated CRRT after lengthy discussion with pt and wife.  Tolerating well, all 4K bath, heparin, ACTs OK, correlating with APTTs.  Continue UF rate to 200 mL/ hr as tolerated, expect to back down on UF rate tomorrow as hemodynamics improving     2. CKD IV-V: progressive. Needs CRRT for vol removal; likely will end up ESRD--> ? If he will be able to tolerate IHD due to sig cardiomyopathy but remains to be seen.  Followed by Dr. Mosetta Pigeon in [place] but also has upcoming appt with Dr. Kathrene Bongo in our clinic as well.   3. HCV- per primary  4. Gout- per primary.  5.  Encephalopathy: improved- likely precedex and xanax- per primary  6.  Dispo: In ICU for CRRT   Subjective:    Feeling better today, sitting up in chair, wife at bedside.  CVP 8.   Objective:   BP (!) 125/97 (BP Location: Left Arm)   Pulse 99   Temp 98 F (36.7 C) (Oral)   Resp 18   Ht 5\' 9"  (1.753 m)   Wt 60.4 kg (133 lb 2.5 oz)   SpO2 98%   BMI 19.66 kg/m   Intake/Output Summary (Last 24 hours) at 02/28/2018 0931 Last data filed at 02/28/2018 0800 Gross per 24 hour  Intake 537.75 ml  Output 5313 ml  Net -4775.25 ml   Weight change:   Physical Exam: GEN sleeping, somewhat arousable HEENT sclerae anicteric NECK no JVD today PULM normal WOB, bilateral crackles improved, much more comfortable CV regular, II/VI systolic murmur, no S3 anymore ABD distension much improved EXT no pitting edema anymore NEURO AAO x 3 SKIN warm and dry, no rashes or lesions   Imaging: Dg Chest Port 1 View  Result Date: 02/28/2018 CLINICAL DATA:  Pleural effusion. EXAM: PORTABLE CHEST 1 VIEW COMPARISON:  02/27/2018 FINDINGS: Right jugular catheter terminates over the  mid SVC. An ICD remains in place. The cardiac silhouette remains enlarged. Bilateral pleural effusions and asymmetric left basilar airspace opacity are unchanged. Milder right basilar opacities are stable to slightly improved. No pneumothorax is identified. IMPRESSION: 1. Cardiomegaly and mild pulmonary vascular congestion with persistent pleural effusions and asymmetric left basilar opacity which may reflect atelectasis or pneumonia. 2. Perhaps slight improved aeration of the right lung base. Electronically Signed   By: Sebastian Ache M.D.   On: 02/28/2018 07:36   Dg Chest Port 1 View  Result Date: 02/27/2018 CLINICAL DATA:  Shortness of breath EXAM: PORTABLE CHEST 1 VIEW COMPARISON:  02/26/2018 FINDINGS: Cardiac shadow remains enlarged. Defibrillator is again noted. Right jugular temporary dialysis catheter is again seen. Bilateral pleural effusions and left retrocardiac infiltrate are again seen and stable. Degree of infiltrative change in the right base is stable as well. No pneumothorax is noted. IMPRESSION: Bilateral effusions and infiltrates stable from the previous exam. Electronically Signed   By: Alcide Clever M.D.   On: 02/27/2018 07:03   Dg Chest Port 1 View  Result Date: 02/26/2018 CLINICAL DATA:  SOB (shortness of breath). Pt unresponsive w/ respiration orders, image taken on inhalation. EXAM: PORTABLE CHEST 1 VIEW COMPARISON:  02/26/2018 FINDINGS: RIGHT IJ central line tip overlies the superior vena cava. Patient has a LEFT-sided transvenous pacemaker with leads to the RIGHT ventricle. The heart is  enlarged. There are bilateral pleural effusions. Bibasilar opacities obscure the LEFT hemidiaphragm and a portion of the RIGHT hemidiaphragm and appear stable. No pneumothorax. IMPRESSION: 1. Stable cardiomegaly. 2. Stable bibasilar opacities and pleural effusions. Electronically Signed   By: Norva Pavlov M.D.   On: 02/26/2018 18:31    Labs: BMET Recent Labs  Lab 02/25/18 0449 02/25/18 1841  02/26/18 0200 02/26/18 2229 02/27/18 0524 02/27/18 1600 02/28/18 0416  NA 138 138 137 139 137 138 137  K 4.6 3.9 4.2 3.6 4.4 3.9 3.8  CL 98* 98* 98* 108 104 102 103  CO2 29 25 22 23 23 26 22   GLUCOSE 102* 119* 116* 81 85 117* 111*  BUN 88* 80* 70* 38* 40* 32* 25*  CREATININE 3.42* 3.09* 2.76* 1.88* 2.04* 1.75* 1.71*  CALCIUM 8.9 8.7* 8.9 7.3* 8.6* 8.8* 7.9*  PHOS  --  3.7 3.4 2.4* 3.0 2.4* 1.8*   CBC Recent Labs  Lab 02/25/18 1345 02/26/18 0200 02/27/18 0524 02/28/18 0416  WBC 6.1 6.9 7.0 9.1  NEUTROABS  --  5.1 5.0 6.4  HGB 11.5* 12.0* 10.9* 10.7*  HCT 38.9* 40.7 36.7* 36.8*  MCV 75.0* 75.9* 73.5* 75.4*  PLT 260 260 225 247    Medications:    . Chlorhexidine Gluconate Cloth  6 each Topical Daily  . heparin  5,000 Units Subcutaneous Q8H  . mouth rinse  15 mL Mouth Rinse BID  . OLANZapine  2.5 mg Oral QHS      Bufford Buttner MD 02/28/2018, 9:31 AM

## 2018-02-28 NOTE — Progress Notes (Signed)
Patient ID: Kelly Leonard, male   DOB: 09-23-1955, 62 y.o.   MRN: [id number]     Advanced Heart Failure Rounding Note  PCP-Cardiologist: Arvilla Meres, MD   Subjective:    Moved to ICU 02/25/18 for initiation of CRRT.  Co-ox better today at 53% but still not great. Tolerating 200 cc/hr from CVVH. CVP 19 but in chair.   Cough improving, denies dyspnea.   Objective:   Weight Range: 133 lb 2.5 oz (60.4 kg) Body mass index is 19.66 kg/m.   Vital Signs:   Temp:  [96.1 F (35.6 C)-98.1 F (36.7 C)] 98 F (36.7 C) (06/22 0400) Pulse Rate:  [71-106] 99 (06/22 0800) Resp:  [9-32] 18 (06/22 0800) BP: (100-137)/(73-112) 125/97 (06/22 0800) SpO2:  [88 %-100 %] 98 % (06/22 0800) Weight:  [133 lb 2.5 oz (60.4 kg)-140 lb 14 oz (63.9 kg)] 133 lb 2.5 oz (60.4 kg) (06/22 0500) Last BM Date: 02/24/18  Weight change: Filed Weights   02/26/18 0500 02/27/18 0900 02/28/18 0500  Weight: 143 lb 11.8 oz (65.2 kg) 140 lb 14 oz (63.9 kg) 133 lb 2.5 oz (60.4 kg)    Intake/Output:   Intake/Output Summary (Last 24 hours) at 02/28/2018 0855 Last data filed at 02/28/2018 0700 Gross per 24 hour  Intake 595.64 ml  Output 5635 ml  Net -5039.36 ml      Physical Exam    General: NAD Neck: JVP difficult, no thyromegaly or thyroid nodule.  Lungs: Clear to auscultation bilaterally with normal respiratory effort. CV: Lateral PMI.  Heart regular S1/S2, no S3/S4, no murmur.  No peripheral edema.   Abdomen: Soft, nontender, no hepatosplenomegaly, no distention.  Skin: Intact without lesions or rashes.  Neurologic: Alert and oriented x 3.  Psych: Normal affect. Extremities: No clubbing or cyanosis.  HEENT: Normal.    Telemetry   NSR 70s, personally reviewed.   EKG    No new tracings.    Labs    CBC Recent Labs    02/27/18 0524 02/28/18 0416  WBC 7.0 9.1  NEUTROABS 5.0 6.4  HGB 10.9* 10.7*  HCT 36.7* 36.8*  MCV 73.5* 75.4*  PLT 225 247   Basic Metabolic Panel Recent Labs   02/27/18 0524 02/27/18 1600 02/28/18 0416  NA 137 138 137  K 4.4 3.9 3.8  CL 104 102 103  CO2 23 26 22   GLUCOSE 85 117* 111*  BUN 40* 32* 25*  CREATININE 2.04* 1.75* 1.71*  CALCIUM 8.6* 8.8* 7.9*  MG 2.3  --  2.1  PHOS 3.0 2.4* 1.8*   Liver Function Tests Recent Labs    02/27/18 1600 02/28/18 0416  ALBUMIN 2.9* 2.7*   No results for input(s): LIPASE, AMYLASE in the last 72 hours. Cardiac Enzymes Recent Labs    02/26/18 0200 02/27/18 0524  CKTOTAL 113 108    BNP: BNP (last 3 results) Recent Labs    02/22/18 0442 02/23/18 1810 02/26/18 0214  BNP 3,404.0* >4,500.0* 2,861.5*    ProBNP (last 3 results) No results for input(s): PROBNP in the last 8760 hours.   D-Dimer No results for input(s): DDIMER in the last 72 hours. Hemoglobin A1C No results for input(s): HGBA1C in the last 72 hours. Fasting Lipid Panel No results for input(s): CHOL, HDL, LDLCALC, TRIG, CHOLHDL, LDLDIRECT in the last 72 hours. Thyroid Function Tests No results for input(s): TSH, T4TOTAL, T3FREE, THYROIDAB in the last 72 hours.  Invalid input(s): FREET3  Other results:   Imaging    Dg Chest Port 1  View  Result Date: 02/28/2018 CLINICAL DATA:  Pleural effusion. EXAM: PORTABLE CHEST 1 VIEW COMPARISON:  02/27/2018 FINDINGS: Right jugular catheter terminates over the mid SVC. An ICD remains in place. The cardiac silhouette remains enlarged. Bilateral pleural effusions and asymmetric left basilar airspace opacity are unchanged. Milder right basilar opacities are stable to slightly improved. No pneumothorax is identified. IMPRESSION: 1. Cardiomegaly and mild pulmonary vascular congestion with persistent pleural effusions and asymmetric left basilar opacity which may reflect atelectasis or pneumonia. 2. Perhaps slight improved aeration of the right lung base. Electronically Signed   By: Sebastian Ache M.D.   On: 02/28/2018 07:36     Medications:     Scheduled Medications: . Chlorhexidine  Gluconate Cloth  6 each Topical Daily  . heparin  5,000 Units Subcutaneous Q8H  . mouth rinse  15 mL Mouth Rinse BID  . OLANZapine  2.5 mg Oral QHS    Infusions: . DOBUTamine 2.5 mcg/kg/min (02/28/18 0600)  . heparin 10,000 units/ 20 mL infusion syringe 1,150 Units/hr (02/28/18 0407)  . dialysis replacement fluid (prismasate) 500 mL/hr at 02/28/18 0016  . dialysis replacement fluid (prismasate) 200 mL/hr at 02/27/18 1856  . dialysate (PRISMASATE) 1,000 mL/hr at 02/28/18 0509  . sodium chloride      PRN Medications: ALPRAZolam, heparin, ondansetron **OR** ondansetron (ZOFRAN) IV, sodium chloride, sodium chloride flush    Patient Profile   [name] is a 62 y.o. 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   Admitted 02/24/18 with A/C systolic CHF.   Assessment/Plan   1. A/C Systolic HF, NICM: Echo 06/2017 EF 25%.  Progressive renal failure, now requiring CVVH.  On dobutamine 2.5 with co-ox 53%.  CVP 19 today though in chair (reclined).  UF at 200 cc/hr.  - Continue CVVH at 200 cc/hr today as he remains volume overloaded.  -No bb with component of low output.  -Intoerant of entresto, carvedilol, torsemide, and ACE -Bidil on hold. Leave room for diuresis and renal perfusion.  - Continue dobutamine at current dose, co-ox a bit better today. - I suspect he is going to require HD.  It remains to be seen how well he will tolerate this with low output HF.  2. AKI on CKD Stage IV: HasPolycystic Kidney Disease.  Baseline Cr thought to be 3.6-3.8. - Continue CRRT. I suspect that he will be HD-dependent.  Pt and wife have been very hesitant to consider HD. May not be candidate with severe CHF.  3. HCV: He has been followed by ID and has been onMavyret per ID - No change to current plan.   4. Gout: Intolerant of allopurinol,prednisone, and uloric. - Per primary.    Medication concerns reviewed with patient and pharmacy team. Barriers identified: Compliance with medical advice.   Length of Stay: 4  Marca Ancona, MD  02/28/2018, 8:55 AM  Advanced Heart Failure Team Pager [phone] (M-F; 7a - 4p)  Please contact [facility] for night-coverage after hours (4p -7a ) and weekends on [email or web address]  Marca Ancona, MD  8:55 AM

## 2018-02-28 NOTE — Progress Notes (Signed)
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  He has an ICD.  Admitted on 6/18 with chief complaint Of worsening shortness of breath, progressive lower extremity edema, and scrotal and abdominal swelling for approximately 1 week.  He had just left AGAINST MEDICAL ADVICE on 6/16 after hospitalization at [facility] for volume overload.  During that hospitalization he required dobutamine infusion in addition to Lasix drip.  Since admission to [facility] therapeutic interventions have included supplemental oxygen, heart failure and nephrology consultation, aggressive IV diuresis, and inotropic support.  At time of critical care consult he is -506 mL; and has had really minimal response to high dosing IV Lasix.  We have been asked to transfer him to the intensive care where he can initiate CRRT for more aggressive volume removal.    SUBJECTIVE:  Awake.  Less anxious today Currently on CVVHD and tolerating 200 cc/h removal Dobutamine 2.5  VITAL SIGNS: BP 117/86   Pulse 96   Temp 98 F (36.7 C) (Oral)   Resp 18   Ht 5\' 9"  (1.753 m)   Wt 60.4 kg (133 lb 2.5 oz)   SpO2 99%   BMI 19.66 kg/m   HEMODYNAMICS: CVP:  [8 mmHg-16 mmHg] 16 mmHg  VENTILATOR SETTINGS:    INTAKE / OUTPUT: I/O last 3 completed shifts: In: 815.2 [P.O.:480; I.V.:211.1; IV Piggyback:124.1] Out: 9026 [Urine:400; Other:8626]  PHYSICAL EXAMINATION: General: Thin chronically ill gentleman, no distress, lying comfortably in bed HEENT oropharynx  somewhat dry Pulmonary: Decreased breath sounds at both bases, no wheezing Cardiac: Regular, 2/6 systolic murmur Extremities: 1-2+ lower extremity edema Abdomen: Soft, nontender, positive bowel sounds Neuro: Awake, interacting appropriately, moves all extremities and follows commands LABS:  BMET Recent Labs  Lab 02/27/18 0524 02/27/18 1600 02/28/18 0416  NA 137 138 137  K 4.4 3.9 3.8  CL 104 102 103  CO2 23 26 22   BUN 40* 32* 25*  CREATININE 2.04* 1.75* 1.71*  GLUCOSE 85 117* 111*    Electrolytes Recent Labs  Lab 02/26/18 0200  02/27/18 0524 02/27/18 1600 02/28/18 0416  CALCIUM 8.9   < > 8.6* 8.8* 7.9*  MG 2.2  --  2.3  --  2.1  PHOS 3.4   < > 3.0 2.4* 1.8*   < > = values in this interval not displayed.    CBC Recent Labs  Lab 02/26/18 0200 02/27/18 0524 02/28/18 0416  WBC 6.9 7.0 9.1  HGB 12.0* 10.9* 10.7*  HCT 40.7 36.7* 36.8*  PLT 260 225 247    Coag's Recent Labs  Lab 02/26/18 0200 02/27/18 0524 02/27/18 1212 02/27/18 1710 02/28/18 0828  APTT 146*  --  >200* 103* 175*  INR 1.27 1.54  --   --   --     Sepsis Markers No results for input(s): LATICACIDVEN, PROCALCITON, O2SATVEN in the last 168 hours.  ABG Recent Labs  Lab 02/26/18 1200 02/26/18 1927 02/27/18 1100  PHART 7.474* 7.561* 7.523*  PCO2ART 34.6 23.7* 26.3*  PO2ART 64.6* 113.0* 91.0    Liver Enzymes Recent Labs  Lab  02/23/18 1810  02/27/18 0524 02/27/18 1600 02/28/18 0416  AST 34  --   --   --   --   ALT 22  --   --   --   --   ALKPHOS 99  --   --   --   --   BILITOT 1.2  --   --   --   --   ALBUMIN 2.9*   < > 2.9* 2.9* 2.7*   < > = values in this interval not displayed.    Cardiac Enzymes No results for input(s): TROPONINI, PROBNP in the last 168 hours.  Glucose No results for input(s): GLUCAP in the last 168 hours.  Imaging Dg Chest Port 1 View  Result Date: 02/28/2018 CLINICAL DATA:  Pleural effusion. EXAM: PORTABLE CHEST 1 VIEW COMPARISON:  02/27/2018  FINDINGS: Right jugular catheter terminates over the mid SVC. An ICD remains in place. The cardiac silhouette remains enlarged. Bilateral pleural effusions and asymmetric left basilar airspace opacity are unchanged. Milder right basilar opacities are stable to slightly improved. No pneumothorax is identified. IMPRESSION: 1. Cardiomegaly and mild pulmonary vascular congestion with persistent pleural effusions and asymmetric left basilar opacity which may reflect atelectasis or pneumonia. 2. Perhaps slight improved aeration of the right lung base. Electronically Signed   By: Sebastian Ache M.D.   On: 02/28/2018 07:36     STUDIES:  Echocardiogram obtained on 6/19:EF 20 to 25% the cavity size was mildly dilated.  Severe diffuse hypokinesis the left atrium was severely dilated the right ventricle was severely dilated the right atrium was severely dilated.  His estimated peak arterial systolic pressure was 45 mmHg  CULTURES:   ANTIBIOTICS:   SIGNIFICANT EVENTS:   LINES/TUBES:   DISCUSSION: 62 year old male patient with progressive nonischemic cardiomyopathy and resultant heart failure as well as volume overload.  This is been resistant to inotrope and diuresis.  Moved 6/19 to the intensive care for CRRT  ASSESSMENT / PLAN:  Acute on chronic systolic heart failure/HFrEF EF estimated 20 to 25%; in the setting of nonischemic cardiomyopathy Plan Continue volume removal as he can tolerate, currently -15 L Continue dobutamine 2.5, dosing and adjustment per Advanced CHF team Beta-blocker on hold  Hypoxia in the setting of heart failure and progressive pulmonary edema Oxygenation improved, now weaned to room air Follow intermittent chest x-ray, effusions and atelectasis slightly improved 6/22 Careful with sedating medications, appears to be sensitive and has experienced some respiratory suppression  Sedation: Appears oversedated the a.m. of 6/21.  Improved on 6/22 Plan Precedex stopped, Xanax  decreased Follow serial neuro exam Defer ABG at this time  Acute on chronic renal failure; stage IV chronic renal disease due to polycystic renal disease.  His baseline serum creatinine is 2.8-3 CVVH ongoing, volume removal Appreciate nephrology assistance and input.  Unclear whether this is the beginning of dialysis dependence   Anemia of chronic disease Hemoglobin drift from 12 down to 10.9.  Suspect this is secondary to hemolysis on dialysis Plan Subcutaneous heparin Follow CBC  History of gout. allergic to allopurinol, prednisone, and uloric  No current evidence of flare Plan Supportive care   Hepatitis C. Plan Off Mavyret currently, resume as outpt    DVT prophylaxis: [employer] heparin  SUP: NA   Diet: renal Activity: BR Disposition : ICU   FAMILY  - Updates: no family present 6/22  - Inter-disciplinary family meet or Palliative Care meeting due by:  6/26  Independent CC time 33 minutes   Levy Pupa,  MD, PhD 02/28/2018, 11:58 AM [facility] [phone] or if no answer [phone]

## 2018-03-01 DIAGNOSIS — R570 Cardiogenic shock: Secondary | ICD-10-CM

## 2018-03-01 DIAGNOSIS — N179 Acute kidney failure, unspecified: Secondary | ICD-10-CM

## 2018-03-01 LAB — COOXEMETRY PANEL
CARBOXYHEMOGLOBIN: 1.1 % (ref 0.5–1.5)
Carboxyhemoglobin: 1 % (ref 0.5–1.5)
Carboxyhemoglobin: 1.4 % (ref 0.5–1.5)
METHEMOGLOBIN: 1.4 % (ref 0.0–1.5)
Methemoglobin: 1.5 % (ref 0.0–1.5)
Methemoglobin: 1.5 % (ref 0.0–1.5)
O2 SAT: 51.6 %
O2 Saturation: 36.8 %
O2 Saturation: 36.1 %
TOTAL HEMOGLOBIN: 11.9 g/dL — AB (ref 12.0–16.0)
TOTAL HEMOGLOBIN: 11.2 g/dL — AB (ref 12.0–16.0)
Total hemoglobin: 12 g/dL (ref 12.0–16.0)

## 2018-03-01 LAB — POCT ACTIVATED CLOTTING TIME
ACTIVATED CLOTTING TIME: 230 s
ACTIVATED CLOTTING TIME: 230 s
Activated Clotting Time: 202 seconds
Activated Clotting Time: 219 seconds
Activated Clotting Time: 224 seconds
Activated Clotting Time: 230 seconds
Activated Clotting Time: 246 seconds

## 2018-03-01 LAB — CBC
HEMATOCRIT: 40.1 % (ref 39.0–52.0)
HEMOGLOBIN: 11.6 g/dL — AB (ref 13.0–17.0)
MCH: 22.1 pg — AB (ref 26.0–34.0)
MCHC: 28.9 g/dL — ABNORMAL LOW (ref 30.0–36.0)
MCV: 76.4 fL — ABNORMAL LOW (ref 78.0–100.0)
Platelets: 258 10*3/uL (ref 150–400)
RBC: 5.25 MIL/uL (ref 4.22–5.81)
RDW: 27.9 % — ABNORMAL HIGH (ref 11.5–15.5)
WBC: 10 10*3/uL (ref 4.0–10.5)

## 2018-03-01 LAB — RENAL FUNCTION PANEL
ALBUMIN: 3.3 g/dL — AB (ref 3.5–5.0)
ALBUMIN: 3.1 g/dL — AB (ref 3.5–5.0)
ANION GAP: 7 (ref 5–15)
Anion gap: 8 (ref 5–15)
BUN: 21 mg/dL — AB (ref 6–20)
BUN: 18 mg/dL (ref 6–20)
CALCIUM: 8.9 mg/dL (ref 8.9–10.3)
CO2: 26 mmol/L (ref 22–32)
CO2: 28 mmol/L (ref 22–32)
CREATININE: 2.01 mg/dL — AB (ref 0.61–1.24)
Calcium: 9.1 mg/dL (ref 8.9–10.3)
Chloride: 101 mmol/L (ref 101–111)
Chloride: 102 mmol/L (ref 101–111)
Creatinine, Ser: 1.92 mg/dL — ABNORMAL HIGH (ref 0.61–1.24)
GFR calc Af Amer: 39 mL/min — ABNORMAL LOW (ref >60)
GFR calc non Af Amer: 34 mL/min — ABNORMAL LOW (ref >60)
GFR calc non Af Amer: 36 mL/min — ABNORMAL LOW (ref >60)
GFR, EST AFRICAN AMERICAN: 42 mL/min — AB (ref >60)
GLUCOSE: 123 mg/dL — AB (ref 65–99)
Glucose, Bld: 127 mg/dL — ABNORMAL HIGH (ref 65–99)
PHOSPHORUS: 1.7 mg/dL — AB (ref 2.5–4.6)
PHOSPHORUS: 1.3 mg/dL — AB (ref 2.5–4.6)
POTASSIUM: 4.7 mmol/L (ref 3.5–5.1)
Potassium: 4.5 mmol/L (ref 3.5–5.1)
SODIUM: 137 mmol/L (ref 135–145)
Sodium: 135 mmol/L (ref 135–145)

## 2018-03-01 LAB — MAGNESIUM: Magnesium: 2.4 mg/dL (ref 1.7–2.4)

## 2018-03-01 LAB — APTT

## 2018-03-01 NOTE — Progress Notes (Signed)
Patient ID: Kelly Leonard, male   DOB: [birth year], 62 y.o.   MRN: [id number]     Advanced Heart Failure Rounding Note  PCP-Cardiologist: Arvilla Meres, MD   Subjective:    Moved to ICU 02/25/18 for initiation of CRRT.  Remains CVVHD pulling 200/hr. Also on dobutamine 2.5 mcg/kg/min. Co-ox 53%-> 36%. Anuric. Denies SOB, orthopnea or PND. Less anxious.   CVP 10. Renal decreasing CRT to negative 100/hr  Objective:   Weight Range: 60.4 kg (133 lb 2.5 oz) Body mass index is 19.66 kg/m.   Vital Signs:   Temp:  [97.2 F (36.2 C)-97.8 F (36.6 C)] 97.7 F (36.5 C) (06/23 0752) Pulse Rate:  [94-104] 94 (06/23 0700) Resp:  [9-28] 13 (06/23 0700) BP: (115-130)/(76-101) 120/98 (06/23 0700) SpO2:  [69 %-100 %] 100 % (06/23 0700) Weight:  [60.4 kg (133 lb 2.5 oz)] 60.4 kg (133 lb 2.5 oz) (06/23 0600) Last BM Date: 02/28/18  Weight change: Filed Weights   02/27/18 0900 02/28/18 0500 03/01/18 0600  Weight: 63.9 kg (140 lb 14 oz) 60.4 kg (133 lb 2.5 oz) 60.4 kg (133 lb 2.5 oz)    Intake/Output:   Intake/Output Summary (Last 24 hours) at 03/01/2018 0837 Last data filed at 03/01/2018 0700 Gross per 24 hour  Intake 517.42 ml  Output 5111 ml  Net -4593.58 ml      Physical Exam    General:  Sitting up in bed  No resp difficulty HEENT: normal Neck: supple. RIJ trialysis. Carotids 2+ bilat; no bruits. No lymphadenopathy or thryomegaly appreciated. Cor: PMI laterally displaced. Regular rate & rhythm. +s3 Lungs: clear Abdomen: soft, nontender, nondistended. No hepatosplenomegaly. No bruits or masses. Good bowel sounds. Extremities: no cyanosis, clubbing, rash, edema Neuro: alert & orientedx3, cranial nerves grossly intact. moves all 4 extremities w/o difficulty. Affect pleasant   Telemetry   NSR 80-90s, personally reviewed.   EKG    No new tracings.    Labs    CBC Recent Labs    02/27/18 0524 02/28/18 0416 03/01/18 0400  WBC 7.0 9.1 10.0  NEUTROABS 5.0 6.4  --     HGB 10.9* 10.7* 11.6*  HCT 36.7* 36.8* 40.1  MCV 73.5* 75.4* 76.4*  PLT 225 247 258   Basic Metabolic Panel Recent Labs    40/98/11 0416 02/28/18 1552 03/01/18 0400  NA 137 134* 135  K 3.8 4.3 4.7  CL 103 100* 101  CO2 22 24 26   GLUCOSE 111* 164* 123*  BUN 25* 25* 21*  CREATININE 1.71* 2.11* 2.01*  CALCIUM 7.9* 8.8* 9.1  MG 2.1  --  2.4  PHOS 1.8* 1.9* 1.7*   Liver Function Tests Recent Labs    02/28/18 1552 03/01/18 0400  ALBUMIN 3.2* 3.3*   No results for input(s): LIPASE, AMYLASE in the last 72 hours. Cardiac Enzymes Recent Labs    02/27/18 0524  CKTOTAL 108    BNP: BNP (last 3 results) Recent Labs    02/22/18 0442 02/23/18 1810 02/26/18 0214  BNP 3,404.0* >4,500.0* 2,861.5*    ProBNP (last 3 results) No results for input(s): PROBNP in the last 8760 hours.   D-Dimer No results for input(s): DDIMER in the last 72 hours. Hemoglobin A1C No results for input(s): HGBA1C in the last 72 hours. Fasting Lipid Panel No results for input(s): CHOL, HDL, LDLCALC, TRIG, CHOLHDL, LDLDIRECT in the last 72 hours. Thyroid Function Tests No results for input(s): TSH, T4TOTAL, T3FREE, THYROIDAB in the last 72 hours.  Invalid input(s): FREET3  Other results:  Imaging    No results found.   Medications:     Scheduled Medications: . Chlorhexidine Gluconate Cloth  6 each Topical Daily  . heparin  5,000 Units Subcutaneous Q8H  . mouth rinse  15 mL Mouth Rinse BID  . OLANZapine  2.5 mg Oral QHS    Infusions: . DOBUTamine 2.5 mcg/kg/min (03/01/18 0700)  . heparin 10,000 units/ 20 mL infusion syringe 1,050 Units/hr (03/01/18 0707)  . dialysis replacement fluid (prismasate) 500 mL/hr at 02/28/18 2207  . dialysis replacement fluid (prismasate) 200 mL/hr at 02/28/18 1859  . dialysate (PRISMASATE) 1,000 mL/hr at 03/01/18 0302  . sodium chloride      PRN Medications: ALPRAZolam, heparin, ondansetron **OR** ondansetron (ZOFRAN) IV, sodium chloride,  sodium chloride flush    Patient Profile   Kelly Leonard is a 62 y.o. 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   Admitted 02/24/18 with A/C systolic CHF.   Assessment/Plan   1. A/C Systolic HF, NICM: Echo 06/2017 EF 25%.  Progressive renal failure, now requiring CVVH.   - Remains on dobutamine 2.5 with co-ox 53%-> 36%. - Volume status improving with CVVHD weight down 15 pounds. CVP 10-11. D/w Renal. Agree with slowing CVVHD rate to -100/hr -No bb with component of low output.  -Intoerant of entresto, carvedilol, torsemide, and ACE -Bidil on hold to permit diuresis and renal perfusion.  - Co-ox low on dobutamine 2.5. Will not increase right now as asymptomatic and we will eventually need to wean off. Hopefully will improved with decreased CVVHD rate - Remain anuric. Will likely require iHD.  It remains to be seen how well he will tolerate this with low output HF.  2. AKI on CKD Stage IV: HasPolycystic Kidney Disease.  Baseline Cr thought to be 3.6-3.8. - Continue CRRT. I suspect that he will be HD-dependent.  Pt and wife have been very hesitant to consider HD. May not be candidate with severe CHF.  3. HCV:  - Has completed treatment with Mavyret per ID 4. Gout: Intolerant of allopurinol,prednisone, and uloric. - Per primary.  5. Anxiety, severe - improved today   CRITICAL CARE Performed by: Arvilla Meres  Total critical care time: 35 minutes  Critical care time was exclusive of separately billable procedures and treating other patients.  Critical care was necessary to treat or prevent imminent or life-threatening deterioration.  Critical care was time spent personally by me (independent of midlevel providers or residents) on the following activities: development of treatment plan with patient and/or surrogate as well as nursing, discussions with consultants,  evaluation of patient's response to treatment, examination of patient, obtaining history from patient or surrogate, ordering and performing treatments and interventions, ordering and review of laboratory studies, ordering and review of radiographic studies, pulse oximetry and re-evaluation of patient's condition.   Length of Stay: 5  Advanced Heart Failure Team Pager [phone] (M-F; 7a - 4p)  Please contact [facility] for night-coverage after hours (4p -7a ) and weekends on [email or web address]  Arvilla Meres, MD  8:37 AM

## 2018-03-01 NOTE — Progress Notes (Signed)
PULMONARY / CRITICAL CARE MEDICINE   Name: Kelly Leonard MRN: [id number] DOB: 14-Aug-1956    ADMISSION DATE:  02/23/2018 CONSULTATION DATE: 6/19  REFERRING MD:  Maryfrances Bunnell  CHIEF COMPLAINT:  Cardiorenal syndrome and need for CRRT  HISTORY OF PRESENT ILLNESS:   This is a 62 year old male patient with a history of nonischemic cardiomyopathy, EF 25% further complicated by stage IV chronic kidney disease due to polycystic kidney with a baseline serum creatinine of 2.8-3, even further complicated by hypertension.  He has an ICD.  Admitted on 6/18 with chief complaint Of worsening shortness of breath, progressive lower extremity edema, and scrotal and abdominal swelling for approximately 1 week.  He had just left AGAINST MEDICAL ADVICE on 6/16 after hospitalization at [facility] for volume overload.  During that hospitalization he required dobutamine infusion in addition to Lasix drip.  Since admission to [facility] therapeutic interventions have included supplemental oxygen, heart failure and nephrology consultation, aggressive IV diuresis, and inotropic support.  At time of critical care consult he is -506 mL; and has had really minimal response to high dosing IV Lasix.  We have been asked to transfer him to the intensive care where he can initiate CRRT for more aggressive volume removal.    SUBJECTIVE:  -19.5 L total Note plans to decrease his UF to 100 cc/h Remains on dobutamine 2.5  VITAL SIGNS: BP (!) 120/98   Pulse 94   Temp 97.7 F (36.5 C) (Oral)   Resp 13   Ht 5\' 9"  (1.753 m)   Wt 60.4 kg (133 lb 2.5 oz)   SpO2 100%   BMI 19.66 kg/m   HEMODYNAMICS: CVP:  [13 mmHg-18 mmHg] 13 mmHg  VENTILATOR SETTINGS:    INTAKE / OUTPUT: I/O last 3 completed shifts: In: 650 [P.O.:560; I.V.:90] Out: 8319 [Urine:65; BWGYK:5993; Stool:1]  PHYSICAL EXAMINATION: General: Thin chronically ill gentleman, sitting up in a chair HEENT oropharynx clear, voice strong, good  cough Pulmonary: Distant, decreased breath sounds at both bases, no wheezing Cardiac:, 2/6 systolic murmur Extremities: 1+ lower extremity edema (improved per notes) Abdomen: Soft, benign, positive bowel sounds Neuro: Awake, appropriate, oriented, moves all extremities and follows commands LABS:  BMET Recent Labs  Lab 02/28/18 0416 02/28/18 1552 03/01/18 0400  NA 137 134* 135  K 3.8 4.3 4.7  CL 103 100* 101  CO2 22 24 26   BUN 25* 25* 21*  CREATININE 1.71* 2.11* 2.01*  GLUCOSE 111* 164* 123*    Electrolytes Recent Labs  Lab 02/27/18 0524  02/28/18 0416 02/28/18 1552 03/01/18 0400  CALCIUM 8.6*   < > 7.9* 8.8* 9.1  MG 2.3  --  2.1  --  2.4  PHOS 3.0   < > 1.8* 1.9* 1.7*   < > = values in this interval not displayed.    CBC Recent Labs  Lab 02/27/18 0524 02/28/18 0416 03/01/18 0400  WBC 7.0 9.1 10.0  HGB 10.9* 10.7* 11.6*  HCT 36.7* 36.8* 40.1  PLT 225 247 258    Coag's Recent Labs  Lab 02/26/18 0200 02/27/18 0524  02/27/18 1710 02/28/18 0828 03/01/18 0821  APTT 146*  --    < > 103* 175* >200*  INR 1.27 1.54  --   --   --   --    < > = values in this interval not displayed.    Sepsis Markers No results for input(s): LATICACIDVEN, PROCALCITON, O2SATVEN in the last 168 hours.  ABG Recent Labs  Lab 02/26/18 1200 02/26/18 1927 02/27/18  1100  PHART 7.474* 7.561* 7.523*  PCO2ART 34.6 23.7* 26.3*  PO2ART 64.6* 113.0* 91.0    Liver Enzymes Recent Labs  Lab 02/23/18 1810  02/28/18 0416 02/28/18 1552 03/01/18 0400  AST 34  --   --   --   --   ALT 22  --   --   --   --   ALKPHOS 99  --   --   --   --   BILITOT 1.2  --   --   --   --   ALBUMIN 2.9*   < > 2.7* 3.2* 3.3*   < > = values in this interval not displayed.    Cardiac Enzymes No results for input(s): TROPONINI, PROBNP in the last 168 hours.  Glucose No results for input(s): GLUCAP in the last 168 hours.  Imaging No results found.   STUDIES:  Echocardiogram obtained on  6/19:EF 20 to 25% the cavity size was mildly dilated.  Severe diffuse hypokinesis the left atrium was severely dilated the right ventricle was severely dilated the right atrium was severely dilated.  His estimated peak arterial systolic pressure was 45 mmHg  CULTURES:   ANTIBIOTICS:   SIGNIFICANT EVENTS:   LINES/TUBES:   DISCUSSION: 62 year old male patient with progressive nonischemic cardiomyopathy and resultant heart failure as well as volume overload.  This is been resistant to inotrope and diuresis.  Moved 6/19 to the intensive care for CRRT  ASSESSMENT / PLAN:  Acute on chronic systolic heart failure/HFrEF EF estimated 20 to 25%; in the setting of nonischemic cardiomyopathy Plan Continue volume removal as he can tolerate, note plans to decrease UF as below on 6/23 Continue dobutamine 2.5, dosing and adjustment per Advanced CHF team co-oximetry remains low. Beta-blocker deferred given his low cardiac output Per Dr. Prescott Gum notes he is been intolerant of Entresto, carvedilol, torsemide, ACE inhibitor [place] on hold  Hypoxia in the setting of heart failure and progressive pulmonary edema Now weaned to room air Follow intermittent chest x-ray for resolution of infiltrates, effusions/atelectasis Careful with sedating medications as he has had some respiratory suppression  Sedation: Appears oversedated the a.m. of 6/21.  Improved on 6/22 Plan Xanax adjusted down Follow serial neuro exam   Acute on chronic renal failure; stage IV chronic renal disease due to polycystic renal disease.  His baseline serum creatinine is 2.8-3 CVVH ongoing, volume removal.  Plan decrease UF to 100 cc/h on 6/23 Appreciate nephrology assistance and input.  Suspect that this may be the beginning of dialysis dependence   Anemia of chronic disease Hemoglobin drift from 12 down to 10.9.  Suspect this is secondary to hemolysis on dialysis Plan Follow CBC on subcutaneous heparin  History of  gout. allergic to allopurinol, prednisone, and uloric  No current evidence of flare Plan Supportive gout   Hepatitis C. Plan Off Mavyret, course completed per ID   DVT prophylaxis: [place] heparin  SUP: NA   Diet: renal Activity: BR Disposition : ICU   FAMILY  - Updates: no family present 6/22 or 6/23  - Inter-disciplinary family meet or Palliative Care meeting due by:  6/26  Independent CC time 31 minutes   Levy Pupa, MD, PhD 03/01/2018, 11:01 AM [facility] [phone] or if no answer [phone]

## 2018-03-01 NOTE — Progress Notes (Signed)
Bazemore, MD called and notified regarding CoOx of 36.8. Verbal orders to repeat at 0630.

## 2018-03-01 NOTE — Progress Notes (Signed)
[facility] Progress Note    Assessment/ Plan:   1. Acute systolic heart failure: he is clearly decompensated, EF 20-25%. Started empirically on dobut, Lasix, metolazone with little response.  Was transferred to ICU 6/19 and initiated CRRT after lengthy discussion with pt and wife.  Tolerating well, all 4K bath, heparin, need to follow APTT rather than ACT at this point as he's supratherapeutic on ACTs- d/w nursing staff.  Decrease UF rate to 100 mL/ hr.  D/w AHF.       2. CKD IV-V: progressive. Needs CRRT for vol removal; likely will end up ESRD--> ? If he will be able to tolerate IHD due to sig cardiomyopathy but remains to be seen.  Followed by Dr. Mosetta Pigeon in [place] but also has upcoming appt with Dr. Kathrene Bongo in our clinic as well.   3. HCV- per primary, s/p Mayvret  4. Gout- per primary.  5.  Encephalopathy: resolved.  Likely d/t oversedation previously.    6.  Anxiety: improved.  7.  Dispo: In ICU for CRRT   Subjective:    Good vol removal from CRRT   Objective:   BP (!) 120/98   Pulse 94   Temp 97.7 F (36.5 C) (Oral)   Resp 13   Ht 5\' 9"  (1.753 m)   Wt 60.4 kg (133 lb 2.5 oz)   SpO2 100%   BMI 19.66 kg/m   Intake/Output Summary (Last 24 hours) at 03/01/2018 0956 Last data filed at 03/01/2018 0700 Gross per 24 hour  Intake 394.95 ml  Output 4915 ml  Net -4520.05 ml   Weight change: -3.5 kg (-7 lb 11.5 oz)  Physical Exam: GEN sleeping, somewhat arousable HEENT sclerae anicteric NECK no JVD today PULM normal WOB, bilateral crackles improved, much more comfortable CV regular, II/VI systolic murmur, no S3 anymore ABD distension much improved EXT no pitting edema anymore NEURO AAO x 3 SKIN warm and dry, no rashes or lesions   Imaging: Dg Chest Port 1 View  Result Date: 02/28/2018 CLINICAL DATA:  Pleural effusion. EXAM: PORTABLE CHEST 1 VIEW COMPARISON:  02/27/2018 FINDINGS: Right jugular catheter terminates over  the mid SVC. An ICD remains in place. The cardiac silhouette remains enlarged. Bilateral pleural effusions and asymmetric left basilar airspace opacity are unchanged. Milder right basilar opacities are stable to slightly improved. No pneumothorax is identified. IMPRESSION: 1. Cardiomegaly and mild pulmonary vascular congestion with persistent pleural effusions and asymmetric left basilar opacity which may reflect atelectasis or pneumonia. 2. Perhaps slight improved aeration of the right lung base. Electronically Signed   By: Sebastian Ache M.D.   On: 02/28/2018 07:36    Labs: BMET Recent Labs  Lab 02/26/18 0200 02/26/18 2229 02/27/18 0524 02/27/18 1600 02/28/18 0416 02/28/18 1552 03/01/18 0400  NA 137 139 137 138 137 134* 135  K 4.2 3.6 4.4 3.9 3.8 4.3 4.7  CL 98* 108 104 102 103 100* 101  CO2 22 23 23 26 22 24 26   GLUCOSE 116* 81 85 117* 111* 164* 123*  BUN 70* 38* 40* 32* 25* 25* 21*  CREATININE 2.76* 1.88* 2.04* 1.75* 1.71* 2.11* 2.01*  CALCIUM 8.9 7.3* 8.6* 8.8* 7.9* 8.8* 9.1  PHOS 3.4 2.4* 3.0 2.4* 1.8* 1.9* 1.7*   CBC Recent Labs  Lab 02/26/18 0200 02/27/18 0524 02/28/18 0416 03/01/18 0400  WBC 6.9 7.0 9.1 10.0  NEUTROABS 5.1 5.0 6.4  --   HGB 12.0* 10.9* 10.7* 11.6*  HCT 40.7 36.7* 36.8* 40.1  MCV 75.9* 73.5*  75.4* 76.4*  PLT 260 225 247 258    Medications:    . Chlorhexidine Gluconate Cloth  6 each Topical Daily  . heparin  5,000 Units Subcutaneous Q8H  . mouth rinse  15 mL Mouth Rinse BID  . OLANZapine  2.5 mg Oral QHS      Bufford Buttner MD 03/01/2018, 9:56 AM

## 2018-03-02 ENCOUNTER — Inpatient Hospital Stay (HOSPITAL_COMMUNITY): Payer: BLUE CROSS/BLUE SHIELD

## 2018-03-02 DIAGNOSIS — R0902 Hypoxemia: Secondary | ICD-10-CM

## 2018-03-02 LAB — CBC
HCT: 37 % — ABNORMAL LOW (ref 39.0–52.0)
HEMOGLOBIN: 10.7 g/dL — AB (ref 13.0–17.0)
MCH: 22.1 pg — ABNORMAL LOW (ref 26.0–34.0)
MCHC: 28.9 g/dL — AB (ref 30.0–36.0)
MCV: 76.3 fL — ABNORMAL LOW (ref 78.0–100.0)
Platelets: 215 10*3/uL (ref 150–400)
RBC: 4.85 MIL/uL (ref 4.22–5.81)
RDW: 27.4 % — ABNORMAL HIGH (ref 11.5–15.5)
WBC: 9.5 10*3/uL (ref 4.0–10.5)

## 2018-03-02 LAB — RENAL FUNCTION PANEL
ALBUMIN: 2.9 g/dL — AB (ref 3.5–5.0)
ANION GAP: 11 (ref 5–15)
Albumin: 3 g/dL — ABNORMAL LOW (ref 3.5–5.0)
Anion gap: 7 (ref 5–15)
BUN: 19 mg/dL (ref 6–20)
BUN: 26 mg/dL — AB (ref 6–20)
CHLORIDE: 102 mmol/L (ref 101–111)
CO2: 26 mmol/L (ref 22–32)
CO2: 26 mmol/L (ref 22–32)
CREATININE: 1.84 mg/dL — AB (ref 0.61–1.24)
Calcium: 8.7 mg/dL — ABNORMAL LOW (ref 8.9–10.3)
Calcium: 8.6 mg/dL — ABNORMAL LOW (ref 8.9–10.3)
Chloride: 95 mmol/L — ABNORMAL LOW (ref 101–111)
Creatinine, Ser: 2.67 mg/dL — ABNORMAL HIGH (ref 0.61–1.24)
GFR calc Af Amer: 28 mL/min — ABNORMAL LOW (ref >60)
GFR calc non Af Amer: 38 mL/min — ABNORMAL LOW (ref >60)
GFR calc non Af Amer: 24 mL/min — ABNORMAL LOW (ref >60)
GFR, EST AFRICAN AMERICAN: 44 mL/min — AB (ref >60)
GLUCOSE: 127 mg/dL — AB (ref 65–99)
Glucose, Bld: 119 mg/dL — ABNORMAL HIGH (ref 65–99)
PHOSPHORUS: 1.9 mg/dL — AB (ref 2.5–4.6)
Phosphorus: 1.5 mg/dL — ABNORMAL LOW (ref 2.5–4.6)
Potassium: 4.9 mmol/L (ref 3.5–5.1)
Potassium: 5.8 mmol/L — ABNORMAL HIGH (ref 3.5–5.1)
SODIUM: 132 mmol/L — AB (ref 135–145)
Sodium: 135 mmol/L (ref 135–145)

## 2018-03-02 LAB — COOXEMETRY PANEL
Carboxyhemoglobin: 1.3 % (ref 0.5–1.5)
METHEMOGLOBIN: 1.5 % (ref 0.0–1.5)
O2 Saturation: 45.5 %
TOTAL HEMOGLOBIN: 11.2 g/dL — AB (ref 12.0–16.0)

## 2018-03-02 LAB — APTT: aPTT: 31 seconds (ref 24–36)

## 2018-03-02 LAB — MAGNESIUM: Magnesium: 2.5 mg/dL — ABNORMAL HIGH (ref 1.7–2.4)

## 2018-03-02 NOTE — Progress Notes (Addendum)
Patient ID: Kelly Leonard, male   DOB: [birth year], 62 y.o.   MRN: [id number]     Advanced Heart Failure Rounding Note  PCP-Cardiologist: Arvilla Meres, MD   Subjective:    Moved to ICU 02/25/18 for initiation of CRRT.  Dobutamine 2.5 mcg. CO-oX 45.5%. Continues on CVVHD. Remain anuric. Overall weight down 18 pounds.   Denies SOB. Denies CP.    Objective:   Weight Range: 126 lb 8.7 oz (57.4 kg) Body mass index is 18.69 kg/m.   Vital Signs:   Temp:  [97.6 F (36.4 C)-99.8 F (37.7 C)] 99.8 F (37.7 C) (06/24 0741) Pulse Rate:  [90-162] 95 (06/24 0600) Resp:  [10-29] 29 (06/24 0600) BP: (87-124)/(64-98) 124/82 (06/24 0600) SpO2:  [76 %-100 %] 85 % (06/24 0600) Weight:  [126 lb 8.7 oz (57.4 kg)] 126 lb 8.7 oz (57.4 kg) (06/24 0425) Last BM Date: 02/28/18  Weight change: Filed Weights   02/28/18 0500 03/01/18 0600 03/02/18 0425  Weight: 133 lb 2.5 oz (60.4 kg) 133 lb 2.5 oz (60.4 kg) 126 lb 8.7 oz (57.4 kg)    Intake/Output:   Intake/Output Summary (Last 24 hours) at 03/02/2018 0751 Last data filed at 03/02/2018 0600 Gross per 24 hour  Intake 658.02 ml  Output 1972 ml  Net -1313.98 ml      Physical Exam   CVP 11 personally checked.  General:   No resp difficulty HEENT: normal RIJ central line.  Neck: supple. no JVD. Carotids 2+ bilat; no bruits. No lymphadenopathy or thryomegaly appreciated. Cor: PMI nondisplaced. Regular rate & rhythm. No rubs, or murmurs. +s3  Lungs: clear Abdomen: soft, nontender, nondistended. No hepatosplenomegaly. No bruits or masses. Good bowel sounds. Extremities: no cyanosis, clubbing, rash, edema Neuro: alert & orientedx3, cranial nerves grossly intact. moves all 4 extremities w/o difficulty. Affect pleasant   Telemetry   NSR 80-90s personally reviewed.   EKG    No new tracings.    Labs    CBC Recent Labs    02/28/18 0416 03/01/18 0400 03/02/18 0404  WBC 9.1 10.0 9.5  NEUTROABS 6.4  --   --   HGB 10.7* 11.6* 10.7*   HCT 36.8* 40.1 37.0*  MCV 75.4* 76.4* 76.3*  PLT 247 258 215   Basic Metabolic Panel Recent Labs    53/64/68 0400 03/01/18 1658 03/02/18 0400 03/02/18 0404  NA 135 137 135  --   K 4.7 4.5 4.9  --   CL 101 102 102  --   CO2 26 28 26   --   GLUCOSE 123* 127* 119*  --   BUN 21* 18 19  --   CREATININE 2.01* 1.92* 1.84*  --   CALCIUM 9.1 8.9 8.7*  --   MG 2.4  --   --  2.5*  PHOS 1.7* 1.3* 1.5*  --    Liver Function Tests Recent Labs    03/01/18 1658 03/02/18 0400  ALBUMIN 3.1* 3.0*   No results for input(s): LIPASE, AMYLASE in the last 72 hours. Cardiac Enzymes No results for input(s): CKTOTAL, CKMB, CKMBINDEX, TROPONINI in the last 72 hours.  BNP: BNP (last 3 results) Recent Labs    02/22/18 0442 02/23/18 1810 02/26/18 0214  BNP 3,404.0* >4,500.0* 2,861.5*    ProBNP (last 3 results) No results for input(s): PROBNP in the last 8760 hours.   D-Dimer No results for input(s): DDIMER in the last 72 hours. Hemoglobin A1C No results for input(s): HGBA1C in the last 72 hours. Fasting Lipid Panel No results for  input(s): CHOL, HDL, LDLCALC, TRIG, CHOLHDL, LDLDIRECT in the last 72 hours. Thyroid Function Tests No results for input(s): TSH, T4TOTAL, T3FREE, THYROIDAB in the last 72 hours.  Invalid input(s): FREET3  Other results:   Imaging    No results found.   Medications:     Scheduled Medications: . Chlorhexidine Gluconate Cloth  6 each Topical Daily  . heparin  5,000 Units Subcutaneous Q8H  . mouth rinse  15 mL Mouth Rinse BID  . OLANZapine  2.5 mg Oral QHS    Infusions: . DOBUTamine 2.5 mcg/kg/min (03/02/18 0600)  . heparin 10,000 units/ 20 mL infusion syringe Stopped (03/01/18 1040)  . dialysis replacement fluid (prismasate) 500 mL/hr at 03/02/18 0630  . dialysis replacement fluid (prismasate) 200 mL/hr at 03/01/18 1930  . dialysate (PRISMASATE) 1,000 mL/hr at 03/02/18 0630  . sodium chloride      PRN Medications: ALPRAZolam, heparin,  ondansetron **OR** ondansetron (ZOFRAN) IV, sodium chloride, sodium chloride flush    Patient Profile   Kelly Leonard is a 62 y.o. 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   Admitted 02/24/18 with A/C systolic CHF.   Assessment/Plan   1. A/C Systolic HF, NICM: Echo 06/2017 EF 25%.  Progressive renal failure, now requiring CVVH.   - Remains on dobutamine 2.5 mcg. CO-OX 45.5 % Continues on CRRT. Overall weight down 18 pounds.  -No bb with component of low output.  -Intoerant of entresto, carvedilol, torsemide, and ACE -Continue to hold bidil for renal perfusion.   - CO-OX remains low on dobutamine 2.5 mcg. CO-OX 45.5%.  - Remain anuric. Will likely require iHD.  2. AKI on CKD Stage IV: HasPolycystic Kidney Disease.  Baseline Cr thought to be 3.6-3.8. - Continue CRRT. Suspect that he will be HD-dependent.   Pt and wife have been very hesitant to consider HD. May not be candidate with severe CHF.  - Remains anuric.  3. HCV:  - Has completed treatment with Mavyret per ID 4. Gout: Intolerant of allopurinol,prednisone, and uloric. - Per primary.  5. Anxiety, severe - improved today    Length of Stay: 6  Advanced Heart Failure Team Pager [phone] (M-F; 7a - 4p)  Please contact [facility] for night-coverage after hours (4p -7a ) and weekends on [email or web address]  Tonye Becket, NP  7:51 AM   Patient seen and examined with Tonye Becket, NP. We discussed all aspects of the encounter. I agree with the assessment and plan as stated above.   He completed CVVHD this am. Weight down 22 pounds.CVP 6. On dobutamine 2.5. Co-ox 46%. Denies SOB, orthopnea or PND.   In bed. Cachetic NAD RIJ trialysis Cor laterally misplaced. +s3 Lungs cta Ab soft ND Ext cachetic. Warm no edema  He remains very tenuous. Volume status now back at baseline with CVVHD but making very little urine.  Suspect he will need iHD but I am not sure his heart will tolerate it especially without inotropes. Will continue dobutamine for now. He may be approaching Palliative situation He is not candidate for heart-kidney transplant.   CRITICAL CARE Performed by: Arvilla Meres  Total critical care time: 35 minutes  Critical care time was exclusive of separately billable procedures and treating other patients.  Critical care was necessary to treat or prevent imminent or life-threatening deterioration.  Critical care was time spent personally by me (independent of midlevel providers or residents) on the following activities: development of  treatment plan with patient and/or surrogate as well as nursing, discussions with consultants, evaluation of patient's response to treatment, examination of patient, obtaining history from patient or surrogate, ordering and performing treatments and interventions, ordering and review of laboratory studies, ordering and review of radiographic studies, pulse oximetry and re-evaluation of patient's condition.  Arvilla Meres, MD  9:49 AM

## 2018-03-02 NOTE — Progress Notes (Signed)
Kelly Leonard  Assessment/ Plan: Kelly Leonard is a 62 y.o. yo male with history of nonischemic cardiomyopathy EF 25% CKD stage IV due to polycystic kidney disease, admitted with decompensated congestive heart failure.  Started on CRRT on 6/19.  Assessment/Plan:  #CKD stage IV/V progressive with fluid overload on admission: Started CRRT on 6/19.  Patient is net negative by 20 L since admission.  He has no edema and electrolytes looks okay.  Not making urine.  Likely progressing to ESRD.  The patient wants to be off of CRRT.  I will discontinue CRRT today.  Monitor urine output and electrolytes.  Discussed about whether patient can tolerate intermittent hemodialysis because of poor cardiac function.  Avoid nephrotoxins.vFollowed by Dr. Mosetta Pigeon in [place] but also has upcoming appt with Dr. Kathrene Bongo in our clinic as well.  #Acute decompensated systolic heart failure: EF 20 to 25%.  Dobutamine.  Clinically improving.  Discussed with the heart failure team.  #Acute encephalopathy: Improved now.  #HCV: Per primary team, outpatient follow-up.  Subjective: Seen and examined at bedside.  Reported feeling good.  Denies chest pain, shortness of breath.  Wanted to come off of CRRT.  No urine output noticed. Objective Vital signs in last 24 hours: Vitals:   03/02/18 0500 03/02/18 0600 03/02/18 0741 03/02/18 0800  BP: (!) 87/64 124/82  (!) 111/93  Pulse: 92 95  96  Resp: 14 (!) 29  16  Temp:   99.8 F (37.7 C) 99.8 F (37.7 C)  TempSrc:   Oral Oral  SpO2: 99% (!) 85%  100%  Weight:      Height:       Weight change: -3 kg (-6 lb 9.8 oz)  Intake/Output Summary (Last 24 hours) at 03/02/2018 0851 Last data filed at 03/02/2018 0800 Gross per 24 hour  Intake 540.54 ml  Output 1915 ml  Net -1374.46 ml       Labs: Basic Metabolic Panel: Recent Labs  Lab 03/01/18 0400 03/01/18 1658 03/02/18 0400  NA 135 137 135  K 4.7 4.5 4.9  CL 101 102 102  CO2  26 28 26   GLUCOSE 123* 127* 119*  BUN 21* 18 19  CREATININE 2.01* 1.92* 1.84*  CALCIUM 9.1 8.9 8.7*  PHOS 1.7* 1.3* 1.5*   Liver Function Tests: Recent Labs  Lab 02/23/18 1810  03/01/18 0400 03/01/18 1658 03/02/18 0400  AST 34  --   --   --   --   ALT 22  --   --   --   --   ALKPHOS 99  --   --   --   --   BILITOT 1.2  --   --   --   --   PROT 6.3*  --   --   --   --   ALBUMIN 2.9*   < > 3.3* 3.1* 3.0*   < > = values in this interval not displayed.   No results for input(s): LIPASE, AMYLASE in the last 168 hours. No results for input(s): AMMONIA in the last 168 hours. CBC: Recent Labs  Lab 02/26/18 0200 02/27/18 0524 02/28/18 0416 03/01/18 0400 03/02/18 0404  WBC 6.9 7.0 9.1 10.0 9.5  NEUTROABS 5.1 5.0 6.4  --   --   HGB 12.0* 10.9* 10.7* 11.6* 10.7*  HCT 40.7 36.7* 36.8* 40.1 37.0*  MCV 75.9* 73.5* 75.4* 76.4* 76.3*  PLT 260 225 247 258 215   Cardiac Enzymes: Recent Labs  Lab 02/26/18 0200 02/27/18  0524  CKTOTAL 113 108   CBG: No results for input(s): GLUCAP in the last 168 hours.  Iron Studies: No results for input(s): IRON, TIBC, TRANSFERRIN, FERRITIN in the last 72 hours. Studies/Results: Dg Chest Port 1 View  Result Date: 03/02/2018 CLINICAL DATA:  CHF. EXAM: PORTABLE CHEST 1 VIEW COMPARISON:  February 28, 2017 FINDINGS: The right central line is stable. No pneumothorax. A single lead AICD device is in good position. Stable cardiomegaly. The hila and mediastinum are unremarkable. Mild bibasilar opacities have improved. No other interval change. IMPRESSION: 1. Support apparatus as above. 2. Mild bibasilar opacities have improved.  No overt edema. Electronically Signed   By: Gerome Sam III M.D   On: 03/02/2018 08:09    Medications: Infusions: . DOBUTamine 2.5 mcg/kg/min (03/02/18 0800)  . heparin 10,000 units/ 20 mL infusion syringe Stopped (03/01/18 1040)  . sodium chloride      Scheduled Medications: . Chlorhexidine Gluconate Cloth  6 each Topical  Daily  . heparin  5,000 Units Subcutaneous Q8H  . mouth rinse  15 mL Mouth Rinse BID  . OLANZapine  2.5 mg Oral QHS    have reviewed scheduled and prn medications.  Physical Exam: General:NAD, comfortable Heart:RRR, s1s2 nl Lungs:clear b/l, no cracjle Abdomen:soft, Non-tender, non-distended Extremities:No edema Dialysis Access: IJ temporary catheter  Dron Jaynie Collins 03/02/2018,8:52 AM  LOS: 6 days

## 2018-03-02 NOTE — Progress Notes (Addendum)
PULMONARY / CRITICAL CARE MEDICINE   Name: Kelly Leonard MRN: [id number] DOB: Dec 18, 1955    ADMISSION DATE:  02/23/2018 CONSULTATION DATE: 6/19  REFERRING MD:  Maryfrances Bunnell  CHIEF COMPLAINT:  Cardiorenal syndrome and need for CRRT  HISTORY OF PRESENT ILLNESS:   This is a 62 year old male patient with a history of nonischemic cardiomyopathy, EF 25% further complicated by stage IV chronic kidney disease due to polycystic kidney with a baseline serum creatinine of 2.8-3, even further complicated by hypertension.  He has an ICD.  Admitted on 6/18 with chief complaint Of worsening shortness of breath, progressive lower extremity edema, and scrotal and abdominal swelling for approximately 1 week.  He had just left AGAINST MEDICAL ADVICE on 6/16 after hospitalization at [facility] for volume overload.  During that hospitalization he required dobutamine infusion in addition to Lasix drip.  Since admission to [facility] therapeutic interventions have included supplemental oxygen, heart failure and nephrology consultation, aggressive IV diuresis, and inotropic support.  At time of critical care consult he is -506 mL; and has had really minimal response to high dosing IV Lasix.  We have been asked to transfer him to the intensive care where he can initiate CRRT for more aggressive volume removal.    SUBJECTIVE:  On room air CRRT  discontinued 6/24 20 L negative since admit Complaints of dry cough Intermittent Shortness of breath (often in presence of anxiety)  VITAL SIGNS: BP 102/86 (BP Location: Left Arm)   Pulse 96   Temp 99.8 F (37.7 C) (Oral)   Resp (!) 21   Ht 5\' 9"  (1.753 m)   Wt 126 lb 8.7 oz (57.4 kg)   SpO2 98%   BMI 18.69 kg/m   HEMODYNAMICS: CVP:  [6 mmHg-18 mmHg] 6 mmHg  VENTILATOR SETTINGS:    INTAKE / OUTPUT: I/O last 3 completed shifts: In: 797.9 [P.O.:700; I.V.:97.9] Out: 4409 [Urine:50; Other:4359]  PHYSICAL EXAMINATION: General: Chronically ill-appearing  male, thin, sitting on side of bed, on room air, in no acute distress HEENT: Atraumatic, normocephalic, neck supple, MM moist/pink Pulmonary: Clear breath sounds, fine crackles at bases bilaterally, no wheezing, even, non-labored, normal work of breathing Cardiac: RRR, 2/6 systolic murmur  Extremities: Active ROM all extremities, no edema Abdomen: Soft, nontender, BS+ x4, small umbilical hernia  Neuro: Awake and alert, oriented, appropriate, follows commands, no focal deficits LABS:  BMET Recent Labs  Lab 03/01/18 0400 03/01/18 1658 03/02/18 0400  NA 135 137 135  K 4.7 4.5 4.9  CL 101 102 102  CO2 26 28 26   BUN 21* 18 19  CREATININE 2.01* 1.92* 1.84*  GLUCOSE 123* 127* 119*    Electrolytes Recent Labs  Lab 02/28/18 0416  03/01/18 0400 03/01/18 1658 03/02/18 0400 03/02/18 0404  CALCIUM 7.9*   < > 9.1 8.9 8.7*  --   MG 2.1  --  2.4  --   --  2.5*  PHOS 1.8*   < > 1.7* 1.3* 1.5*  --    < > = values in this interval not displayed.    CBC Recent Labs  Lab 02/28/18 0416 03/01/18 0400 03/02/18 0404  WBC 9.1 10.0 9.5  HGB 10.7* 11.6* 10.7*  HCT 36.8* 40.1 37.0*  PLT 247 258 215    Coag's Recent Labs  Lab 02/26/18 0200 02/27/18 0524  02/28/18 0828 03/01/18 0821 03/02/18 0404  APTT 146*  --    < > 175* >200* 31  INR 1.27 1.54  --   --   --   --    < > =  values in this interval not displayed.    Sepsis Markers No results for input(s): LATICACIDVEN, PROCALCITON, O2SATVEN in the last 168 hours.  ABG Recent Labs  Lab 02/26/18 1200 02/26/18 1927 02/27/18 1100  PHART 7.474* 7.561* 7.523*  PCO2ART 34.6 23.7* 26.3*  PO2ART 64.6* 113.0* 91.0    Liver Enzymes Recent Labs  Lab 02/23/18 1810  03/01/18 0400 03/01/18 1658 03/02/18 0400  AST 34  --   --   --   --   ALT 22  --   --   --   --   ALKPHOS 99  --   --   --   --   BILITOT 1.2  --   --   --   --   ALBUMIN 2.9*   < > 3.3* 3.1* 3.0*   < > = values in this interval not displayed.    Cardiac  Enzymes No results for input(s): TROPONINI, PROBNP in the last 168 hours.  Glucose No results for input(s): GLUCAP in the last 168 hours.  Imaging Dg Chest Port 1 View  Result Date: 03/02/2018 CLINICAL DATA:  CHF. EXAM: PORTABLE CHEST 1 VIEW COMPARISON:  February 28, 2017 FINDINGS: The right central line is stable. No pneumothorax. A single lead AICD device is in good position. Stable cardiomegaly. The hila and mediastinum are unremarkable. Mild bibasilar opacities have improved. No other interval change. IMPRESSION: 1. Support apparatus as above. 2. Mild bibasilar opacities have improved.  No overt edema. Electronically Signed   By: Gerome Sam III M.D   On: 03/02/2018 08:09     STUDIES:  Echocardiogram obtained on 6/19:EF 20 to 25% the cavity size was mildly dilated.  Severe diffuse hypokinesis the left atrium was severely dilated the right ventricle was severely dilated the right atrium was severely dilated.  His estimated peak arterial systolic pressure was 45 mmHg  CULTURES:   ANTIBIOTICS:   SIGNIFICANT EVENTS: CRRT initiated 6/19 CRRT discontinued 6/24  LINES/TUBES:   DISCUSSION: 62 year old male patient with progressive nonischemic cardiomyopathy and resultant heart failure as well as volume overload.  This is been resistant to inotrope and diuresis.  Moved 6/19 to the intensive care for CRRT  ASSESSMENT / PLAN:  Acute on chronic systolic heart failure/HFrEF EF estimated 20 to 25%; in the setting of nonischemic cardiomyopathy Plan Cardiology/Heart failure team following, appreciate input D/c CRRT on 6/24 per Nephrology Continue Dobutamine 2.5 mcg per Cardiology/Heart Failure team Given low cardiac output, Beta-blockers have been deferred Per Dr. Prescott Gum note, he is intolerant to Entresto, Carvedilol, torsemide, ACE inhibitors, Bidill on hold  Hypoxia in the setting of heart failure and progressive pulmonary edema -Now weaned to room air Plan Supplemental O2 as  needed to maintain O2 sats >92% Follow intermittent CXR Avoid sedating meds as able    Sedation: Appears oversedated the a.m. of 6/21.  Improved on 6/22>>Resolved 6/24 Plan Follow serial neuro exams Continue Xanax at current dose 0.25 mg TID prn   Acute on chronic renal failure; stage IV chronic renal disease due to polycystic renal disease.  His baseline serum creatinine is 2.8-3 Plan Nephrology following, appreciate input CRRT discontinued 6/24 per Nephrology Per Nephrology, pt likely progressing to ESRD and need for intermittent HD, but concern over pt's ability to tolerate HD given his poor cardiac function   Anemia of chronic disease Hemoglobin drift from 12 down to 10.9.  Suspect this is secondary to hemolysis on dialysis Plan Monitor for s/sx of bleeding Trend CBC's SQ Heparin for VTE prophylaxis  Transfuse for Hgb<7  History of gout. allergic to allopurinol, prednisone, and uloric  No current evidence of flare Plan Supportive care   Hepatitis C. Plan Off Mavyret, complete course per ID   DVT prophylaxis: SQ Heparin SUP: N/a Diet: Renal Activity: BR Disposition: ICU   FAMILY  - Updates: No family presented, updated pt at bedside 6/24.  - Inter-disciplinary family meet or Palliative Care meeting due by:  6/26  Pt is currently stable from pulmonary standpoint.  Likely approaching need for palliative care given his possible need for HD and concerns over his ability to tolerate HD given his poor cardiac function.  Pt is noted not to be a candidate for heart or kidney transplants.   Harlon Ditty, AGACNP-BC  Pulmonary & Critical Care Medicine 03/02/2018, 11:42 AM  Attending Note:  62 year old with severe heart failure and cardiogenic shock resulting into renal failure.  Patient's precludes him of HD.  Tolerated CRRT for 5 days thus far.  On exam, diffuse crackles noted.  Remains on dobutamine.  I reviewed CXR myself pulmonary edema noted.  Discussed  with PCCM-NP.  Will continue dobutamine for now.  Attempt HD and see if patient tolerates.  AM labs ordered.  Will likely need to approach the situation from a palliative perspective if patient does not tolerate HD and being off dobutamine.  Appreciate input from heart failure team.  The patient is critically ill with multiple organ systems failure and requires high complexity decision making for assessment and support, frequent evaluation and titration of therapies, application of advanced monitoring technologies and extensive interpretation of multiple databases.   Critical Care Time devoted to patient care services described in this note is  31  Minutes. This time reflects time of care of this signee Dr Koren Bound. This critical care time does not reflect procedure time, or teaching time or supervisory time of PA/NP/Med student/Med Resident etc but could involve care discussion time.  Alyson Reedy, M.D. [facility] Pulmonary/Critical Care Medicine. Pager: [phone]. After hours pager: [phone].

## 2018-03-03 ENCOUNTER — Inpatient Hospital Stay (HOSPITAL_COMMUNITY): Payer: BLUE CROSS/BLUE SHIELD

## 2018-03-03 DIAGNOSIS — N19 Unspecified kidney failure: Secondary | ICD-10-CM

## 2018-03-03 DIAGNOSIS — Z515 Encounter for palliative care: Secondary | ICD-10-CM

## 2018-03-03 DIAGNOSIS — Z7189 Other specified counseling: Secondary | ICD-10-CM

## 2018-03-03 DIAGNOSIS — I1310 Hypertensive heart and chronic kidney disease without heart failure, with stage 1 through stage 4 chronic kidney disease, or unspecified chronic kidney disease: Secondary | ICD-10-CM

## 2018-03-03 LAB — BASIC METABOLIC PANEL
Anion gap: 11 (ref 5–15)
BUN: 42 mg/dL — ABNORMAL HIGH (ref 8–23)
CALCIUM: 9.1 mg/dL (ref 8.9–10.3)
CO2: 27 mmol/L (ref 22–32)
CREATININE: 3.49 mg/dL — AB (ref 0.61–1.24)
Chloride: 92 mmol/L — ABNORMAL LOW (ref 98–111)
GFR, EST AFRICAN AMERICAN: 20 mL/min — AB (ref >60)
GFR, EST NON AFRICAN AMERICAN: 17 mL/min — AB (ref >60)
Glucose, Bld: 126 mg/dL — ABNORMAL HIGH (ref 70–99)
Potassium: 7.1 mmol/L (ref 3.5–5.1)
SODIUM: 130 mmol/L — AB (ref 135–145)

## 2018-03-03 LAB — RENAL FUNCTION PANEL
ANION GAP: 11 (ref 5–15)
Albumin: 2.8 g/dL — ABNORMAL LOW (ref 3.5–5.0)
BUN: 39 mg/dL — ABNORMAL HIGH (ref 8–23)
CO2: 29 mmol/L (ref 22–32)
Calcium: 8.9 mg/dL (ref 8.9–10.3)
Chloride: 92 mmol/L — ABNORMAL LOW (ref 98–111)
Creatinine, Ser: 3.1 mg/dL — ABNORMAL HIGH (ref 0.61–1.24)
GFR calc non Af Amer: 20 mL/min — ABNORMAL LOW (ref >60)
GFR, EST AFRICAN AMERICAN: 23 mL/min — AB (ref >60)
Glucose, Bld: 100 mg/dL — ABNORMAL HIGH (ref 70–99)
POTASSIUM: 6.5 mmol/L — AB (ref 3.5–5.1)
Phosphorus: 2 mg/dL — ABNORMAL LOW (ref 2.5–4.6)
Sodium: 132 mmol/L — ABNORMAL LOW (ref 135–145)

## 2018-03-03 LAB — CBC
HCT: 34.6 % — ABNORMAL LOW (ref 39.0–52.0)
Hemoglobin: 10.1 g/dL — ABNORMAL LOW (ref 13.0–17.0)
MCH: 21.9 pg — ABNORMAL LOW (ref 26.0–34.0)
MCHC: 29.2 g/dL — ABNORMAL LOW (ref 30.0–36.0)
MCV: 75.1 fL — AB (ref 78.0–100.0)
PLATELETS: 181 10*3/uL (ref 150–400)
RBC: 4.61 MIL/uL (ref 4.22–5.81)
RDW: 26.7 % — AB (ref 11.5–15.5)
WBC: 9.5 10*3/uL (ref 4.0–10.5)

## 2018-03-03 LAB — MAGNESIUM: MAGNESIUM: 2.6 mg/dL — AB (ref 1.7–2.4)

## 2018-03-03 LAB — APTT: APTT: 35 s (ref 24–36)

## 2018-03-03 LAB — COOXEMETRY PANEL
CARBOXYHEMOGLOBIN: 1.4 % (ref 0.5–1.5)
METHEMOGLOBIN: 1.4 % (ref 0.0–1.5)
O2 SAT: 49.9 %
Total hemoglobin: 10.4 g/dL — ABNORMAL LOW (ref 12.0–16.0)

## 2018-03-03 MED ORDER — ALPRAZOLAM 1 MG PO TABS: 1.0000 mg | ORAL_TABLET | Freq: Four times a day (QID) | ORAL | 0 refills | Status: AC | PRN

## 2018-03-03 MED ORDER — ALPRAZOLAM 1 MG PO TABS: 1.0000 mg | ORAL_TABLET | Freq: Four times a day (QID) | ORAL | 0 refills | Status: DC | PRN

## 2018-03-03 MED ORDER — HYDROCOD POLST-CPM POLST ER 10-8 MG/5ML PO SUER: 5.0000 mL | Freq: Two times a day (BID) | ORAL | 0 refills | Status: AC

## 2018-03-03 MED ORDER — HYDROCOD POLST-CPM POLST ER 10-8 MG/5ML PO SUER
5.0000 mL | Freq: Two times a day (BID) | ORAL | Status: DC
Start: 2018-03-03 — End: 2018-03-03

## 2018-03-03 MED ORDER — OXYCODONE HCL 5 MG/5ML PO SOLN: 5.0000 mg | ORAL | Status: DC | PRN

## 2018-03-03 MED ORDER — OXYCODONE HCL 5 MG/5ML PO SOLN: 5.0000 mg | ORAL | 0 refills | Status: AC | PRN

## 2018-03-03 MED ORDER — LORAZEPAM 2 MG/ML PO CONC: 1.0000 mg | ORAL | Status: DC | PRN

## 2018-03-03 MED ORDER — PATIROMER SORBITEX CALCIUM 8.4 G PO PACK
16.8000 g | PACK | Freq: Once | ORAL | Status: AC
  Administered 2018-03-03: 16.8 g via ORAL
  Filled 2018-03-03: qty 2

## 2018-03-03 NOTE — Progress Notes (Signed)
Pt is wanting to leave and go home to be with family.  Pt has been requiring Dobutamine infusion, and is needing HD given his volume status and hyperkalemia.  Pt is alert and oriented x4, and appropriate. Dr. Molli Knock and myself, are both at bedside, explaining to pt that is he leaves, there is a likely chance that he will die given his heart failure and ESRD.  Pt verbalizes understanding , and is decided that he goes home today to be with his family.  Pt has agreed to wait for meeting with Palliative Care at 12:00 to make home arrangements. Per Dr. Molli Knock, pt made DNR.

## 2018-03-03 NOTE — Progress Notes (Addendum)
Patient ID: Kelly Leonard, male   DOB: 07-Jan-1956, 62 y.o.   MRN: [id number]     Advanced Heart Failure Rounding Note  PCP-Cardiologist: Arvilla Meres, MD   Subjective:    Moved to ICU 02/25/18 for initiation of CRRT.  CRRT Stopped 6/24  Remains on dobutamine 2.5 mcg. CO-OX 50%.   Feeling ok. Hungry. Denies SOB. Objective:   Weight Range: 109 lb 8 oz (49.7 kg) Body mass index is 16.17 kg/m.   Vital Signs:   Temp:  [97.4 F (36.3 C)-99.8 F (37.7 C)] 98 F (36.7 C) (06/25 0356) Pulse Rate:  [83-169] 86 (06/25 0600) Resp:  [9-29] 13 (06/25 0600) BP: (72-119)/(45-93) 72/45 (06/25 0600) SpO2:  [66 %-100 %] 100 % (06/25 0600) Weight:  [109 lb 8 oz (49.7 kg)] 109 lb 8 oz (49.7 kg) (06/25 0449) Last BM Date: 03/02/18  Weight change: Filed Weights   03/01/18 0600 03/02/18 0425 03/03/18 0449  Weight: 133 lb 2.5 oz (60.4 kg) 126 lb 8.7 oz (57.4 kg) 109 lb 8 oz (49.7 kg)    Intake/Output:   Intake/Output Summary (Last 24 hours) at 03/03/2018 0758 Last data filed at 03/03/2018 0400 Gross per 24 hour  Intake 105.32 ml  Output 406 ml  Net -300.68 ml      Physical Exam  CVP 19 personally checked.  General:  Thin. Sitting on the side of the bed. No resp difficulty HEENT: normal. RIJ HD catheter.  Neck: supple. JVP jaw . Carotids 2+ bilat; no bruits. No lymphadenopathy or thryomegaly appreciated. Cor: PMI nondisplaced. Regular rate & rhythm. No rubs, or murmurs.  +S3  Lungs: clear Abdomen: soft, nontender, nondistended. No hepatosplenomegaly. No bruits or masses. Good bowel sounds. Extremities: no cyanosis, clubbing, rash, edema Neuro: alert & orientedx3, cranial nerves grossly intact. moves all 4 extremities w/o difficulty. Affect pleasant   Telemetry   NSR 80-90s personally reviewed.   EKG    No new tracings.    Labs    CBC Recent Labs    03/02/18 0404 03/03/18 0428  WBC 9.5 9.5  HGB 10.7* 10.1*  HCT 37.0* 34.6*  MCV 76.3* 75.1*  PLT 215 181    Basic Metabolic Panel Recent Labs    50/93/26 0404 03/02/18 1614 03/03/18 0427  NA  --  132* 132*  K  --  5.8* 6.5*  CL  --  95* 92*  CO2  --  26 29  GLUCOSE  --  127* 100*  BUN  --  26* 39*  CREATININE  --  2.67* 3.10*  CALCIUM  --  8.6* 8.9  MG 2.5*  --  2.6*  PHOS  --  1.9* 2.0*   Liver Function Tests Recent Labs    03/02/18 1614 03/03/18 0427  ALBUMIN 2.9* 2.8*   No results for input(s): LIPASE, AMYLASE in the last 72 hours. Cardiac Enzymes No results for input(s): CKTOTAL, CKMB, CKMBINDEX, TROPONINI in the last 72 hours.  BNP: BNP (last 3 results) Recent Labs    02/22/18 0442 02/23/18 1810 02/26/18 0214  BNP 3,404.0* >4,500.0* 2,861.5*    ProBNP (last 3 results) No results for input(s): PROBNP in the last 8760 hours.   D-Dimer No results for input(s): DDIMER in the last 72 hours. Hemoglobin A1C No results for input(s): HGBA1C in the last 72 hours. Fasting Lipid Panel No results for input(s): CHOL, HDL, LDLCALC, TRIG, CHOLHDL, LDLDIRECT in the last 72 hours. Thyroid Function Tests No results for input(s): TSH, T4TOTAL, T3FREE, THYROIDAB in the last 72 hours.  Invalid input(s): FREET3  Other results:   Imaging    No results found.   Medications:     Scheduled Medications: . Chlorhexidine Gluconate Cloth  6 each Topical Daily  . heparin  5,000 Units Subcutaneous Q8H  . mouth rinse  15 mL Mouth Rinse BID  . OLANZapine  2.5 mg Oral QHS  . patiromer  16.8 g Oral Once    Infusions: . DOBUTamine 2.5 mcg/kg/min (03/03/18 0400)  . sodium chloride      PRN Medications: ALPRAZolam, heparin, ondansetron **OR** ondansetron (ZOFRAN) IV, sodium chloride, sodium chloride flush    Patient Profile   Kelly Leonard is a 62 y.o. 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   Admitted 02/24/18 with A/C systolic CHF.   Assessment/Plan    1. A/C Systolic HF, NICM: Echo 06/2017 EF 25%.  Progressive renal failure, now requiring CVVH.   - Remains on dobutamine 2.5 mcg. CO-OX 50%. CVP trending up to 19.  -CRRT stopped 6/24 -No bb with component of low output.  -Intoerant of entresto, carvedilol, torsemide, and ACE -Continue to hold bidil for renal perfusion.   - Will likely require iHD.  2. AKI on CKD Stage IV: HasPolycystic Kidney Disease.  Baseline Cr thought to be 3.6-3.8. - CRRT stopped 6/24  -Suspect that he will be HD-dependent. -Made 310 cc urine.    Pt and wife have been very hesitant to consider HD. May not be candidate with severe CHF.  3. HCV:  - Has completed treatment with Mavyret per ID 4. Gout: Intolerant of allopurinol,prednisone, and uloric. - Per primary.  5. Anxiety, severe - improved today 6. Hyperkalemia K 6.5 . Receiving veltassa per nephrology.     Length of Stay: 7  Advanced Heart Failure Team Pager [phone] (M-F; 7a - 4p)  Please contact [facility] for night-coverage after hours (4p -7a ) and weekends on [email or web address]  Tonye Becket, NP  7:58 AM   Agree with above.  Patient with worsening renal function and hyperkalemia off CVVHD. CVP 19. Discussed with Renal and CCM. Not candidate for iHD due to poor cardiac output.   On exam Weak appearing. RIJ trialysis JVP to ear Cor pmi displaced. +s3 Lungs clear. Ab soft + distended Ext cachetic. Cool  He has now has ESRD in setting of persistent cardiogenic shock. Not candidate for advanced therapies. D/w patient and his wife. They are now amenable to Hospice and want to leave hospital immediately. I am worried he will not survived there week.   CRITICAL CARE Performed by: Arvilla Meres  Total critical care time: 35 minutes  Critical care time was exclusive of separately billable procedures and treating other patients.  Critical care was necessary to treat or prevent imminent or life-threatening deterioration.  Critical care  was time spent personally by me (independent of midlevel providers or residents) on the following activities: development of treatment plan with patient and/or surrogate as well as nursing, discussions with consultants, evaluation of patient's response to treatment, examination of patient, obtaining history from patient or surrogate, ordering and performing treatments and interventions, ordering and review of laboratory studies, ordering and review of radiographic studies, pulse oximetry and re-evaluation of patient's condition.   Arvilla Meres, MD  2:24 PM

## 2018-03-03 NOTE — Progress Notes (Signed)
Palliative consult received-   Patient agreed to meet with palliative provider at 12.  Ocie Bob, AGNP-C Palliative Medicine  Please call Palliative Medicine team phone with any questions [phone]. For individual providers please see AMION.

## 2018-03-03 NOTE — Progress Notes (Signed)
PULMONARY / CRITICAL CARE MEDICINE   Name: Kelly Leonard MRN: [id number] DOB: [birth year]    ADMISSION DATE:  02/23/2018 CONSULTATION DATE: 6/19  REFERRING MD:  Maryfrances Bunnell  CHIEF COMPLAINT:  Cardiorenal syndrome and need for CRRT  HISTORY OF PRESENT ILLNESS:   This is a 62 year old male patient with a history of nonischemic cardiomyopathy, EF 25% further complicated by stage IV chronic kidney disease due to polycystic kidney with a baseline serum creatinine of 2.8-3, even further complicated by hypertension.  He has an ICD.  Admitted on 6/18 with chief complaint Of worsening shortness of breath, progressive lower extremity edema, and scrotal and abdominal swelling for approximately 1 week.  He had just left AGAINST MEDICAL ADVICE on 6/16 after hospitalization at [facility] for volume overload.  During that hospitalization he required dobutamine infusion in addition to Lasix drip.  Since admission to [facility] therapeutic interventions have included supplemental oxygen, heart failure and nephrology consultation, aggressive IV diuresis, and inotropic support.  At time of critical care consult he is -506 mL; and has had really minimal response to high dosing IV Lasix.  We have been asked to transfer him to the intensive care where he can initiate CRRT for more aggressive volume removal.    SUBJECTIVE:  On RA, off CRRT 6/24 but UOP is negligible and K is rising. 20 L negative since admit No further events overnight  VITAL SIGNS: BP 94/73 (BP Location: Right Arm)   Pulse 90   Temp 98 F (36.7 C) (Oral)   Resp 20   Ht 5\' 9"  (1.753 m)   Wt 109 lb 8 oz (49.7 kg)   SpO2 100%   BMI 16.17 kg/m   HEMODYNAMICS: CVP:  [6 mmHg-19 mmHg] 19 mmHg  VENTILATOR SETTINGS:    INTAKE / OUTPUT: I/O last 3 completed shifts: In: 133 [P.O.:50; I.V.:83] Out: 1245 [Urine:310; Other:935]  PHYSICAL EXAMINATION: General: Chronically ill appearing male, NAD HEENT: [place]/AT, PERRL, EOM-I and  MMM Pulmonary: CTA bilaterally Cardiac: RRR, 2/6 SEM noted Abdomen: Soft, NT, ND and +BS Extremities: -edema and -tenderness Neuro: Awake and alert, oriented, appropriate, follows commands, no focal deficits  LABS:  BMET Recent Labs  Lab 03/02/18 0400 03/02/18 1614 03/03/18 0427  NA 135 132* 132*  K 4.9 5.8* 6.5*  CL 102 95* 92*  CO2 26 26 29   BUN 19 26* 39*  CREATININE 1.84* 2.67* 3.10*  GLUCOSE 119* 127* 100*    Electrolytes Recent Labs  Lab 03/01/18 0400  03/02/18 0400 03/02/18 0404 03/02/18 1614 03/03/18 0427  CALCIUM 9.1   < > 8.7*  --  8.6* 8.9  MG 2.4  --   --  2.5*  --  2.6*  PHOS 1.7*   < > 1.5*  --  1.9* 2.0*   < > = values in this interval not displayed.    CBC Recent Labs  Lab 03/01/18 0400 03/02/18 0404 03/03/18 0428  WBC 10.0 9.5 9.5  HGB 11.6* 10.7* 10.1*  HCT 40.1 37.0* 34.6*  PLT 258 215 181    Coag's Recent Labs  Lab 02/26/18 0200 02/27/18 0524  03/01/18 0821 03/02/18 0404 03/03/18 0428  APTT 146*  --    < > >200* 31 35  INR 1.27 1.54  --   --   --   --    < > = values in this interval not displayed.    Sepsis Markers No results for input(s): LATICACIDVEN, PROCALCITON, O2SATVEN in the last 168 hours.  ABG Recent Labs  Lab 02/26/18 1200 02/26/18 1927 02/27/18 1100  PHART 7.474* 7.561* 7.523*  PCO2ART 34.6 23.7* 26.3*  PO2ART 64.6* 113.0* 91.0    Liver Enzymes Recent Labs  Lab 03/02/18 0400 03/02/18 1614 03/03/18 0427  ALBUMIN 3.0* 2.9* 2.8*    Cardiac Enzymes No results for input(s): TROPONINI, PROBNP in the last 168 hours.  Glucose No results for input(s): GLUCAP in the last 168 hours.  Imaging I reviewed CXR myself, no acute disease noted  STUDIES:  Echocardiogram obtained on 6/19:EF 20 to 25% the cavity size was mildly dilated.  Severe diffuse hypokinesis the left atrium was severely dilated the right ventricle was severely dilated the right atrium was severely dilated.  His estimated peak arterial  systolic pressure was 45 mmHg  CULTURES:   ANTIBIOTICS:   SIGNIFICANT EVENTS: CRRT initiated 6/19 CRRT discontinued 6/24  LINES/TUBES:   DISCUSSION: 62 year old male patient with progressive nonischemic cardiomyopathy and resultant heart failure as well as volume overload.  This is been resistant to inotrope and diuresis.  Moved 6/19 to the intensive care for CRRT  ASSESSMENT / PLAN:  Acute on chronic systolic heart failure/HFrEF EF estimated 20 to 25%; in the setting of nonischemic cardiomyopathy Plan Cardiology/Heart failure team following, appreciate input D/c CRRT on 6/24 per Nephrology Continue Dobutamine 2.5 mcg per Cardiology/Heart Failure team Given low cardiac output, Beta-blockers have been deferred Per Dr. Prescott Gum note, he is intolerant to Entresto, Carvedilol, torsemide, ACE inhibitors, Bidill on hold  Hypoxia in the setting of heart failure and progressive pulmonary edema -Now weaned to room air Plan Titrate O2 for sat of 90% or higher CXR as needed at this point No sedating medications  Sedation: Appears oversedated the a.m. of 6/21.  Improved on 6/22>>Resolved 6/24 Plan Continue Xanax at current dose 0.25 mg TID prn, no increase  Acute on chronic renal failure; stage IV chronic renal disease due to polycystic renal disease.  His baseline serum creatinine is 2.8-3 Plan Nephrology following, appreciate input HD vs CRRT vs exchange resin, will defer to nephrology. Per Nephrology, pt likely progressing to ESRD and need for intermittent HD, but concern over pt's ability to tolerate HD given his poor cardiac function  Anemia of chronic disease Hemoglobin drift from 12 down to 10.9.  Suspect this is secondary to hemolysis on dialysis Plan Monitor for s/sx of bleeding Trend CBC's SQ Heparin for VTE prophylaxis Transfuse for Hgb<7  History of gout. allergic to allopurinol, prednisone, and uloric  No current evidence of flare Plan Supportive  care  Hepatitis C. Plan Off Mavyret, complete course per ID  FAMILY  - Updates: Will need to communicate with cardiology as they have known patient for a long time prior to discussing plan of care, I do not believe patient will do well and we will need to discuss code status and realistic expectations.  Will speak with Dr. Clarise Cruz prior to that however.  - Inter-disciplinary family meet or Palliative Care meeting due by:  6/26  Pt is currently stable from pulmonary standpoint.  Likely approaching need for palliative care given his possible need for HD and concerns over his ability to tolerate HD given his poor cardiac function.  Pt is noted not to be a candidate for heart or kidney transplants.  Critical Care Time devoted to patient care services described in this note is  31  Minutes. This time reflects time of care of this signee Dr Koren Bound. This critical care time does not reflect procedure time, or teaching time or supervisory  time of PA/NP/Med student/Med Resident etc but could involve care discussion time.  Rush Farmer, M.D. [facility] Pulmonary/Critical Care Medicine. Pager: [phone]. After hours pager: [phone].

## 2018-03-03 NOTE — Consult Note (Signed)
Consultation Note Date: 03/03/2018   Patient Name: Kelly Leonard  DOB: 12-Jul-1956  MRN: [id number]  Age / Sex: 62 y.o., male  PCP: Cletis Athens, MD Referring Physician: Rush Farmer, MD  Reason for Consultation: Establishing goals of care  HPI/Patient Profile: 62 y.o. male  with past medical history of polycystic kidney disease, chronic renal failure CHF (EF 25%) d/t nonischemic cardiomyopathy- admitted on 02/23/2018 with decompensated heart failure, developed cardiorenal syndrome. During admission he required CRRT and dobutamine infusion. Nephrology recommended HD but patient declined.  Palliative medicine consulted for [place].   Clinical Assessment and Goals of Care: Prior to Palliative team meeting patient and spouse met with Dr. Nelda Marseille and discussed patient's poor prognosis and GOC. Patient was made DNR at that time.   Patient and spouse very understanding that patient is at end of life. Patient states he feels somewhat "betrayed" by healthcare system that he wasn't offered heart or kidney transplant in the past before he got to this point.   However, he was able to clearly state that he did not want to die in a hospital. He wants to spend as much time at home with his family with as good quality of life as he can. He does not want dialysis.   We discussed Hospice services and philosophy. Patient and spouse agreeable to Hospice services and request [facility].   Patient is concerned about making his rent payment. I recommended he meet with Hospice SW and they may have resources for as\sistance.  We discussed symptom management- he has a dry cough, but not currently short of breath. We discussed he may become SOB in the near future and discussed using opioids and benzodiazepines to help treat this symptom, but will not fix the problem. Hospice will help manage and support him through  comfortable dying process.  Primary Decision Maker PATIENT    SUMMARY OF RECOMMENDATIONS -D/C Home with Hospice -Tussionex q12hr for cough -Oxycodone solution 51m q1hr for SOB -Lorazepam solution 144mq4hr prn anxiety or SOB -Hospice SW referral for possible financial resource assistance- patient is concerned about paying his rent     Code Status/Advance Care Planning:  DNR  Additional Recommendations (Limitations, Scope, Preferences):  Full Comfort Care  Prognosis:    < 2 weeks  Discharge Planning: Home with Hospice  Primary Diagnoses: Present on Admission: . Hypertension . CKD (chronic kidney disease) stage 4, GFR 15-29 ml/min (HCC) . Gout . ICD (implantable cardioverter-defibrillator) in place . Acute on chronic systolic CHF (congestive heart failure) ([facility]  I have reviewed the medical record, interviewed the patient and family, and examined the patient. The following aspects are pertinent.  Past Medical History:  Diagnosis Date  . Anxiety   . Bilateral renal masses    a. 01/2018 - noted on CT w/ rec for MRI to better eval.  . Chronic systolic congestive heart failure ([facility]   a. 06/2017 Echo: EF 20-25%, diff HK. Mod to sev MR, mod dil LA. Mildly dil RA. PASP 505m.  . CKD (  chronic kidney disease) stage 4, GFR 15-29 ml/min (HCC)   . COPD (chronic obstructive pulmonary disease) ([place])   . Hyperlipidemia   . Hypertension   . MI (myocardial infarction) ([place])   . Mitral regurgitation    a. 06/2017 Echo: mod - sev MR.  Marland Kitchen NICM (nonischemic cardiomyopathy) ([place])    a. 11/2005 MV: reversible inf ischemia, EF 32%; b. Notes indicate pt previously cathed - nl cors; c. 06/2015 s/p MDT [id number] Visia AF MRI VR single lead AICD ([facility]); d. 06/2017 Echo: EF 20-25%.  . Panic attacks   . Polycystic kidney    a. w/ CKD IV.   Social History   Socioeconomic History  . Marital status: Married    Spouse name: Not on file  . Number of children: Not on file  . Years of  education: Not on file  . Highest education level: Not on file  Occupational History  . Not on file  Social Needs  . Financial resource strain: Not on file  . Food insecurity:    Worry: Not on file    Inability: Not on file  . Transportation needs:    Medical: Not on file    Non-medical: Not on file  Tobacco Use  . Smoking status: Never Smoker  . Smokeless tobacco: Never Used  Substance and Sexual Activity  . Alcohol use: No  . Drug use: No  . Sexual activity: Yes  Lifestyle  . Physical activity:    Days per week: Not on file    Minutes per session: Not on file  . Stress: Not on file  Relationships  . Social connections:    Talks on phone: Not on file    Gets together: Not on file    Attends religious service: Not on file    Active member of club or organization: Not on file    Attends meetings of clubs or organizations: Not on file    Relationship status: Not on file  Other Topics Concern  . Not on file  Social History Narrative   Lives locally with wife.   Family History  Problem Relation Age of Onset  . Diabetes Mother   . Hypertension Brother   . Heart disease Maternal Grandmother   . Heart attack Maternal Grandmother    Scheduled Meds: . Chlorhexidine Gluconate Cloth  6 each Topical Daily  . chlorpheniramine-HYDROcodone  5 mL Oral Q12H  . mouth rinse  15 mL Mouth Rinse BID  . OLANZapine  2.5 mg Oral QHS   Continuous Infusions: . sodium chloride     PRN Meds:.ALPRAZolam, LORazepam, ondansetron **OR** ondansetron (ZOFRAN) IV, oxyCODONE, sodium chloride, sodium chloride flush Medications Prior to Admission:  Prior to Admission medications   Medication Sig Start Date End Date Taking? Authorizing Provider  ALPRAZolam Duanne Moron) 1 MG tablet Take 1 tablet (1 mg total) by mouth 3 (three) times daily as needed for up to 3 doses for sleep. Patient not taking: Reported on 01/21/2018 11/26/17   Gregor Hams, MD  B Complex Vitamins (B COMPLEX PO) Take 1 tablet by  mouth daily.    [provider]  bumetanide (BUMEX) 1 MG tablet Take 3 tablets (3 mg total) by mouth daily. May take additional 3 mg in the PM as needed for swelling/SOB 02/03/18   Bensimhon, Shaune Pascal, MD  busPIRone (BUSPAR) 5 MG tablet Take 1 tablet (5 mg total) 3 (three) times daily by mouth. Patient not taking: Reported on 01/21/2018 07/15/17   Shirley Friar, PA-C  chlordiazePOXIDE (LIBRIUM) 10 MG capsule Take 1 capsule (10 mg total) by mouth 4 (four) times daily as needed (agitation). 12/31/17   Carrie Mew, MD  Cholecalciferol (VITAMIN D3) 1000 units CAPS Take 1,000 Units by mouth daily.    [provider]  colchicine 0.6 MG tablet Take 0.6 mg by mouth 2 (two) times daily as needed. Gout flare up 01/09/18   [provider]  Glecaprevir-Pibrentasvir (MAVYRET) 100-40 MG TABS Take 3 tablets by mouth daily with breakfast. Patient not taking: Reported on 01/21/2018 08/27/17   Kuppelweiser, Cassie L, RPH-CPP  isosorbide-hydrALAZINE (BIDIL) 20-37.5 MG tablet Take 2 tablets 3 (three) times daily by mouth.    [provider]  KLOR-CON M20 20 MEQ tablet Take 20 mEq by mouth daily. 01/18/18   [provider]  metolazone (ZAROXOLYN) 2.5 MG tablet Take 1 tablet (2.5 mg total) by mouth as directed. For weight of 140 lbs or greater Patient not taking: Reported on 02/03/2018 11/19/17 02/17/18  Bensimhon, Shaune Pascal, MD  predniSONE (DELTASONE) 5 MG tablet Take 5 mg by mouth daily. 02/02/18   [provider]  promethazine (PHENERGAN) 25 MG tablet Take 25 mg by mouth every 6 (six) hours as needed for nausea or vomiting.    [provider]  triamcinolone ointment (KENALOG) 0.1 % Apply 1 application topically 2 (two) times daily. 02/06/18   [provider]   Allergies  Allergen Reactions  . Entresto [Sacubitril-Valsartan] Swelling    Swelling of the face  . Penicillins Anaphylaxis and Swelling    Has patient had a PCN reaction causing  immediate rash, facial/tongue/throat swelling, SOB or lightheadedness with hypotension: Yes Has patient had a PCN reaction causing severe rash involving mucus membranes or skin necrosis: No Has patient had a PCN reaction that required hospitalization: No Has patient had a PCN reaction occurring within the last 10 years: No If all of the above answers are "NO", then may proceed with Cephalosporin use.   . Seroquel [Quetiapine] Nausea And Vomiting and Hives    Hives   . Ace Inhibitors Rash  . Corlanor [Ivabradine] Swelling    Facial swelling  . Ambien [Zolpidem Tartrate] Swelling  . Bidil [Isosorb Dinitrate-Hydralazine] Hives  . Carvedilol Other (See Comments)    "dizziness, light headed, and nausea"  . Hydroxyzine Nausea Only  . Prednisone Other (See Comments)    Causes fluid build up  . Sertraline Diarrhea    itching  . Trazodone And Nefazodone Swelling and Other (See Comments)    "up for days" and lip swelling  . Uloric [Febuxostat] Other (See Comments)    Jittery, jerky, makes him lose his mind  . Benadryl [Diphenhydramine Hcl] Other (See Comments)    "makes me crazy and hyper"  . Gabapentin Swelling    Lip and face swelling  . Lorazepam Rash    Rash and severe anxiety  . Torsemide Hives, Itching, Nausea And Vomiting and Rash   Review of Systems  Constitutional: Positive for activity change, fatigue and unexpected weight change.  Respiratory: Positive for cough.   Psychiatric/Behavioral: Positive for dysphoric mood.    Physical Exam  Constitutional: He is oriented to person, place, and time.  cachetic  Cardiovascular: Normal rate and regular rhythm.  Pulmonary/Chest: Effort normal and breath sounds normal.  Neurological: He is alert and oriented to person, place, and time.  Skin: Skin is warm and dry.  Psychiatric: He has a normal mood and affect. Judgment and thought content normal.  Nursing note and vitals reviewed.  Vital Signs: BP 94/73 (BP Location: Right  Arm)   Pulse 84   Temp 98 F (36.7 C) (Oral)   Resp 18   Ht _0  (1.753 m)   Wt 49.7 kg (109 lb 8 oz)   SpO2 99%   BMI 16.17 kg/m  Pain Scale: 0-10 POSS *See Group Information*: 1-Acceptable,Awake and alert Pain Score: 0-No pain   SpO2: SpO2: 99 % O2 Device:SpO2: 99 % O2 Flow Rate: .O2 Flow Rate (L/min): 2 L/min  IO: Intake/output summary:   Intake/Output Summary (Last 24 hours) at 03/03/2018 1225 Last data filed at 03/03/2018 1000 Gross per 24 hour  Intake 47.59 ml  Output 230 ml  Net -182.41 ml    LBM: Last BM Date: 03/03/18 Baseline Weight: Weight: 66.7 kg (147 lb) Most recent weight: Weight: 49.7 kg (109 lb 8 oz)     Palliative Assessment/Data: PPS: 30%     Thank you for this consult. Palliative medicine will continue to follow and assist as needed.   Time In: 1130 Time Out: 1240 Time Total: 80 minutes Greater than 50%  of this time was spent counseling and coordinating care related to the above assessment and plan.  Signed by: Mariana Kaufman, AGNP-C Palliative Medicine    Please contact Palliative Medicine Team phone at [phone] for questions and concerns.  For individual provider: See Shea Evans

## 2018-03-03 NOTE — Progress Notes (Signed)
[facility] NEPHROLOGY PROGRESS NOTE  Assessment/ Plan: Pt is a 62 y.o. yo male with history of nonischemic cardiomyopathy EF 25% CKD stage IV due to polycystic kidney disease, admitted with decompensated congestive heart failure.  Started on CRRT on 6/19.  Assessment/Plan:  #CKD stage IV/V progressive with fluid overload on admission: Received CRRT from 6/19 to 6/24.  Patient is net negative by 20 L since admission.  He has no edema.  Patient remains anuric with serum potassium level of 7.1 today.  I have discussed with pulmonary critical care Dr.Yacoub and Cardiologist Dr. Blima Dessert regarding plan of care.  I have discussed with the patient and his wife at length today regarding poor  kidney function, anuric, severe electrolytes abnormalities including serum potassium level of 7.  I ordered a dose of Veltassa.  Patient and his wife clearly do not want to pursue any dialysis treatment.  Patient wants to remove the dialysis catheter and go home.  He understands that he may die without receiving dialysis.  Wife was also present during conversation with the patient. Given poor cardiac function, patient is a poor candidate for long term intermittent hemodialysis treatment.  I recommend palliative care and hospice care evaluation.   #Acute decompensated systolic heart failure: EF 20 to 25%.  Dobutamine off today per heart failure team.  Euvolumic.  Discussed with the heart failure team.  #Acute encephalopathy: Improved now.  #HCV: Per primary team.  Subjective: Seen and examined twice at bedside today.  Patient wanted to go home and does not want any dialysis treatment.  Currently denies chest pain or shortness of breath.  Wife at bedside.   Objective Vital signs in last 24 hours: Vitals:   03/03/18 0500 03/03/18 0600 03/03/18 0800 03/03/18 0900  BP: 99/82 (!) 72/45 94/73   Pulse:  86 90 84  Resp: 12 13 20 18   Temp:      TempSrc:      SpO2:  100% 100% 99%  Weight:       Height:       Weight change: -7.731 kg (-17 lb 0.7 oz)  Intake/Output Summary (Last 24 hours) at 03/03/2018 1010 Last data filed at 03/03/2018 1000 Gross per 24 hour  Intake 102.65 ml  Output 280 ml  Net -177.35 ml       Labs: Basic Metabolic Panel: Recent Labs  Lab 03/02/18 0400 03/02/18 1614 03/03/18 0427 03/03/18 0807  NA 135 132* 132* 130*  K 4.9 5.8* 6.5* 7.1*  CL 102 95* 92* 92*  CO2 26 26 29 27   GLUCOSE 119* 127* 100* 126*  BUN 19 26* 39* 42*  CREATININE 1.84* 2.67* 3.10* 3.49*  CALCIUM 8.7* 8.6* 8.9 9.1  PHOS 1.5* 1.9* 2.0*  --    Liver Function Tests: Recent Labs  Lab 03/02/18 0400 03/02/18 1614 03/03/18 0427  ALBUMIN 3.0* 2.9* 2.8*   No results for input(s): LIPASE, AMYLASE in the last 168 hours. No results for input(s): AMMONIA in the last 168 hours. CBC: Recent Labs  Lab 02/26/18 0200 02/27/18 0524 02/28/18 0416 03/01/18 0400 03/02/18 0404 03/03/18 0428  WBC 6.9 7.0 9.1 10.0 9.5 9.5  NEUTROABS 5.1 5.0 6.4  --   --   --   HGB 12.0* 10.9* 10.7* 11.6* 10.7* 10.1*  HCT 40.7 36.7* 36.8* 40.1 37.0* 34.6*  MCV 75.9* 73.5* 75.4* 76.4* 76.3* 75.1*  PLT 260 225 247 258 215 181   Cardiac Enzymes: Recent Labs  Lab 02/26/18 0200 02/27/18 0524  CKTOTAL 113 108  CBG: No results for input(s): GLUCAP in the last 168 hours.  Iron Studies: No results for input(s): IRON, TIBC, TRANSFERRIN, FERRITIN in the last 72 hours. Studies/Results: Dg Chest Port 1 View  Result Date: 03/03/2018 CLINICAL DATA:  Shortness of breath EXAM: PORTABLE CHEST 1 VIEW COMPARISON:  03/02/2018 FINDINGS: Cardiac shadow remains enlarged. Defibrillator is again seen. Right jugular temporary dialysis catheter is noted in satisfactory position. No pneumothorax is seen. The lungs are well aerated bilaterally without focal infiltrate or sizable effusion. IMPRESSION: No acute abnormality noted. Electronically Signed   By: Alcide Clever M.D.   On: 03/03/2018 09:19   Dg Chest Port 1  View  Result Date: 03/02/2018 CLINICAL DATA:  CHF. EXAM: PORTABLE CHEST 1 VIEW COMPARISON:  February 28, 2017 FINDINGS: The right central line is stable. No pneumothorax. A single lead AICD device is in good position. Stable cardiomegaly. The hila and mediastinum are unremarkable. Mild bibasilar opacities have improved. No other interval change. IMPRESSION: 1. Support apparatus as above. 2. Mild bibasilar opacities have improved.  No overt edema. Electronically Signed   By: Gerome Sam III M.D   On: 03/02/2018 08:09    Medications: Infusions: . DOBUTamine 2.5 mcg/kg/min (03/03/18 1000)  . sodium chloride      Scheduled Medications: . Chlorhexidine Gluconate Cloth  6 each Topical Daily  . heparin  5,000 Units Subcutaneous Q8H  . mouth rinse  15 mL Mouth Rinse BID  . OLANZapine  2.5 mg Oral QHS    have reviewed scheduled and prn medications.  Physical Exam: General: Not in distress, comfortable Heart: Rhythm S1-S2 normal Lungs: Little, no crackles Abdomen: Soft, nondistended, nontender Extremities:No edema Dialysis Access: IJ temporary catheter  Dron Prasad Bhandari 03/03/2018,10:10 AM  LOS: 7 days

## 2018-03-03 NOTE — Care Management Note (Signed)
Case Management Note Previous CM note completed by Leone Haven, RN 02/27/2018, 9:49 AM   Patient Details  Name: Kelly Leonard MRN: [id number] Date of Birth: [birth year]  Subjective/Objective:  From home with wife, presents with acute/chronic syst chf with EF 25%, stage 4 CKD, had left AMA on 6/16 from [facility] (volume overlaod), now volume overload resistant to inotrope and diuresis, sent to ICU for CRRT.                  Action/Plan: NCM will follow for dc needs.   Expected Discharge Date:  03/03/18               Expected Discharge Plan:  Home w Hospice Care  In-House Referral:     Discharge planning Services  CM Consult  Post Acute Care Choice:  Durable Medical Equipment, Hospice Choice offered to:  Patient, Spouse  DME Arranged:  Oxygen DME Agency:     HH Arranged:  Disease Management HH Agency:  [facility]  Status of Service:  Completed, signed off  If discussed at Long Length of Stay Meetings, dates discussed:    Discharge Disposition: home/home hospice   Additional Comments:  03/03/18- 1320- Laurice Kimmons RN, CM- received call from [facility] for home hospice referral. Spoke with pt and wife at bedside confirmed choice for [facility] - pt states he needs home 02 (currently on RA) but no other DME needs- pt's wife to provide transport home via private car. Verified address and phone # in epic. Gold DNR form has been completed for discharge and provided to pt/wife. Call made to [facility] for home hospice referral- spoke with Maeola Mchaney in intake. Faxed needed info to fax# [phone] via epic. They will f/u with pt/wife at home for services.    Donn Pierini [place], RN 03/03/2018, 1:32 PM [phone] 2H Transition Care Coordinator

## 2018-03-03 NOTE — Progress Notes (Signed)
Patient insisting to go home.  I spoke with Dr. Clarise Cruz and Dr. Jolaine Artist (renal).  After a long discussion with the entire health care team, there is very little that can be offered at this point.  Spoke with the patient.  After a long discussion, decision was made to make patient a full DNR with no further escalation of care.  Palliative care to see patient at noon and to arrange for home with home hospice.  Stopped dobutamine at Dr. Clarise Cruz request.  The patient is critically ill with multiple organ systems failure and requires high complexity decision making for assessment and support, frequent evaluation and titration of therapies, application of advanced monitoring technologies and extensive interpretation of multiple databases.   Critical Care Time devoted to patient care services described in this note is  60  Minutes. This time reflects time of care of this signee Dr Koren Bound. This critical care time does not reflect procedure time, or teaching time or supervisory time of PA/NP/Med student/Med Resident etc but could involve care discussion time.  Alyson Reedy, M.D. [facility] Pulmonary/Critical Care Medicine. Pager: [phone]. After hours pager: [phone].

## 2018-03-03 NOTE — Progress Notes (Signed)
Patient spoke with nephrology/CCM at length about his options, pt insistent on going home and not continuing with CRRT etc. Patient has verbalized that he understands if he leaves he will not survive.

## 2018-03-03 NOTE — Discharge Summary (Signed)
Physician Discharge Summary  Patient ID: Yoav Okane MRN: [id number] DOB/AGE: 61-29-1957 61 y.o.  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 Acute hypoxic respiratory failure Anemia of chronic disease Gout Hepatitis C   Discharge Plan: Hospice and palliative care evaluation through [facility] Further further hospice and palliative care recommendations as outpatient PRN oxycodone for pain and shortness of breath PRN Xanax for anxiety 2 L O2 for comfort As needed Tussionex for cough Continue Bumex as previously prescribed Hold BiDil                     DISCHARGE SUMMARY   62 year old male with a history of nonischemic cardiomyopathy, HFrEF (EF 20-25), chronic kidney disease stage IV secondary to polycystic disease with a baseline serum creatinine of 2.8-3 who presented to [facility] on 02/24/18 with complaints of worsening shortness of breath, progressive lower extremity edema, and scrotal and abdominal swelling for approximately 1 week.  He recently left AMA at [facility] on 02/22/18 for treatment of volume overload / decompensated CHF.  While at [facility] he required dobutamine infusion in addition to Lasix drip.    During this admission at [facility], he has required supplemental oxygen, cardiology consultation for heart failure and nephrology consultation for acute on chronic kidney failure.  He has  required dobutamine infusion and high-dose Lasix.  He was referred to ICU for poor response to Lasix and need for CRRT.  He was weaned off CRRT 03/03/18. He was unable to tolerate trials of intermittent HD due to his poor cardiac function / hypotension.  Per nephrology and cardiology patient is not a candidate for heart or kidney transplants.  Patient has now requested to stop dobutamine therapy and further dialysis trials and wants to be discharged home with hospice care.           SIGNIFICANT EVENTS CRRT initiated 6/19 CRRT discontinued 6/24   Discharge Exam: General: Chronically ill-appearing male, sitting in bed, on room air, no acute distress Neuro: Awake alert and oriented x4, follows commands, appropriate CV: Regular rate and rhythm, 2/6 systolic murmur, palpable pulses throughout, no edema PULM: Clear breath sounds throughout, no wheezing, even, nonlabored GI: Soft, tender, nondistended, bowel sounds positive x4  Extremities: No deformities, normal bulk and tone  Vitals:   03/03/18 0500 03/03/18 0600 03/03/18 0800 03/03/18 0900  BP: 99/82 (!) 72/45 94/73   Pulse:  86 90 84  Resp: _0 Temp:      TempSrc:      SpO2:  100% 100% 99%  Weight:      Height:         Discharge Labs  BMET Recent  Labs  Lab 02/27/18 0524  02/28/18 0416  03/01/18 0400 03/01/18 1658 03/02/18 0400 03/02/18 0404 03/02/18 1614 03/03/18 0427 03/03/18 0807  NA 137   < > 137   < > 135 137 135  --  132* 132* 130*  K 4.4   < > 3.8   < > 4.7 4.5 4.9  --  5.8* 6.5* 7.1*  CL 104   < > 103   < > 101 102 102  --  95* 92* 92*  CO2 23   < > 22   < > _0 --  _1 GLUCOSE 85   < > 111*   < > 123* 127* 119*  --  127* 100* 126*  BUN 40*   < > 25*   < > 21* 18 19  --  26* 39* 42*  CREATININE 2.04*   < > 1.71*   < > 2.01* 1.92* 1.84*  --  2.67* 3.10* 3.49*  CALCIUM 8.6*   < > 7.9*   < > 9.1 8.9 8.7*  --  8.6* 8.9 9.1  MG 2.3  --  2.1  --  2.4  --   --  2.5*  --  2.6*  --   PHOS 3.0   < >  1.8*   < > 1.7* 1.3* 1.5*  --  1.9* 2.0*  --    < > = values in this interval not displayed.    CBC Recent Labs  Lab 03/01/18 0400 03/02/18 0404 03/03/18 0428  HGB 11.6* 10.7* 10.1*  HCT 40.1 37.0* 34.6*  WBC 10.0 9.5 9.5  PLT 258 215 181    Anti-Coagulation Recent Labs  Lab 02/26/18 0200 02/27/18 0524  INR 1.27 1.54    Discharge Instructions    (HEART FAILURE PATIENTS) Call MD:  Anytime you have any of the following symptoms: 1) 3 pound weight gain in 24 hours or 5 pounds in 1 week 2) shortness of breath, with or without a dry hacking cough 3) swelling in the hands, feet or stomach 4) if you have to sleep on extra pillows at night in order to breathe.   Complete by:  As directed    Call MD for:  difficulty breathing, headache or visual disturbances   Complete by:  As directed    Call MD for:  extreme fatigue   Complete by:  As directed    Call MD for:  hives   Complete by:  As directed    Call MD for:  persistant dizziness or light-headedness   Complete by:  As directed    Call MD for:  persistant nausea and vomiting   Complete by:  As directed    Call MD for:  redness, tenderness, or signs of infection (pain, swelling, redness, odor or green/yellow discharge around incision site)   Complete by:  As directed    Call MD for:  severe uncontrolled pain   Complete by:  As directed    Call MD for:  temperature >100.4   Complete by:  As directed    Diet - low sodium heart healthy   Complete by:  As directed    Increase activity slowly   Complete by:  As directed        Follow-up Information    Clegg, Amy D, NP Follow up on 03/11/2018.   Specialty:  Cardiology Why:  Heart Failure Follow up at [facility]1:30-Parking at ER lot (enter under blue "[facility]" awning) or  under [place] on [facility] [address], materials entrance, garage code: [id number], elevator 1st floor). Take all am meds, bring all med bottles. Contact information: [address]  [address] [phone]        [facility] Follow up.   Specialty:  Hospice and Palliative Medicine Why:  Home Hospice referral made-They will contact you for home assessment.  If you haven't heard from them in 24 hrs, you will need to contact them. Contact information: [place] [address] [phone]            Allergies as of 03/03/2018      Reactions   Entresto [sacubitril-valsartan] Swelling   Swelling of the face   Penicillins Anaphylaxis, Swelling   Has patient had a PCN reaction causing immediate rash, facial/tongue/throat swelling, SOB or lightheadedness with hypotension: Yes Has patient had a PCN reaction causing severe rash involving mucus membranes or skin necrosis: No Has patient had a PCN reaction that required hospitalization: No Has patient had a PCN reaction occurring within the last 10 years: No If all of the above answers are "NO", then may proceed with Cephalosporin use.   Seroquel [quetiapine] Nausea And Vomiting, Hives   Hives   Ace Inhibitors Rash   Corlanor [ivabradine] Swelling   Facial swelling   Ambien [zolpidem Tartrate] Swelling   Bidil [isosorb Dinitrate-hydralazine] Hives   Carvedilol Other (See Comments)   "dizziness, light headed, and nausea"   Hydroxyzine Nausea Only   Prednisone Other (See Comments)   Causes fluid build up   Sertraline Diarrhea   itching   Trazodone And Nefazodone Swelling, Other (See Comments)   "up for days" and lip swelling   Uloric [febuxostat] Other (See Comments)   Jittery, jerky, makes him lose his mind   Benadryl [diphenhydramine Hcl] Other (See Comments)   "makes me crazy and hyper"   Gabapentin Swelling   Lip and face swelling   Lorazepam Rash   Rash and severe anxiety   Torsemide Hives, Itching, Nausea And Vomiting, Rash      Medication List    STOP taking these medications   Glecaprevir-Pibrentasvir 100-40 MG Tabs Commonly known as:  MAVYRET    isosorbide-hydrALAZINE 20-37.5 MG tablet Commonly known as:  BIDIL   KLOR-CON M20 20 MEQ tablet Generic drug:  potassium chloride SA   metolazone 2.5 MG tablet Commonly known as:  ZAROXOLYN   predniSONE 5 MG tablet Commonly known as:  DELTASONE     TAKE these medications   ALPRAZolam 1 MG tablet Commonly known as:  XANAX Take 1 tablet (1 mg total) by mouth 4 (four) times daily as needed for anxiety or sleep. What changed:    when to take this  reasons to take this   B COMPLEX PO Take 1 tablet by mouth daily.   bumetanide 1 MG tablet Commonly known as:  BUMEX Take 3 tablets (3 mg total) by mouth daily. May take additional 3 mg in the PM as needed for swelling/SOB   busPIRone 5 MG tablet Commonly known as:  BUSPAR Take 1 tablet (5 mg total) 3 (three) times daily by mouth.   chlordiazePOXIDE 10 MG capsule Commonly known as:  LIBRIUM Take 1 capsule (10 mg total) by mouth 4 (four) times daily as needed (agitation).   chlorpheniramine-HYDROcodone 10-8 MG/5ML Suer Commonly known as:  TUSSIONEX Take 5 mLs by mouth every 12 (twelve) hours.   colchicine 0.6 MG tablet Take 0.6 mg by mouth 2 (two) times daily as  needed. Gout flare up   oxyCODONE 5 MG/5ML solution Commonly known as:  ROXICODONE Take 5 mLs (5 mg total) by mouth every hour as needed (shortness of breath or pain).   promethazine 25 MG tablet Commonly known as:  PHENERGAN Take 25 mg by mouth every 6 (six) hours as needed for nausea or vomiting.   triamcinolone ointment 0.1 % Commonly known as:  KENALOG Apply 1 application topically 2 (two) times daily.   Vitamin D3 1000 units Caps Take 1,000 Units by mouth daily.            Durable Medical Equipment  (From admission, onward)        Start     Ordered   03/03/18 1345  For home use only DME oxygen  Once    Comments:  Hospice / palliative care  Question Answer Comment  Mode or (Route) Nasal cannula   Liters per Minute 2   Frequency Continuous  (stationary and portable oxygen unit needed)   Oxygen delivery system Gas      03/03/18 1345        Disposition: Home with hospice  Discharged Condition: Secundino Ellithorpe has met maximum benefit of inpatient care and is medically stable and cleared for discharge.  Patient is pending follow up as above.      Time spent on disposition:  35 Minutes.   Signed: Darel Hong, AGACNP-BC [facility] Pgr: [phone] Office: [phone]

## 2018-03-03 NOTE — Progress Notes (Signed)
Nutrition Brief Note  Chart reviewed. Per MD pt plans to transition to comfort care. Plan for palliative meeting today to determine plans for home with hospice.   No nutrition interventions planned at this time.  Please consult as needed.   Wt Readings from Last 15 Encounters:  03/03/18 109 lb 8 oz (49.7 kg)  02/23/18 146 lb 12.8 oz (66.6 kg)  02/17/18 147 lb (66.7 kg)  02/03/18 123 lb (55.8 kg)  01/25/18 123 lb 8 oz (56 kg)  01/21/18 141 lb (64 kg)  12/31/17 142 lb (64.4 kg)  12/24/17 146 lb (66.2 kg)  11/29/17 143 lb (64.9 kg)  11/26/17 137 lb (62.1 kg)  11/19/17 142 lb (64.4 kg)  10/06/17 142 lb 8 oz (64.6 kg)  10/03/17 139 lb (63 kg)  09/19/17 142 lb 6 oz (64.6 kg)  09/18/17 141 lb (64 kg)    Body mass index is 16.17 kg/m. Patient meets criteria for underweight based on current BMI.   Current diet order is Heart Healthy, patient is consuming approximately 100% of meals at this time. Labs and medications reviewed.   Kendell Bane RD, LDN, CNSC [phone] Pager [phone] After Hours Pager

## 2018-03-09 ENCOUNTER — Ambulatory Visit: Payer: Self-pay | Admitting: Internal Medicine

## 2018-03-09 DEATH — deceased

## 2018-03-11 ENCOUNTER — Encounter (HOSPITAL_COMMUNITY): Payer: Self-pay

## 2018-04-13 ENCOUNTER — Inpatient Hospital Stay (HOSPITAL_COMMUNITY): Admission: RE | Admit: 2018-04-13 | Payer: Self-pay | Source: Ambulatory Visit | Admitting: Internal Medicine

## 2018-04-20 ENCOUNTER — Other Ambulatory Visit (HOSPITAL_COMMUNITY): Payer: Self-pay | Admitting: Student

## 2018-06-06 IMAGING — US US ABDOMEN COMPLETE W/ ELASTOGRAPHY
1 series · 12 of 25 positions shown · non-contrast
Comparison: None.

CLINICAL DATA: Hepatitis-C



[Series 1: us abdomen complete w/ elastography · 0.14mm/px · 12 of 150 slices shown]
[im 7/150]
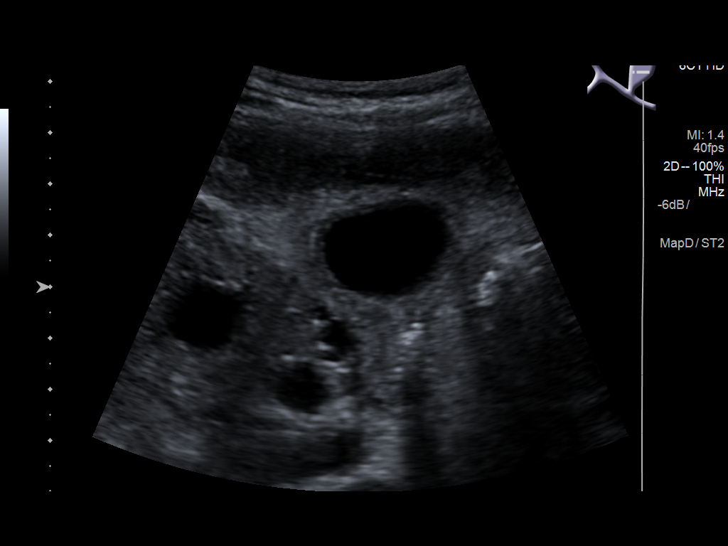
[im 19/150]
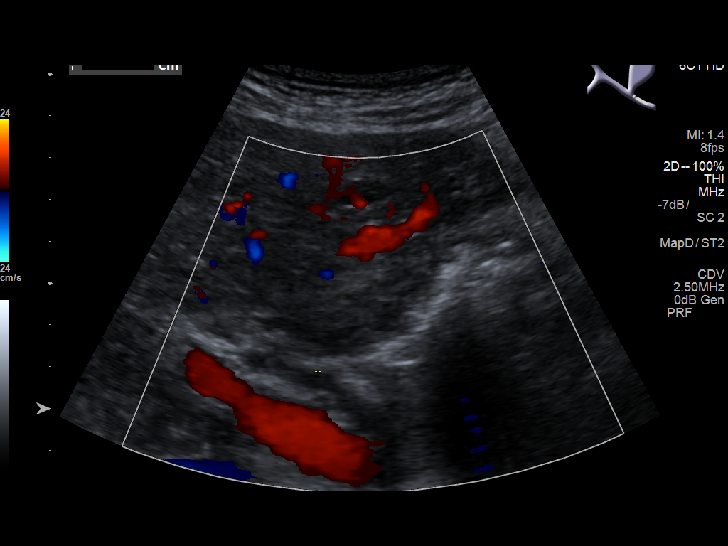
[im 32/150]
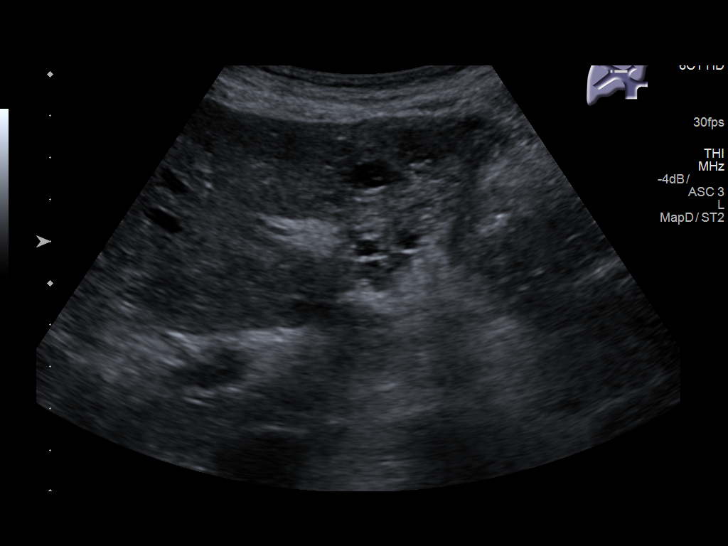
[im 44/150]
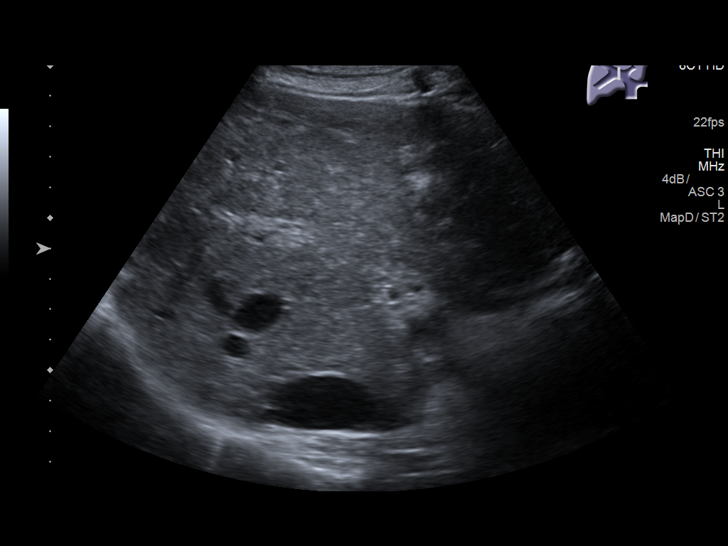
[im 56/150]
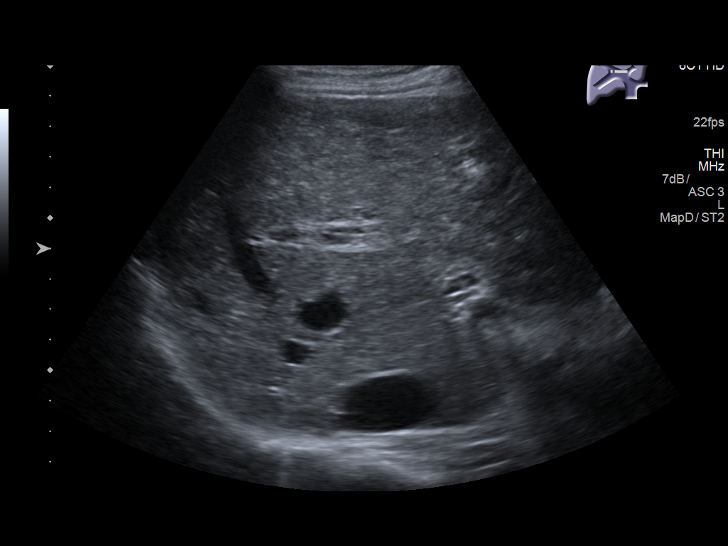
[im 69/150]
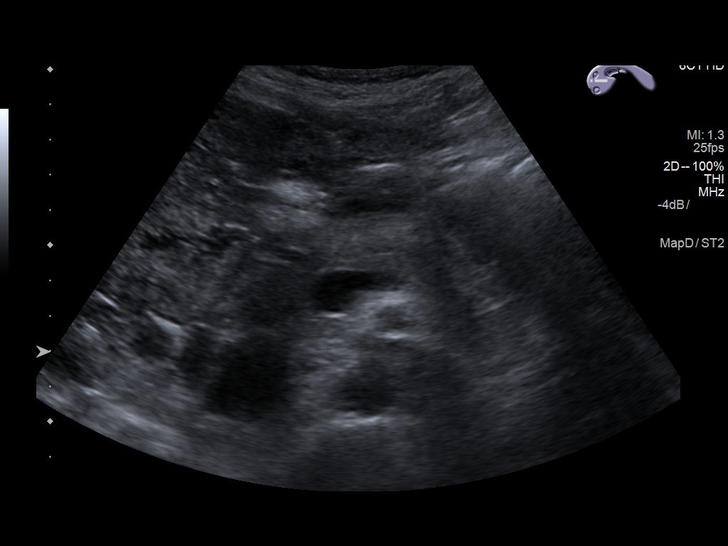
[im 81/150]
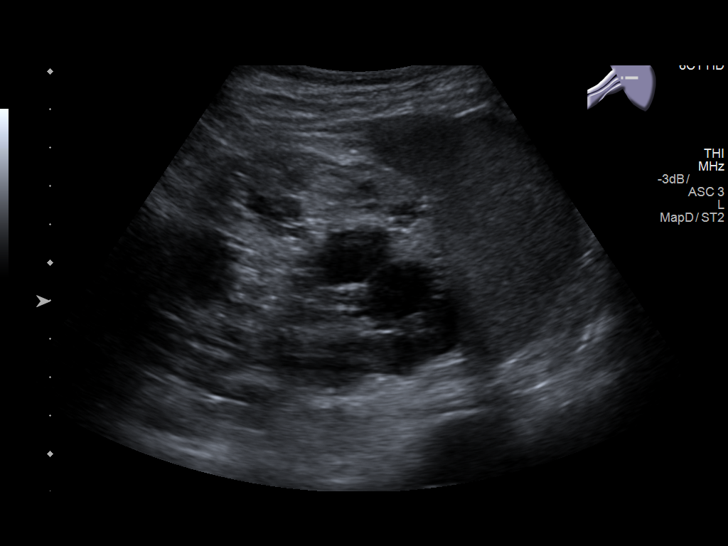
[im 94/150]
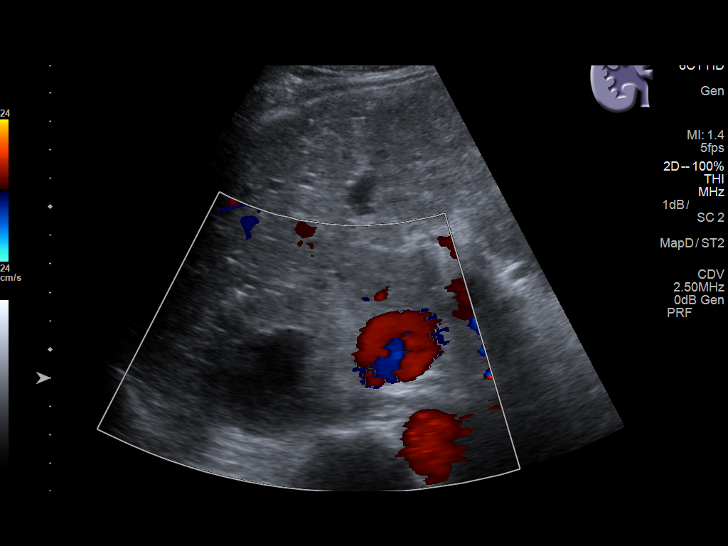
[im 106/150]
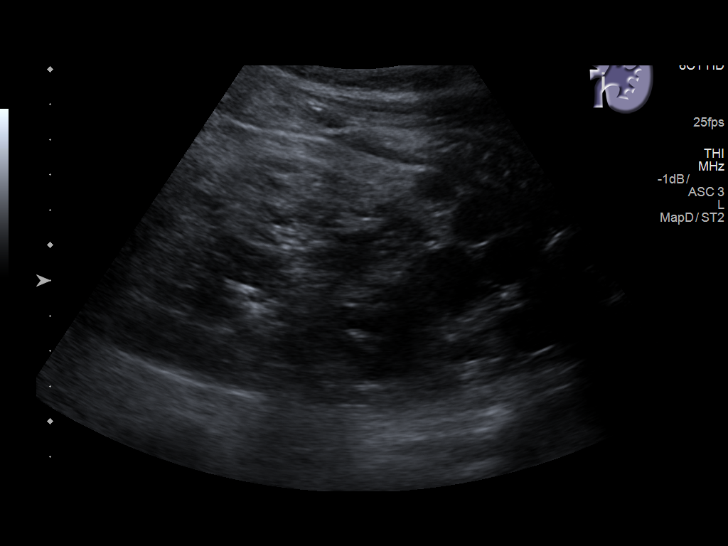
[im 118/150]
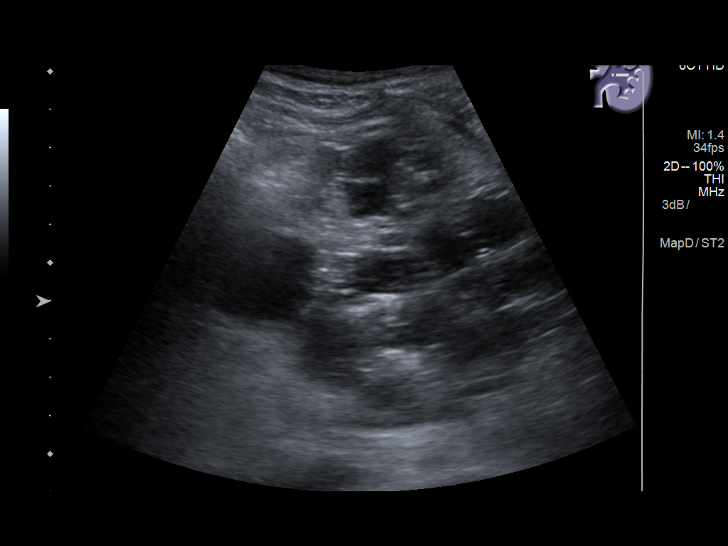
[im 131/150]
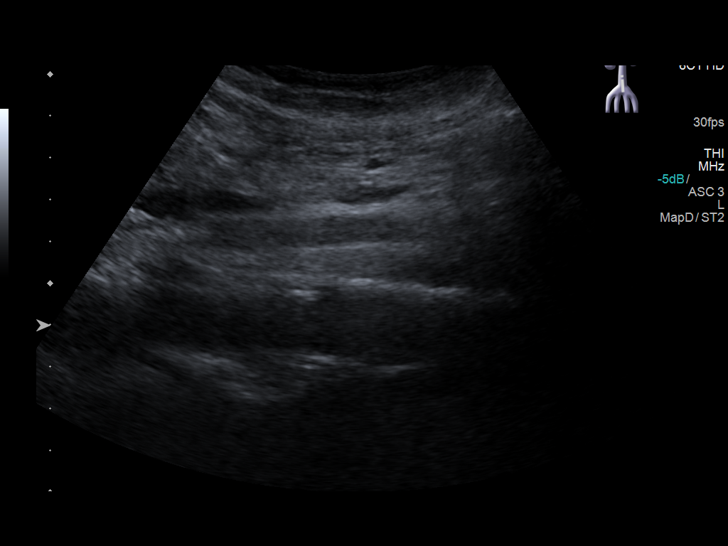
[im 143/150]
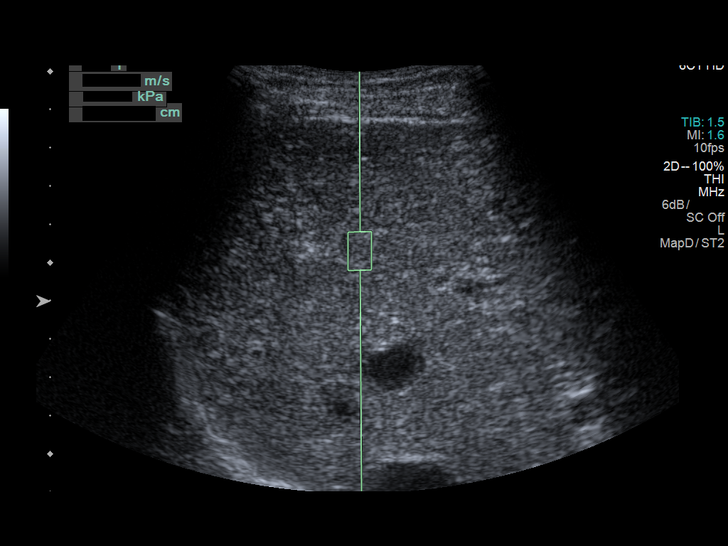

[12 of 25 positions shown; findings below may reference images not displayed]

FINDINGS: ULTRASOUND ABDOMEN

Gallbladder: Thickened gallbladder wall at 5 mm. Trace
pericholecystic fluid. No visible stones.

Common bile duct: Diameter: Normal caliber, 5 mm

Liver: Heterogeneous echotexture. Multiple benign-appearing cysts,
the largest 1.6 cm in the left lobe. This has thin internal
septations. Portal vein is patent on color Doppler imaging with
normal direction of blood flow towards the liver.

IVC: No abnormality visualized.

Pancreas: No visible focal abnormality. Mildly prominent pancreatic
duct at 3.6 mm.

Spleen: Size and appearance within normal limits.

Right Kidney: Length: 15.3 cm. Numerous cysts, the largest 4.3 cm.
Increased echotexture throughout the renal parenchyma. No
hydronephrosis.

Left Kidney: Length: 15.4 cm. Numerous cysts, the largest 3.5 cm. 6
mm midpole nonobstructing stone. Increased echotexture throughout
the left kidney. No hydronephrosis.

Abdominal aorta: No aneurysm visualized.

Other findings: None.

ULTRASOUND HEPATIC ELASTOGRAPHY

Device: Siemens Helix VTQ

Patient position: Spine

Transducer 6C1

Number of measurements: 10

Hepatic segment:  8

Median velocity:   2.54  m/sec

IQR:

IQR/Median velocity ratio:

Corresponding Metavir fibrosis score:  Some F3 + F4

Risk of fibrosis: High

Limitations of exam: None

Pertinent findings noted on other imaging exams:  None

Please note that abnormal shear wave velocities may also be
identified in clinical settings other than with hepatic fibrosis,
such as: acute hepatitis, elevated right heart and central venous
pressures including use of beta blockers, Virgenija disease
(Sravya), infiltrative processes such as
mastocytosis/amyloidosis/infiltrative tumor, extrahepatic
cholestasis, in the post-prandial state, and liver transplantation.
Correlation with patient history, laboratory data, and clinical
condition recommended.
IMPRESSION: ULTRASOUND ABDOMEN:
Heterogeneous echotexture throughout the liver. Several
benign-appearing hepatic cysts.

Numerous bilateral renal cysts with increased echotexture, most
compatible with polycystic kidney disease and chronic medical renal
disease.

ULTRASOUND HEPATIC ELASTOGRAPHY:

Median hepatic shear wave velocity is calculated at 2.54 m/sec.

Corresponding Metavir fibrosis score is  Some F3 + F4.

Risk of fibrosis is High.

Follow-up: Follow up advised

## 2018-07-11 IMAGING — DX DG CHEST 1V PORT
1 series · 1 of 1 positions shown · non-contrast
Comparison: February 28, 2017

CLINICAL DATA: CHF.

EXAM:
PORTABLE CHEST 1 VIEW

[chest ap]
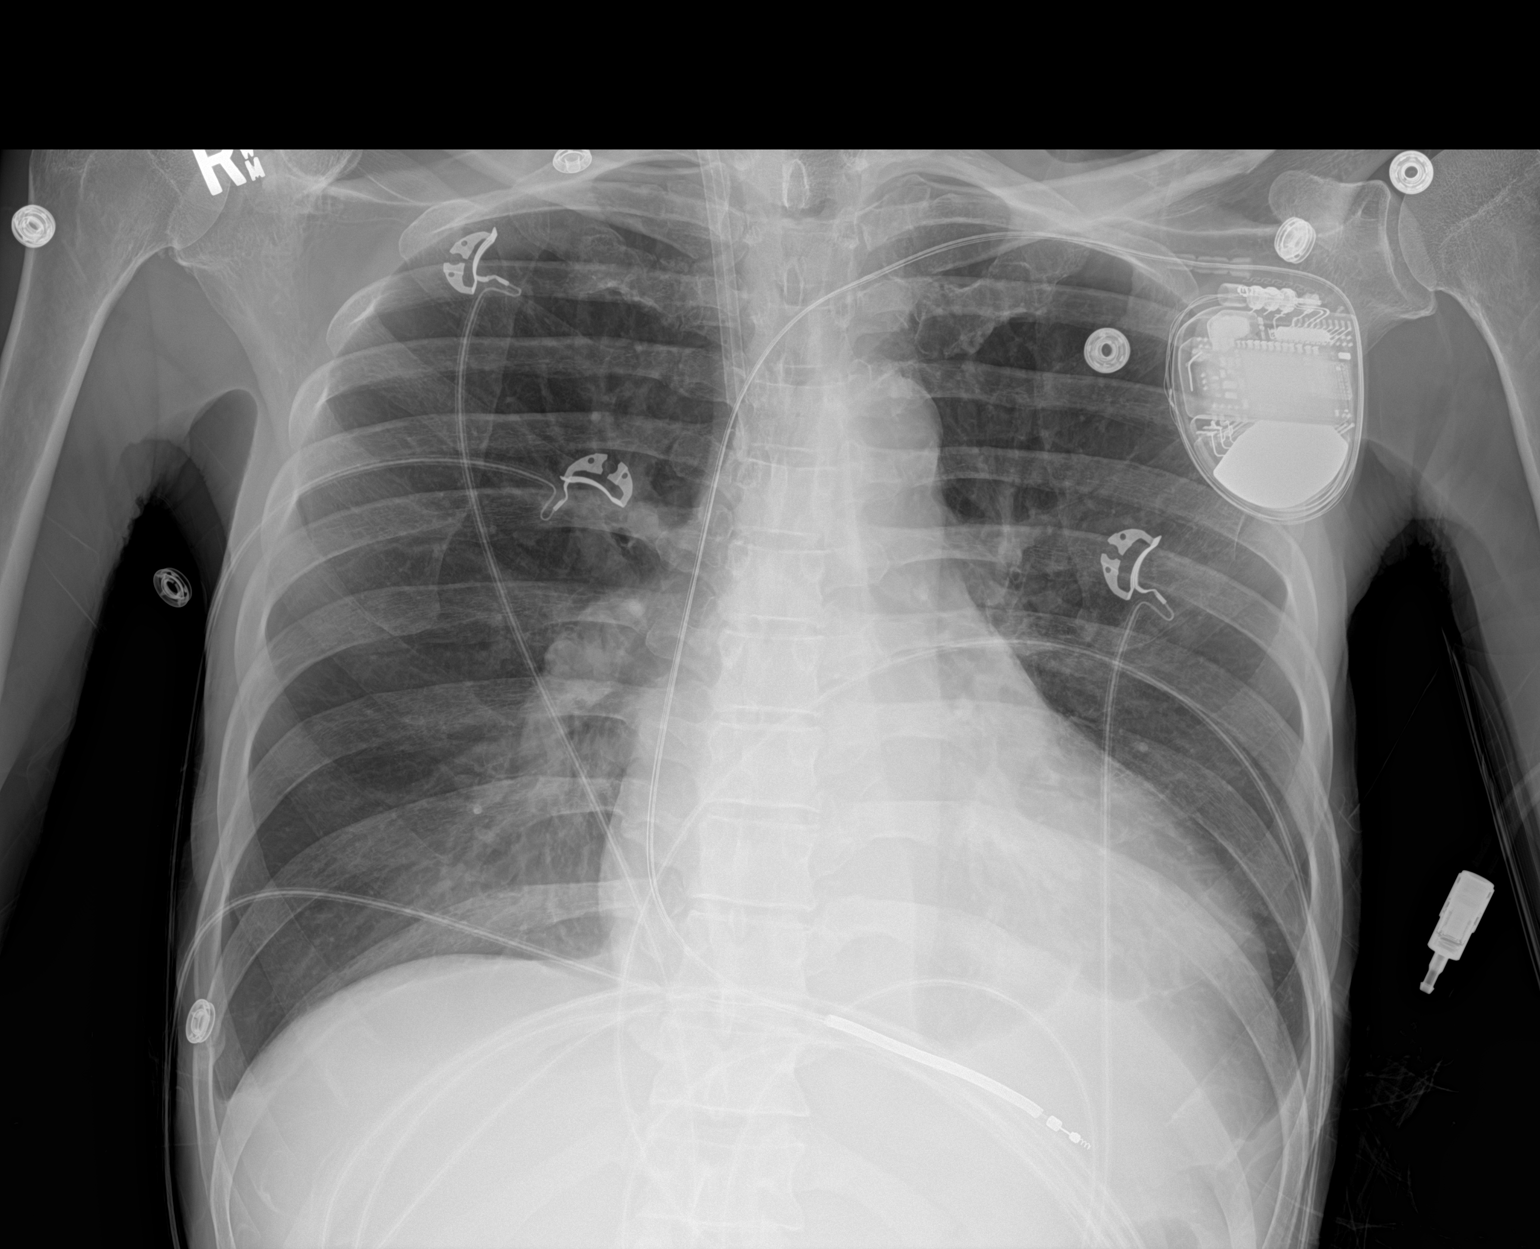

[1 of 1 positions shown; findings below may reference images not displayed]

FINDINGS: The right central line is stable. No pneumothorax. A single lead
AICD device is in good position. Stable cardiomegaly. The hila and
mediastinum are unremarkable. Mild bibasilar opacities have
improved. No other interval change.
IMPRESSION: 1. Support apparatus as above.
2. Mild bibasilar opacities have improved.  No overt edema.

## 2018-07-12 IMAGING — DX DG CHEST 1V PORT
1 series · 1 of 1 positions shown · non-contrast
Comparison: 03/02/2018

CLINICAL DATA: Shortness of breath

EXAM:
PORTABLE CHEST 1 VIEW

[chest]
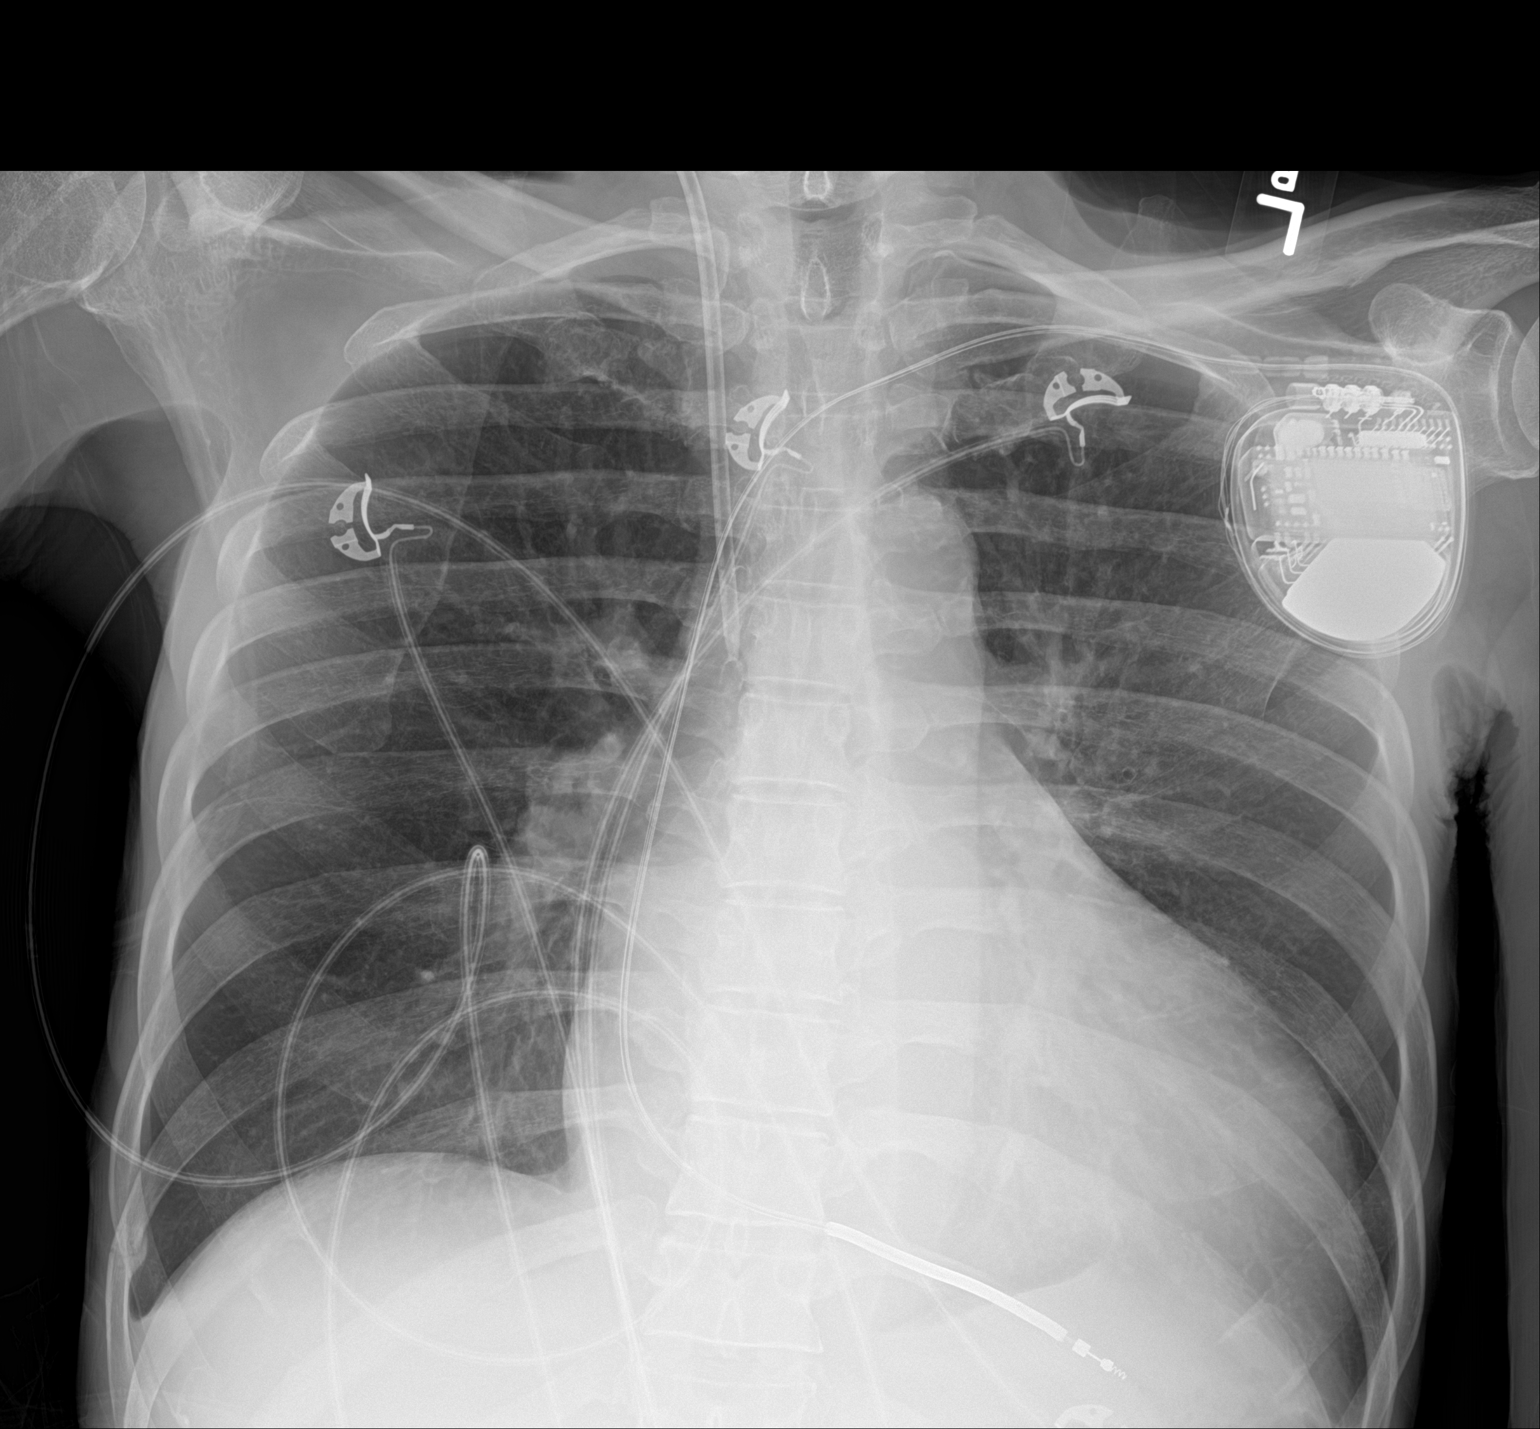

[1 of 1 positions shown; findings below may reference images not displayed]

FINDINGS: Cardiac shadow remains enlarged. Defibrillator is again seen. Right
jugular temporary dialysis catheter is noted in satisfactory
position. No pneumothorax is seen. The lungs are well aerated
bilaterally without focal infiltrate or sizable effusion.
IMPRESSION: No acute abnormality noted.
